# Patient Record
Sex: Female | Born: 1935 | Race: White | Hispanic: No | Smoking: Never smoker
Health system: Southern US, Community
[De-identification: ages and names within clinical notes are randomized; demographics above are authoritative.]

## PROBLEM LIST (undated history)

## (undated) DIAGNOSIS — I10 Essential (primary) hypertension: Secondary | ICD-10-CM

## (undated) DIAGNOSIS — M545 Low back pain, unspecified: Secondary | ICD-10-CM

## (undated) DIAGNOSIS — E559 Vitamin D deficiency, unspecified: Secondary | ICD-10-CM

## (undated) DIAGNOSIS — C801 Malignant (primary) neoplasm, unspecified: Secondary | ICD-10-CM

## (undated) DIAGNOSIS — K5731 Diverticulosis of large intestine without perforation or abscess with bleeding: Secondary | ICD-10-CM

## (undated) DIAGNOSIS — F411 Generalized anxiety disorder: Secondary | ICD-10-CM

## (undated) DIAGNOSIS — F3289 Other specified depressive episodes: Secondary | ICD-10-CM

## (undated) DIAGNOSIS — E079 Disorder of thyroid, unspecified: Secondary | ICD-10-CM

## (undated) DIAGNOSIS — Z9289 Personal history of other medical treatment: Secondary | ICD-10-CM

## (undated) DIAGNOSIS — K5730 Diverticulosis of large intestine without perforation or abscess without bleeding: Secondary | ICD-10-CM

## (undated) DIAGNOSIS — C499 Malignant neoplasm of connective and soft tissue, unspecified: Secondary | ICD-10-CM

## (undated) DIAGNOSIS — N259 Disorder resulting from impaired renal tubular function, unspecified: Secondary | ICD-10-CM

## (undated) DIAGNOSIS — D649 Anemia, unspecified: Secondary | ICD-10-CM

## (undated) DIAGNOSIS — E785 Hyperlipidemia, unspecified: Secondary | ICD-10-CM

## (undated) DIAGNOSIS — I219 Acute myocardial infarction, unspecified: Secondary | ICD-10-CM

## (undated) DIAGNOSIS — F329 Major depressive disorder, single episode, unspecified: Secondary | ICD-10-CM

## (undated) DIAGNOSIS — J45909 Unspecified asthma, uncomplicated: Secondary | ICD-10-CM

## (undated) DIAGNOSIS — M869 Osteomyelitis, unspecified: Secondary | ICD-10-CM

## (undated) DIAGNOSIS — G4700 Insomnia, unspecified: Secondary | ICD-10-CM

## (undated) DIAGNOSIS — K219 Gastro-esophageal reflux disease without esophagitis: Secondary | ICD-10-CM

## (undated) HISTORY — DX: Osteomyelitis, unspecified: M86.9

## (undated) HISTORY — DX: Other specified depressive episodes: F32.89

## (undated) HISTORY — DX: Acute myocardial infarction, unspecified: I21.9

## (undated) HISTORY — DX: Personal history of other medical treatment: Z92.89

## (undated) HISTORY — DX: Vitamin D deficiency, unspecified: E55.9

## (undated) HISTORY — DX: Unspecified asthma, uncomplicated: J45.909

## (undated) HISTORY — DX: Disorder of thyroid, unspecified: E07.9

## (undated) HISTORY — DX: Hyperlipidemia, unspecified: E78.5

## (undated) HISTORY — DX: Gastro-esophageal reflux disease without esophagitis: K21.9

## (undated) HISTORY — PX: OTHER SURGICAL HISTORY: SHX169

## (undated) HISTORY — DX: Generalized anxiety disorder: F41.1

## (undated) HISTORY — DX: Low back pain, unspecified: M54.50

## (undated) HISTORY — DX: Low back pain: M54.5

## (undated) HISTORY — DX: Malignant (primary) neoplasm, unspecified: C80.1

## (undated) HISTORY — DX: Disorder resulting from impaired renal tubular function, unspecified: N25.9

## (undated) HISTORY — DX: Diverticulosis of large intestine without perforation or abscess with bleeding: K57.31

## (undated) HISTORY — DX: Insomnia, unspecified: G47.00

## (undated) HISTORY — DX: Anemia, unspecified: D64.9

## (undated) HISTORY — DX: Diverticulosis of large intestine without perforation or abscess without bleeding: K57.30

## (undated) HISTORY — DX: Essential (primary) hypertension: I10

## (undated) HISTORY — PX: BONE MARROW BIOPSY: SHX199

## (undated) HISTORY — DX: Major depressive disorder, single episode, unspecified: F32.9

## (undated) HISTORY — DX: Malignant neoplasm of connective and soft tissue, unspecified: C49.9

---

## 1993-01-21 HISTORY — PX: PARTIAL COLECTOMY: SHX5273

## 1998-07-20 ENCOUNTER — Other Ambulatory Visit: Admission: RE | Admit: 1998-07-20 | Discharge: 1998-07-20 | Payer: Self-pay | Admitting: Obstetrics and Gynecology

## 1998-07-20 ENCOUNTER — Encounter (INDEPENDENT_AMBULATORY_CARE_PROVIDER_SITE_OTHER): Payer: Self-pay | Admitting: Specialist

## 1999-08-24 ENCOUNTER — Encounter: Admission: RE | Admit: 1999-08-24 | Discharge: 1999-08-24 | Payer: Self-pay | Admitting: Obstetrics and Gynecology

## 1999-08-24 ENCOUNTER — Encounter: Payer: Self-pay | Admitting: Obstetrics and Gynecology

## 2000-09-04 ENCOUNTER — Encounter: Admission: RE | Admit: 2000-09-04 | Discharge: 2000-09-04 | Payer: Self-pay | Admitting: Obstetrics and Gynecology

## 2000-09-04 ENCOUNTER — Encounter: Payer: Self-pay | Admitting: Obstetrics and Gynecology

## 2001-08-28 ENCOUNTER — Encounter: Payer: Self-pay | Admitting: Obstetrics and Gynecology

## 2001-08-28 ENCOUNTER — Encounter: Admission: RE | Admit: 2001-08-28 | Discharge: 2001-08-28 | Payer: Self-pay | Admitting: Obstetrics and Gynecology

## 2001-10-13 ENCOUNTER — Ambulatory Visit (HOSPITAL_COMMUNITY): Admission: RE | Admit: 2001-10-13 | Discharge: 2001-10-13 | Payer: Self-pay | Admitting: Gastroenterology

## 2002-09-02 ENCOUNTER — Encounter: Payer: Self-pay | Admitting: Obstetrics and Gynecology

## 2002-09-02 ENCOUNTER — Encounter: Admission: RE | Admit: 2002-09-02 | Discharge: 2002-09-02 | Payer: Self-pay | Admitting: Obstetrics and Gynecology

## 2003-09-20 ENCOUNTER — Encounter: Admission: RE | Admit: 2003-09-20 | Discharge: 2003-09-20 | Payer: Self-pay | Admitting: Obstetrics and Gynecology

## 2003-11-23 ENCOUNTER — Ambulatory Visit: Payer: Self-pay | Admitting: Internal Medicine

## 2003-11-30 ENCOUNTER — Ambulatory Visit: Payer: Self-pay | Admitting: Internal Medicine

## 2003-12-12 ENCOUNTER — Ambulatory Visit: Payer: Self-pay

## 2004-01-02 ENCOUNTER — Ambulatory Visit: Payer: Self-pay | Admitting: Cardiovascular Disease

## 2004-05-17 ENCOUNTER — Ambulatory Visit: Payer: Self-pay | Admitting: Cardiovascular Disease

## 2004-05-22 ENCOUNTER — Ambulatory Visit: Payer: Self-pay | Admitting: Cardiovascular Disease

## 2004-05-22 ENCOUNTER — Ambulatory Visit: Payer: Self-pay | Admitting: Internal Medicine

## 2004-11-01 ENCOUNTER — Encounter: Admission: RE | Admit: 2004-11-01 | Discharge: 2004-11-01 | Payer: Self-pay | Admitting: Obstetrics and Gynecology

## 2004-12-06 ENCOUNTER — Ambulatory Visit: Payer: Self-pay | Admitting: Cardiovascular Disease

## 2005-02-12 ENCOUNTER — Ambulatory Visit: Payer: Self-pay | Admitting: Internal Medicine

## 2005-02-14 ENCOUNTER — Ambulatory Visit: Payer: Self-pay

## 2005-02-14 ENCOUNTER — Ambulatory Visit: Payer: Self-pay | Admitting: Cardiovascular Disease

## 2005-03-01 ENCOUNTER — Ambulatory Visit (HOSPITAL_COMMUNITY): Admission: RE | Admit: 2005-03-01 | Discharge: 2005-03-01 | Payer: Self-pay | Admitting: *Deleted

## 2005-03-27 ENCOUNTER — Ambulatory Visit: Payer: Self-pay | Admitting: Internal Medicine

## 2005-04-01 ENCOUNTER — Ambulatory Visit: Payer: Self-pay | Admitting: Internal Medicine

## 2005-08-12 ENCOUNTER — Ambulatory Visit: Payer: Self-pay | Admitting: Internal Medicine

## 2005-08-23 ENCOUNTER — Ambulatory Visit: Payer: Self-pay | Admitting: Cardiovascular Disease

## 2005-09-16 ENCOUNTER — Ambulatory Visit: Payer: Self-pay | Admitting: Cardiovascular Disease

## 2005-11-04 ENCOUNTER — Encounter: Admission: RE | Admit: 2005-11-04 | Discharge: 2005-11-04 | Payer: Self-pay | Admitting: Obstetrics and Gynecology

## 2006-03-13 ENCOUNTER — Ambulatory Visit: Payer: Self-pay | Admitting: *Deleted

## 2006-10-16 ENCOUNTER — Ambulatory Visit: Payer: Self-pay | Admitting: *Deleted

## 2006-11-11 ENCOUNTER — Encounter: Admission: RE | Admit: 2006-11-11 | Discharge: 2006-11-11 | Payer: Self-pay | Admitting: Obstetrics and Gynecology

## 2006-11-14 ENCOUNTER — Ambulatory Visit: Payer: Self-pay | Admitting: Cardiovascular Disease

## 2006-11-18 ENCOUNTER — Ambulatory Visit: Payer: Self-pay

## 2006-12-01 ENCOUNTER — Inpatient Hospital Stay (HOSPITAL_COMMUNITY): Admission: RE | Admit: 2006-12-01 | Discharge: 2006-12-03 | Payer: Self-pay | Admitting: *Deleted

## 2006-12-01 ENCOUNTER — Ambulatory Visit: Payer: Self-pay | Admitting: *Deleted

## 2006-12-01 ENCOUNTER — Encounter (INDEPENDENT_AMBULATORY_CARE_PROVIDER_SITE_OTHER): Payer: Self-pay | Admitting: *Deleted

## 2006-12-11 ENCOUNTER — Ambulatory Visit: Payer: Self-pay | Admitting: *Deleted

## 2007-03-20 ENCOUNTER — Ambulatory Visit: Payer: Self-pay | Admitting: Cardiovascular Disease

## 2007-03-20 LAB — CONVERTED CEMR LAB
BUN: 35 mg/dL — ABNORMAL HIGH (ref 6–23)
Basophils Relative: 1 % (ref 0.0–1.0)
CO2: 28 meq/L (ref 19–32)
Calcium: 9.7 mg/dL (ref 8.4–10.5)
Chloride: 100 meq/L (ref 96–112)
GFR calc Af Amer: 47 mL/min
GFR calc non Af Amer: 39 mL/min
Lymphocytes Relative: 18.2 % (ref 12.0–46.0)
Monocytes Relative: 6 % (ref 3.0–11.0)
Neutro Abs: 8.5 10*3/uL — ABNORMAL HIGH (ref 1.4–7.7)
Platelets: 544 10*3/uL — ABNORMAL HIGH (ref 150–400)
RBC: 3.65 M/uL — ABNORMAL LOW (ref 3.87–5.11)

## 2007-03-25 ENCOUNTER — Ambulatory Visit: Payer: Self-pay | Admitting: Cardiology

## 2007-04-07 ENCOUNTER — Encounter: Payer: Self-pay | Admitting: Internal Medicine

## 2007-04-15 ENCOUNTER — Ambulatory Visit: Payer: Self-pay | Admitting: Oncology

## 2007-04-28 ENCOUNTER — Encounter: Payer: Self-pay | Admitting: Internal Medicine

## 2007-04-28 LAB — CBC & DIFF AND RETIC
BASO%: 0.5 % (ref 0.0–2.0)
LYMPH%: 25.2 % (ref 14.0–48.0)
MCH: 31.9 pg (ref 26.0–34.0)
MCHC: 35.7 g/dL (ref 32.0–36.0)
MCV: 89.3 fL (ref 81.0–101.0)
MONO%: 6.7 % (ref 0.0–13.0)
Platelets: 489 10*3/uL — ABNORMAL HIGH (ref 145–400)
RBC: 3.35 10*6/uL — ABNORMAL LOW (ref 3.70–5.32)
Retic %: 1.5 % (ref 0.4–2.3)

## 2007-04-28 LAB — IRON AND TIBC
%SAT: 30 % (ref 20–55)
TIBC: 345 ug/dL (ref 250–470)

## 2007-04-29 LAB — COMPREHENSIVE METABOLIC PANEL
Albumin: 4.1 g/dL (ref 3.5–5.2)
Alkaline Phosphatase: 100 U/L (ref 39–117)
BUN: 32 mg/dL — ABNORMAL HIGH (ref 6–23)
Glucose, Bld: 116 mg/dL — ABNORMAL HIGH (ref 70–99)
Total Bilirubin: 0.5 mg/dL (ref 0.3–1.2)

## 2007-04-29 LAB — LACTATE DEHYDROGENASE: LDH: 231 U/L (ref 94–250)

## 2007-04-30 LAB — TRANSFERRIN RECEPTOR, SOLUABLE: Transferrin Receptor, Soluble: 15.2 nmol/L

## 2007-05-01 LAB — IMMUNOFIXATION ELECTROPHORESIS
IgM, Serum: 75 mg/dL (ref 60–263)
Total Protein, Serum Electrophoresis: 7 g/dL (ref 6.0–8.3)

## 2007-05-12 ENCOUNTER — Emergency Department (HOSPITAL_COMMUNITY): Admission: EM | Admit: 2007-05-12 | Discharge: 2007-05-12 | Payer: Self-pay | Admitting: Emergency Medicine

## 2007-05-14 ENCOUNTER — Ambulatory Visit: Payer: Self-pay | Admitting: Oncology

## 2007-05-14 ENCOUNTER — Encounter (HOSPITAL_COMMUNITY): Payer: Self-pay | Admitting: Oncology

## 2007-05-14 ENCOUNTER — Ambulatory Visit (HOSPITAL_COMMUNITY): Admission: RE | Admit: 2007-05-14 | Discharge: 2007-05-14 | Payer: Self-pay | Admitting: Oncology

## 2007-05-18 ENCOUNTER — Ambulatory Visit: Payer: Self-pay | Admitting: Cardiovascular Disease

## 2007-05-28 ENCOUNTER — Encounter: Payer: Self-pay | Admitting: Internal Medicine

## 2007-05-28 LAB — COMPREHENSIVE METABOLIC PANEL
ALT: 18 U/L (ref 0–35)
AST: 23 U/L (ref 0–37)
Calcium: 9.2 mg/dL (ref 8.4–10.5)
Chloride: 101 mEq/L (ref 96–112)
Creatinine, Ser: 1.3 mg/dL — ABNORMAL HIGH (ref 0.40–1.20)
Potassium: 3.8 mEq/L (ref 3.5–5.3)

## 2007-05-28 LAB — CBC WITH DIFFERENTIAL/PLATELET
BASO%: 0.3 % (ref 0.0–2.0)
Basophils Absolute: 0 10*3/uL (ref 0.0–0.1)
HCT: 29.2 % — ABNORMAL LOW (ref 34.8–46.6)
HGB: 10.1 g/dL — ABNORMAL LOW (ref 11.6–15.9)
LYMPH%: 24.1 % (ref 14.0–48.0)
MCH: 31.2 pg (ref 26.0–34.0)
MCHC: 34.7 g/dL (ref 32.0–36.0)
MONO#: 0.7 10*3/uL (ref 0.1–0.9)
NEUT%: 65.2 % (ref 39.6–76.8)
Platelets: 528 10*3/uL — ABNORMAL HIGH (ref 145–400)
WBC: 9 10*3/uL (ref 3.9–10.0)
lymph#: 2.2 10*3/uL (ref 0.9–3.3)

## 2007-06-09 ENCOUNTER — Ambulatory Visit: Payer: Self-pay | Admitting: Oncology

## 2007-06-11 LAB — CBC WITH DIFFERENTIAL/PLATELET
Basophils Absolute: 0 10*3/uL (ref 0.0–0.1)
EOS%: 4.9 % (ref 0.0–7.0)
HCT: 33.7 % — ABNORMAL LOW (ref 34.8–46.6)
HGB: 11.5 g/dL — ABNORMAL LOW (ref 11.6–15.9)
MCH: 31 pg (ref 26.0–34.0)
MCV: 90.7 fL (ref 81.0–101.0)
MONO%: 8.4 % (ref 0.0–13.0)
NEUT%: 57 % (ref 39.6–76.8)
RDW: 13.9 % (ref 11.3–14.5)

## 2007-06-18 ENCOUNTER — Ambulatory Visit: Payer: Self-pay | Admitting: *Deleted

## 2007-06-23 ENCOUNTER — Ambulatory Visit: Payer: Self-pay | Admitting: Internal Medicine

## 2007-06-23 DIAGNOSIS — M545 Low back pain: Secondary | ICD-10-CM

## 2007-06-23 DIAGNOSIS — E785 Hyperlipidemia, unspecified: Secondary | ICD-10-CM | POA: Insufficient documentation

## 2007-06-23 DIAGNOSIS — K5730 Diverticulosis of large intestine without perforation or abscess without bleeding: Secondary | ICD-10-CM | POA: Insufficient documentation

## 2007-06-23 DIAGNOSIS — G4700 Insomnia, unspecified: Secondary | ICD-10-CM

## 2007-06-23 DIAGNOSIS — F411 Generalized anxiety disorder: Secondary | ICD-10-CM

## 2007-06-23 DIAGNOSIS — I739 Peripheral vascular disease, unspecified: Secondary | ICD-10-CM | POA: Insufficient documentation

## 2007-06-23 DIAGNOSIS — K219 Gastro-esophageal reflux disease without esophagitis: Secondary | ICD-10-CM

## 2007-06-23 DIAGNOSIS — I10 Essential (primary) hypertension: Secondary | ICD-10-CM | POA: Insufficient documentation

## 2007-06-23 DIAGNOSIS — D649 Anemia, unspecified: Secondary | ICD-10-CM | POA: Insufficient documentation

## 2007-06-25 ENCOUNTER — Ambulatory Visit: Payer: Self-pay | Admitting: *Deleted

## 2007-06-25 LAB — CBC WITH DIFFERENTIAL/PLATELET
BASO%: 0.6 % (ref 0.0–2.0)
Basophils Absolute: 0 10*3/uL (ref 0.0–0.1)
EOS%: 5.2 % (ref 0.0–7.0)
HCT: 37.5 % (ref 34.8–46.6)
HGB: 12.6 g/dL (ref 11.6–15.9)
LYMPH%: 34.7 % (ref 14.0–48.0)
MCH: 30.2 pg (ref 26.0–34.0)
MCHC: 33.6 g/dL (ref 32.0–36.0)
MCV: 90 fL (ref 81.0–101.0)
NEUT%: 48.5 % (ref 39.6–76.8)
Platelets: 516 10*3/uL — ABNORMAL HIGH (ref 145–400)

## 2007-07-09 LAB — CBC WITH DIFFERENTIAL/PLATELET
BASO%: 0.2 % (ref 0.0–2.0)
Basophils Absolute: 0 10*3/uL (ref 0.0–0.1)
EOS%: 2.6 % (ref 0.0–7.0)
HCT: 35 % (ref 34.8–46.6)
MCH: 30.4 pg (ref 26.0–34.0)
MCHC: 33.7 g/dL (ref 32.0–36.0)
MCV: 90.3 fL (ref 81.0–101.0)
MONO%: 6.6 % (ref 0.0–13.0)
NEUT%: 63.5 % (ref 39.6–76.8)
RDW: 13.6 % (ref 11.3–14.5)
lymph#: 1.8 10*3/uL (ref 0.9–3.3)

## 2007-07-09 LAB — COMPREHENSIVE METABOLIC PANEL
ALT: 16 U/L (ref 0–35)
Alkaline Phosphatase: 85 U/L (ref 39–117)
Creatinine, Ser: 1.42 mg/dL — ABNORMAL HIGH (ref 0.40–1.20)
Sodium: 139 mEq/L (ref 135–145)
Total Bilirubin: 0.5 mg/dL (ref 0.3–1.2)
Total Protein: 6.9 g/dL (ref 6.0–8.3)

## 2007-07-27 ENCOUNTER — Ambulatory Visit: Payer: Self-pay | Admitting: Oncology

## 2007-07-30 LAB — CBC WITH DIFFERENTIAL/PLATELET
Eosinophils Absolute: 0.2 10*3/uL (ref 0.0–0.5)
LYMPH%: 26.1 % (ref 14.0–48.0)
MONO#: 0.6 10*3/uL (ref 0.1–0.9)
NEUT#: 4.6 10*3/uL (ref 1.5–6.5)
Platelets: 407 10*3/uL — ABNORMAL HIGH (ref 145–400)
RBC: 4.37 10*6/uL (ref 3.70–5.32)
WBC: 7.3 10*3/uL (ref 3.9–10.0)
lymph#: 1.9 10*3/uL (ref 0.9–3.3)

## 2007-08-20 LAB — CBC WITH DIFFERENTIAL/PLATELET
Basophils Absolute: 0 10*3/uL (ref 0.0–0.1)
Eosinophils Absolute: 0.2 10*3/uL (ref 0.0–0.5)
HCT: 34 % — ABNORMAL LOW (ref 34.8–46.6)
HGB: 11.7 g/dL (ref 11.6–15.9)
LYMPH%: 28.9 % (ref 14.0–48.0)
MCHC: 34.5 g/dL (ref 32.0–36.0)
MONO#: 0.6 10*3/uL (ref 0.1–0.9)
NEUT#: 3.7 10*3/uL (ref 1.5–6.5)
NEUT%: 58.3 % (ref 39.6–76.8)
Platelets: 406 10*3/uL — ABNORMAL HIGH (ref 145–400)
WBC: 6.4 10*3/uL (ref 3.9–10.0)

## 2007-09-10 ENCOUNTER — Encounter: Payer: Self-pay | Admitting: Internal Medicine

## 2007-09-10 LAB — CBC WITH DIFFERENTIAL/PLATELET
Basophils Absolute: 0.1 10*3/uL (ref 0.0–0.1)
EOS%: 3 % (ref 0.0–7.0)
Eosinophils Absolute: 0.2 10*3/uL (ref 0.0–0.5)
LYMPH%: 29.5 % (ref 14.0–48.0)
MCH: 29.4 pg (ref 26.0–34.0)
MCV: 87.2 fL (ref 81.0–101.0)
MONO%: 10.2 % (ref 0.0–13.0)
NEUT#: 2.9 10*3/uL (ref 1.5–6.5)
Platelets: 443 10*3/uL — ABNORMAL HIGH (ref 145–400)
RBC: 4.47 10*6/uL (ref 3.70–5.32)
RDW: 15.5 % — ABNORMAL HIGH (ref 11.3–14.5)

## 2007-09-10 LAB — COMPREHENSIVE METABOLIC PANEL
Albumin: 4 g/dL (ref 3.5–5.2)
Alkaline Phosphatase: 94 U/L (ref 39–117)
BUN: 31 mg/dL — ABNORMAL HIGH (ref 6–23)
Glucose, Bld: 103 mg/dL — ABNORMAL HIGH (ref 70–99)
Potassium: 4.1 mEq/L (ref 3.5–5.3)
Total Bilirubin: 0.5 mg/dL (ref 0.3–1.2)

## 2007-09-29 ENCOUNTER — Ambulatory Visit: Payer: Self-pay | Admitting: Oncology

## 2007-10-01 LAB — CBC WITH DIFFERENTIAL/PLATELET
Basophils Absolute: 0 10*3/uL (ref 0.0–0.1)
EOS%: 4.5 % (ref 0.0–7.0)
Eosinophils Absolute: 0.3 10*3/uL (ref 0.0–0.5)
HCT: 35.7 % (ref 34.8–46.6)
HGB: 12.3 g/dL (ref 11.6–15.9)
LYMPH%: 27.6 % (ref 14.0–48.0)
MCH: 29.8 pg (ref 26.0–34.0)
MCV: 86.4 fL (ref 81.0–101.0)
MONO%: 6.5 % (ref 0.0–13.0)
NEUT#: 3.7 10*3/uL (ref 1.5–6.5)
NEUT%: 60.8 % (ref 39.6–76.8)
Platelets: 464 10*3/uL — ABNORMAL HIGH (ref 145–400)
RDW: 15.4 % — ABNORMAL HIGH (ref 11.3–14.5)

## 2007-10-22 LAB — CBC WITH DIFFERENTIAL/PLATELET
Eosinophils Absolute: 0.2 10*3/uL (ref 0.0–0.5)
HGB: 11.6 g/dL (ref 11.6–15.9)
MONO#: 0.4 10*3/uL (ref 0.1–0.9)
NEUT#: 3 10*3/uL (ref 1.5–6.5)
RBC: 3.89 10*6/uL (ref 3.70–5.32)
RDW: 15.3 % — ABNORMAL HIGH (ref 11.3–14.5)
WBC: 5.3 10*3/uL (ref 3.9–10.0)
lymph#: 1.7 10*3/uL (ref 0.9–3.3)

## 2007-10-28 ENCOUNTER — Ambulatory Visit: Payer: Self-pay | Admitting: Internal Medicine

## 2007-10-28 DIAGNOSIS — R5383 Other fatigue: Secondary | ICD-10-CM

## 2007-10-28 DIAGNOSIS — J45909 Unspecified asthma, uncomplicated: Secondary | ICD-10-CM

## 2007-10-28 DIAGNOSIS — R05 Cough: Secondary | ICD-10-CM

## 2007-10-28 DIAGNOSIS — N12 Tubulo-interstitial nephritis, not specified as acute or chronic: Secondary | ICD-10-CM | POA: Insufficient documentation

## 2007-10-29 LAB — CONVERTED CEMR LAB
Crystals: NEGATIVE
Hemoglobin, Urine: NEGATIVE
Nitrite: NEGATIVE
Total Protein, Urine: NEGATIVE mg/dL
Urobilinogen, UA: 0.2 (ref 0.0–1.0)

## 2007-11-02 ENCOUNTER — Ambulatory Visit: Payer: Self-pay | Admitting: Cardiovascular Disease

## 2007-11-12 ENCOUNTER — Encounter: Payer: Self-pay | Admitting: Internal Medicine

## 2007-11-12 LAB — COMPREHENSIVE METABOLIC PANEL
ALT: 15 U/L (ref 0–35)
AST: 20 U/L (ref 0–37)
Albumin: 3.8 g/dL (ref 3.5–5.2)
CO2: 23 mEq/L (ref 19–32)
Calcium: 9.5 mg/dL (ref 8.4–10.5)
Chloride: 101 mEq/L (ref 96–112)
Creatinine, Ser: 1.47 mg/dL — ABNORMAL HIGH (ref 0.40–1.20)
Potassium: 4.5 mEq/L (ref 3.5–5.3)
Total Protein: 6.8 g/dL (ref 6.0–8.3)

## 2007-11-12 LAB — CBC WITH DIFFERENTIAL/PLATELET
BASO%: 0.7 % (ref 0.0–2.0)
EOS%: 3.1 % (ref 0.0–7.0)
MCH: 30.4 pg (ref 26.0–34.0)
MCHC: 33.8 g/dL (ref 32.0–36.0)
MCV: 89.8 fL (ref 81.0–101.0)
MONO%: 8.6 % (ref 0.0–13.0)
RDW: 15.4 % — ABNORMAL HIGH (ref 11.3–14.5)
lymph#: 1.3 10*3/uL (ref 0.9–3.3)

## 2007-11-12 LAB — FERRITIN: Ferritin: 56 ng/mL (ref 10–291)

## 2007-11-12 LAB — IRON AND TIBC: TIBC: 318 ug/dL (ref 250–470)

## 2007-11-13 ENCOUNTER — Encounter: Admission: RE | Admit: 2007-11-13 | Discharge: 2007-11-13 | Payer: Self-pay | Admitting: Internal Medicine

## 2007-12-01 ENCOUNTER — Ambulatory Visit: Payer: Self-pay | Admitting: Oncology

## 2007-12-03 LAB — CBC WITH DIFFERENTIAL/PLATELET
EOS%: 4.2 % (ref 0.0–7.0)
Eosinophils Absolute: 0.2 10*3/uL (ref 0.0–0.5)
MCH: 30 pg (ref 26.0–34.0)
MCV: 91 fL (ref 81.0–101.0)
MONO%: 8 % (ref 0.0–13.0)
NEUT#: 3.3 10*3/uL (ref 1.5–6.5)
RBC: 4.37 10*6/uL (ref 3.70–5.32)
RDW: 15.3 % — ABNORMAL HIGH (ref 11.3–14.5)
lymph#: 1.6 10*3/uL (ref 0.9–3.3)

## 2007-12-21 ENCOUNTER — Ambulatory Visit: Payer: Self-pay | Admitting: Internal Medicine

## 2007-12-23 LAB — CONVERTED CEMR LAB
Bilirubin, Direct: 0.1 mg/dL (ref 0.0–0.3)
Calcium: 9.2 mg/dL (ref 8.4–10.5)
GFR calc Af Amer: 47 mL/min
Sodium: 139 meq/L (ref 135–145)
TSH: 3.3 microintl units/mL (ref 0.35–5.50)
Total Bilirubin: 0.8 mg/dL (ref 0.3–1.2)
Total Protein: 6.7 g/dL (ref 6.0–8.3)
Vit D, 1,25-Dihydroxy: 23 — ABNORMAL LOW (ref 30–89)

## 2007-12-24 LAB — CBC WITH DIFFERENTIAL/PLATELET
BASO%: 0.9 % (ref 0.0–2.0)
Basophils Absolute: 0 10*3/uL (ref 0.0–0.1)
EOS%: 5.1 % (ref 0.0–7.0)
HCT: 36.4 % (ref 34.8–46.6)
HGB: 12.4 g/dL (ref 11.6–15.9)
LYMPH%: 31.9 % (ref 14.0–48.0)
MCH: 30.3 pg (ref 26.0–34.0)
MCHC: 34 g/dL (ref 32.0–36.0)
MCV: 89 fL (ref 81.0–101.0)
MONO%: 8 % (ref 0.0–13.0)
NEUT%: 54.1 % (ref 39.6–76.8)
Platelets: 435 10*3/uL — ABNORMAL HIGH (ref 145–400)
lymph#: 1.6 10*3/uL (ref 0.9–3.3)

## 2007-12-28 ENCOUNTER — Ambulatory Visit: Payer: Self-pay | Admitting: Internal Medicine

## 2007-12-28 DIAGNOSIS — E559 Vitamin D deficiency, unspecified: Secondary | ICD-10-CM

## 2007-12-31 ENCOUNTER — Ambulatory Visit: Payer: Self-pay | Admitting: *Deleted

## 2008-01-12 ENCOUNTER — Ambulatory Visit: Payer: Self-pay | Admitting: Oncology

## 2008-01-14 LAB — CBC WITH DIFFERENTIAL/PLATELET
BASO%: 0.6 % (ref 0.0–2.0)
Basophils Absolute: 0 10*3/uL (ref 0.0–0.1)
EOS%: 5.9 % (ref 0.0–7.0)
HGB: 11.6 g/dL (ref 11.6–15.9)
MCH: 30.1 pg (ref 26.0–34.0)
MCHC: 34 g/dL (ref 32.0–36.0)
MCV: 88.7 fL (ref 81.0–101.0)
MONO%: 8.1 % (ref 0.0–13.0)
RDW: 13.8 % (ref 11.3–14.5)
lymph#: 1.5 10*3/uL (ref 0.9–3.3)

## 2008-02-03 ENCOUNTER — Encounter: Payer: Self-pay | Admitting: Internal Medicine

## 2008-02-04 ENCOUNTER — Ambulatory Visit: Payer: Self-pay | Admitting: Endocrinology

## 2008-02-04 DIAGNOSIS — IMO0002 Reserved for concepts with insufficient information to code with codable children: Secondary | ICD-10-CM | POA: Insufficient documentation

## 2008-02-04 LAB — CBC WITH DIFFERENTIAL/PLATELET
BASO%: 0.8 % (ref 0.0–2.0)
EOS%: 5 % (ref 0.0–7.0)
HCT: 40.9 % (ref 34.8–46.6)
LYMPH%: 31.9 % (ref 14.0–48.0)
MCH: 30.6 pg (ref 26.0–34.0)
MCHC: 33.6 g/dL (ref 32.0–36.0)
MCV: 91 fL (ref 81.0–101.0)
MONO%: 10.9 % (ref 0.0–13.0)
NEUT%: 51.4 % (ref 39.6–76.8)
Platelets: 415 10*3/uL — ABNORMAL HIGH (ref 145–400)
RBC: 4.49 10*6/uL (ref 3.70–5.32)

## 2008-02-04 LAB — COMPREHENSIVE METABOLIC PANEL
ALT: 15 U/L (ref 0–35)
AST: 20 U/L (ref 0–37)
Alkaline Phosphatase: 104 U/L (ref 39–117)
BUN: 25 mg/dL — ABNORMAL HIGH (ref 6–23)
Calcium: 9.7 mg/dL (ref 8.4–10.5)
Chloride: 102 mEq/L (ref 96–112)
Creatinine, Ser: 1.27 mg/dL — ABNORMAL HIGH (ref 0.40–1.20)
Total Bilirubin: 0.6 mg/dL (ref 0.3–1.2)

## 2008-02-04 LAB — LACTATE DEHYDROGENASE: LDH: 185 U/L (ref 94–250)

## 2008-02-23 ENCOUNTER — Ambulatory Visit: Payer: Self-pay | Admitting: Oncology

## 2008-02-24 ENCOUNTER — Encounter: Payer: Self-pay | Admitting: Internal Medicine

## 2008-02-25 LAB — CBC WITH DIFFERENTIAL/PLATELET
Basophils Absolute: 0 10*3/uL (ref 0.0–0.1)
Eosinophils Absolute: 0.4 10*3/uL (ref 0.0–0.5)
HGB: 12.8 g/dL (ref 11.6–15.9)
LYMPH%: 23.1 % (ref 14.0–48.0)
MCV: 89.5 fL (ref 81.0–101.0)
MONO#: 0.7 10*3/uL (ref 0.1–0.9)
MONO%: 9.3 % (ref 0.0–13.0)
NEUT#: 4.7 10*3/uL (ref 1.5–6.5)
Platelets: 405 10*3/uL — ABNORMAL HIGH (ref 145–400)
RBC: 4.22 10*6/uL (ref 3.70–5.32)
RDW: 14.1 % (ref 11.3–14.5)
WBC: 7.5 10*3/uL (ref 3.9–10.0)

## 2008-03-17 LAB — CBC WITH DIFFERENTIAL/PLATELET
Eosinophils Absolute: 0.2 10*3/uL (ref 0.0–0.5)
HCT: 34.8 % (ref 34.8–46.6)
LYMPH%: 33.7 % (ref 14.0–49.7)
MCV: 89.2 fL (ref 79.5–101.0)
MONO#: 0.5 10*3/uL (ref 0.1–0.9)
MONO%: 8 % (ref 0.0–14.0)
NEUT#: 3.1 10*3/uL (ref 1.5–6.5)
NEUT%: 53.5 % (ref 38.4–76.8)
Platelets: 429 10*3/uL — ABNORMAL HIGH (ref 145–400)
RBC: 3.91 10*6/uL (ref 3.70–5.45)
WBC: 5.9 10*3/uL (ref 3.9–10.3)

## 2008-04-07 LAB — CBC WITH DIFFERENTIAL/PLATELET
BASO%: 0.7 % (ref 0.0–2.0)
Eosinophils Absolute: 0.2 10*3/uL (ref 0.0–0.5)
HCT: 38.2 % (ref 34.8–46.6)
HGB: 12.8 g/dL (ref 11.6–15.9)
MCHC: 33.6 g/dL (ref 31.5–36.0)
MONO#: 0.4 10*3/uL (ref 0.1–0.9)
NEUT#: 3 10*3/uL (ref 1.5–6.5)
NEUT%: 60.9 % (ref 38.4–76.8)
WBC: 5 10*3/uL (ref 3.9–10.3)
lymph#: 1.4 10*3/uL (ref 0.9–3.3)

## 2008-04-11 ENCOUNTER — Ambulatory Visit: Payer: Self-pay | Admitting: Internal Medicine

## 2008-04-11 ENCOUNTER — Telehealth: Payer: Self-pay | Admitting: Internal Medicine

## 2008-04-11 ENCOUNTER — Inpatient Hospital Stay (HOSPITAL_COMMUNITY): Admission: EM | Admit: 2008-04-11 | Discharge: 2008-04-12 | Payer: Self-pay | Admitting: Emergency Medicine

## 2008-04-11 DIAGNOSIS — R55 Syncope and collapse: Secondary | ICD-10-CM

## 2008-04-11 DIAGNOSIS — E860 Dehydration: Secondary | ICD-10-CM

## 2008-04-11 DIAGNOSIS — R197 Diarrhea, unspecified: Secondary | ICD-10-CM

## 2008-04-11 DIAGNOSIS — S0510XA Contusion of eyeball and orbital tissues, unspecified eye, initial encounter: Secondary | ICD-10-CM | POA: Insufficient documentation

## 2008-04-20 ENCOUNTER — Ambulatory Visit: Payer: Self-pay

## 2008-04-26 ENCOUNTER — Ambulatory Visit: Payer: Self-pay | Admitting: Oncology

## 2008-04-26 ENCOUNTER — Ambulatory Visit: Payer: Self-pay | Admitting: Internal Medicine

## 2008-04-27 ENCOUNTER — Encounter: Payer: Self-pay | Admitting: Cardiovascular Disease

## 2008-04-27 ENCOUNTER — Ambulatory Visit: Payer: Self-pay | Admitting: Cardiovascular Disease

## 2008-04-28 LAB — CBC WITH DIFFERENTIAL/PLATELET
BASO%: 0.6 % (ref 0.0–2.0)
Basophils Absolute: 0 10e3/uL (ref 0.0–0.1)
EOS%: 5.2 % (ref 0.0–7.0)
Eosinophils Absolute: 0.3 10e3/uL (ref 0.0–0.5)
HCT: 37.6 % (ref 34.8–46.6)
HGB: 12.8 g/dL (ref 11.6–15.9)
LYMPH%: 29.7 % (ref 14.0–49.7)
MCH: 31.3 pg (ref 25.1–34.0)
MCHC: 34 g/dL (ref 31.5–36.0)
MCV: 91.9 fL (ref 79.5–101.0)
MONO#: 0.5 10e3/uL (ref 0.1–0.9)
MONO%: 8.5 % (ref 0.0–14.0)
NEUT#: 3.2 10e3/uL (ref 1.5–6.5)
NEUT%: 56 % (ref 38.4–76.8)
Platelets: 494 10e3/uL — ABNORMAL HIGH (ref 145–400)
RBC: 4.09 10e6/uL (ref 3.70–5.45)
RDW: 14.1 % (ref 11.2–14.5)
WBC: 5.8 10e3/uL (ref 3.9–10.3)
lymph#: 1.7 10e3/uL (ref 0.9–3.3)

## 2008-05-19 LAB — CBC WITH DIFFERENTIAL/PLATELET
BASO%: 0.7 % (ref 0.0–2.0)
LYMPH%: 35.5 % (ref 14.0–49.7)
MCHC: 34 g/dL (ref 31.5–36.0)
MONO#: 0.4 10*3/uL (ref 0.1–0.9)
NEUT#: 2.7 10*3/uL (ref 1.5–6.5)
RBC: 3.95 10*6/uL (ref 3.70–5.45)
RDW: 14.1 % (ref 11.2–14.5)
WBC: 5.2 10*3/uL (ref 3.9–10.3)
lymph#: 1.8 10*3/uL (ref 0.9–3.3)

## 2008-05-30 ENCOUNTER — Encounter: Payer: Self-pay | Admitting: Internal Medicine

## 2008-06-02 ENCOUNTER — Encounter: Payer: Self-pay | Admitting: Internal Medicine

## 2008-06-02 LAB — CBC WITH DIFFERENTIAL/PLATELET
Basophils Absolute: 0 10*3/uL (ref 0.0–0.1)
Eosinophils Absolute: 0.2 10*3/uL (ref 0.0–0.5)
HCT: 33.4 % — ABNORMAL LOW (ref 34.8–46.6)
HGB: 11.4 g/dL — ABNORMAL LOW (ref 11.6–15.9)
LYMPH%: 28.5 % (ref 14.0–49.7)
MCV: 91.6 fL (ref 79.5–101.0)
MONO#: 0.4 10*3/uL (ref 0.1–0.9)
MONO%: 8.9 % (ref 0.0–14.0)
NEUT#: 2.9 10*3/uL (ref 1.5–6.5)
NEUT%: 58.5 % (ref 38.4–76.8)
Platelets: 411 10*3/uL — ABNORMAL HIGH (ref 145–400)
RBC: 3.65 10*6/uL — ABNORMAL LOW (ref 3.70–5.45)
WBC: 5 10*3/uL (ref 3.9–10.3)

## 2008-06-06 LAB — COMPREHENSIVE METABOLIC PANEL
Alkaline Phosphatase: 92 U/L (ref 39–117)
BUN: 22 mg/dL (ref 6–23)
CO2: 24 mEq/L (ref 19–32)
Glucose, Bld: 89 mg/dL (ref 70–99)
Sodium: 138 mEq/L (ref 135–145)
Total Bilirubin: 0.6 mg/dL (ref 0.3–1.2)
Total Protein: 6.7 g/dL (ref 6.0–8.3)

## 2008-06-06 LAB — IRON AND TIBC
%SAT: 38 % (ref 20–55)
TIBC: 306 ug/dL (ref 250–470)
UIBC: 191 ug/dL

## 2008-06-06 LAB — LACTATE DEHYDROGENASE: LDH: 195 U/L (ref 94–250)

## 2008-06-06 LAB — FERRITIN: Ferritin: 216 ng/mL (ref 10–291)

## 2008-06-28 ENCOUNTER — Ambulatory Visit: Payer: Self-pay | Admitting: Oncology

## 2008-06-30 LAB — CBC WITH DIFFERENTIAL/PLATELET
Basophils Absolute: 0 10*3/uL (ref 0.0–0.1)
Eosinophils Absolute: 0.1 10*3/uL (ref 0.0–0.5)
HGB: 13.2 g/dL (ref 11.6–15.9)
MCV: 93.8 fL (ref 79.5–101.0)
MONO#: 0.2 10*3/uL (ref 0.1–0.9)
MONO%: 3 % (ref 0.0–14.0)
NEUT#: 4.4 10*3/uL (ref 1.5–6.5)
RBC: 4.05 10*6/uL (ref 3.70–5.45)
RDW: 14.4 % (ref 11.2–14.5)
WBC: 6.6 10*3/uL (ref 3.9–10.3)

## 2008-07-14 ENCOUNTER — Telehealth: Payer: Self-pay | Admitting: Internal Medicine

## 2008-07-19 ENCOUNTER — Ambulatory Visit: Payer: Self-pay | Admitting: Internal Medicine

## 2008-07-20 LAB — CONVERTED CEMR LAB
BUN: 22 mg/dL (ref 6–23)
CO2: 29 meq/L (ref 19–32)
Chloride: 101 meq/L (ref 96–112)
Creatinine, Ser: 1.6 mg/dL — ABNORMAL HIGH (ref 0.4–1.2)
Glucose, Bld: 100 mg/dL — ABNORMAL HIGH (ref 70–99)
TSH: 3.96 microintl units/mL (ref 0.35–5.50)

## 2008-07-21 LAB — CBC WITH DIFFERENTIAL/PLATELET
Basophils Absolute: 0 10*3/uL (ref 0.0–0.1)
Eosinophils Absolute: 0.3 10*3/uL (ref 0.0–0.5)
HGB: 12.4 g/dL (ref 11.6–15.9)
MCV: 92.7 fL (ref 79.5–101.0)
MONO#: 0.5 10*3/uL (ref 0.1–0.9)
MONO%: 6.5 % (ref 0.0–14.0)
NEUT#: 5.3 10*3/uL (ref 1.5–6.5)
RBC: 3.88 10*6/uL (ref 3.70–5.45)
RDW: 13.7 % (ref 11.2–14.5)
WBC: 7.9 10*3/uL (ref 3.9–10.3)
lymph#: 1.7 10*3/uL (ref 0.9–3.3)

## 2008-07-22 LAB — CONVERTED CEMR LAB
Bilirubin Urine: NEGATIVE
Urine Glucose: NEGATIVE mg/dL
Urobilinogen, UA: 0.2 (ref 0.0–1.0)

## 2008-07-26 ENCOUNTER — Ambulatory Visit: Payer: Self-pay | Admitting: Internal Medicine

## 2008-08-09 ENCOUNTER — Ambulatory Visit: Payer: Self-pay | Admitting: Oncology

## 2008-08-11 LAB — CBC WITH DIFFERENTIAL/PLATELET
Basophils Absolute: 0.1 10*3/uL (ref 0.0–0.1)
EOS%: 3.4 % (ref 0.0–7.0)
Eosinophils Absolute: 0.2 10*3/uL (ref 0.0–0.5)
HGB: 13.7 g/dL (ref 11.6–15.9)
MCH: 32.3 pg (ref 25.1–34.0)
NEUT#: 4 10*3/uL (ref 1.5–6.5)
RBC: 4.24 10*6/uL (ref 3.70–5.45)
RDW: 15.3 % — ABNORMAL HIGH (ref 11.2–14.5)
lymph#: 1.7 10*3/uL (ref 0.9–3.3)

## 2008-09-01 LAB — CBC WITH DIFFERENTIAL/PLATELET
Basophils Absolute: 0 10*3/uL (ref 0.0–0.1)
EOS%: 2.2 % (ref 0.0–7.0)
HGB: 12.9 g/dL (ref 11.6–15.9)
MCH: 32.1 pg (ref 25.1–34.0)
NEUT#: 7.9 10*3/uL — ABNORMAL HIGH (ref 1.5–6.5)
RBC: 4.02 10*6/uL (ref 3.70–5.45)
RDW: 13.3 % (ref 11.2–14.5)
lymph#: 1.5 10*3/uL (ref 0.9–3.3)

## 2008-09-08 ENCOUNTER — Inpatient Hospital Stay (HOSPITAL_COMMUNITY): Admission: EM | Admit: 2008-09-08 | Discharge: 2008-09-11 | Payer: Self-pay | Admitting: Emergency Medicine

## 2008-09-20 ENCOUNTER — Ambulatory Visit: Payer: Self-pay | Admitting: Internal Medicine

## 2008-09-20 ENCOUNTER — Ambulatory Visit: Payer: Self-pay | Admitting: Oncology

## 2008-09-20 DIAGNOSIS — K5731 Diverticulosis of large intestine without perforation or abscess with bleeding: Secondary | ICD-10-CM

## 2008-09-22 LAB — CBC WITH DIFFERENTIAL/PLATELET
Basophils Absolute: 0 10*3/uL (ref 0.0–0.1)
EOS%: 4.7 % (ref 0.0–7.0)
Eosinophils Absolute: 0.3 10*3/uL (ref 0.0–0.5)
LYMPH%: 27.5 % (ref 14.0–49.7)
MCH: 31.1 pg (ref 25.1–34.0)
MCV: 91.1 fL (ref 79.5–101.0)
MONO%: 9.3 % (ref 0.0–14.0)
NEUT#: 3.3 10*3/uL (ref 1.5–6.5)
Platelets: 450 10*3/uL — ABNORMAL HIGH (ref 145–400)
RBC: 3.58 10*6/uL — ABNORMAL LOW (ref 3.70–5.45)
RDW: 13.9 % (ref 11.2–14.5)

## 2008-09-27 ENCOUNTER — Encounter: Payer: Self-pay | Admitting: Internal Medicine

## 2008-09-29 ENCOUNTER — Encounter (INDEPENDENT_AMBULATORY_CARE_PROVIDER_SITE_OTHER): Payer: Self-pay | Admitting: *Deleted

## 2008-10-13 ENCOUNTER — Encounter: Payer: Self-pay | Admitting: Internal Medicine

## 2008-10-13 LAB — CBC WITH DIFFERENTIAL/PLATELET
BASO%: 0.5 % (ref 0.0–2.0)
EOS%: 3 % (ref 0.0–7.0)
LYMPH%: 26.9 % (ref 14.0–49.7)
MCHC: 33.8 g/dL (ref 31.5–36.0)
MCV: 94.9 fL (ref 79.5–101.0)
MONO%: 9.6 % (ref 0.0–14.0)
Platelets: 406 10*3/uL — ABNORMAL HIGH (ref 145–400)
RBC: 3.98 10*6/uL (ref 3.70–5.45)
RDW: 16.4 % — ABNORMAL HIGH (ref 11.2–14.5)

## 2008-10-17 LAB — COMPREHENSIVE METABOLIC PANEL
ALT: 16 U/L (ref 0–35)
AST: 22 U/L (ref 0–37)
Albumin: 3.9 g/dL (ref 3.5–5.2)
Alkaline Phosphatase: 81 U/L (ref 39–117)
Potassium: 4.2 mEq/L (ref 3.5–5.3)
Sodium: 137 mEq/L (ref 135–145)
Total Bilirubin: 0.7 mg/dL (ref 0.3–1.2)
Total Protein: 6.8 g/dL (ref 6.0–8.3)

## 2008-10-17 LAB — IRON AND TIBC
%SAT: 52 % (ref 20–55)
TIBC: 307 ug/dL (ref 250–470)

## 2008-10-17 LAB — TRANSFERRIN RECEPTOR, SOLUABLE: Transferrin Receptor, Soluble: 25.9 nmol/L

## 2008-10-18 ENCOUNTER — Telehealth: Payer: Self-pay | Admitting: Internal Medicine

## 2008-10-20 ENCOUNTER — Encounter: Payer: Self-pay | Admitting: Internal Medicine

## 2008-10-21 ENCOUNTER — Encounter: Admission: RE | Admit: 2008-10-21 | Discharge: 2008-10-21 | Payer: Self-pay | Admitting: Gastroenterology

## 2008-10-21 DIAGNOSIS — N259 Disorder resulting from impaired renal tubular function, unspecified: Secondary | ICD-10-CM

## 2008-10-21 DIAGNOSIS — K859 Acute pancreatitis without necrosis or infection, unspecified: Secondary | ICD-10-CM | POA: Insufficient documentation

## 2008-10-21 DIAGNOSIS — I2510 Atherosclerotic heart disease of native coronary artery without angina pectoris: Secondary | ICD-10-CM

## 2008-10-25 ENCOUNTER — Ambulatory Visit: Payer: Self-pay | Admitting: Cardiovascular Disease

## 2008-11-01 ENCOUNTER — Ambulatory Visit: Payer: Self-pay | Admitting: Oncology

## 2008-11-01 ENCOUNTER — Encounter: Payer: Self-pay | Admitting: Cardiovascular Disease

## 2008-11-03 LAB — CBC WITH DIFFERENTIAL/PLATELET
Basophils Absolute: 0 10*3/uL (ref 0.0–0.1)
EOS%: 3.5 % (ref 0.0–7.0)
Eosinophils Absolute: 0.2 10*3/uL (ref 0.0–0.5)
HCT: 37.8 % (ref 34.8–46.6)
HGB: 13 g/dL (ref 11.6–15.9)
MCH: 32 pg (ref 25.1–34.0)
MCV: 93.1 fL (ref 79.5–101.0)
MONO%: 9.7 % (ref 0.0–14.0)
NEUT#: 3 10*3/uL (ref 1.5–6.5)
NEUT%: 54.6 % (ref 38.4–76.8)
Platelets: 413 10*3/uL — ABNORMAL HIGH (ref 145–400)
RDW: 14.4 % (ref 11.2–14.5)

## 2008-11-07 ENCOUNTER — Encounter: Payer: Self-pay | Admitting: Internal Medicine

## 2008-11-14 ENCOUNTER — Encounter: Admission: RE | Admit: 2008-11-14 | Discharge: 2008-11-14 | Payer: Self-pay | Admitting: Internal Medicine

## 2008-11-24 LAB — CBC WITH DIFFERENTIAL/PLATELET
Basophils Absolute: 0 10*3/uL (ref 0.0–0.1)
Eosinophils Absolute: 0.2 10*3/uL (ref 0.0–0.5)
HCT: 34.5 % — ABNORMAL LOW (ref 34.8–46.6)
HGB: 11.8 g/dL (ref 11.6–15.9)
MCV: 94.9 fL (ref 79.5–101.0)
MONO%: 10.9 % (ref 0.0–14.0)
NEUT#: 2.9 10*3/uL (ref 1.5–6.5)
NEUT%: 53.7 % (ref 38.4–76.8)
Platelets: 395 10*3/uL (ref 145–400)
RDW: 14 % (ref 11.2–14.5)

## 2008-12-05 ENCOUNTER — Telehealth: Payer: Self-pay | Admitting: Internal Medicine

## 2008-12-08 ENCOUNTER — Ambulatory Visit (HOSPITAL_COMMUNITY): Admission: RE | Admit: 2008-12-08 | Discharge: 2008-12-08 | Payer: Self-pay | Admitting: General Surgery

## 2008-12-16 ENCOUNTER — Ambulatory Visit: Payer: Self-pay | Admitting: Oncology

## 2008-12-20 LAB — CBC WITH DIFFERENTIAL/PLATELET
Basophils Absolute: 0 10*3/uL (ref 0.0–0.1)
Eosinophils Absolute: 0.2 10*3/uL (ref 0.0–0.5)
HCT: 41.6 % (ref 34.8–46.6)
HGB: 14.1 g/dL (ref 11.6–15.9)
MCH: 32 pg (ref 25.1–34.0)
MONO#: 0.5 10*3/uL (ref 0.1–0.9)
NEUT#: 3.5 10*3/uL (ref 1.5–6.5)
NEUT%: 58.2 % (ref 38.4–76.8)
RDW: 14.2 % (ref 11.2–14.5)
WBC: 6.1 10*3/uL (ref 3.9–10.3)
lymph#: 1.8 10*3/uL (ref 0.9–3.3)

## 2008-12-27 ENCOUNTER — Ambulatory Visit: Payer: Self-pay | Admitting: Internal Medicine

## 2009-01-02 ENCOUNTER — Encounter: Payer: Self-pay | Admitting: Internal Medicine

## 2009-01-10 LAB — CBC WITH DIFFERENTIAL/PLATELET
Basophils Absolute: 0 10*3/uL (ref 0.0–0.1)
EOS%: 2.8 % (ref 0.0–7.0)
Eosinophils Absolute: 0.2 10*3/uL (ref 0.0–0.5)
HCT: 40.2 % (ref 34.8–46.6)
HGB: 13.7 g/dL (ref 11.6–15.9)
MONO#: 0.5 10*3/uL (ref 0.1–0.9)
NEUT#: 3.3 10*3/uL (ref 1.5–6.5)
RDW: 13.5 % (ref 11.2–14.5)
WBC: 6.1 10*3/uL (ref 3.9–10.3)
lymph#: 2.1 10*3/uL (ref 0.9–3.3)

## 2009-01-12 ENCOUNTER — Encounter: Payer: Self-pay | Admitting: Cardiovascular Disease

## 2009-01-12 ENCOUNTER — Ambulatory Visit: Payer: Self-pay

## 2009-01-12 DIAGNOSIS — R0989 Other specified symptoms and signs involving the circulatory and respiratory systems: Secondary | ICD-10-CM

## 2009-01-27 ENCOUNTER — Ambulatory Visit: Payer: Self-pay | Admitting: Oncology

## 2009-01-30 LAB — CBC WITH DIFFERENTIAL/PLATELET
Basophils Absolute: 0.1 10*3/uL (ref 0.0–0.1)
Eosinophils Absolute: 0.2 10*3/uL (ref 0.0–0.5)
HGB: 13.1 g/dL (ref 11.6–15.9)
LYMPH%: 30.8 % (ref 14.0–49.7)
MCV: 91.2 fL (ref 79.5–101.0)
MONO%: 8.3 % (ref 0.0–14.0)
NEUT#: 4.2 10*3/uL (ref 1.5–6.5)
Platelets: 385 10*3/uL (ref 145–400)

## 2009-02-13 ENCOUNTER — Encounter: Payer: Self-pay | Admitting: Cardiovascular Disease

## 2009-02-14 ENCOUNTER — Ambulatory Visit: Payer: Self-pay | Admitting: Internal Medicine

## 2009-02-14 DIAGNOSIS — J040 Acute laryngitis: Secondary | ICD-10-CM | POA: Insufficient documentation

## 2009-02-21 LAB — CBC WITH DIFFERENTIAL/PLATELET
BASO%: 0.5 % (ref 0.0–2.0)
EOS%: 2.9 % (ref 0.0–7.0)
MCH: 31.8 pg (ref 25.1–34.0)
MCHC: 34.6 g/dL (ref 31.5–36.0)
MONO#: 0.6 10*3/uL (ref 0.1–0.9)
NEUT%: 54.6 % (ref 38.4–76.8)
RBC: 3.58 10*6/uL — ABNORMAL LOW (ref 3.70–5.45)
WBC: 6.2 10*3/uL (ref 3.9–10.3)
lymph#: 2 10*3/uL (ref 0.9–3.3)

## 2009-02-22 ENCOUNTER — Telehealth: Payer: Self-pay | Admitting: Cardiovascular Disease

## 2009-03-06 ENCOUNTER — Encounter: Payer: Self-pay | Admitting: Internal Medicine

## 2009-03-10 ENCOUNTER — Ambulatory Visit: Payer: Self-pay | Admitting: Oncology

## 2009-03-14 LAB — CBC WITH DIFFERENTIAL/PLATELET
BASO%: 0.9 % (ref 0.0–2.0)
EOS%: 2.8 % (ref 0.0–7.0)
HCT: 38.2 % (ref 34.8–46.6)
HGB: 13.1 g/dL (ref 11.6–15.9)
MCH: 32.6 pg (ref 25.1–34.0)
MCHC: 34.3 g/dL (ref 31.5–36.0)
MONO#: 0.6 10*3/uL (ref 0.1–0.9)
NEUT%: 54.8 % (ref 38.4–76.8)
RDW: 15.2 % — ABNORMAL HIGH (ref 11.2–14.5)
WBC: 5.2 10*3/uL (ref 3.9–10.3)
lymph#: 1.6 10*3/uL (ref 0.9–3.3)

## 2009-03-20 ENCOUNTER — Ambulatory Visit: Payer: Self-pay | Admitting: Internal Medicine

## 2009-03-20 DIAGNOSIS — J40 Bronchitis, not specified as acute or chronic: Secondary | ICD-10-CM

## 2009-03-28 ENCOUNTER — Encounter: Payer: Self-pay | Admitting: Internal Medicine

## 2009-03-28 LAB — CBC WITH DIFFERENTIAL/PLATELET
BASO%: 0.5 % (ref 0.0–2.0)
EOS%: 3.3 % (ref 0.0–7.0)
HCT: 37.6 % (ref 34.8–46.6)
LYMPH%: 29.1 % (ref 14.0–49.7)
MCH: 31.8 pg (ref 25.1–34.0)
MCHC: 34.2 g/dL (ref 31.5–36.0)
MCV: 93 fL (ref 79.5–101.0)
MONO%: 7.3 % (ref 0.0–14.0)
NEUT%: 59.8 % (ref 38.4–76.8)
lymph#: 2 10*3/uL (ref 0.9–3.3)

## 2009-03-29 LAB — COMPREHENSIVE METABOLIC PANEL
ALT: 13 U/L (ref 0–35)
AST: 22 U/L (ref 0–37)
Alkaline Phosphatase: 89 U/L (ref 39–117)
BUN: 30 mg/dL — ABNORMAL HIGH (ref 6–23)
Chloride: 97 mEq/L (ref 96–112)
Creatinine, Ser: 1.48 mg/dL — ABNORMAL HIGH (ref 0.40–1.20)
Total Bilirubin: 0.7 mg/dL (ref 0.3–1.2)

## 2009-03-29 LAB — IRON AND TIBC
%SAT: 45 % (ref 20–55)
Iron: 132 ug/dL (ref 42–145)
TIBC: 296 ug/dL (ref 250–470)
UIBC: 164 ug/dL

## 2009-03-29 LAB — TRANSFERRIN RECEPTOR, SOLUABLE: Transferrin Receptor, Soluble: 18.7 nmol/L

## 2009-04-04 ENCOUNTER — Ambulatory Visit: Payer: Self-pay | Admitting: Internal Medicine

## 2009-04-04 LAB — CONVERTED CEMR LAB
BUN: 26 mg/dL — ABNORMAL HIGH (ref 6–23)
CO2: 30 meq/L (ref 19–32)
Calcium: 9.2 mg/dL (ref 8.4–10.5)
Creatinine, Ser: 1.4 mg/dL — ABNORMAL HIGH (ref 0.4–1.2)
Eosinophils Absolute: 0.2 10*3/uL (ref 0.0–0.7)
Glucose, Bld: 96 mg/dL (ref 70–99)
HCT: 37.8 % (ref 36.0–46.0)
Lymphs Abs: 1.8 10*3/uL (ref 0.7–4.0)
MCHC: 33.2 g/dL (ref 30.0–36.0)
MCV: 95.8 fL (ref 78.0–100.0)
Monocytes Absolute: 0.5 10*3/uL (ref 0.1–1.0)
Neutrophils Relative %: 55.9 % (ref 43.0–77.0)
Platelets: 486 10*3/uL — ABNORMAL HIGH (ref 150.0–400.0)

## 2009-04-10 ENCOUNTER — Ambulatory Visit: Payer: Self-pay | Admitting: Internal Medicine

## 2009-04-11 ENCOUNTER — Telehealth: Payer: Self-pay | Admitting: Cardiovascular Disease

## 2009-04-21 ENCOUNTER — Ambulatory Visit: Payer: Self-pay | Admitting: Oncology

## 2009-04-24 ENCOUNTER — Ambulatory Visit: Payer: Self-pay | Admitting: Cardiovascular Disease

## 2009-04-25 LAB — CBC WITH DIFFERENTIAL/PLATELET
Eosinophils Absolute: 0.2 10*3/uL (ref 0.0–0.5)
HCT: 32.8 % — ABNORMAL LOW (ref 34.8–46.6)
HGB: 11.1 g/dL — ABNORMAL LOW (ref 11.6–15.9)
LYMPH%: 29.5 % (ref 14.0–49.7)
MONO#: 0.5 10*3/uL (ref 0.1–0.9)
NEUT#: 3.6 10*3/uL (ref 1.5–6.5)
NEUT%: 58 % (ref 38.4–76.8)
Platelets: 397 10*3/uL (ref 145–400)
WBC: 6.2 10*3/uL (ref 3.9–10.3)

## 2009-05-08 ENCOUNTER — Encounter: Payer: Self-pay | Admitting: Internal Medicine

## 2009-05-16 LAB — CBC WITH DIFFERENTIAL/PLATELET
BASO%: 0.6 % (ref 0.0–2.0)
Basophils Absolute: 0 10*3/uL (ref 0.0–0.1)
HCT: 38.9 % (ref 34.8–46.6)
LYMPH%: 32.6 % (ref 14.0–49.7)
MCH: 32.7 pg (ref 25.1–34.0)
MCHC: 34 g/dL (ref 31.5–36.0)
MONO#: 0.5 10*3/uL (ref 0.1–0.9)
NEUT%: 54.9 % (ref 38.4–76.8)
Platelets: 436 10*3/uL — ABNORMAL HIGH (ref 145–400)
WBC: 5.9 10*3/uL (ref 3.9–10.3)

## 2009-06-05 ENCOUNTER — Ambulatory Visit: Payer: Self-pay | Admitting: Oncology

## 2009-06-20 LAB — CBC WITH DIFFERENTIAL/PLATELET
Eosinophils Absolute: 0.2 10*3/uL (ref 0.0–0.5)
MONO#: 0.6 10*3/uL (ref 0.1–0.9)
NEUT#: 3.3 10*3/uL (ref 1.5–6.5)
Platelets: 377 10*3/uL (ref 145–400)
RBC: 3.73 10*6/uL (ref 3.70–5.45)
RDW: 13.4 % (ref 11.2–14.5)
WBC: 6 10*3/uL (ref 3.9–10.3)

## 2009-07-07 ENCOUNTER — Ambulatory Visit: Payer: Self-pay | Admitting: Oncology

## 2009-07-11 LAB — CBC WITH DIFFERENTIAL/PLATELET
Basophils Absolute: 0 10*3/uL (ref 0.0–0.1)
Eosinophils Absolute: 0.2 10*3/uL (ref 0.0–0.5)
HCT: 38 % (ref 34.8–46.6)
HGB: 13.1 g/dL (ref 11.6–15.9)
MCH: 32.2 pg (ref 25.1–34.0)
MONO#: 0.4 10*3/uL (ref 0.1–0.9)
NEUT#: 2.6 10*3/uL (ref 1.5–6.5)
NEUT%: 53.9 % (ref 38.4–76.8)
WBC: 4.9 10*3/uL (ref 3.9–10.3)
lymph#: 1.6 10*3/uL (ref 0.9–3.3)

## 2009-08-01 LAB — CBC WITH DIFFERENTIAL/PLATELET
BASO%: 0.4 % (ref 0.0–2.0)
HCT: 35.1 % (ref 34.8–46.6)
MCHC: 34.7 g/dL (ref 31.5–36.0)
MONO#: 0.5 10*3/uL (ref 0.1–0.9)
NEUT%: 54.4 % (ref 38.4–76.8)
RDW: 13.6 % (ref 11.2–14.5)
WBC: 5.7 10*3/uL (ref 3.9–10.3)
lymph#: 1.8 10*3/uL (ref 0.9–3.3)

## 2009-08-03 ENCOUNTER — Ambulatory Visit: Payer: Self-pay | Admitting: Internal Medicine

## 2009-08-03 LAB — CONVERTED CEMR LAB
BUN: 24 mg/dL — ABNORMAL HIGH (ref 6–23)
Calcium: 9.6 mg/dL (ref 8.4–10.5)
GFR calc non Af Amer: 43.05 mL/min (ref >60)
Potassium: 4.4 meq/L (ref 3.5–5.1)
Sodium: 137 meq/L (ref 135–145)

## 2009-08-07 ENCOUNTER — Ambulatory Visit: Payer: Self-pay | Admitting: Internal Medicine

## 2009-08-17 ENCOUNTER — Telehealth: Payer: Self-pay | Admitting: Internal Medicine

## 2009-08-18 ENCOUNTER — Ambulatory Visit: Payer: Self-pay | Admitting: Oncology

## 2009-08-22 ENCOUNTER — Encounter: Payer: Self-pay | Admitting: Internal Medicine

## 2009-08-22 LAB — CBC WITH DIFFERENTIAL/PLATELET
Basophils Absolute: 0 10*3/uL (ref 0.0–0.1)
Eosinophils Absolute: 0.1 10*3/uL (ref 0.0–0.5)
HGB: 13.6 g/dL (ref 11.6–15.9)
MCV: 94.3 fL (ref 79.5–101.0)
MONO#: 0.5 10*3/uL (ref 0.1–0.9)
MONO%: 6.5 % (ref 0.0–14.0)
NEUT#: 5.6 10*3/uL (ref 1.5–6.5)
RBC: 4.31 10*6/uL (ref 3.70–5.45)
RDW: 14.9 % — ABNORMAL HIGH (ref 11.2–14.5)
WBC: 7.8 10*3/uL (ref 3.9–10.3)

## 2009-08-23 LAB — TRANSFERRIN RECEPTOR, SOLUABLE: Transferrin Receptor, Soluble: 31.7 nmol/L

## 2009-08-23 LAB — COMPREHENSIVE METABOLIC PANEL
ALT: 15 U/L (ref 0–35)
AST: 23 U/L (ref 0–37)
Alkaline Phosphatase: 96 U/L (ref 39–117)
Creatinine, Ser: 1.46 mg/dL — ABNORMAL HIGH (ref 0.40–1.20)
Total Bilirubin: 0.8 mg/dL (ref 0.3–1.2)

## 2009-08-23 LAB — IRON AND TIBC
TIBC: 341 ug/dL (ref 250–470)
UIBC: 214 ug/dL

## 2009-08-23 LAB — FERRITIN: Ferritin: 110 ng/mL (ref 10–291)

## 2009-08-23 LAB — LACTATE DEHYDROGENASE: LDH: 194 U/L (ref 94–250)

## 2009-09-12 LAB — CBC WITH DIFFERENTIAL/PLATELET
BASO%: 0.5 % (ref 0.0–2.0)
LYMPH%: 29.5 % (ref 14.0–49.7)
MCHC: 34.6 g/dL (ref 31.5–36.0)
MONO#: 0.6 10*3/uL (ref 0.1–0.9)
Platelets: 420 10*3/uL — ABNORMAL HIGH (ref 145–400)
RBC: 3.93 10*6/uL (ref 3.70–5.45)
WBC: 7.4 10*3/uL (ref 3.9–10.3)
lymph#: 2.2 10*3/uL (ref 0.9–3.3)

## 2009-09-28 ENCOUNTER — Telehealth: Payer: Self-pay | Admitting: Cardiovascular Disease

## 2009-09-29 ENCOUNTER — Ambulatory Visit: Payer: Self-pay | Admitting: Oncology

## 2009-10-03 LAB — CBC WITH DIFFERENTIAL/PLATELET
Basophils Absolute: 0 10*3/uL (ref 0.0–0.1)
EOS%: 2.6 % (ref 0.0–7.0)
Eosinophils Absolute: 0.2 10*3/uL (ref 0.0–0.5)
HGB: 11.8 g/dL (ref 11.6–15.9)
MCH: 30.5 pg (ref 25.1–34.0)
NEUT#: 3.2 10*3/uL (ref 1.5–6.5)
RBC: 3.86 10*6/uL (ref 3.70–5.45)
RDW: 13.8 % (ref 11.2–14.5)
lymph#: 1.8 10*3/uL (ref 0.9–3.3)

## 2009-10-30 ENCOUNTER — Encounter: Payer: Self-pay | Admitting: Cardiovascular Disease

## 2009-10-31 ENCOUNTER — Ambulatory Visit: Payer: Self-pay

## 2009-10-31 ENCOUNTER — Ambulatory Visit: Payer: Self-pay | Admitting: Cardiovascular Disease

## 2009-11-02 ENCOUNTER — Ambulatory Visit: Payer: Self-pay | Admitting: Internal Medicine

## 2009-11-10 ENCOUNTER — Ambulatory Visit: Payer: Self-pay | Admitting: Oncology

## 2009-11-14 LAB — CBC WITH DIFFERENTIAL/PLATELET
BASO%: 0.6 % (ref 0.0–2.0)
Basophils Absolute: 0 10*3/uL (ref 0.0–0.1)
HCT: 34.9 % (ref 34.8–46.6)
LYMPH%: 32.3 % (ref 14.0–49.7)
MCH: 32.4 pg (ref 25.1–34.0)
MCHC: 34.5 g/dL (ref 31.5–36.0)
MONO#: 0.6 10*3/uL (ref 0.1–0.9)
NEUT%: 53.8 % (ref 38.4–76.8)
Platelets: 482 10*3/uL — ABNORMAL HIGH (ref 145–400)
WBC: 6.1 10*3/uL (ref 3.9–10.3)

## 2009-11-15 ENCOUNTER — Encounter: Admission: RE | Admit: 2009-11-15 | Discharge: 2009-11-15 | Payer: Self-pay | Admitting: Internal Medicine

## 2009-11-15 LAB — HM MAMMOGRAPHY: HM Mammogram: NEGATIVE

## 2009-11-25 IMAGING — CR DG ABDOMEN 1V
2 series · 2 of 2 positions shown · non-contrast
Comparison: None

CLINICAL DATA: Query foreign body right colon.

ABDOMEN - 1 VIEW

[t abdomen supine (1 of 2)]
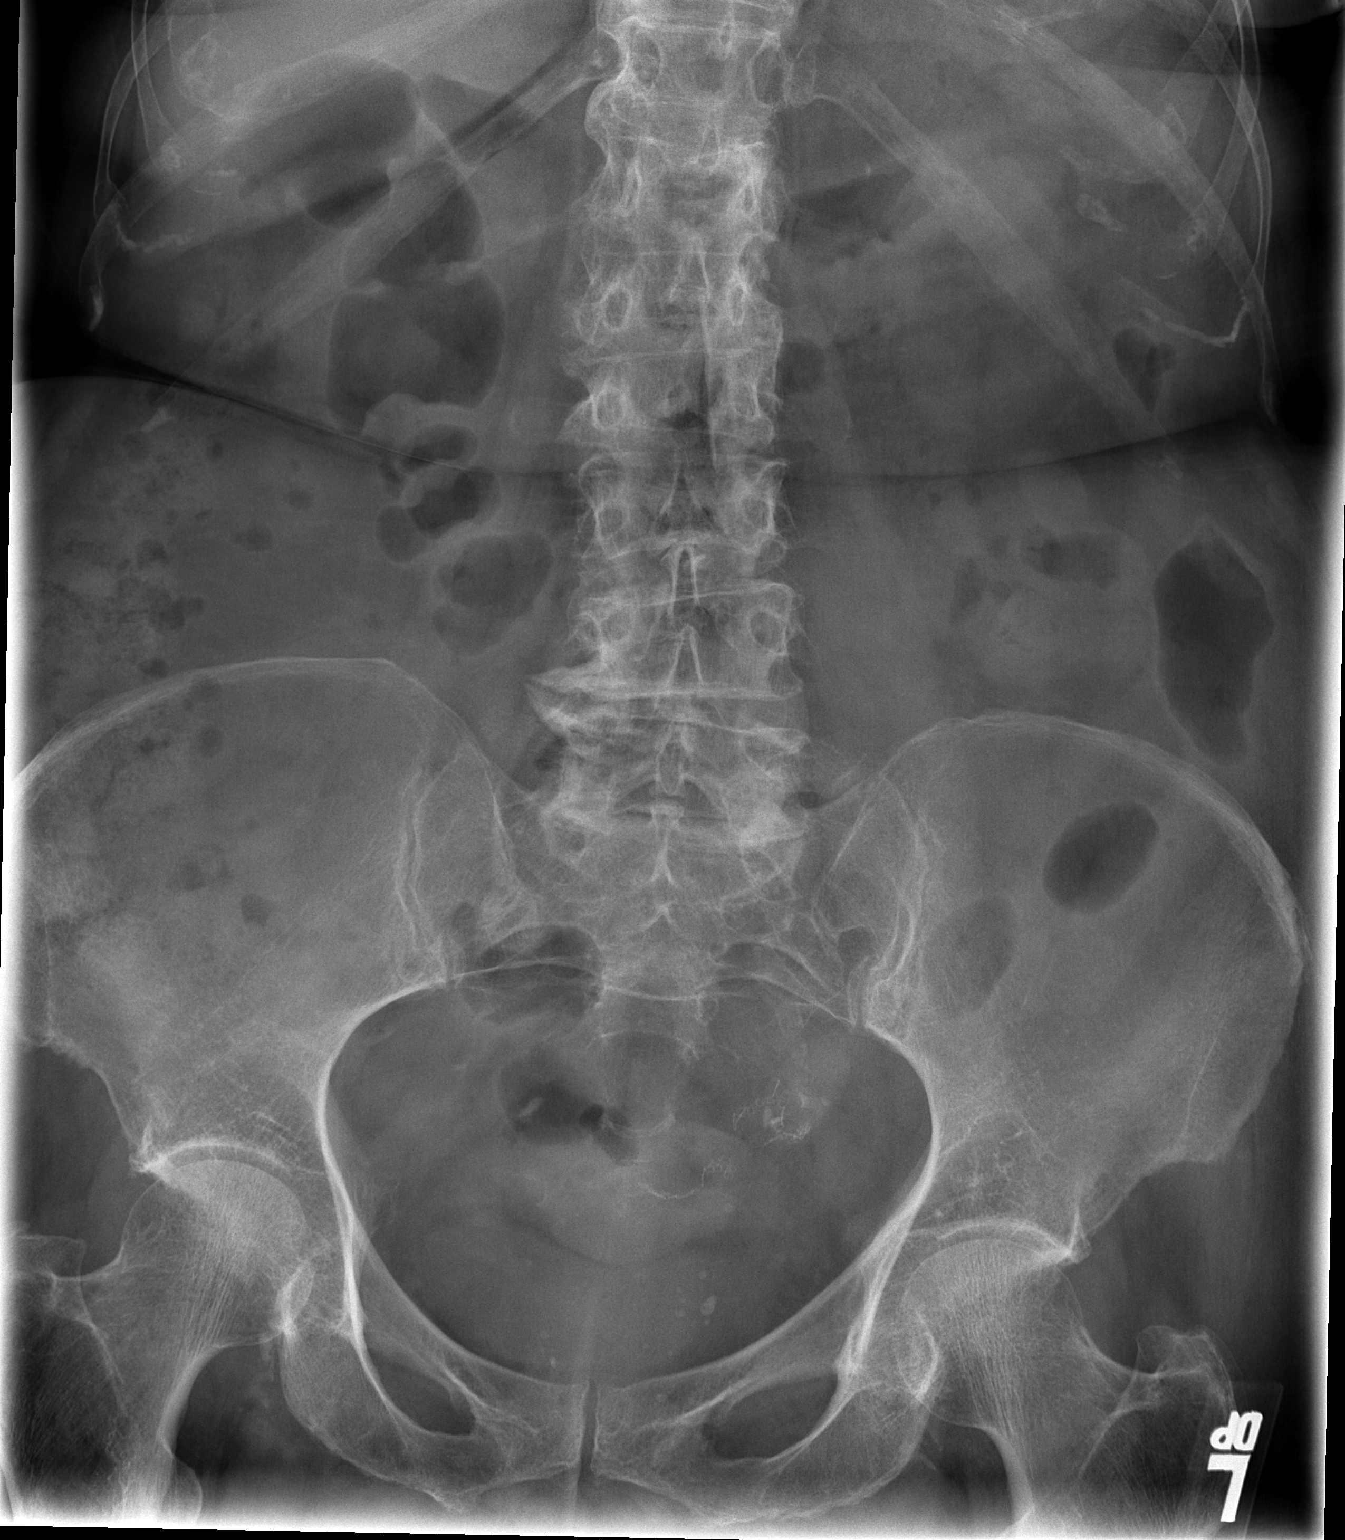

[t abdomen supine (2 of 2)]
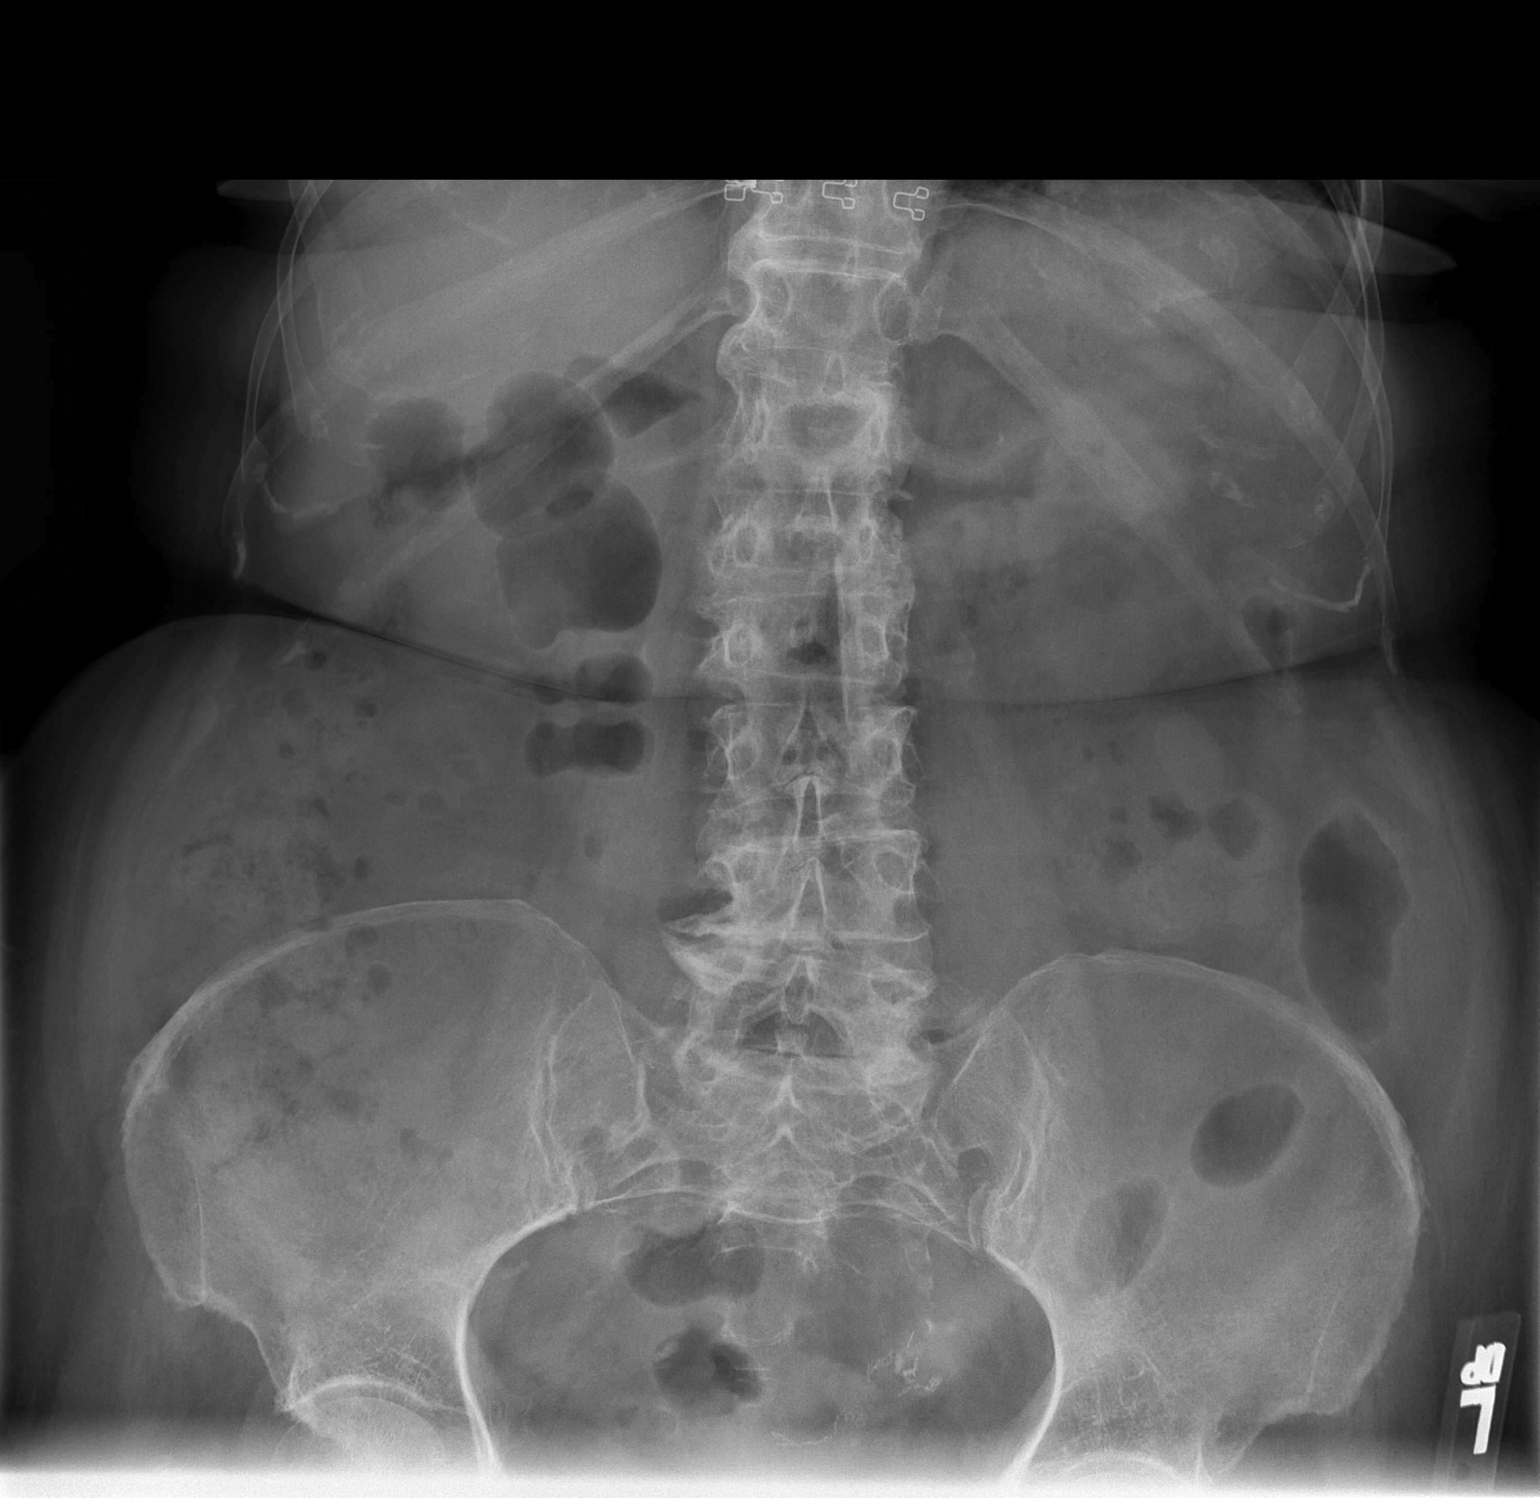

[2 of 2 positions shown; findings below may reference images not displayed]

FINDINGS: No radiopaque foreign body is appreciated.  Bowel gas
pattern is unremarkable.
IMPRESSION: No evidence of a radiopaque foreign body.

## 2009-12-08 ENCOUNTER — Ambulatory Visit: Payer: Self-pay | Admitting: Oncology

## 2009-12-12 LAB — CBC WITH DIFFERENTIAL/PLATELET
Basophils Absolute: 0 10*3/uL (ref 0.0–0.1)
Eosinophils Absolute: 0.2 10*3/uL (ref 0.0–0.5)
HCT: 38.2 % (ref 34.8–46.6)
HGB: 12.8 g/dL (ref 11.6–15.9)
MONO#: 0.5 10*3/uL (ref 0.1–0.9)
NEUT#: 3.4 10*3/uL (ref 1.5–6.5)
NEUT%: 57.8 % (ref 38.4–76.8)
RDW: 15.1 % — ABNORMAL HIGH (ref 11.2–14.5)
lymph#: 1.7 10*3/uL (ref 0.9–3.3)

## 2010-01-08 ENCOUNTER — Ambulatory Visit: Payer: Self-pay | Admitting: Oncology

## 2010-01-09 ENCOUNTER — Encounter: Payer: Self-pay | Admitting: Internal Medicine

## 2010-01-09 LAB — CBC WITH DIFFERENTIAL/PLATELET
BASO%: 0.8 % (ref 0.0–2.0)
EOS%: 3.8 % (ref 0.0–7.0)
HCT: 32.9 % — ABNORMAL LOW (ref 34.8–46.6)
HGB: 11.3 g/dL — ABNORMAL LOW (ref 11.6–15.9)
MCH: 32 pg (ref 25.1–34.0)
MCHC: 34.4 g/dL (ref 31.5–36.0)
MONO#: 0.6 10*3/uL (ref 0.1–0.9)
RDW: 13.5 % (ref 11.2–14.5)
WBC: 5.4 10*3/uL (ref 3.9–10.3)
lymph#: 1.5 10*3/uL (ref 0.9–3.3)

## 2010-02-01 ENCOUNTER — Ambulatory Visit
Admission: RE | Admit: 2010-02-01 | Discharge: 2010-02-01 | Payer: Self-pay | Source: Home / Self Care | Attending: Internal Medicine | Admitting: Internal Medicine

## 2010-02-01 DIAGNOSIS — R21 Rash and other nonspecific skin eruption: Secondary | ICD-10-CM | POA: Insufficient documentation

## 2010-02-06 ENCOUNTER — Encounter: Payer: Self-pay | Admitting: Internal Medicine

## 2010-02-06 LAB — CBC WITH DIFFERENTIAL/PLATELET
BASO%: 0.7 % (ref 0.0–2.0)
Basophils Absolute: 0 10*3/uL (ref 0.0–0.1)
EOS%: 3.5 % (ref 0.0–7.0)
Eosinophils Absolute: 0.2 10*3/uL (ref 0.0–0.5)
HCT: 39.6 % (ref 34.8–46.6)
HGB: 13.2 g/dL (ref 11.6–15.9)
LYMPH%: 27.6 % (ref 14.0–49.7)
MCH: 31.3 pg (ref 25.1–34.0)
MCHC: 33.3 g/dL (ref 31.5–36.0)
MCV: 94.1 fL (ref 79.5–101.0)
MONO#: 0.6 10*3/uL (ref 0.1–0.9)
MONO%: 9 % (ref 0.0–14.0)
NEUT#: 3.6 10*3/uL (ref 1.5–6.5)
NEUT%: 59.2 % (ref 38.4–76.8)
Platelets: 407 10*3/uL — ABNORMAL HIGH (ref 145–400)
RBC: 4.21 10*6/uL (ref 3.70–5.45)
RDW: 14.8 % — ABNORMAL HIGH (ref 11.2–14.5)
WBC: 6.1 10*3/uL (ref 3.9–10.3)
lymph#: 1.7 10*3/uL (ref 0.9–3.3)

## 2010-02-07 LAB — COMPREHENSIVE METABOLIC PANEL
ALT: 17 U/L (ref 0–35)
AST: 25 U/L (ref 0–37)
Albumin: 4 g/dL (ref 3.5–5.2)
Alkaline Phosphatase: 92 U/L (ref 39–117)
BUN: 35 mg/dL — ABNORMAL HIGH (ref 6–23)
CO2: 26 mEq/L (ref 19–32)
Calcium: 9.9 mg/dL (ref 8.4–10.5)
Chloride: 99 mEq/L (ref 96–112)
Creatinine, Ser: 1.5 mg/dL — ABNORMAL HIGH (ref 0.40–1.20)
Glucose, Bld: 74 mg/dL (ref 70–99)
Potassium: 4.2 mEq/L (ref 3.5–5.3)
Sodium: 135 mEq/L (ref 135–145)
Total Bilirubin: 0.6 mg/dL (ref 0.3–1.2)
Total Protein: 7 g/dL (ref 6.0–8.3)

## 2010-02-07 LAB — IRON AND TIBC
%SAT: 47 % (ref 20–55)
Iron: 158 ug/dL — ABNORMAL HIGH (ref 42–145)
TIBC: 337 ug/dL (ref 250–470)
UIBC: 179 ug/dL

## 2010-02-07 LAB — FERRITIN: Ferritin: 165 ng/mL (ref 10–291)

## 2010-02-07 LAB — LACTATE DEHYDROGENASE: LDH: 183 U/L (ref 94–250)

## 2010-02-07 LAB — TRANSFERRIN RECEPTOR, SOLUABLE: Transferrin Receptor, Soluble: 20.1 nmol/L

## 2010-02-18 LAB — CONVERTED CEMR LAB
Glucose, Urine, Semiquant: NEGATIVE
Ketones, urine, test strip: NEGATIVE
Urobilinogen, UA: 0.2
pH: 5

## 2010-02-20 NOTE — Progress Notes (Signed)
Summary: Calling regarding Lipitor discount card   Phone Note Call from Patient Call back at Home Phone [phone]   Caller: Patient Summary of Call: Pt calling regarding her  medication  Lipitor need a refill want to know if office has anymore discount card for htis medication Initial call taken by: Delsa Sale,  September 28, 2009 3:20 PM  Follow-up for Phone Call        spoke with pt, samples of lipitor given as well as discount card placed at the front desk for pick up [name] Beets, RN  September 28, 2009 3:54 PM

## 2010-02-20 NOTE — Letter (Signed)
Summary: [place]   Imported By: Rise Patience 04/20/2009 15:11:53  _____________________________________________________________________  External Attachment:    Type:   Image     Comment:   External Document

## 2010-02-20 NOTE — Assessment & Plan Note (Signed)
Summary: 6 MO F/U /CY  Medications Added FLUOXETINE HCL 10 MG CAPS (FLUOXETINE HCL) 1 tab every other day when pt remebers      Allergies Added:   Primary Provider:  Cassandria Anger MD  CC:  check up.  History of Present Illness: [name] [name] returns today for F/U of HTN, palpitations and fatigue.  She was hospitalized recently for a diverticular bleed.  This seems stable and she has F/U with Dr. Earlean Shawl.  She has had palpitations.  I reviewed her recenet event monitor and it was benighn with no significant arrythmias.  She has been compliant with her BP meds.  She has had a previous RCEA by Dr. Amedeo Plenty. She has residual [id number] LICA stenosis by duplex 12/2008  She will have a F/U duplex in 06/2009   She has had no TIA like symptoms and is off asa now due to her diverticular bleed.  She denies SSCP, palpitations, dyspnea, PND orthopnea and edema   Current Problems (verified): 1)  Bronchitis Not Specified As Acute or Chronic  (ICD-490) 2)  Laryngitis, Acute  (ICD-464.00) 3)  Carotid Artery Disease  (ICD-433.10) 4)  Peripheral Vascular Disease  (ICD-443.9) 5)  Hypertension  (ICD-401.9) 6)  Hyperlipidemia  (ICD-272.4) 7)  Cad  (ICD-414.00) 8)  Renal Insufficiency  (ICD-588.9) 9)  Diverticulosis of Colon With Hemorrhage  (ICD-562.12) 10)  Dehydration (VOLUME DEPLETION)  (ICD-276.51) 11)  Diarrhea  (ICD-787.91) 12)  Unspecified Contusion of Eye  (ICD-921.9) 13)  Syncope  (ICD-780.2) 14)  Unspecified Disorder of Urethra&urinary Tract  (ICD-599.9) 15)  Vitamin D Deficiency  (ICD-268.9) 16)  Pyelonephritis  (ICD-590.80) 17)  Cough  (ICD-786.2) 18)  Asthma  (ICD-493.90) 19)  Fatigue  (ICD-780.79) 20)  Insomnia, Persistent  (ICD-307.42) 21)  Low Back Pain  (ICD-724.2) 22)  Gerd  (ICD-530.81) 23)  Diverticulosis, Colon  (ICD-562.10) 24)  Depression  (ICD-311) 25)  Anxiety  (ICD-300.00) 26)  Anemia-nos  (ICD-285.9) 27)  Pancreatitis  (ICD-577.0) 28)  Osteomyelitis  (ICD-730.20)  Current  Medications (verified): 1)  Aciphex 20 Mg  Tbec (Rabeprazole Sodium) .... Once Daily 2)  Lorazepam 1 Mg  Tabs (Lorazepam) .[name].. 1 Tab Qhs  As Needed Anxiety, Insomnia 3)  Aranesp (Albumin Free) 100 Mcg/0.57ml  Soln (Darbepoetin Alfa-Polysorbate) .... Q 3 Wks 4)  Vitamin D3 1000 Unit  Tabs (Cholecalciferol) .... 2 By Mouth Daily 5)  Vitamin B-12 1000 Mcg Tabs (Cyanocobalamin) .... Once Daily 6)  Lipitor 20 Mg Tabs (Atorvastatin Calcium) .... Take One Tablet By Mouth Daily. 7)  Proair Hfa 108 (90 Base) Mcg/act Aers (Albuterol Sulfate) .[name].. 1-2 Puffs Every 4 Hours As Needed For Breathing 8)  Amlodipine Besylate 2.5 Mg Tabs (Amlodipine Besylate) .... Take 1 Q Am 9)  Diovan 80 Mg Tabs (Valsartan) .... Take 1 Q Am 10)  Chlorthalidone 25 Mg Tabs (Chlorthalidone) .... Take 1 Q Am 11)  Fluoxetine Hcl 10 Mg Caps (Fluoxetine Hcl) .[name].. 1 Tab Every Other Day When Pt Remebers  Allergies (verified): 1)  ! Codeine Sulfate (Codeine Sulfate) 2)  ! Darvon-N 3)  ! Sulfacetamide Sodium-Sulfur (Sulfacetamide Sodium-Sulfur)  Past History:  Past Medical History: Last updated: 03/20/2009 CEA:  right Dr. Amedeo Plenty. IMPRESSION: duplex 12/2007 1. Patent right ICA with no restenosis, status post CAD. 2. A 40-59% in-stent stenosis noted on the left ICA. 3. Left ECA stenosis.   HYPERTENSION (ICD-401.9)Dr [name] HYPERLIPIDEMIA (B2193296.4) RENAL INSUFFICIENCY (ICD-588.9) DIVERTICULOSIS OF COLON WITH HEMORRHAGE (ICD-562.12) DEHYDRATION (VOLUME DEPLETION) (ICD-276.51) DIARRHEA (ICD-787.91) UNSPECIFIED CONTUSION OF EYE (ICD-921.9) SYNCOPE (ICD-780.2) 03/2008  vasavagal.   UNSPECIFIED DISORDER OF URETHRA&URINARY TRACT (ICD-599.9) VITAMIN D DEFICIENCY (ICD-268.9) PYELONEPHRITIS (ICD-590.80) COUGH (ICD-786.2) ASTHMA (ICD-493.90) FATIGUE (ICD-780.79) INSOMNIA, PERSISTENT (ICD-307.42) LOW BACK PAIN (ICD-724.2) Dr Alvan Dame GERD (ICD-530.81)  Dr Earlean Shawl DIVERTICULOSIS, COLON (ICD-562.10) - bleed 2010 DEPRESSION  (ICD-311) ANXIETY (ICD-300.00) ANEMIA-NOS (ICD-285.9)-NOS  on shots now- 2009, bone marrow bx 2009  Dr Ralene Ok on Aranesp PANCREATITIS (ICD-577.0) OSTEOMYELITIS (ICD-730.20)  Past Surgical History: Last updated: 10/21/2008 Bone marrow bx status post right carotid enterectomy.   Family History: Last updated: 06/23/2007 Family History Hypertension  Social History: Last updated: 06/23/2007 Retired Single Never Smoked Occupation: part time Alcohol use-yes  Review of Systems       Denies fever, malais, weight loss, blurry vision, decreased visual acuity, cough, sputum, SOB, hemoptysis, pleuritic pain, palpitaitons, heartburn, abdominal pain, melena, lower extremity edema, claudication, or rash.   Vital Signs:  Patient profile:   77 year old female Height:      60 inches Weight:      178 pounds BMI:     34.89 Pulse rate:   80 / minute Resp:     14 per minute BP sitting:   130 / 70  (left arm)  Vitals Entered By: Burnett Kanaris (April 24, 2009 10:36 AM)  Physical Exam  General:  Affect appropriate Healthy:  appears stated age 26: normal Neck supple with no adenopathy JVP normal left  bruits no thyromegaly Lungs clear with no wheezing and good diaphragmatic motion Heart:  S1/S2 no murmur,rub, gallop or click PMI normal Abdomen: benighn, BS positve, no tenderness, no AAA no bruit.  No HSM or HJR Distal pulses intact with no bruits No edema Neuro non-focal Skin warm and dry    Impression & Recommendations:  Problem # 1:  CAROTID ARTERY DISEASE (ICD-433.10) F/U duplex in 6 months.  Continue ASA  Problem # 2:  HYPERTENSION (ICD-401.9) Well controlled Her updated medication list for this problem includes:    Amlodipine Besylate 2.5 Mg Tabs (Amlodipine besylate) .[name]... Take 1 q am    Diovan 80 Mg Tabs (Valsartan) .[name]... Take 1 q am    Chlorthalidone 25 Mg Tabs (Chlorthalidone) .[name]... Take 1 q am  Problem # 3:  HYPERLIPIDEMIA (ICD-272.4) Labs per primary  continue statin Her updated medication list for this problem includes:    Lipitor 20 Mg Tabs (Atorvastatin calcium) .[name]... Take one tablet by mouth daily.  Patient Instructions: 1)  Your physician recommends that you schedule a follow-up appointment in: 6 MONTHS 2)  Your physician has requested that you have a carotid duplex. This test is an ultrasound of the carotid arteries in your neck. It looks at blood flow through these arteries that supply the brain with blood. Allow one hour for this exam. There are no restrictions or special instructions.IN 6 MONTHS

## 2010-02-20 NOTE — Assessment & Plan Note (Signed)
Summary: 4 mos f/u / # cd   Vital Signs:  Patient profile:   77 year old female Height:      60 inches (152.40 cm) Weight:      176 pounds (80 kg) BMI:     34.50 O2 Sat:      95 % on Room air Temp:     97.3 degrees F (36.28 degrees C) oral Pulse rate:   82 / minute Pulse rhythm:   regular Resp:     16 per minute BP sitting:   130 / 70  (left arm) Cuff size:   regular  Vitals Entered By: Jonathon Resides, CMA(AAMA) (August 07, 2009 11:18 AM)  O2 Flow:  Room air CC: 4 mo f/u. C/O bilateral hip/leg pain X 2 mo Is Patient Diabetic? No Comments pt is not taking Fluoxetine 10mg .Please remove from list.   Primary Care Provider:  Cassandria Anger MD  CC:  4 mo f/u. C/O bilateral hip/leg pain X 2 mo.  History of Present Illness: The patient presents for a follow up of hypertension, anemia, hyperlipidemia C/o fatigue  Current Medications (verified): 1)  Aciphex 20 Mg  Tbec (Rabeprazole Sodium) .... Once Daily 2)  Lorazepam 1 Mg  Tabs (Lorazepam) .... 1/2  Tab Qhs  As Needed Anxiety, Insomnia 3)  Aranesp (Albumin Free) 100 Mcg/0.78ml  Soln (Darbepoetin Alfa-Polysorbate) .... Q 3 Wks 4)  Vitamin D3 1000 Unit  Tabs (Cholecalciferol) .... 2 By Mouth Daily 5)  Vitamin B-12 1000 Mcg Tabs (Cyanocobalamin) .... Once Daily 6)  Lipitor 20 Mg Tabs (Atorvastatin Calcium) .... Take One Tablet By Mouth Daily. 7)  Proair Hfa 108 (90 Base) Mcg/act Aers (Albuterol Sulfate) .[name].. 1-2 Puffs Every 4 Hours As Needed For Breathing 8)  Amlodipine Besylate 2.5 Mg Tabs (Amlodipine Besylate) .... Take 1 Q Am 9)  Diovan 80 Mg Tabs (Valsartan) .... Take 1 Q Am 10)  Chlorthalidone 25 Mg Tabs (Chlorthalidone) .... Take 1 Q Am 11)  Fluoxetine Hcl 10 Mg Caps (Fluoxetine Hcl) .[name].. 1 Tab Every Other Day When Pt Remebers  Allergies (verified): 1)  ! Codeine Sulfate (Codeine Sulfate) 2)  ! Darvon-N 3)  ! Sulfacetamide Sodium-Sulfur (Sulfacetamide Sodium-Sulfur)  Past History:  Past Surgical History: Last  updated: 10/21/2008 Bone marrow bx status post right carotid enterectomy.   Family History: Last updated: 06/23/2007 Family History Hypertension  Social History: Last updated: 06/23/2007 Retired Single Never Smoked Occupation: part time Alcohol use-yes  Past Medical History: CEA:  right Dr. Amedeo Plenty. IMPRESSION: duplex 12/2007 1. Patent right ICA with no restenosis, status post CAD. 2. A 40-59% in-stent stenosis noted on the left ICA. 3. Left ECA stenosis.   HYPERTENSION (ICD-401.9)Dr [name] HYPERLIPIDEMIA (B2193296.4) RENAL INSUFFICIENCY (ICD-588.9) DIVERTICULOSIS OF COLON WITH HEMORRHAGE (ICD-562.12) DEHYDRATION (VOLUME DEPLETION) (ICD-276.51) DIARRHEA (ICD-787.91) UNSPECIFIED CONTUSION OF EYE (ICD-921.9) SYNCOPE (ICD-780.2) 03/2008 vasavagal.   UNSPECIFIED DISORDER OF URETHRA&URINARY TRACT (ICD-599.9) VITAMIN D DEFICIENCY (ICD-268.9) PYELONEPHRITIS (ICD-590.80) COUGH (ICD-786.2) ASTHMA (ICD-493.90) FATIGUE (ICD-780.79) INSOMNIA, PERSISTENT (ICD-307.42) LOW BACK PAIN (ICD-724.2) Dr Alvan Dame GERD (ICD-530.81)  Dr Earlean Shawl DIVERTICULOSIS, COLON (ICD-562.10) - bleed 2010 DEPRESSION (ICD-311) ANXIETY (ICD-300.00) ANEMIA-NOS (ICD-285.9)-NOS poss related to CRI, on shots now- 2009, bone marrow bx 2009  Dr Ralene Ok on Aranesp PANCREATITIS (ICD-577.0) OSTEOMYELITIS (ICD-730.20)  Review of Systems       The patient complains of depression.  The patient denies fever, abdominal pain, and melena.    Physical Exam  General:  Well-developed,well-nourished,in no acute distress; alert,appropriate and cooperative throughout examination Ears:  normal pinnae bilaterally,  without erythema, swelling, or tenderness to palpation. TMs clear, without effusion, or cerumen impaction. Hearing grossly normal bilaterally  Nose:  External nasal examination shows no deformity or inflammation. Nasal mucosa are pink and moist without lesions or exudates. Mouth:  teeth and gums in good repair; mucous  membranes moist, without lesions or ulcers. oropharynx clear without exudate, mod erythema.  Lungs:  normal respiratory effort, no intercostal retractions or use of accessory muscles; normal breath sounds bilaterally - no crackles and no wheezes.    Heart:  normal rate, regular rhythm, no murmur, and no rub. BLE without edema.  Abdomen:  Bowel sounds positive,abdomen soft and non-tender without masses, organomegaly or hernias noted. Msk:   scalp is not  tender Neurologic:  alert & oriented X3, gait normal, and DTRs symmetrical and normal.   Skin:  WNL Psych:  Oriented X3.  depressed affect and tearful.     Impression & Recommendations:  Problem # 1:  HYPERTENSION (ICD-401.9) Assessment Unchanged  Her updated medication list for this problem includes:    Amlodipine Besylate 2.5 Mg Tabs (Amlodipine besylate) .[name]... Take 1 q am    Diovan 80 Mg Tabs (Valsartan) .[name]... Take 1 q am    Chlorthalidone 25 Mg Tabs (Chlorthalidone) .[name]... Take 1 q am  BP today: 130/70 Prior BP: 130/70 (04/24/2009)  Labs Reviewed: K+: 4.4 (08/03/2009) Creat: : 1.3 (08/03/2009)     Problem # 2:  HYPERLIPIDEMIA (ICD-272.4) Assessment: Unchanged  Her updated medication list for this problem includes:    Lipitor 20 Mg Tabs (Atorvastatin calcium) .[name]... Take one tablet by mouth daily.  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 Q 3 wks    Vitamin B-12 1000 Mcg Tabs (Cyanocobalamin) ..... Once daily  Problem # 6:  RENAL INSUFFICIENCY (ICD-588.9) Assessment: Unchanged The labs were reviewed with the patient.   Problem # 7:  DEPRESSION (ICD-311) Assessment: Unchanged  The following medications were removed  from the medication list:    Fluoxetine Hcl 10 Mg Caps (Fluoxetine hcl) .[name]... 1 tab every other day when pt remebers Her updated medication list for this problem includes:    Lorazepam 1 Mg Tabs (Lorazepam) .[name]... 1/2  tab qhs  as needed anxiety, insomnia    Citalopram Hydrobromide 10 Mg Tabs (Citalopram hydrobromide) .[name]... 1 by mouth once daily for depression - restart  Complete Medication List: 1)  Aciphex 20 Mg Tbec (Rabeprazole sodium) .... Once daily 2)  Lorazepam 1 Mg Tabs (Lorazepam) .... 1/2  tab qhs  as needed anxiety, insomnia 3)  Aranesp (albumin Free) 100 Mcg/0.18ml Soln (Darbepoetin alfa-polysorbate) .... Q 3 wks 4)  Vitamin D3 1000 Unit Tabs (Cholecalciferol) .... 2 by mouth daily 5)  Vitamin B-12 1000 Mcg Tabs (Cyanocobalamin) .... Once daily 6)  Lipitor 20 Mg Tabs (Atorvastatin calcium) .... Take one tablet by mouth daily. 7)  Proair Hfa 108 (90 Base) Mcg/act Aers (Albuterol sulfate) .[name].. 1-2 puffs every 4 hours as needed for breathing 8)  Amlodipine Besylate 2.5 Mg Tabs (Amlodipine besylate) .... Take 1 q am 9)  Diovan 80 Mg Tabs (Valsartan) .... Take 1 q am 10)  Chlorthalidone 25 Mg Tabs (Chlorthalidone) .... Take 1 q am 11)  Citalopram Hydrobromide 10 Mg Tabs (Citalopram hydrobromide) .[name].. 1 by  mouth once daily for depression  Patient Instructions: 1)  Please schedule a follow-up appointment in 3 months. 2)  Use stretching and balance exercises that I have provided (15 min. or longer every day)  Prescriptions: CITALOPRAM HYDROBROMIDE 10 MG TABS (CITALOPRAM HYDROBROMIDE) 1 by mouth once daily for depression  #30 x 6   Entered and Authorized by:   Cassandria Anger MD   Signed by:   Cassandria Anger MD on 08/07/2009   Method used:   Print then Give to Patient   RxID:   [id number]

## 2010-02-20 NOTE — Letter (Signed)
Summary: Medication Log/[facility]   Imported By: Phillis Knack 06/09/2009 08:38:41  _____________________________________________________________________  External Attachment:    Type:   Image     Comment:   External Document

## 2010-02-20 NOTE — Assessment & Plan Note (Signed)
Summary: 3 MTH FU STC  RS'D PER PT/NWS   Vital Signs:  Patient profile:   77 year old female Weight:      174 pounds Temp:     97.3 degrees F oral Pulse rate:   75 / minute BP sitting:   126 / 64  (left arm)  Vitals Entered By: Doralee Albino (April 10, 2009 11:40 AM) CC: f/u Is Patient Diabetic? No   Primary Care Provider:  Cassandria Anger MD  CC:  f/u.  History of Present Illness: The patient presents for a follow up of hypertension,  cough, hyperlipidemia. Cough is better.   Preventive Screening-Counseling & Management  Alcohol-Tobacco     Smoking Status: never  Current Medications (verified): 1)  Aciphex 20 Mg  Tbec (Rabeprazole Sodium) .... Once Daily 2)  Fluoxetine Hcl 10 Mg  Caps (Fluoxetine Hcl) .... Take 1 Capsule By Mouth Every Other Day 3)  Lorazepam 1 Mg  Tabs (Lorazepam) .[name].. 1 Tab Qhs  As Needed Anxiety, Insomnia 4)  Aranesp (Albumin Free) 100 Mcg/0.41ml  Soln (Darbepoetin Alfa-Polysorbate) .... Q 3 Wks 5)  Vitamin D3 1000 Unit  Tabs (Cholecalciferol) .... 2 By Mouth Daily 6)  Vitamin B-12 1000 Mcg Tabs (Cyanocobalamin) .... Once Daily 7)  Lipitor 20 Mg Tabs (Atorvastatin Calcium) .... Take One Tablet By Mouth Daily. 8)  Proair Hfa 108 (90 Base) Mcg/act Aers (Albuterol Sulfate) .[name].. 1-2 Puffs Every 4 Hours As Needed For Breathing 9)  Amlodipine Besylate 2.5 Mg Tabs (Amlodipine Besylate) .... Take 1 Q Am 10)  Diovan 80 Mg Tabs (Valsartan) .... Take 1 Q Am 11)  Chlorthalidone 25 Mg Tabs (Chlorthalidone) .... Take 1 Q Am  Allergies: 1)  ! Codeine Sulfate (Codeine Sulfate) 2)  ! Darvon-N 3)  ! Sulfacetamide Sodium-Sulfur (Sulfacetamide Sodium-Sulfur)  Past History:  Past Medical History: Last updated: 03/20/2009 CEA:  right Dr. Amedeo Plenty. IMPRESSION: duplex 12/2007 1. Patent right ICA with no restenosis, status post CAD. 2. A 40-59% in-stent stenosis noted on the left ICA. 3. Left ECA stenosis.   HYPERTENSION (ICD-401.9)Dr [name] HYPERLIPIDEMIA  (B2193296.4) RENAL INSUFFICIENCY (ICD-588.9) DIVERTICULOSIS OF COLON WITH HEMORRHAGE (ICD-562.12) DEHYDRATION (VOLUME DEPLETION) (ICD-276.51) DIARRHEA (ICD-787.91) UNSPECIFIED CONTUSION OF EYE (ICD-921.9) SYNCOPE (ICD-780.2) 03/2008 vasavagal.   UNSPECIFIED DISORDER OF URETHRA&URINARY TRACT (ICD-599.9) VITAMIN D DEFICIENCY (ICD-268.9) PYELONEPHRITIS (ICD-590.80) COUGH (ICD-786.2) ASTHMA (ICD-493.90) FATIGUE (ICD-780.79) INSOMNIA, PERSISTENT (ICD-307.42) LOW BACK PAIN (ICD-724.2) Dr Alvan Dame GERD (ICD-530.81)  Dr Earlean Shawl DIVERTICULOSIS, COLON (ICD-562.10) - bleed 2010 DEPRESSION (ICD-311) ANXIETY (ICD-300.00) ANEMIA-NOS (ICD-285.9)-NOS  on shots now- 2009, bone marrow bx 2009  Dr Ralene Ok on Aranesp PANCREATITIS (ICD-577.0) OSTEOMYELITIS (ICD-730.20)  Social History: Last updated: 06/23/2007 Retired Single Never Smoked Occupation: part time Alcohol use-yes  Review of Systems  The patient denies fever, weight loss, weight gain, chest pain, abdominal pain, and melena.    Physical Exam  General:  Well-developed,well-nourished,in no acute distress; alert,appropriate and cooperative throughout examination Nose:  External nasal examination shows no deformity or inflammation. Nasal mucosa are pink and moist without lesions or exudates. Mouth:  teeth and gums in good repair; mucous membranes moist, without lesions or ulcers. oropharynx clear without exudate, mod erythema.  Lungs:  normal respiratory effort, no intercostal retractions or use of accessory muscles; normal breath sounds bilaterally - no crackles and no wheezes.    Heart:  normal rate, regular rhythm, no murmur, and no rub. BLE without edema.  Abdomen:  Bowel sounds positive,abdomen soft and non-tender without masses, organomegaly or hernias noted. Msk:  L scalp tender Neurologic:  alert & oriented X3, gait normal, and DTRs symmetrical and normal.   Skin:  WNL Psych:  Oriented X3.     Impression &  Recommendations:  Problem # 1:  HYPERTENSION (ICD-401.9) Assessment Improved  Her updated medication list for this problem includes:    Amlodipine Besylate 2.5 Mg Tabs (Amlodipine besylate) .[name]... Take 1 q am    Diovan 80 Mg Tabs (Valsartan) .[name]... Take 1 q am    Chlorthalidone 25 Mg Tabs (Chlorthalidone) .[name]... Take 1 q am  BP today: 126/64 Prior BP: 144/70 (03/20/2009)  Labs Reviewed: K+: 3.8 (04/04/2009) Creat: : 1.4 (04/04/2009)     Problem # 2:  HYPERLIPIDEMIA (ICD-272.4) Assessment: Improved  Her updated medication list for this problem includes:    Lipitor 20 Mg Tabs (Atorvastatin calcium) .[name]... Take one tablet by mouth daily.  Problem # 3:  ASTHMA (ICD-493.90) Assessment: Improved  Her updated medication list for this problem includes:    Proair Hfa 108 (90 Base) Mcg/act Aers (Albuterol sulfate) .[name]... 1-2 puffs every 4 hours as needed for breathing  Problem # 4:  LARYNGITIS, ACUTE (ICD-464.00) Assessment: Improved  Complete Medication List: 1)  Aciphex 20 Mg Tbec (Rabeprazole sodium) .... Once daily 2)  Lorazepam 1 Mg Tabs (Lorazepam) .[name].. 1 tab qhs  as needed anxiety, insomnia 3)  Aranesp (albumin Free) 100 Mcg/0.74ml Soln (Darbepoetin alfa-polysorbate) .... Q 3 wks 4)  Vitamin D3 1000 Unit Tabs (Cholecalciferol) .... 2 by mouth daily 5)  Vitamin B-12 1000 Mcg Tabs (Cyanocobalamin) .... Once daily 6)  Lipitor 20 Mg Tabs (Atorvastatin calcium) .... Take one tablet by mouth daily. 7)  Proair Hfa 108 (90 Base) Mcg/act Aers (Albuterol sulfate) .[name].. 1-2 puffs every 4 hours as needed for breathing 8)  Amlodipine Besylate 2.5 Mg Tabs (Amlodipine besylate) .... Take 1 q am 9)  Diovan 80 Mg Tabs (Valsartan) .... Take 1 q am 10)  Chlorthalidone 25 Mg Tabs (Chlorthalidone) .... Take 1 q am 11)  Zoloft 25 Mg Tabs (Sertraline hcl) .[name].. 1 by mouth qd  Patient Instructions: 1)  Please schedule a follow-up appointment in 4 months. 2)  BMP prior to visit, ICD-9:  401.1 Prescriptions: ZOLOFT 25 MG TABS (SERTRALINE HCL) 1 by mouth qd  #30 x 6   Entered and Authorized by:   Cassandria Anger MD   Signed by:   Cassandria Anger MD on 04/10/2009   Method used:   Print then Give to Patient   RxID:   [id number]

## 2010-02-20 NOTE — Progress Notes (Signed)
Summary: Lorazepam Rf/ ?  Phone Note Refill Request Message from:  Fax from Pharmacy  Refills Requested: Medication #1:  LORAZEPAM 1 MG  TABS 1/2  tab qhs  as needed anxiety   Supply Requested: 30   Last Refilled: 06/27/2009 request is for 1 at bedtime as needed for anxiety.  Our chart says 1/2 at bedtime.  Please advise   Method Requested: Telephone to Pharmacy Initial call taken by: Jonathon Resides, [facility]),  August 17, 2009 11:52 AM  Follow-up for Phone Call        ok 3 ref Follow-up by: Cassandria Anger MD,  August 17, 2009 12:16 PM  Additional Follow-up for Phone Call Additional follow up Details #1::        Rx called to pharmacy Additional Follow-up by: Jonathon Resides, [facility]),  August 17, 2009 1:48 PM    Prescriptions: LORAZEPAM 1 MG  TABS (LORAZEPAM) 1/2  tab qhs  as needed anxiety, insomnia  #30 x 3   Entered by:   Jonathon Resides, [facility])   Authorized by:   Cassandria Anger MD   Signed by:   Jonathon Resides, CMA(AAMA) on 08/17/2009   Method used:   Telephoned to ...       [facility] [address] (retail)       [address]       [place], [place]  [address]       Ph: [phone] or [phone]       Fax: [phone]   RxID:   [id number]

## 2010-02-20 NOTE — Assessment & Plan Note (Signed)
Summary: 3 MTH FU  STC   Vital Signs:  Patient profile:   77 year old female Height:      60 inches Weight:      179 pounds BMI:     35.08 Temp:     97.3 degrees F oral Pulse rate:   84 / minute Pulse rhythm:   regular Resp:     16 per minute BP sitting:   130 / 60  (left arm) Cuff size:   regular  Vitals Entered By: Jonathon Resides, Gabrielle Dare) (November 02, 2009 10:38 AM) CC: 3 mo f/u Is Patient Diabetic? No Comments pt is not taking Citalopram   Primary Care Provider:  [name] Plotnikov MD  CC:  3 mo f/u.  History of Present Illness: The patient presents for a follow up of hypertension, PVD, hyperlipidemia C/o L foot cramps at times   Preventive Screening-Counseling & Management  Caffeine-Diet-Exercise     Does Patient Exercise: yes  Current Medications (verified): 1)  Aciphex 20 Mg  Tbec (Rabeprazole Sodium) .... As Needed 2)  Lorazepam 1 Mg  Tabs (Lorazepam) .... 1/2  Tab Qhs  As Needed Anxiety, Insomnia 3)  Aranesp (Albumin Free) 100 Mcg/0.22ml  Soln (Darbepoetin Alfa-Polysorbate) .... Q 3 Wks 4)  Vitamin D3 1000 Unit  Tabs (Cholecalciferol) .[name].. 1 By Mouth Daily 5)  Vitamin B-12 1000 Mcg Tabs (Cyanocobalamin) .... Once Daily 6)  Lipitor 20 Mg Tabs (Atorvastatin Calcium) .... Take One Tablet By Mouth Daily. 7)  Proair Hfa 108 (90 Base) Mcg/act Aers (Albuterol Sulfate) .[name].. 1-2 Puffs Every 4 Hours As Needed For Breathing 8)  Amlodipine Besylate 2.5 Mg Tabs (Amlodipine Besylate) .... Take 1/2 Q Am 9)  Diovan 80 Mg Tabs (Valsartan) .... Take 1 Q Am 10)  Chlorthalidone 25 Mg Tabs (Chlorthalidone) .... Take 1/2 Q Am 11)  Citalopram Hydrobromide 10 Mg Tabs (Citalopram Hydrobromide) .[name].. 1 By Mouth Once Daily For Depression  Allergies (verified): 1)  ! Codeine Sulfate (Codeine Sulfate) 2)  ! Darvon-N 3)  ! Sulfacetamide Sodium-Sulfur (Sulfacetamide Sodium-Sulfur)  Past History:  Past Medical History: Last updated: 08/07/2009 CEA:  right Dr. Amedeo Plenty. IMPRESSION:  duplex 12/2007 1. Patent right ICA with no restenosis, status post CAD. 2. A 40-59% in-stent stenosis noted on the left ICA. 3. Left ECA stenosis.   HYPERTENSION (ICD-401.9)Dr [name] HYPERLIPIDEMIA ([id number]) RENAL INSUFFICIENCY (ICD-588.9) DIVERTICULOSIS OF COLON WITH HEMORRHAGE (ICD-562.12) DEHYDRATION (VOLUME DEPLETION) (ICD-276.51) DIARRHEA (ICD-787.91) UNSPECIFIED CONTUSION OF EYE (ICD-921.9) SYNCOPE (ICD-780.2) 03/2008 vasavagal.   UNSPECIFIED DISORDER OF URETHRA&URINARY TRACT (ICD-599.9) VITAMIN D DEFICIENCY (ICD-268.9) PYELONEPHRITIS (ICD-590.80) COUGH (ICD-786.2) ASTHMA (ICD-493.90) FATIGUE (ICD-780.79) INSOMNIA, PERSISTENT (ICD-307.42) LOW BACK PAIN (ICD-724.2) Dr Alvan Dame GERD (ICD-530.81)  Dr Earlean Shawl DIVERTICULOSIS, COLON (ICD-562.10) - bleed 2010 DEPRESSION (ICD-311) ANXIETY (ICD-300.00) ANEMIA-NOS (ICD-285.9)-NOS poss related to CRI, on shots now- 2009, bone marrow bx 2009  Dr Ralene Ok on Aranesp PANCREATITIS (ICD-577.0) OSTEOMYELITIS (ICD-730.20)  Social History: Retired Single Never Smoked Occupation: part time Alcohol use-yes Regular exercise-yes for seniors Does Patient Exercise:  yes  Review of Systems  The patient denies fever, dyspnea on exertion, abdominal pain, and melena.    Physical Exam  General:  Well-developed,well-nourished,in no acute distress; alert,appropriate and cooperative throughout examination Nose:  External nasal examination shows no deformity or inflammation. Nasal mucosa are pink and moist without lesions or exudates. Mouth:  teeth and gums in good repair; mucous membranes moist, without lesions or ulcers. oropharynx clear without exudate, mod erythema.  Lungs:  normal respiratory effort, no intercostal retractions or use of accessory  muscles; normal breath sounds bilaterally - no crackles and no wheezes.    Heart:  normal rate, regular rhythm, no murmur, and no rub. BLE without edema.  Abdomen:  Bowel sounds positive,abdomen soft  and non-tender without masses, organomegaly or hernias noted. Msk:   scalp is not  tender Extremities:  No clubbing, cyanosis, edema, or deformity noted with normal full range of motion of all joints.   Neurologic:  alert & oriented X3, gait normal, and DTRs symmetrical and normal.   Skin:  WNL Psych:  Oriented X3.  depressed affect and tearful.     Impression & Recommendations:  Problem # 1:  HYPERTENSION (ICD-401.9) Assessment Improved BP nl at home Her updated medication list for this problem includes:    Amlodipine Besylate 2.5 Mg Tabs (Amlodipine besylate) .[name]... Take 1/2 q am    Diovan 80 Mg Tabs (Valsartan) .[name]... Take 1 q am    Chlorthalidone 25 Mg Tabs (Chlorthalidone) .[name]... Take 1/2 q am  BP today: 130/60 Prior BP: 135/74 (10/31/2009)  Labs Reviewed: K+: 4.4 (08/03/2009) Creat: : 1.3 (08/03/2009)     Problem # 2:  HYPERLIPIDEMIA (ICD-272.4) Assessment: Improved  Her updated medication list for this problem includes:    Lipitor 20 Mg Tabs (Atorvastatin calcium) .[name]... Take one tablet by mouth daily.  Problem # 3:  PERIPHERAL VASCULAR DISEASE (ICD-443.9) Assessment: Improved On the regimen of medicine(s) reflected in the chart    Problem # 4:  DIVERTICULOSIS OF COLON WITH HEMORRHAGE (ICD-562.12) resolved > 12 mo ago Assessment: Comment Only OK to restart ASA  Problem # 5:  DEPRESSION (ICD-311) Assessment: Improved  The following medications were removed from the medication list:    Citalopram Hydrobromide 10 Mg Tabs (Citalopram hydrobromide) .[name]... 1 by mouth once daily for depression Her updated medication list for this problem includes:    Lorazepam 1 Mg Tabs (Lorazepam) .[name]... 1/2  tab qhs  as needed anxiety, insomnia  Complete Medication List: 1)  Aciphex 20 Mg Tbec (Rabeprazole sodium) .... As needed 2)  Lorazepam 1 Mg Tabs (Lorazepam) .... 1/2  tab qhs  as needed anxiety, insomnia 3)  Aranesp (albumin Free) 100 Mcg/0.33ml Soln (Darbepoetin alfa-polysorbate)  .... Q 3 wks 4)  Vitamin D3 1000 Unit Tabs (Cholecalciferol) .[name].. 1 by mouth daily 5)  Vitamin B-12 1000 Mcg Tabs (Cyanocobalamin) .... Once daily 6)  Lipitor 20 Mg Tabs (Atorvastatin calcium) .... Take one tablet by mouth daily. 7)  Proair Hfa 108 (90 Base) Mcg/act Aers (Albuterol sulfate) .[name].. 1-2 puffs every 4 hours as needed for breathing 8)  Amlodipine Besylate 2.5 Mg Tabs (Amlodipine besylate) .... Take 1/2 q am 9)  Diovan 80 Mg Tabs (Valsartan) .... Take 1 q am 10)  Chlorthalidone 25 Mg Tabs (Chlorthalidone) .... Take 1/2 q am 11)  Aspirin 81 Mg Tbec (Aspirin) .[name].. 1 by mouth qd  Patient Instructions: 1)  Please schedule a follow-up appointment in 3 months well w/labs.

## 2010-02-20 NOTE — Letter (Signed)
Summary: [facility]   Imported By: Rise Patience 09/21/2009 17:28:18  _____________________________________________________________________  External Attachment:    Type:   Image     Comment:   External Document

## 2010-02-20 NOTE — Miscellaneous (Signed)
Summary: Orders Update  Clinical Lists Changes  Orders: Added new Test order of Carotid Duplex (Carotid Duplex) - Signed 

## 2010-02-20 NOTE — Assessment & Plan Note (Signed)
Summary: F6M/DM  Medications Added ACIPHEX 20 MG  TBEC (RABEPRAZOLE SODIUM) as needed      Allergies Added:   Primary Provider:  Cassandria Anger MD  CC:  sob...pt did not bring med list.  History of Present Illness: [name] [name] returns today for F/U of HTN, palpitations and fatigue.  She was hospitalized recently for a diverticular bleed.  This seems stable and she has F/U with Dr. Earlean Shawl.  She has had palpitations.  I reviewed her recenet event monitor and it was benighn with no significant arrythmias.  She has been compliant with her BP meds.  She has had a previous RCEA by Dr. Amedeo Plenty. She has residua40-59% bilateral  stenosis by duplex 10/11   She will have a F/U duplex in10/12    She has had no TIA like symptoms and is off asa now due to her diverticular bleed.  She denies SSCP, palpitations, dyspnea, PND orthopnea and edema    She is on a study at [facility] for BP and we need to know what she takes  Current Problems (verified): 1)  Bronchitis Not Specified As Acute or Chronic  (ICD-490) 2)  Laryngitis, Acute  (ICD-464.00) 3)  Carotid Artery Disease  (ICD-433.10) 4)  Peripheral Vascular Disease  (ICD-443.9) 5)  Hypertension  (ICD-401.9) 6)  Hyperlipidemia  (ICD-272.4) 7)  Cad  (ICD-414.00) 8)  Renal Insufficiency  (ICD-588.9) 9)  Diverticulosis of Colon With Hemorrhage  (ICD-562.12) 10)  Dehydration (VOLUME DEPLETION)  (ICD-276.51) 11)  Diarrhea  (ICD-787.91) 12)  Unspecified Contusion of Eye  (ICD-921.9) 13)  Syncope  (ICD-780.2) 14)  Unspecified Disorder of Urethra&urinary Tract  (ICD-599.9) 15)  Vitamin D Deficiency  (ICD-268.9) 16)  Pyelonephritis  (ICD-590.80) 17)  Cough  (ICD-786.2) 18)  Asthma  (ICD-493.90) 19)  Fatigue  (ICD-780.79) 20)  Insomnia, Persistent  (ICD-307.42) 21)  Low Back Pain  (ICD-724.2) 22)  Gerd  (ICD-530.81) 23)  Diverticulosis, Colon  (ICD-562.10) 24)  Depression  (ICD-311) 25)  Anxiety  (ICD-300.00) 26)  Anemia-nos  (ICD-285.9) 27)  Pancreatitis   (ICD-577.0) 28)  Osteomyelitis  (ICD-730.20)  Current Medications (verified): 1)  Aciphex 20 Mg  Tbec (Rabeprazole Sodium) .... As Needed 2)  Lorazepam 1 Mg  Tabs (Lorazepam) .... 1/2  Tab Qhs  As Needed Anxiety, Insomnia 3)  Aranesp (Albumin Free) 100 Mcg/0.3ml  Soln (Darbepoetin Alfa-Polysorbate) .... Q 3 Wks 4)  Vitamin D3 1000 Unit  Tabs (Cholecalciferol) .... 2 By Mouth Daily 5)  Vitamin B-12 1000 Mcg Tabs (Cyanocobalamin) .... Once Daily 6)  Lipitor 20 Mg Tabs (Atorvastatin Calcium) .... Take One Tablet By Mouth Daily. 7)  Proair Hfa 108 (90 Base) Mcg/act Aers (Albuterol Sulfate) .[name].. 1-2 Puffs Every 4 Hours As Needed For Breathing 8)  Amlodipine Besylate 2.5 Mg Tabs (Amlodipine Besylate) .... Take 1 Q Am 9)  Diovan 80 Mg Tabs (Valsartan) .... Take 1 Q Am 10)  Chlorthalidone 25 Mg Tabs (Chlorthalidone) .... Take 1 Q Am 11)  Citalopram Hydrobromide 10 Mg Tabs (Citalopram Hydrobromide) .[name].. 1 By Mouth Once Daily For Depression  Allergies (verified): 1)  ! Codeine Sulfate (Codeine Sulfate) 2)  ! Darvon-N 3)  ! Sulfacetamide Sodium-Sulfur (Sulfacetamide Sodium-Sulfur)  Past History:  Past Medical History: Last updated: 08/07/2009 CEA:  right Dr. Amedeo Plenty. IMPRESSION: duplex 12/2007 1. Patent right ICA with no restenosis, status post CAD. 2. A 40-59% in-stent stenosis noted on the left ICA. 3. Left ECA stenosis.   HYPERTENSION (ICD-401.9)Dr [name] HYPERLIPIDEMIA (B2193296.4) RENAL INSUFFICIENCY (ICD-588.9) DIVERTICULOSIS OF COLON WITH HEMORRHAGE (  ICD-562.12) DEHYDRATION (VOLUME DEPLETION) (ICD-276.51) DIARRHEA (ICD-787.91) UNSPECIFIED CONTUSION OF EYE (ICD-921.9) SYNCOPE (ICD-780.2) 03/2008 vasavagal.   UNSPECIFIED DISORDER OF URETHRA&URINARY TRACT (ICD-599.9) VITAMIN D DEFICIENCY (ICD-268.9) PYELONEPHRITIS (ICD-590.80) COUGH (ICD-786.2) ASTHMA (ICD-493.90) FATIGUE (ICD-780.79) INSOMNIA, PERSISTENT (ICD-307.42) LOW BACK PAIN (ICD-724.2) Dr Alvan Dame GERD (ICD-530.81)  Dr  Earlean Shawl DIVERTICULOSIS, COLON (ICD-562.10) - bleed 2010 DEPRESSION (ICD-311) ANXIETY (ICD-300.00) ANEMIA-NOS (ICD-285.9)-NOS poss related to CRI, on shots now- 2009, bone marrow bx 2009  Dr Ralene Ok on Aranesp PANCREATITIS (ICD-577.0) OSTEOMYELITIS (ICD-730.20)  Past Surgical History: Last updated: 10/21/2008 Bone marrow bx status post right carotid enterectomy.   Family History: Last updated: 06/23/2007 Family History Hypertension  Social History: Last updated: 06/23/2007 Retired Single Never Smoked Occupation: part time Alcohol use-yes  Review of Systems       Denies fever, malais, weight loss, blurry vision, decreased visual acuity, cough, sputum, SOB, hemoptysis, pleuritic pain, palpitaitons, heartburn, abdominal pain, melena, lower extremity edema, claudication, or rash.   Vital Signs:  Patient profile:   77 year old female Height:      60 inches Weight:      176 pounds BMI:     34.50 Pulse rate:   71 / minute Resp:     16 per minute BP sitting:   135 / 74  (left arm)  Vitals Entered By: Burnett Kanaris (October 31, 2009 10:47 AM)  Physical Exam  General:  Affect appropriate Healthy:  appears stated age 77: normal Neck supple with no adenopathy JVP normal no bruits no thyromegaly Lungs clear with no wheezing and good diaphragmatic motion Heart:  S1/S2 no murmur,rub, gallop or click PMI normal Abdomen: benighn, BS positve, no tenderness, no AAA no bruit.  No HSM or HJR Distal pulses intact with no bruits No edema Neuro non-focal Skin warm and dry S/P left CEA   Impression & Recommendations:  Problem # 1:  CAROTID ARTERY DISEASE (ICD-433.10) F/U duplex 10/12  Try to get on baby ASA now that diverticular bleed is old and stable.  Problem # 2:  HYPERTENSION (ICD-401.9) Well controlled  Get info on [facility] Study Her updated medication list for this problem includes:    Amlodipine Besylate 2.5 Mg Tabs (Amlodipine besylate) .[name]... Take 1 q am     Diovan 80 Mg Tabs (Valsartan) .[name]... Take 1 q am    Chlorthalidone 25 Mg Tabs (Chlorthalidone) .[name]... Take 1 q am  Her updated medication list for this problem includes:    Amlodipine Besylate 2.5 Mg Tabs (Amlodipine besylate) .[name]... Take 1 q am    Diovan 80 Mg Tabs (Valsartan) .[name]... Take 1 q am    Chlorthalidone 25 Mg Tabs (Chlorthalidone) .[name]... Take 1 q am  Problem # 3:  HYPERLIPIDEMIA (ICD-272.4) Continue statin  no compliations Her updated medication list for this problem includes:    Lipitor 20 Mg Tabs (Atorvastatin calcium) .[name]... Take one tablet by mouth daily.  Her updated medication list for this problem includes:    Lipitor 20 Mg Tabs (Atorvastatin calcium) .[name]... Take one tablet by mouth daily.  Patient Instructions: 1)  Your physician recommends that you schedule a follow-up appointment in: [place] Immunizations   Influenza vaccine: Not documented    Tetanus booster: Not documented    Pneumococcal vaccine: Not documented    H. zoster vaccine: Not documented  Colorectal Screening   Hemoccult: Not documented    Colonoscopy: Not documented  Other Screening   Pap smear: Not documented  Mammogram: ASSESSMENT: Negative - BI-RADS 1^MM DIGITAL SCREENING  (11/14/2008)    DXA bone density scan: Not documented   Smoking status: never  (04/10/2009)  Lipids   Total Cholesterol: Not documented   LDL: Not documented   LDL Direct: Not documented   HDL: Not documented   Triglycerides: Not documented    SGOT (AST): 28  (12/21/2007)   SGPT (ALT): 17  (12/21/2007)   Alkaline phosphatase: 95  (12/21/2007)   Total bilirubin: 0.8  (12/21/2007)  Hypertension   Last Blood Pressure: 135 / 74  (10/31/2009)   Serum creatinine: 1.3  (08/03/2009)   Serum potassium 4.4  (08/03/2009)  Self-Management Support :    Hypertension self-management support: Not documented    Lipid self-management support: Not documented

## 2010-02-20 NOTE — Assessment & Plan Note (Signed)
Summary: severe cough,insomnia,runny nose congestion-lb   Vital Signs:  Patient profile:   77 year old female Height:      60 inches (152.40 cm) Weight:      176.0 pounds (80 kg) O2 Sat:      95 % on Room air Temp:     98.8 degrees F (37.11 degrees C) oral Pulse rate:   92 / minute BP sitting:   144 / 70  (left arm) Cuff size:   regular  Vitals Entered By: Tomma Lightning (March 20, 2009 10:26 AM)  O2 Flow:  Room air CC: Cough/ Nasal congestion x's 4 days, also pt states she is not sleeping have'nt slept since Thurs, URI symptoms Is Patient Diabetic? No Pain Assessment Patient in pain? no        Primary Care Provider:  [name] Plotnikov MD  CC:  Cough/ Nasal congestion x's 4 days, also pt states she is not sleeping have'nt slept since Thurs, and URI symptoms.  History of Present Illness:  URI Symptoms      This is a 77 year old woman who presents with URI symptoms.  The symptoms began 5 days ago.  The severity is described as moderate.  The patient reports nasal congestion, dry cough, productive cough, and sick contacts, but denies sore throat and earache.  Associated symptoms include dyspnea.  The patient denies fever, wheezing, and vomiting.  The patient also reports severe fatigue.  The patient denies itchy watery eyes, sneezing, seasonal symptoms, and headache.    Current Medications (verified): 1)  Aciphex 20 Mg  Tbec (Rabeprazole Sodium) .... Once Daily 2)  Fluoxetine Hcl 10 Mg  Caps (Fluoxetine Hcl) .... Take 1 Capsule By Mouth Every Other Day 3)  Lorazepam 1 Mg  Tabs (Lorazepam) .[name].. 1 Tab Qhs  As Needed Anxiety, Insomnia 4)  Aranesp (Albumin Free) 100 Mcg/0.91ml  Soln (Darbepoetin Alfa-Polysorbate) .... Q 3 Wks 5)  Vitamin D3 1000 Unit  Tabs (Cholecalciferol) .... 2 By Mouth Daily 6)  Vitamin B-12 1000 Mcg Tabs (Cyanocobalamin) .... Once Daily 7)  Tessalon Perles 100 Mg Caps (Benzonatate) .[name].. 1-2 By Mouth Two Times A Day As Needed Cogh 8)  Lipitor 20 Mg Tabs  (Atorvastatin Calcium) .... Take One Tablet By Mouth Daily. 9)  Promethazine Vc 6.25-5 Mg/48ml Syrp (Promethazine-Phenylephrine) .... 5 Cc By Mouth Every 4-6hours As Needed For Cough Symptoms - Will Cause Sedation 10)  Proair Hfa 108 (90 Base) Mcg/act Aers (Albuterol Sulfate) .[name].. 1-2 Puffs Every 4 Hours As Needed For Breathing 11)  Amlodipine Besylate 2.5 Mg Tabs (Amlodipine Besylate) .... Take 1 Q Am 12)  Diovan 80 Mg Tabs (Valsartan) .... Take 1 Q Am 13)  Chlorthalidone 25 Mg Tabs (Chlorthalidone) .... Take 1 Q Am  Allergies (verified): 1)  ! Codeine Sulfate (Codeine Sulfate) 2)  ! Darvon-N 3)  ! Sulfacetamide Sodium-Sulfur (Sulfacetamide Sodium-Sulfur)  Past History:  Past Medical History: CEA:  right Dr. Amedeo Plenty. IMPRESSION: duplex 12/2007 1. Patent right ICA with no restenosis, status post CAD. 2. A 40-59% in-stent stenosis noted on the left ICA. 3. Left ECA stenosis.   HYPERTENSION (ICD-401.9)Dr [name] HYPERLIPIDEMIA (B2193296.4) RENAL INSUFFICIENCY (ICD-588.9) DIVERTICULOSIS OF COLON WITH HEMORRHAGE (ICD-562.12) DEHYDRATION (VOLUME DEPLETION) (ICD-276.51) DIARRHEA (ICD-787.91) UNSPECIFIED CONTUSION OF EYE (ICD-921.9) SYNCOPE (ICD-780.2) 03/2008 vasavagal.   UNSPECIFIED DISORDER OF URETHRA&URINARY TRACT (ICD-599.9) VITAMIN D DEFICIENCY (ICD-268.9) PYELONEPHRITIS (ICD-590.80) COUGH (ICD-786.2) ASTHMA (ICD-493.90) FATIGUE (ICD-780.79) INSOMNIA, PERSISTENT (ICD-307.42) LOW BACK PAIN (ICD-724.2) Dr Alvan Dame GERD (ICD-530.81)  Dr Earlean Shawl DIVERTICULOSIS, COLON (ICD-562.10) -  bleed 2010 DEPRESSION (ICD-311) ANXIETY (ICD-300.00) ANEMIA-NOS (ICD-285.9)-NOS  on shots now- 2009, bone marrow bx 2009  Dr Ralene Ok on Aranesp PANCREATITIS (ICD-577.0) OSTEOMYELITIS (ICD-730.20)  Review of Systems  The patient denies weight loss, decreased hearing, and abdominal pain.    Physical Exam  General:  Well-developed,well-nourished,in no acute distress; alert,appropriate and cooperative  throughout examination Eyes:  vision grossly intact; pupils equal, round and reactive to light.  conjunctiva and lids normal.    Ears:  normal pinnae bilaterally, without erythema, swelling, or tenderness to palpation. TMs clear, without effusion, or cerumen impaction. Hearing grossly normal bilaterally  Mouth:  teeth and gums in good repair; mucous membranes moist, without lesions or ulcers. oropharynx clear without exudate, mod erythema.  Lungs:  normal respiratory effort, no intercostal retractions or use of accessory muscles; normal breath sounds bilaterally - no crackles and no wheezes.    Heart:  normal rate, regular rhythm, no murmur, and no rub. BLE without edema.    Impression & Recommendations:  Problem # 1:  BRONCHITIS NOT SPECIFIED AS ACUTE OR CHRONIC (ICD-490)  completing Zpack now (given to pt by her optho doc s/p cataract surg 4 days ago) add coug syrup to help with night symptoms and insomnia Her updated medication list for this problem includes:    Tessalon Perles 100 Mg Caps (Benzonatate) .[name]... 1-2 by mouth two times a day as needed cogh    Promethazine Vc 6.25-5 Mg/22ml Syrp (Promethazine-phenylephrine) .Marland KitchenMarland KitchenMarland KitchenMarland Kitchen 5 cc by mouth every 4-6hours as needed for cough symptoms - will cause sedation    Proair Hfa 108 (90 Base) Mcg/act Aers (Albuterol sulfate) .[name]... 1-2 puffs every 4 hours as needed for breathing  Take antibiotics and other medications as directed. Encouraged to push clear liquids, get enough rest, and take acetaminophen as needed. To be seen in 5-7 days if no improvement, sooner if worse.  Orders: Prescription Created Electronically [phone])  Complete Medication List: 1)  Aciphex 20 Mg Tbec (Rabeprazole sodium) .... Once daily 2)  Fluoxetine Hcl 10 Mg Caps (Fluoxetine hcl) .... Take 1 capsule by mouth every other day 3)  Lorazepam 1 Mg Tabs (Lorazepam) .[name].. 1 tab qhs  as needed anxiety, insomnia 4)  Aranesp (albumin Free) 100 Mcg/0.83ml Soln (Darbepoetin  alfa-polysorbate) .... Q 3 wks 5)  Vitamin D3 1000 Unit Tabs (Cholecalciferol) .... 2 by mouth daily 6)  Vitamin B-12 1000 Mcg Tabs (Cyanocobalamin) .... Once daily 7)  Tessalon Perles 100 Mg Caps (Benzonatate) .[name].. 1-2 by mouth two times a day as needed cogh 8)  Lipitor 20 Mg Tabs (Atorvastatin calcium) .... Take one tablet by mouth daily. 9)  Promethazine Vc 6.25-5 Mg/14ml Syrp (Promethazine-phenylephrine) .... 5 cc by mouth every 4-6hours as needed for cough symptoms - will cause sedation 10)  Proair Hfa 108 (90 Base) Mcg/act Aers (Albuterol sulfate) .[name].. 1-2 puffs every 4 hours as needed for breathing 11)  Amlodipine Besylate 2.5 Mg Tabs (Amlodipine besylate) .... Take 1 q am 12)  Diovan 80 Mg Tabs (Valsartan) .... Take 1 q am 13)  Chlorthalidone 25 Mg Tabs (Chlorthalidone) .... Take 1 q am  Patient Instructions: 1)  complete the Zpack you are taking 2)  new cough syrup and inhaler as discussed - your prescriptions have been electronically submitted to your pharmacy. Please take as directed. Contact our office if you believe you're having problems with the medication(s).  3)  will update your medication list with medications provided for blood pressure study 4)  Get plenty of rest, drink lots of  clear liquids, and use Tylenol or Ibuprofen for fever and comfort. Return in 7-10 days if you're not better:sooner if you're feeling worse. Prescriptions: PROAIR HFA 108 (90 BASE) MCG/ACT AERS (ALBUTEROL SULFATE) 1-2 puffs every 4 hours as needed for breathing  #1 x 0   Entered and Authorized by:   Rowe Clack MD   Signed by:   Rowe Clack MD on 03/20/2009   Method used:   Electronically to        [facility] (retail)       [address]       [place], [place]  [address]       Ph: [phone] or [phone]       Fax: [phone]   RxID:   [id number] PROMETHAZINE VC 6.25-5 MG/5ML SYRP (PROMETHAZINE-PHENYLEPHRINE) 5 cc by mouth every 4-6hours  as needed for cough symptoms - will cause sedation  #120cc x 0   Entered and Authorized by:   Rowe Clack MD   Signed by:   Rowe Clack MD on 03/20/2009   Method used:   Electronically to        [facility] (retail)       [address]       [place],   [address]       Ph: [phone] or [phone]       Fax: [phone]   RxID:   [id number]

## 2010-02-20 NOTE — Letter (Signed)
Summary: [facility]  [facility]   Imported By: Phillis Knack 06/09/2009 08:45:36  _____________________________________________________________________  External Attachment:    Type:   Image     Comment:   External Document

## 2010-02-20 NOTE — Progress Notes (Signed)
Summary: pt needs lipitor   Phone Note Call from Patient Call back at Home Phone [phone]   Caller: Patient Reason for Call: Talk to Nurse, Talk to Doctor Summary of Call: per pt call was wondering did we have samples og lipitor if not can she get another voucher Initial call taken by: Shelda Pal,  April 11, 2009 10:43 AM  Follow-up for Phone Call        gave pt samples also called pt left message saying she can come get samples Follow-up by: Burnett Kanaris,  April 11, 2009 11:35 AM

## 2010-02-20 NOTE — Progress Notes (Signed)
Summary: returning call  Medications Added LIPITOR 20 MG TABS (ATORVASTATIN CALCIUM) Take one tablet by mouth daily.       Phone Note Call from Patient Call back at Home Phone [phone]   Caller: Patient Reason for Call: Talk to Nurse Summary of Call: returning call, spoke with Minerva Fester, Dr Nemiah Commander, Dr [name] will treat for cholesterol Initial call taken by: Darnell Level,  February 22, 2009 9:56 AM  Follow-up for Phone Call        pt returning call, Darnell Level  February 22, 2009 3:30 PM  spoke with pt, she will restart lipitor 20mg  daily. Fredia Beets, RN  February 23, 2009 6:16 PM        New/Updated Medications: LIPITOR 20 MG TABS (ATORVASTATIN CALCIUM) Take one tablet by mouth daily. Prescriptions: LIPITOR 20 MG TABS (ATORVASTATIN CALCIUM) Take one tablet by mouth daily.  #30 x 0   Entered by:   Fredia Beets, RN   Authorized by:   Bosie Clos, MD, [facility]   Signed by:   Fredia Beets, RN on 02/23/2009   Method used:   Print then Give to Patient   RxID:   [id number]

## 2010-02-20 NOTE — Assessment & Plan Note (Signed)
Summary: COUGH/NWS   Vital Signs:  Patient profile:   77 year old female Weight:      178 pounds Temp:     97.4 degrees F oral Pulse rate:   89 / minute BP sitting:   136 / 64  (left arm)  Vitals Entered By: Doralee Albino (February 14, 2009 4:40 PM) CC: cough  Is Patient Diabetic? No   Primary Care Provider:  Cassandria Anger MD  CC:  cough .  History of Present Illness: C/o ST and hoarsness w/cough x 2 wks F/u HTN, asthma  Preventive Screening-Counseling & Management  Alcohol-Tobacco     Smoking Status: never  Current Medications (verified): 1)  Aciphex 20 Mg  Tbec (Rabeprazole Sodium) .... Once Daily 2)  Xopenex Hfa 45 Mcg/act  Aero (Levalbuterol Tartrate) .... 2 Puffs Q 4 Hr As Needed 3)  Fluoxetine Hcl 10 Mg  Caps (Fluoxetine Hcl) .... Take 1 Capsule By Mouth Every Other Day 4)  Lorazepam 1 Mg  Tabs (Lorazepam) .[name].. 1 Tab Qhs  As Needed Anxiety, Insomnia 5)  Aranesp (Albumin Free) 100 Mcg/0.37ml  Soln (Darbepoetin Alfa-Polysorbate) .... Q 3 Wks 6)  Vitamin D3 1000 Unit  Tabs (Cholecalciferol) .... 2 By Mouth Daily 7)  Benicar Hct 20-12.5 Mg Tabs (Olmesartan Medoxomil-Hctz) .[name].. 1 Tab By Mouth Once Daily 8)  Vitamin B-12 1000 Mcg Tabs (Cyanocobalamin) .... Once Daily  Allergies: 1)  ! Codeine Sulfate (Codeine Sulfate) 2)  ! Darvon-N (Propoxyphene Napsylate) 3)  ! Sulfacetamide Sodium-Sulfur (Sulfacetamide Sodium-Sulfur)  Past History:  Past Medical History: Last updated: 10/21/2008 Current Problems:  CEA:  right Dr. Amedeo Plenty. IMPRESSION: duplex 12/2007 1. Patent right ICA with no restenosis, status post CAD. 2. A 40-59% in-stent stenosis noted on the left ICA. 3. Left ECA stenosis.   HYPERTENSION (ICD-401.9)Dr [name] HYPERLIPIDEMIA (B2193296.4) RENAL INSUFFICIENCY (ICD-588.9) DIVERTICULOSIS OF COLON WITH HEMORRHAGE (ICD-562.12) DEHYDRATION (VOLUME DEPLETION) (ICD-276.51) DIARRHEA (ICD-787.91) UNSPECIFIED CONTUSION OF EYE (ICD-921.9) SYNCOPE (ICD-780.2)  03/2008 vasavagal.   UNSPECIFIED DISORDER OF URETHRA&URINARY TRACT (ICD-599.9) VITAMIN D DEFICIENCY (ICD-268.9) PYELONEPHRITIS (ICD-590.80) COUGH (ICD-786.2) ASTHMA (ICD-493.90) FATIGUE (ICD-780.79) INSOMNIA, PERSISTENT (ICD-307.42) LOW BACK PAIN (ICD-724.2) Dr Alvan Dame GERD (ICD-530.81)  Dr Earlean Shawl DIVERTICULOSIS, COLON (ICD-562.10) - bleed 2010 DEPRESSION (ICD-311) ANXIETY (ICD-300.00) ANEMIA-NOS (ICD-285.9)-NOS  on shots now- 2009, bone marrow bx 2009  Dr Ralene Ok on Aranesp PANCREATITIS (ICD-577.0) OSTEOMYELITIS (ICD-730.20)  Social History: Last updated: 06/23/2007 Retired Single Never Smoked Occupation: part time Alcohol use-yes  Review of Systems  The patient denies fever and weight loss.    Physical Exam  General:  Well-developed,well-nourished,in no acute distress; alert,appropriate and cooperative throughout examination Mouth:  Erythematous throat mucosa and intranasal erythema.  Lungs:  Normal respiratory effort, chest expands symmetrically. Lungs are clear to auscultation, no crackles or wheezes. Heart:  Normal rate and regular rhythm. S1 and S2 normal without gallop, murmur, click, rub or other extra sounds. Abdomen:  Bowel sounds positive,abdomen soft and non-tender without masses, organomegaly or hernias noted. Neurologic:  alert & oriented X3, gait normal, and DTRs symmetrical and normal.   Skin:  WNL   Impression & Recommendations:  Problem # 1:  LARYNGITIS, ACUTE (ICD-464.00) Assessment New Zpac Tessalon  Problem # 2:  HYPERTENSION (ICD-401.9) Assessment: Unchanged She will join a study at [facility] Her updated medication list for this problem includes:    Benicar Hct 20-12.5 Mg Tabs (Olmesartan medoxomil-hctz) .[name]... 1 tab by mouth once daily The office visit took longer than 20 min with patient councelling for more than 50% of the 20 min  BP today: 136/64 Prior BP: 130/72 (12/27/2008)  Labs Reviewed: K+: 4.3 (07/19/2008) Creat: : 1.6 (07/19/2008)      Problem # 3:  ASTHMA (ICD-493.90) Assessment: Deteriorated  Her updated medication list for this problem includes:    Xopenex Hfa 45 Mcg/act Aero (Levalbuterol tartrate) .[name]... 2 puffs q 4 hr as needed  Complete Medication List: 1)  Aciphex 20 Mg Tbec (Rabeprazole sodium) .... Once daily 2)  Xopenex Hfa 45 Mcg/act Aero (Levalbuterol tartrate) .... 2 puffs q 4 hr as needed 3)  Fluoxetine Hcl 10 Mg Caps (Fluoxetine hcl) .... Take 1 capsule by mouth every other day 4)  Lorazepam 1 Mg Tabs (Lorazepam) .[name].. 1 tab qhs  as needed anxiety, insomnia 5)  Aranesp (albumin Free) 100 Mcg/0.51ml Soln (Darbepoetin alfa-polysorbate) .... Q 3 wks 6)  Vitamin D3 1000 Unit Tabs (Cholecalciferol) .... 2 by mouth daily 7)  Benicar Hct 20-12.5 Mg Tabs (Olmesartan medoxomil-hctz) .[name].. 1 tab by mouth once daily 8)  Vitamin B-12 1000 Mcg Tabs (Cyanocobalamin) .... Once daily 9)  Tessalon Perles 100 Mg Caps (Benzonatate) .[name].. 1-2 by mouth two times a day as needed cogh 10)  Zithromax Z-pak 250 Mg Tabs (Azithromycin) .... As dirrected  Patient Instructions: 1)  Voice rest 2)  Call if you are not better in a reasonable amount of time or if worse.  Prescriptions: XOPENEX HFA 45 MCG/ACT  AERO (LEVALBUTEROL TARTRATE) 2 puffs q 4 hr as needed  #1 x 3   Entered and Authorized by:   Cassandria Anger MD   Signed by:   Cassandria Anger MD on 02/14/2009   Method used:   Electronically to        [facility] (retail)       [address]       [place], [place]  [address]       Ph: [phone] or [phone]       Fax: [phone]   RxID:   [id number] ZITHROMAX Z-PAK 250 MG TABS (AZITHROMYCIN) as dirrected  #1 x 0   Entered and Authorized by:   Cassandria Anger MD   Signed by:   Cassandria Anger MD on 02/14/2009   Method used:   Electronically to        [facility] (retail)       [address]       [place], [place]   [address]       Ph: [phone] or [phone]       Fax: [phone]   RxID:   [id number] TESSALON PERLES 100 MG CAPS (Gay) 1-2 by mouth two times a day as needed cogh  #60 x 1   Entered and Authorized by:   Cassandria Anger MD   Signed by:   Cassandria Anger MD on 02/14/2009   Method used:   Electronically to        [facility] (retail)       [address]       [place],   [address]       Ph: [phone] or [phone]       Fax: [phone]   RxID:   [id number]

## 2010-02-22 NOTE — Assessment & Plan Note (Signed)
Summary: 3 MTH FU---STC   Vital Signs:  Patient profile:   77 year old female Height:      60 inches Weight:      176 pounds BMI:     34.50 Temp:     97.3 degrees F oral Pulse rate:   88 / minute Pulse rhythm:   regular Resp:     16 per minute BP sitting:   132 / 70  (left arm) Cuff size:   regular  Vitals Entered By: Jonathon Resides, CMA(AAMA) (February 01, 2010 10:24 AM) CC: 3 mo f/u  Is Patient Diabetic? No   Primary Care Provider:  Cassandria Anger MD  CC:  3 mo f/u .  History of Present Illness: The patient presents for a follow up of hypertension, Carot art disease, hyperlipidemia, depression. She stopped antidepr and lorazepam. C/o cracked skin on fingers  Current Medications (verified): 1)  Aciphex 20 Mg  Tbec (Rabeprazole Sodium) .... As Needed 2)  Lorazepam 1 Mg  Tabs (Lorazepam) .... 1/2  Tab Qhs  As Needed Anxiety, Insomnia 3)  Aranesp (Albumin Free) 100 Mcg/0.28ml  Soln (Darbepoetin Alfa-Polysorbate) .... Q 3 Wks 4)  Vitamin D3 1000 Unit  Tabs (Cholecalciferol) .[name].. 1 By Mouth Daily 5)  Vitamin B-12 1000 Mcg Tabs (Cyanocobalamin) .... Once Daily 6)  Lipitor 20 Mg Tabs (Atorvastatin Calcium) .... Take One Tablet By Mouth Daily. 7)  Proair Hfa 108 (90 Base) Mcg/act Aers (Albuterol Sulfate) .[name].. 1-2 Puffs Every 4 Hours As Needed For Breathing 8)  Amlodipine Besylate 2.5 Mg Tabs (Amlodipine Besylate) .... 2 By Mouth Once Daily 9)  Diovan 80 Mg Tabs (Valsartan) .... Take 1 By Mouth Once Daily in The Morning 10)  Chlorthalidone 25 Mg Tabs (Chlorthalidone) .... Take 1 By Mouth Once Daily 11)  Aspirin 81 Mg Tbec (Aspirin) .[name].. 1 By Mouth Qd 12)  Fish Oil Burp-Less 1200 Mg Caps (Omega-3 Fatty Acids) .... Take 2 By Mouth Once Daily  Allergies (verified): 1)  ! Codeine Sulfate (Codeine Sulfate) 2)  ! Darvon-N 3)  ! Sulfacetamide Sodium-Sulfur (Sulfacetamide Sodium-Sulfur)  Past History:  Past Medical History: Last updated: 08/07/2009 CEA:  right Dr. Amedeo Plenty.  IMPRESSION: duplex 12/2007 1. Patent right ICA with no restenosis, status post CAD. 2. A 40-59% in-stent stenosis noted on the left ICA. 3. Left ECA stenosis.   HYPERTENSION (ICD-401.9)Dr [name] HYPERLIPIDEMIA (B2193296.4) RENAL INSUFFICIENCY (ICD-588.9) DIVERTICULOSIS OF COLON WITH HEMORRHAGE (ICD-562.12) DEHYDRATION (VOLUME DEPLETION) (ICD-276.51) DIARRHEA (ICD-787.91) UNSPECIFIED CONTUSION OF EYE (ICD-921.9) SYNCOPE (ICD-780.2) 03/2008 vasavagal.   UNSPECIFIED DISORDER OF URETHRA&URINARY TRACT (ICD-599.9) VITAMIN D DEFICIENCY (ICD-268.9) PYELONEPHRITIS (ICD-590.80) COUGH (ICD-786.2) ASTHMA (ICD-493.90) FATIGUE (ICD-780.79) INSOMNIA, PERSISTENT (ICD-307.42) LOW BACK PAIN (ICD-724.2) Dr Alvan Dame GERD (ICD-530.81)  Dr Earlean Shawl DIVERTICULOSIS, COLON (ICD-562.10) - bleed 2010 DEPRESSION (ICD-311) ANXIETY (ICD-300.00) ANEMIA-NOS (ICD-285.9)-NOS poss related to CRI, on shots now- 2009, bone marrow bx 2009  Dr Ralene Ok on Aranesp PANCREATITIS (ICD-577.0) OSTEOMYELITIS (ICD-730.20)  Social History: Last updated: 11/02/2009 Retired Single Never Smoked Occupation: part time Alcohol use-yes Regular exercise-yes for seniors  Review of Systems  The patient denies chest pain, peripheral edema, transient blindness, difficulty walking, and depression.    Physical Exam  General:  Well-developed,well-nourished,in no acute distress; alert,appropriate and cooperative throughout examination Nose:  External nasal examination shows no deformity or inflammation. Nasal mucosa are pink and moist without lesions or exudates. Mouth:  teeth and gums in good repair; mucous membranes moist, without lesions or ulcers. oropharynx clear without exudate, mod erythema.  Neck:  No deformities, masses, or  tenderness noted. Lungs:  normal respiratory effort, no intercostal retractions or use of accessory muscles; normal breath sounds bilaterally - no crackles and no wheezes.    Heart:  normal rate, regular  rhythm, no murmur, and no rub. BLE without edema.  Abdomen:  Bowel sounds positive,abdomen soft and non-tender without masses, organomegaly or hernias noted. Msk:   scalp is not  tender Neurologic:  alert & oriented X3, gait normal, and DTRs symmetrical and normal.   Skin:  cracked skin on fingers Psych:  Oriented X3.  depressed affect and tearful.     Impression & Recommendations:  Problem # 1:  CAROTID ARTERY DISEASE (ICD-433.10) Assessment Unchanged  Her updated medication list for this problem includes:    Aspirin 81 Mg Tbec (Aspirin) .[name]... 1 by mouth qd  Problem # 2:  HYPERTENSION (ICD-401.9) Assessment: Unchanged  Her updated medication list for this problem includes:    Amlodipine Besylate 2.5 Mg Tabs (Amlodipine besylate) .[name]... 2 by mouth once daily    Diovan 80 Mg Tabs (Valsartan) .[name]... Take 1 by mouth once daily in the morning    Chlorthalidone 25 Mg Tabs (Chlorthalidone) .[name]... Take 1 by mouth once daily  BP today: 132/70 Prior BP: 130/60 (11/02/2009)  Labs Reviewed: K+: 4.4 (08/03/2009) Creat: : 1.3 (08/03/2009)     Problem # 3:  HYPERLIPIDEMIA (ICD-272.4) Assessment: Unchanged  Her updated medication list for this problem includes:    Lipitor 20 Mg Tabs (Atorvastatin calcium) .[name]... Take one tablet by mouth daily.  Problem # 4:  DEPRESSION (ICD-311) Assessment: Improved  The following medications were removed from the medication list:    Lorazepam 1 Mg Tabs (Lorazepam) .[name]... 1/2  tab qhs  as needed anxiety, insomnia  Problem # 5:  ANEMIA-NOS (ICD-285.9) Assessment: Improved  Her updated medication list for this problem includes:    Aranesp (albumin Free) 100 Mcg/0.46ml Soln (Darbepoetin alfa-polysorbate) ..... Q 4 wks    Vitamin B-12 1000 Mcg Tabs (Cyanocobalamin) ..... Once daily  Problem # 6:  SKIN RASH (ICD-782.1) dry fingers Assessment: New  Her updated medication list for this problem includes:    Triamcinolone Acetonide 0.5 % Oint (Triamcinolone  acetonide) ..... Use two times a day as needed on rash  Complete Medication List: 1)  Aciphex 20 Mg Tbec (Rabeprazole sodium) .... As needed 2)  Aranesp (albumin Free) 100 Mcg/0.45ml Soln (Darbepoetin alfa-polysorbate) .... Q 3 wks 3)  Vitamin D3 1000 Unit Tabs (Cholecalciferol) .[name].. 1 by mouth daily 4)  Vitamin B-12 1000 Mcg Tabs (Cyanocobalamin) .... Once daily 5)  Lipitor 20 Mg Tabs (Atorvastatin calcium) .... Take one tablet by mouth daily. 6)  Proair Hfa 108 (90 Base) Mcg/act Aers (Albuterol sulfate) .[name].. 1-2 puffs every 4 hours as needed for breathing 7)  Amlodipine Besylate 2.5 Mg Tabs (Amlodipine besylate) .... 2 by mouth once daily 8)  Diovan 80 Mg Tabs (Valsartan) .... Take 1 by mouth once daily in the morning 9)  Chlorthalidone 25 Mg Tabs (Chlorthalidone) .... Take 1 by mouth once daily 10)  Aspirin 81 Mg Tbec (Aspirin) .[name].. 1 by mouth qd 11)  Fish Oil Burp-less 1200 Mg Caps (Omega-3 fatty acids) .... Take 2 by mouth once daily 12)  Triamcinolone Acetonide 0.5 % Oint (Triamcinolone acetonide) .... Use two times a day as needed on rash  Patient Instructions: 1)  Please schedule a follow-up appointment in 4 months well w/labs. Prescriptions: TRIAMCINOLONE ACETONIDE 0.5 % OINT (TRIAMCINOLONE ACETONIDE) use two times a day as needed on rash  #30g  x 3   Entered and Authorized by:   Cassandria Anger MD   Signed by:   Cassandria Anger MD on 02/01/2010   Method used:     RxID:   [id number]    Orders Added: 1)  Est. Patient Level IV [id number]

## 2010-03-07 ENCOUNTER — Encounter (HOSPITAL_BASED_OUTPATIENT_CLINIC_OR_DEPARTMENT_OTHER): Payer: Medicare Other | Admitting: Oncology

## 2010-03-07 ENCOUNTER — Other Ambulatory Visit (HOSPITAL_COMMUNITY): Payer: Self-pay | Admitting: Oncology

## 2010-03-07 DIAGNOSIS — D649 Anemia, unspecified: Secondary | ICD-10-CM

## 2010-03-07 DIAGNOSIS — N289 Disorder of kidney and ureter, unspecified: Secondary | ICD-10-CM

## 2010-03-07 LAB — CBC WITH DIFFERENTIAL/PLATELET
BASO%: 0.4 % (ref 0.0–2.0)
EOS%: 2.2 % (ref 0.0–7.0)
MCH: 31.8 pg (ref 25.1–34.0)
MCHC: 34.2 g/dL (ref 31.5–36.0)
MONO%: 8.4 % (ref 0.0–14.0)
RDW: 13.8 % (ref 11.2–14.5)
lymph#: 1.7 10*3/uL (ref 0.9–3.3)

## 2010-03-20 NOTE — Letter (Signed)
Summary: [employer]   Imported By: Rise Patience 02/27/2010 11:32:32  _____________________________________________________________________  External Attachment:    Type:   Image     Comment:   External Document

## 2010-04-04 ENCOUNTER — Other Ambulatory Visit (HOSPITAL_COMMUNITY): Payer: Self-pay | Admitting: Oncology

## 2010-04-04 ENCOUNTER — Encounter (HOSPITAL_BASED_OUTPATIENT_CLINIC_OR_DEPARTMENT_OTHER): Payer: Medicare Other | Admitting: Oncology

## 2010-04-04 DIAGNOSIS — D649 Anemia, unspecified: Secondary | ICD-10-CM

## 2010-04-04 LAB — CBC WITH DIFFERENTIAL/PLATELET
Basophils Absolute: 0.1 10*3/uL (ref 0.0–0.1)
Eosinophils Absolute: 0.2 10*3/uL (ref 0.0–0.5)
HCT: 36.4 % (ref 34.8–46.6)
HGB: 12.4 g/dL (ref 11.6–15.9)
LYMPH%: 31.7 % (ref 14.0–49.7)
MCV: 94.4 fL (ref 79.5–101.0)
MONO%: 8.8 % (ref 0.0–14.0)
NEUT#: 3.1 10*3/uL (ref 1.5–6.5)
NEUT%: 55.1 % (ref 38.4–76.8)
Platelets: 380 10*3/uL (ref 145–400)
RDW: 15 % — ABNORMAL HIGH (ref 11.2–14.5)

## 2010-04-19 ENCOUNTER — Telehealth: Payer: Self-pay | Admitting: *Deleted

## 2010-04-19 NOTE — Telephone Encounter (Signed)
Ok lorazepam

## 2010-04-19 NOTE — Telephone Encounter (Signed)
Error below: med being requested is Lorazepam 1 mg  # 30.   Not alprazolam.   Last filled 10/12/09.... Please advise ok to Rf

## 2010-04-19 NOTE — Telephone Encounter (Signed)
Pt's pharm called req to Rf alprazolam.  I advised them Dr. Alain Marion removed the medication because the pt stated she stopped taking at her last OV. I also advised them Dr. Alain Marion is out of the office the rest of today and he will return tom...  I will seek his advisement then.

## 2010-04-20 MED ORDER — LORAZEPAM 1 MG PO TABS: 1.0000 mg | ORAL_TABLET | Freq: Every day | ORAL | Status: DC

## 2010-04-20 NOTE — Telephone Encounter (Signed)
rf phoned in

## 2010-04-25 LAB — CBC
MCHC: 33.8 g/dL (ref 30.0–36.0)
MCV: 94.5 fL (ref 78.0–100.0)
RBC: 4.12 MIL/uL (ref 3.87–5.11)
RDW: 15 % (ref 11.5–15.5)

## 2010-04-25 LAB — TYPE AND SCREEN
ABO/RH(D): O POS
Antibody Screen: NEGATIVE

## 2010-04-25 LAB — COMPREHENSIVE METABOLIC PANEL
AST: 27 U/L (ref 0–37)
CO2: 27 mEq/L (ref 19–32)
Calcium: 9.7 mg/dL (ref 8.4–10.5)
Creatinine, Ser: 1.43 mg/dL — ABNORMAL HIGH (ref 0.4–1.2)
GFR calc Af Amer: 43 mL/min — ABNORMAL LOW (ref >60)
GFR calc non Af Amer: 36 mL/min — ABNORMAL LOW (ref >60)

## 2010-04-25 LAB — DIFFERENTIAL
Eosinophils Relative: 3 % (ref 0–5)
Lymphocytes Relative: 26 % (ref 12–46)
Lymphs Abs: 1.3 10*3/uL (ref 0.7–4.0)
Neutrophils Relative %: 59 % (ref 43–77)

## 2010-04-28 LAB — CBC
HCT: 29.1 % — ABNORMAL LOW (ref 36.0–46.0)
HCT: 29.2 % — ABNORMAL LOW (ref 36.0–46.0)
HCT: 32.3 % — ABNORMAL LOW (ref 36.0–46.0)
HCT: 32.5 % — ABNORMAL LOW (ref 36.0–46.0)
Hemoglobin: 10.4 g/dL — ABNORMAL LOW (ref 12.0–15.0)
Hemoglobin: 11.1 g/dL — ABNORMAL LOW (ref 12.0–15.0)
Hemoglobin: 11.1 g/dL — ABNORMAL LOW (ref 12.0–15.0)
Hemoglobin: 9.8 g/dL — ABNORMAL LOW (ref 12.0–15.0)
Hemoglobin: 9.9 g/dL — ABNORMAL LOW (ref 12.0–15.0)
MCHC: 34.1 g/dL (ref 30.0–36.0)
MCHC: 34.2 g/dL (ref 30.0–36.0)
MCHC: 34.2 g/dL (ref 30.0–36.0)
MCHC: 34.4 g/dL (ref 30.0–36.0)
MCV: 92.4 fL (ref 78.0–100.0)
MCV: 92.6 fL (ref 78.0–100.0)
MCV: 92.9 fL (ref 78.0–100.0)
MCV: 94.4 fL (ref 78.0–100.0)
Platelets: 258 10*3/uL (ref 150–400)
Platelets: 262 10*3/uL (ref 150–400)
Platelets: 280 10*3/uL (ref 150–400)
Platelets: 297 10*3/uL (ref 150–400)
Platelets: 311 10*3/uL (ref 150–400)
RBC: 2.85 MIL/uL — ABNORMAL LOW (ref 3.87–5.11)
RBC: 3.19 MIL/uL — ABNORMAL LOW (ref 3.87–5.11)
RBC: 3.22 MIL/uL — ABNORMAL LOW (ref 3.87–5.11)
RBC: 3.45 MIL/uL — ABNORMAL LOW (ref 3.87–5.11)
RDW: 13.6 % (ref 11.5–15.5)
RDW: 13.8 % (ref 11.5–15.5)
RDW: 13.8 % (ref 11.5–15.5)
RDW: 14.3 % (ref 11.5–15.5)
RDW: 14.3 % (ref 11.5–15.5)
WBC: 5.2 10*3/uL (ref 4.0–10.5)
WBC: 6.5 10*3/uL (ref 4.0–10.5)
WBC: 6.7 10*3/uL (ref 4.0–10.5)
WBC: 6.9 10*3/uL (ref 4.0–10.5)
WBC: 8.1 10*3/uL (ref 4.0–10.5)

## 2010-04-28 LAB — DIFFERENTIAL
Basophils Absolute: 0 10*3/uL (ref 0.0–0.1)
Basophils Absolute: 0 10*3/uL (ref 0.0–0.1)
Basophils Absolute: 0 10*3/uL (ref 0.0–0.1)
Basophils Absolute: 0.1 10*3/uL (ref 0.0–0.1)
Basophils Relative: 0 % (ref 0–1)
Basophils Relative: 1 % (ref 0–1)
Eosinophils Absolute: 0.2 10*3/uL (ref 0.0–0.7)
Eosinophils Absolute: 0.3 10*3/uL (ref 0.0–0.7)
Eosinophils Absolute: 0.3 10*3/uL (ref 0.0–0.7)
Eosinophils Relative: 1 % (ref 0–5)
Eosinophils Relative: 2 % (ref 0–5)
Eosinophils Relative: 4 % (ref 0–5)
Eosinophils Relative: 5 % (ref 0–5)
Eosinophils Relative: 5 % (ref 0–5)
Lymphocytes Relative: 14 % (ref 12–46)
Lymphocytes Relative: 22 % (ref 12–46)
Lymphocytes Relative: 26 % (ref 12–46)
Lymphocytes Relative: 26 % (ref 12–46)
Lymphocytes Relative: 34 % (ref 12–46)
Lymphs Abs: 1.1 10*3/uL (ref 0.7–4.0)
Lymphs Abs: 1.7 10*3/uL (ref 0.7–4.0)
Lymphs Abs: 1.8 10*3/uL (ref 0.7–4.0)
Lymphs Abs: 1.8 10*3/uL (ref 0.7–4.0)
Lymphs Abs: 1.8 10*3/uL (ref 0.7–4.0)
Lymphs Abs: 2 10*3/uL (ref 0.7–4.0)
Monocytes Absolute: 0.5 10*3/uL (ref 0.1–1.0)
Monocytes Absolute: 0.5 10*3/uL (ref 0.1–1.0)
Monocytes Absolute: 0.5 10*3/uL (ref 0.1–1.0)
Monocytes Absolute: 0.7 10*3/uL (ref 0.1–1.0)
Monocytes Relative: 5 % (ref 3–12)
Monocytes Relative: 5 % (ref 3–12)
Monocytes Relative: 6 % (ref 3–12)
Monocytes Relative: 7 % (ref 3–12)
Monocytes Relative: 8 % (ref 3–12)
Monocytes Relative: 8 % (ref 3–12)
Neutro Abs: 2.6 10*3/uL (ref 1.7–7.7)
Neutro Abs: 3.6 10*3/uL (ref 1.7–7.7)
Neutro Abs: 4.3 10*3/uL (ref 1.7–7.7)
Neutro Abs: 5 10*3/uL (ref 1.7–7.7)
Neutro Abs: 6.2 10*3/uL (ref 1.7–7.7)
Neutrophils Relative %: 56 % (ref 43–77)
Neutrophils Relative %: 65 % (ref 43–77)
Neutrophils Relative %: 69 % (ref 43–77)

## 2010-04-28 LAB — BASIC METABOLIC PANEL
CO2: 23 mEq/L (ref 19–32)
CO2: 24 mEq/L (ref 19–32)
Calcium: 8.6 mg/dL (ref 8.4–10.5)
Calcium: 8.8 mg/dL (ref 8.4–10.5)
Chloride: 112 mEq/L (ref 96–112)
GFR calc Af Amer: 37 mL/min — ABNORMAL LOW (ref >60)
GFR calc Af Amer: 54 mL/min — ABNORMAL LOW (ref >60)
GFR calc non Af Amer: 31 mL/min — ABNORMAL LOW (ref >60)
GFR calc non Af Amer: 42 mL/min — ABNORMAL LOW (ref >60)
GFR calc non Af Amer: 44 mL/min — ABNORMAL LOW (ref >60)
Glucose, Bld: 132 mg/dL — ABNORMAL HIGH (ref 70–99)
Glucose, Bld: 87 mg/dL (ref 70–99)
Glucose, Bld: 95 mg/dL (ref 70–99)
Potassium: 3.5 mEq/L (ref 3.5–5.1)
Potassium: 3.9 mEq/L (ref 3.5–5.1)
Sodium: 133 mEq/L — ABNORMAL LOW (ref 135–145)
Sodium: 138 mEq/L (ref 135–145)
Sodium: 139 mEq/L (ref 135–145)

## 2010-04-28 LAB — COMPREHENSIVE METABOLIC PANEL
ALT: 16 U/L (ref 0–35)
AST: 24 U/L (ref 0–37)
CO2: 22 mEq/L (ref 19–32)
Calcium: 8 mg/dL — ABNORMAL LOW (ref 8.4–10.5)
Creatinine, Ser: 1.51 mg/dL — ABNORMAL HIGH (ref 0.4–1.2)
GFR calc Af Amer: 41 mL/min — ABNORMAL LOW (ref >60)
GFR calc non Af Amer: 34 mL/min — ABNORMAL LOW (ref >60)
Sodium: 138 mEq/L (ref 135–145)
Total Protein: 5.1 g/dL — ABNORMAL LOW (ref 6.0–8.3)

## 2010-04-28 LAB — TYPE AND SCREEN
ABO/RH(D): O POS
Antibody Screen: NEGATIVE

## 2010-04-28 LAB — VITAMIN B12: Vitamin B-12: 1130 pg/mL — ABNORMAL HIGH (ref 211–911)

## 2010-04-28 LAB — TROPONIN I: Troponin I: 0.02 ng/mL (ref 0.00–0.06)

## 2010-04-28 LAB — ABO/RH: ABO/RH(D): O POS

## 2010-04-28 LAB — IRON AND TIBC
Iron: 60 ug/dL (ref 42–135)
TIBC: 232 ug/dL — ABNORMAL LOW (ref 250–470)
UIBC: 172 ug/dL

## 2010-04-28 LAB — PROTIME-INR: INR: 1.1 (ref 0.00–1.49)

## 2010-04-28 LAB — CK TOTAL AND CKMB (NOT AT ARMC)
CK, MB: 1.8 ng/mL (ref 0.3–4.0)
Relative Index: INVALID (ref 0.0–2.5)
Total CK: 97 U/L (ref 7–177)

## 2010-04-28 LAB — FERRITIN: Ferritin: 170 ng/mL (ref 10–291)

## 2010-04-28 LAB — FOLATE: Folate: 15 ng/mL

## 2010-04-28 LAB — RETICULOCYTES: Retic Count, Absolute: 20.6 10*3/uL (ref 19.0–186.0)

## 2010-05-01 ENCOUNTER — Other Ambulatory Visit (HOSPITAL_COMMUNITY): Payer: Self-pay | Admitting: Oncology

## 2010-05-01 ENCOUNTER — Encounter (HOSPITAL_BASED_OUTPATIENT_CLINIC_OR_DEPARTMENT_OTHER): Payer: Medicare Other | Admitting: Oncology

## 2010-05-01 DIAGNOSIS — D649 Anemia, unspecified: Secondary | ICD-10-CM

## 2010-05-01 LAB — CBC WITH DIFFERENTIAL/PLATELET
BASO%: 0.4 % (ref 0.0–2.0)
Basophils Absolute: 0 10*3/uL (ref 0.0–0.1)
EOS%: 3 % (ref 0.0–7.0)
Eosinophils Absolute: 0.2 10*3/uL (ref 0.0–0.5)
HCT: 34.2 % — ABNORMAL LOW (ref 34.8–46.6)
HGB: 11.8 g/dL (ref 11.6–15.9)
LYMPH%: 25.6 % (ref 14.0–49.7)
MCH: 32.4 pg (ref 25.1–34.0)
MCHC: 34.6 g/dL (ref 31.5–36.0)
MCV: 93.8 fL (ref 79.5–101.0)
MONO#: 0.5 10*3/uL (ref 0.1–0.9)
MONO%: 8.2 % (ref 0.0–14.0)
NEUT#: 4.1 10*3/uL (ref 1.5–6.5)
NEUT%: 62.8 % (ref 38.4–76.8)
Platelets: 350 10*3/uL (ref 145–400)
RBC: 3.65 10*6/uL — ABNORMAL LOW (ref 3.70–5.45)
RDW: 13.9 % (ref 11.2–14.5)
WBC: 6.5 10*3/uL (ref 3.9–10.3)
lymph#: 1.7 10*3/uL (ref 0.9–3.3)

## 2010-05-03 LAB — URINE MICROSCOPIC-ADD ON

## 2010-05-03 LAB — CBC
HCT: 39.5 % (ref 36.0–46.0)
MCHC: 33.9 g/dL (ref 30.0–36.0)
MCV: 93.7 fL (ref 78.0–100.0)
Platelets: 396 10*3/uL (ref 150–400)
RDW: 15.4 % (ref 11.5–15.5)

## 2010-05-03 LAB — COMPREHENSIVE METABOLIC PANEL
Alkaline Phosphatase: 104 U/L (ref 39–117)
BUN: 22 mg/dL (ref 6–23)
CO2: 26 mEq/L (ref 19–32)
Chloride: 101 mEq/L (ref 96–112)
Creatinine, Ser: 1.2 mg/dL (ref 0.4–1.2)
GFR calc non Af Amer: 44 mL/min — ABNORMAL LOW (ref >60)
Glucose, Bld: 112 mg/dL — ABNORMAL HIGH (ref 70–99)
Total Bilirubin: 0.8 mg/dL (ref 0.3–1.2)

## 2010-05-03 LAB — CARDIAC PANEL(CRET KIN+CKTOT+MB+TROPI)
Relative Index: INVALID (ref 0.0–2.5)
Total CK: 99 U/L (ref 7–177)
Troponin I: <0.01 ng/mL (ref 0.00–0.06)

## 2010-05-03 LAB — URINALYSIS, ROUTINE W REFLEX MICROSCOPIC
Ketones, ur: NEGATIVE mg/dL
Nitrite: NEGATIVE
Urobilinogen, UA: 0.2 mg/dL (ref 0.0–1.0)
pH: 5.5 (ref 5.0–8.0)

## 2010-05-03 LAB — PROTIME-INR: INR: 0.9 (ref 0.00–1.49)

## 2010-05-03 LAB — TROPONIN I: Troponin I: 0.01 ng/mL (ref 0.00–0.06)

## 2010-05-29 ENCOUNTER — Encounter (HOSPITAL_BASED_OUTPATIENT_CLINIC_OR_DEPARTMENT_OTHER): Payer: Medicare Other | Admitting: Oncology

## 2010-05-29 ENCOUNTER — Other Ambulatory Visit (HOSPITAL_COMMUNITY): Payer: Self-pay | Admitting: Oncology

## 2010-05-29 DIAGNOSIS — N289 Disorder of kidney and ureter, unspecified: Secondary | ICD-10-CM

## 2010-05-29 DIAGNOSIS — D649 Anemia, unspecified: Secondary | ICD-10-CM

## 2010-05-29 LAB — CBC WITH DIFFERENTIAL/PLATELET
BASO%: 0.5 % (ref 0.0–2.0)
Eosinophils Absolute: 0.1 10*3/uL (ref 0.0–0.5)
LYMPH%: 23.9 % (ref 14.0–49.7)
MCHC: 34.7 g/dL (ref 31.5–36.0)
MCV: 93.1 fL (ref 79.5–101.0)
MONO#: 0.5 10*3/uL (ref 0.1–0.9)
MONO%: 8.5 % (ref 0.0–14.0)
NEUT#: 4 10*3/uL (ref 1.5–6.5)
RBC: 3.29 10*6/uL — ABNORMAL LOW (ref 3.70–5.45)
RDW: 12.9 % (ref 11.2–14.5)
WBC: 6.2 10*3/uL (ref 3.9–10.3)

## 2010-05-30 ENCOUNTER — Encounter: Payer: Self-pay | Admitting: Internal Medicine

## 2010-06-01 ENCOUNTER — Other Ambulatory Visit (INDEPENDENT_AMBULATORY_CARE_PROVIDER_SITE_OTHER): Payer: Medicare Other | Admitting: Internal Medicine

## 2010-06-01 ENCOUNTER — Ambulatory Visit (INDEPENDENT_AMBULATORY_CARE_PROVIDER_SITE_OTHER): Payer: Medicare Other | Admitting: Internal Medicine

## 2010-06-01 ENCOUNTER — Other Ambulatory Visit (INDEPENDENT_AMBULATORY_CARE_PROVIDER_SITE_OTHER): Payer: Medicare Other

## 2010-06-01 ENCOUNTER — Encounter: Payer: Self-pay | Admitting: Internal Medicine

## 2010-06-01 DIAGNOSIS — I10 Essential (primary) hypertension: Secondary | ICD-10-CM

## 2010-06-01 DIAGNOSIS — F411 Generalized anxiety disorder: Secondary | ICD-10-CM

## 2010-06-01 DIAGNOSIS — Z0000 Encounter for general adult medical examination without abnormal findings: Secondary | ICD-10-CM | POA: Insufficient documentation

## 2010-06-01 DIAGNOSIS — M791 Myalgia, unspecified site: Secondary | ICD-10-CM | POA: Insufficient documentation

## 2010-06-01 DIAGNOSIS — N390 Urinary tract infection, site not specified: Secondary | ICD-10-CM

## 2010-06-01 LAB — CBC WITH DIFFERENTIAL/PLATELET
Eosinophils Relative: 2.6 % (ref 0.0–5.0)
HCT: 33.7 % — ABNORMAL LOW (ref 36.0–46.0)
Hemoglobin: 11.6 g/dL — ABNORMAL LOW (ref 12.0–15.0)
Lymphs Abs: 1.6 10*3/uL (ref 0.7–4.0)
MCV: 94.9 fl (ref 78.0–100.0)
Monocytes Absolute: 0.5 10*3/uL (ref 0.1–1.0)
Monocytes Relative: 7.5 % (ref 3.0–12.0)
Neutro Abs: 3.9 10*3/uL (ref 1.4–7.7)
RDW: 13.4 % (ref 11.5–14.6)

## 2010-06-01 LAB — COMPREHENSIVE METABOLIC PANEL
ALT: 25 U/L (ref 0–35)
AST: 31 U/L (ref 0–37)
Albumin: 3.7 g/dL (ref 3.5–5.2)
Alkaline Phosphatase: 85 U/L (ref 39–117)
Calcium: 9.5 mg/dL (ref 8.4–10.5)
Chloride: 99 mEq/L (ref 96–112)
Potassium: 4.1 mEq/L (ref 3.5–5.1)
Sodium: 138 mEq/L (ref 135–145)
Total Protein: 6.8 g/dL (ref 6.0–8.3)

## 2010-06-01 LAB — URINALYSIS, ROUTINE W REFLEX MICROSCOPIC
Bilirubin Urine: NEGATIVE
Hgb urine dipstick: NEGATIVE
Ketones, ur: NEGATIVE
Total Protein, Urine: NEGATIVE
Urine Glucose: NEGATIVE

## 2010-06-01 LAB — LIPID PANEL
LDL Cholesterol: 72 mg/dL (ref 0–99)
VLDL: 10.6 mg/dL (ref 0.0–40.0)

## 2010-06-01 LAB — TSH: TSH: 2.86 u[IU]/mL (ref 0.35–5.50)

## 2010-06-01 MED ORDER — LORAZEPAM 1 MG PO TABS: 1.0000 mg | ORAL_TABLET | Freq: Every day | ORAL | Status: DC

## 2010-06-01 NOTE — Progress Notes (Signed)
Subjective:    Patient ID: [name] [name], female    DOB: 12/09/1931, 77 y.o.   MRN: [id number]  HPI  The patient is here for a wellness exam. The patient has been doing well overall without major physical or psychological issues going on lately. The patient needs to address  chronic hypertension that has been well controlled with medicines; to address chronic  hyperlipidemia controlled with medicines as well; and to address GERD, controlled with medical treatment and diet. C/o pains in B legs  Review of Systems  Constitutional: Positive for fatigue. Negative for chills, activity change and unexpected weight change.  HENT: Negative for nosebleeds, sneezing and neck pain.   Eyes: Negative for itching.  Respiratory: Negative for cough.   Cardiovascular: Negative for leg swelling.  Gastrointestinal: Negative for abdominal distention.  Genitourinary: Negative for enuresis and menstrual problem.  Musculoskeletal: Negative for back pain.  Psychiatric/Behavioral: Negative for suicidal ideas and confusion. The patient is not nervous/anxious.        Objective:   Physical Exam  Constitutional: She appears well-developed and well-nourished. No distress.  HENT:  Head: Normocephalic.  Right Ear: External ear normal.  Left Ear: External ear normal.  Nose: Nose normal.  Mouth/Throat: Oropharynx is clear and moist.  Eyes: Conjunctivae are normal. Pupils are equal, round, and reactive to light. Right eye exhibits no discharge. Left eye exhibits no discharge.  Neck: Normal range of motion. Neck supple. No JVD present. No tracheal deviation present. No thyromegaly present.  Cardiovascular: Normal rate, regular rhythm and normal heart sounds.   Pulmonary/Chest: No stridor. No respiratory distress. She has no wheezes.  Abdominal: Soft. Bowel sounds are normal. She exhibits no distension and no mass. There is no tenderness. There is no rebound and no guarding.  Musculoskeletal: She exhibits no edema  and no tenderness.  Lymphadenopathy:    She has no cervical adenopathy.  Neurological: She displays normal reflexes. No cranial nerve deficit. She exhibits normal muscle tone. Coordination normal.  Skin: No rash noted. No erythema.  Psychiatric: She has a normal mood and affect. Her behavior is normal. Judgment and thought content normal.       Not depressed - "sensitive"          Assessment & Plan:  Well adult exam The patient is here for annual Medicare wellness examination and management of other chronic and acute problems.   The risk factors are reflected in the social history.  The roster of all physicians providing medical care to patient - is listed in the Snapshot section of the chart.  Activities of daily living:  The patient is 100% inedpendent in all ADLs: dressing, toileting, feeding as well as independent mobility  Home safety : The patient has smoke detectors in the home. They wear seatbelts.No firearms at home ( firearms are present in the home, kept in a safe fashion). There is no violence in the home.   There is no risks for hepatitis, STDs or HIV. There is no   history of blood transfusion. They have no travel history to infectious disease endemic areas of the world.  The patient has (has not) seen their dentist in the last six month. They have (not) seen their eye doctor in the last year. They deny (admit to) any hearing difficulty and have not had audiologic testing in the last year.  They do not  have excessive sun exposure. Discussed the need for sun protection: hats, long sleeves and use of sunscreen if there is significant  sun exposure.   Diet: the importance of a healthy diet is discussed. They do have a healthy (unhealthy-high fat/fast food) diet.  The patient has a regular exercise program: ____walking___ , ___1h_duration, ____4_per week.  The benefits of regular aerobic exercise were discussed.  Depression screen: there are no signs or vegative symptoms of  depression- irritability, change in appetite, anhedonia, sadness/tearfullness.  Cognitive assessment: the patient manages all their financial and personal affairs and is actively engaged. They could relate day,date,year and events; recalled 3/3 objects at 3 minutes; performed clock-face test normally.  The following portions of the patient's history were reviewed and updated as appropriate: allergies, current medications, past family history, past medical history,  past surgical history, past social history  and problem list.  Vision, hearing, body mass index were assessed and reviewed.   During the course of the visit the patient was educated and counseled about appropriate screening and preventive services including : fall prevention , diabetes screening, nutrition counseling, colorectal cancer screening, and recommended immunizations.  DT ?2006   HYPERTENSION On Rx  ANXIETY On Rx  LE myalgias  Hold Lipitor x 1-2 wks

## 2010-06-01 NOTE — Assessment & Plan Note (Signed)
On Rx 

## 2010-06-01 NOTE — Assessment & Plan Note (Signed)
The patient is here for annual Medicare wellness examination and management of other chronic and acute problems.   The risk factors are reflected in the social history.  The roster of all physicians providing medical care to patient - is listed in the Snapshot section of the chart.  Activities of daily living:  The patient is 100% inedpendent in all ADLs: dressing, toileting, feeding as well as independent mobility  Home safety : The patient has smoke detectors in the home. They wear seatbelts.No firearms at home ( firearms are present in the home, kept in a safe fashion). There is no violence in the home.   There is no risks for hepatitis, STDs or HIV. There is no   history of blood transfusion. They have no travel history to infectious disease endemic areas of the world.  The patient has (has not) seen their dentist in the last six month. They have (not) seen their eye doctor in the last year. They deny (admit to) any hearing difficulty and have not had audiologic testing in the last year.  They do not  have excessive sun exposure. Discussed the need for sun protection: hats, long sleeves and use of sunscreen if there is significant sun exposure.   Diet: the importance of a healthy diet is discussed. They do have a healthy (unhealthy-high fat/fast food) diet.  The patient has a regular exercise program: ____walking___ , ___1h_duration, ____4_per week.  The benefits of regular aerobic exercise were discussed.  Depression screen: there are no signs or vegative symptoms of depression- irritability, change in appetite, anhedonia, sadness/tearfullness.  Cognitive assessment: the patient manages all their financial and personal affairs and is actively engaged. They could relate day,date,year and events; recalled 3/3 objects at 3 minutes; performed clock-face test normally.  The following portions of the patient's history were reviewed and updated as appropriate: allergies, current medications, past  family history, past medical history,  past surgical history, past social history  and problem list.  Vision, hearing, body mass index were assessed and reviewed.   During the course of the visit the patient was educated and counseled about appropriate screening and preventive services including : fall prevention , diabetes screening, nutrition counseling, colorectal cancer screening, and recommended immunizations.

## 2010-06-03 ENCOUNTER — Telehealth: Payer: Self-pay | Admitting: Internal Medicine

## 2010-06-03 NOTE — Telephone Encounter (Signed)
[name] [name] , please, inform the patient: labs are OK, - disregard Iron - Please, keep  next office visit appointment.   Thank you !

## 2010-06-03 NOTE — Telephone Encounter (Signed)
[name] [name], please, inform patient that all labs are normal except for a very mild anemia - take OTC Iron 325 mg a day x 3 mo Thx

## 2010-06-04 NOTE — Telephone Encounter (Signed)
Pt informed

## 2010-06-05 NOTE — Procedures (Signed)
CAROTID DUPLEX EXAM   INDICATION:  Follow up of carotid artery disease.   HISTORY:  Diabetes:  No.  Cardiac:  Arrhythmia.  Hypertension:  Yes.  Smoking:  No.  Previous Surgery:  Right CEA with DPA on 12/01/2006 by Dr. Amedeo Plenty.  CV History:  Amaurosis Fugax No, Paresthesias No, Hemiparesis No.   TECHNOLOGIST:  Elberta Fortis.   INTERPRETING PHYSICIAN:  Dr. Amedeo Plenty.                                       RIGHT             LEFT  Brachial systolic pressure:         162               160  Brachial Doppler waveforms:         WNL               WNL  Vertebral direction of flow:        Antegrade         Antegrade  DUPLEX VELOCITIES (cm/sec)  CCA peak systolic                   110               94  ECA peak systolic                   141               0000000  ICA peak systolic                   117               [id number]  ICA end diastolic                   39                29  PLAQUE MORPHOLOGY:                                    Calcified  PLAQUE AMOUNT:                                        Moderate to severe  PLAQUE LOCATION:                                      ICA and ECA   IMPRESSION:  1. Patent right ICA with no restenosis, status post CAD.  2. A 40-59% in-stent stenosis noted on the left ICA.  3. Left ECA stenosis.   ___________________________________________  P. Drucie Opitz, M.D.   AC/MEDQ  D:  12/31/2007  T:  12/31/2007  Job:  [id number]

## 2010-06-05 NOTE — Consult Note (Signed)
[name] [name], [name] [name] NO.:  [id number]   MEDICAL RECORD NO.:  [id number]          PATIENT TYPE:  INP   LOCATION:  1233                         FACILITY:  [facility]   PHYSICIAN:  [name], M.D.DATE OF BIRTH:  [birth year]   DATE OF CONSULTATION:  09/08/2008  DATE OF DISCHARGE:                                 CONSULTATION   REASON FOR CONSULTATION:  A 77 year old white female with acute lower GI  bleeding.   HISTORY OF PRESENT ILLNESS:  The patient has been followed for many  years for diverticular disease which resulted in a segmental colonic  resection 10 years ago.  She has not had recurrence.  I performed  colonoscopy September 2008 with findings only residual  diverticulosis.  No colorectal neoplasia.  Her more recent medical issues have revolved  around chronic anemia followed by  Dr Laurey Morale an depression.   Twenty-four hours ago the patient had the acute onset of large volume of  dark bloody stools.  After the third such bowel movement she became  presyncopal and called 9-1-1 who delivered her to the [facility]  emergency room.  She continued to pass large volume bloody stools until  4:00 a.m.  Vital signs remained stable throughout.  There has been no  associated abdominal pain, just  urgency and incontinence.  No upper GI  symptoms of odynophagia, dysphagia, nausea, vomiting or dyspepsia.   Since admission hemoglobin has fallen from 11 to 9.  She received 2  units PRBC and has remained clinically stable.  There has been no  further bleeding for the past 14 hours.  She is resting comfortably,  watching television.  Appetite is good, though she has only had sips of  clear liquid thus far.  No fever, rigor, chill or diaphoresis.  Risk  factors include aspirin 81 mg 4 tablets daily.  She does not use NSAIDS.  She is closely followed by Dr. Ralene Ok for Aranesp and iron  supplements.  Bowel habits usually are regular though iron does tend to  cause  some binding for which she uses stool softener with good success.   PAST MEDICAL HISTORY:  1. GERD.  2. Cerebrovascular disease.  3. Hyperlipidemia.  4. Hypertension.  5. Chronic anemia, asthma.   SOCIAL HISTORY:  The patient lives alone.  She has two daughters, one of  whom is an Therapist, sports and attends to the patient's medical needs.  They are  quite supportive.  Nonsmoker.  Occasional social drink.   FAMILY HISTORY:  Noncontributory.   DRUG ALLERGY:  CODEINE, DARVON, SULFUR.   CURRENT MEDICAL REGIMEN:  Listed in the chart.   PHYSICAL EXAMINATION:  Healthy, appears unchanged, appears stated age.  Alert, oriented, comfortable, conversational, ambulatory, resting in  bed.  VITAL SIGNS:  Stable.  She is afebrile.  Pulse is 78, regular.  HEENT:  Pale conjunctiva.  Anicteric sclerae.  No oral pharyngeal  lesion.  NECK:  Supple.  No bruit.  No adenopathy or thyromegaly.  CHEST:  Clear without adventitious sounds.  CARDIAC:  Regular rhythm.  No gallop, no murmur present.  ABDOMEN:  Obese, soft, nondistended, nontender.  Midline surgical  incision is well-healed.  Ventral hernia present.  Active bowel sounds.  No mass, no firmness.  The abdomen is nontender and nondistended.  RECTAL:  Not performed.  LOWER EXTREMITIES:  Without peripheral edema.  SKIN:  Intact.  NEUROLOGIC:  Without focal deficit.   LABORATORY:  Reviewed.   ASSESSMENT:  Lower GI hemorrhage:  Most consistent with diverticular  bleed.  Known history of diverticulosis, no prior history of colorectal  neoplasia.  No leukocytosis or abdominal pain to suggest ischemic bowel.  Doubt vascular lesion.  Appears to be stabilizing.  The biggest  challenge will be the anticipated wedding of the patient's first  granddaughter in 66 hours.  She would very much like to attend this and  the patient's daughter is concerned that her depression may escalate if  medical issues prevent her attendance.   RECOMMENDATIONS:  1. Continue  supportive measures:  Bed and bowel rest, IV fluids, close      monitoring.  2. Hold the aspirin for the next month.  3. Colonoscopy:  Advised in 1-2 months after the current acute episode      resolves.  4. Return office visit in 1-2 weeks.  5. Advance to full liquid diet tomorrow if there is no bleeding      overnight.  6. Transfuse aggressively to maintain hemoglobin close to 10 recalling      history of chronic anemia, cerebrovascular disease, and anticipated      festivities for which the patient would do well to have a as close      to normal hemogram as possible.  7.  Consider discharge in 24      hours.  7. Call if further assistance needed [phone].      [name], M.D.  Electronically Signed     JRM/MEDQ  D:  09/08/2008  T:  09/08/2008  Job:  [id number]

## 2010-06-05 NOTE — Assessment & Plan Note (Signed)
OFFICE VISIT   [name] [name], [name] [name]  DOB:  13-Oct-1931                                       06/25/2007  [id number]   The patient underwent a right carotid endarterectomy in November 2008.  She is following up here at this time for 70-month evaluation.  The  duplex scan reveals widely patent right carotid endarterectomy site.  Moderate 40-59% left ICA stenosis with some shadowing.  Antegrade  vertebral flow bilaterally.   The patient is free of any neurologic symptoms.  Denies sensory motor or  visual deficit.   She remains on aspirin 325 mg daily.   The patient appears generally well.  BP 138/76, pulse 90 per minute.  No  carotid bruits.  Cranial nerves intact.  Strength equal bilaterally.   Overall doing well following her carotid surgery.  We will plan followup  again in 6 months with a carotid Doppler evaluation.   Dorothea Glassman, M.D.  Electronically Signed   PGH/MEDQ  D:  06/25/2007  T:  06/26/2007  Job:  [id number]   cc:   [name]. Plotnikov, MD  [name]. [name], MD, [facility]

## 2010-06-05 NOTE — Assessment & Plan Note (Signed)
[facility]                            CARDIOLOGY OFFICE NOTE   NAME:[name] [name], [name] [name]                       MRN:          [id number]  DATE:11/14/2006                            DOB:          1931-07-17    [name] [name] returns today for followup.  I have seen her in the past for  palpitations, hypertension, hypercholesterolemia.  Dr. Amedeo Plenty and I have  been following her right carotid stenosis.  She had a diagnostic  angiogram last year and it was thought to be borderline.  However, now  her systolic velocities have regressed over 2.5 and her diastolics are  over [id number].  She is scheduled to have surgery on November 10 for right CEA  and I spent quite a bit of time with Andera explaining the procedure to  her.  She is on an aspirin a day.  Fortunately, she has not had a TIA or  CVA like symptoms.   The patient does not have documented coronary disease.  However, she now  clearly has vascular disease.  Her last Myoview was in 2005.  I think it  would be prudent to do a stress Myoview prior to her carotid surgery.   The patient's risk factors are otherwise well modified.  She has been  compliant with her cholesterol and blood pressure medicine.   REVIEW OF SYSTEMS:  Remarkable for some significant problems over the  last 6 months with left hip pain.  She has chronic back problems as  well.   Otherwise, her review of systems is negative.   CURRENT MEDICATIONS:  1. Include an aspirin a day.  2. Cardizem 240 a day.  3. Lipitor 80 a day.  4. Albuterol p.r.n.  5. Hydrochlorothiazide 12.5.  6. Hyzaar 100/12.5.  7. AcipHex.   Her asthma has been quiescent.   EXAM:  Remarkable for an elderly white female in no distress.  Blood pressure 120/70, pulse 70 and regular, respiratory rate is 14 and  afebrile.  HEENT:  Unremarkable.  Soft right carotid bruit.  NECK:  Supple.  No thyromegaly.  No lymphadenopathy.  No JVP elevation.  LUNGS:  Clear.  Good diaphragmatic  motion.  No wheezing.  S1, S2 with normal heart sounds.  ABDOMEN:  Benign.  Bowel sounds positive.  No tenderness.  No  hepatosplenomegaly.  No hepatojugular reflux.  Femorals are +3 bilaterally.  No femoral bruits.  PT +2.  NEURO:  Nonfocal.  SKIN:  Warm and dry.  No muscular weakness.   EKG shows sinus rhythm with nonspecific ST-T wave changes.   IMPRESSION:  1. Preoperative for right CEA Myoview, likely to be able to be cleared      for surgery.  Continue aspirin therapy.  Has not been on beta      blockers in the past due to asthma.  2. Hypertension, currently well controlled.  Continue Hyzaar and      Cardizem.  3. Right CEA per Dr. Amedeo Plenty.  Good risk factor modification.  Not a      particularly high risk.  4. Hyperlipidemia in the setting of vascular disease.  Continue      Lipitor 80 a day.  Lipid and liver profile in 6 months.  5. History of asthma, currently quiescent.  She has a rescue inhaler      albuterol in case she needs it.  6. History of reflux with peptic ulcer disease.  Continue AcipHex 20      mg a day.   So long as [name] [name] Myoview is normal, I will see her back in 6 months.     [name]. [name], MD, [facility]  Electronically Signed    PCN/MedQ  DD: 11/14/2006  DT: 11/15/2006  Job #: [id number]

## 2010-06-05 NOTE — Assessment & Plan Note (Signed)
OFFICE VISIT   [name] [name], [name] [name]  DOB:  Jul 15, 1931                                       10/16/2006  [id number]   The patient returns to the office today for continued surveillance of a  carotid disease.  She is on aspirin 81 mg daily.  Denies any neurologic  symptoms.  No sensory, motor, or visual deficit.   Surveillance carotid duplex exam reveals progression of a right internal  carotid artery stenosis.  Velocities there now 323/129 cm per second  with severe calcified plaque in the right carotid bulb.   Blood pressure is 186/82, pulse 77 per minute, respirations 18 per  minute.  Right carotid bruit audible.  Cranial nerves intact.  Strength  equal bilaterally.  Gait intact.   The patient will require right carotid endarterectomy for reduction of  stroke risk.  Surgery is scheduled for December 01, 2006 at [facility].  She will increase her aspirin to 325 mg daily.  Details of  the operative procedure have been reviewed with the major  morbidity/mortality 1 to 2% to include but not limited to MI, CVA,  cranial nerve injury, and death.   Dorothea Glassman, M.D.  Electronically Signed   PGH/MEDQ  D:  10/16/2006  T:  10/17/2006  Job:  [id number]   cc:   [name]. [name], MD, [place] Plotnikov, MD

## 2010-06-05 NOTE — Procedures (Signed)
CAROTID DUPLEX EXAM   INDICATION:  Followup evaluation of known carotid artery disease   HISTORY:  Diabetes:  No  Cardiac:  Arrhythmia  Hypertension:  Yes  Smoking:  No  Previous Surgery:  No  CV History:  Patient reports no cerebrovascular symptoms at this time  Amaurosis Fugax No, Paresthesias No, Hemiparesis No                                       RIGHT             LEFT  Brachial systolic pressure:         160               158  Brachial Doppler waveforms:         Triphasic         Triphasic  Vertebral direction of flow:        Antegrade         Antegrade  DUPLEX VELOCITIES (cm/sec)  CCA peak systolic                   88                96  ECA peak systolic                   80                [id number]  ICA peak systolic                   323               0000000  ICA end diastolic                   129               67  PLAQUE MORPHOLOGY:                  Calcified         Calcified  PLAQUE AMOUNT:                      Severe.           Moderate.  PLAQUE LOCATION:                    Distal CCA, proximal ICA            Proximal ICA, ECA.   IMPRESSION:  1. Left ECA stenosis  2. An 80-99% right ICA stenosis.  3. A 60-79% left ICA stenosis.   Velocities have increased since the previous study performed on March 13, 2006.   ___________________________________________  P. Drucie Opitz, M.D.   MC/MEDQ  D:  10/16/2006  T:  10/17/2006  Job:  [id number]

## 2010-06-05 NOTE — Procedures (Signed)
CAROTID DUPLEX EXAM   INDICATION:  Followup of known carotid artery disease.  Patient is  asymptomatic.   HISTORY:  Diabetes:  No.  Cardiac:  Arrhythmia.  Hypertension:  Yes.  Smoking:  No.  Previous Surgery:  Right CEA with DPA on 12/01/2006 by Dr. Amedeo Plenty.  CV History:  Amaurosis Fugax No, Paresthesias No, Hemiparesis No                                       RIGHT             LEFT  Brachial systolic pressure:         150               148  Brachial Doppler waveforms:         WNL               WNL  Vertebral direction of flow:        Antegrade         Antegrade  DUPLEX VELOCITIES (cm/sec)  CCA peak systolic                   89                80  ECA peak systolic                   103               [id number]  ICA peak systolic                   85                [id number]  ICA end diastolic                   29                42  PLAQUE MORPHOLOGY:                  N/A               Calcific with  shadowing  PLAQUE AMOUNT:                      N/A               Moderate  PLAQUE LOCATION:                    N/A               ICA, ECA   IMPRESSION:  1. Right ICA without recurrent stenosis status post CEA.  2. Left 40 to 59% ICA stenosis; however, acoustic shadowing may have      obscured higher velocities.  3. Left ECA stenosis.  4. Bilateral antegrade flow in vertebral arteries.   ___________________________________________  P. Drucie Opitz, M.D.   PB/MEDQ  D:  06/18/2007  T:  06/18/2007  Job:  [id number]

## 2010-06-05 NOTE — Assessment & Plan Note (Signed)
OFFICE VISIT   [name] [name], [name] [name]  DOB:  03-Jun-1931                                       12/31/2007  [id number]   The patient underwent right carotid endarterectomy in November of 2008.  She returns at this time for continued surveillance.  Her right carotid  endarterectomy site is widely patent.  She has a moderate 40-59% left  ICA stenosis, mild shadowing.  No progression of disease evident.  Antegrade vertebral flow bilaterally.   Denies neurologic symptoms.  No sensory, motor or visual deficit.   She is on aspirin 81 mg daily.   Examination reveals a generally well-appearing 77 year old female.  Alert and oriented.  BP is 169/76, pulse 73 per minute.  No carotid  bruits.  Cranial nerves intact.  Strength equal bilaterally.  Normal  gait.   The patient has a widely patent right carotid endarterectomy site and  stable moderate asymptomatic left internal carotid artery stenosis.  We  will plan followup again in 1 year with carotid Doppler evaluation.   Dorothea Glassman, M.D.  Electronically Signed   PGH/MEDQ  D:  12/31/2007  T:  01/01/2008  Job:  [id number]   cc:   [name]. Plotnikov, MD  [name]. [name], MD, [facility]

## 2010-06-05 NOTE — Assessment & Plan Note (Signed)
[facility]                            CARDIOLOGY OFFICE NOTE   NAME:[name] [name], [name] [name]                       MRN:          [id number]  DATE:05/18/2007                            DOB:          [birth year]    Kaavya returns today for follow-up.  I have seen her in the past for  palpitations, atypical chest pain, hypertension, hypercholesterolemia.   Lamariah wanted to talk today.  She had multiple noncardiac issues.  First  of all she fell recently and had to go to the ER.  It sounds like she  misplaced her foot while going down in the basement.  She had  significant bruises on this.   She has had occasional episodes of lightheadedness.  I looked through a  myriad of blood pressure records from her, and on a very rare occasion  her systolic pressures seem to fall below 100.  I do not think there is  dysautonomia or postural hypotension.  I am not sure if she is having  vagal reactions.  In general she needs to be on Hyzaar since her blood  pressure tends to be more on the elevated side.  I tried to reassure her  about this, and she does not appear to be having TIA's or CVA's.  She  had multiple complaints that were orthopedic in nature, and she has seen  Dr. Adriana Mccallum.  She has a cyst on her right knee and what sounds like  shin splints.   She has had one steroid injection.  Her stomach is sensitive, and she  tends to take Tylenol mostly for pain.   The patient also apparently has some sort of anemia.  She was seen by  Dr. Earlean Shawl who referred her to Dr. Ralene Ok.  She does have a history of  pancreatitis which is not active.  Sounds like she may have anemia of  chronic disease, but she is waiting on some bone marrow results.   Again it was difficult to direct Akili's conversation.  She wanted to  talk about all of her other medical problems which took some time.   REVIEW OF SYSTEMS:  Otherwise, negative.  In particular she has not had  dyspnea or  chest pain.   CURRENT MEDICATIONS:  Are listed in the chart, annotated, and reviewed.  In particular she is on Hyzaar 100/12.5, multivitamins, and a baby  aspirin.  She also takes Lipitor 80 a day.   PHYSICAL EXAMINATION:  VITAL SIGNS:  Remarkable for blood pressure  130/70, pulse 70 and regular, afebrile, respiratory rate 14.  HEENT:  Unremarkable.  Carotids normal without bruit, no  lymphadenopathy, thyromegaly, JVP elevation.  LUNGS:  Clear, good diaphragmatic motion.  No wheezing.  HEART:  S1, S2, normal heart sounds, PMI normal.  She does have a faint  systolic murmur.  ABDOMEN:  Benign.  Bowel sounds positive, no AAA, no tenderness, no  hepatosplenomegaly, no jugular reflux, no bruits.  Distal pulses are  intact with some varicosities, a small bursal cyst over the right knee.  NEUROLOGICAL:  Nonfocal.  SKIN:  Warm and dry.  No muscular weakness.   IMPRESSION:  1. Hypertension currently well-controlled.  Continue Hyzaar.  2. Very infrequent episodes of low blood pressure likely vagal      reaction.  Doubt transient ischemic attack.  Continue to follow      symptomatically.  3. Anemia.  Follow with Dr. Ralene Ok.  Question chronic disease.  Bone      marrow results pending.  4. Hyperlipidemia.  Continue Lipitor, lipid and liver profile in six      months.  5. Orthopedic problems with shin splints and right knee problems.      Follow up with Dr. Adriana Mccallum.  Continue Tylenol.  Can consider      Celebrex in the future since she does not have critical coronary      disease and had nonischemic Myoview October 2008.   I tried to reassure [place] about her heart and risk factors today.  I  will see her back in six months.     [name]. [name], MD, [facility]  Electronically Signed    PCN/MedQ  DD: 05/18/2007  DT: 05/18/2007  Job #: [id number]

## 2010-06-05 NOTE — H&P (Signed)
[name], [name] NO.:  [id number]   MEDICAL RECORD NO.:  [id number]          PATIENT TYPE:  INP   LOCATION:  2899                         FACILITY:  [facility]   PHYSICIAN:  [name] [name], M.D.    DATE OF BIRTH:  1931-01-31   DATE OF ADMISSION:  12/01/2006  DATE OF DISCHARGE:                              HISTORY & PHYSICAL   CARDIOLOGIST:  [name] [name], M.D.   PRIMARY CARE PHYSICIAN:  [name] [name], M.D.   ADMISSION DIAGNOSIS:  Severe progressive right internal carotid artery  stenosis.   HISTORY:  [name] [name] is a 77 year old female with known carotid artery  occlusive disease.  This has been followed regularly by myself and Dr.  Johnsie [name].  Most recent carotid Doppler revealed progression of disease.  Velocities now 323/129 cm/sec with severe calcified plaque in the right  internal carotid artery.   [name] [name] has been free of any symptoms.  Denies sensory, motor or visual  deficit.  No speech problems.  No gait abnormality.  No syncope or  presyncope.   PAST MEDICAL HISTORY:  1. Hypertension.  2. Hyperlipidemia.  3. Palpitations.   MEDICATIONS:  1. Multivitamin 1 tablet daily.  2. Aspirin 325 mg daily.  3. Hyzaar 100/12.5 daily.  4. Lipitor 80 mg daily.  5. Tylenol ES 500 mg p.r.n.  6. Aciphex 20 mg every night.  7. Xopenex HFA 45 mcg 2 puffs q.4h. p.r.n.  8. Stool softener 1 tablet 3 times weekly.   ALLERGIES:  CODEINE, DARVON AND SULFUR ALL CAUSE NAUSEA AND VOMITING.  MELONS CAUSE NAUSEA.   REVIEW OF SYSTEMS:  The patient notes left hip pain for approximately  six months.  No chest pain.  No shortness of breath.  No abdominal  discomfort.   PHYSICAL EXAMINATION:  Reveals an alert, oriented, 77 year old female.  She appears her stated age.  No distress.  VITAL SIGNS:  BP 120/70, pulse 74 per minute and irregular, respirations  are 18 per minute.  HEENT:  Mouth and throat are clear.  Normocephalic.  NECK:  Supple. No thyromegaly or  adenopathy.  CHEST:  Clear.  Air entry equal bilaterally.  No wheezing or crackles.  CARDIOVASCULAR:  Normal heart sounds without murmurs.  Regular rate and  rhythm.  Right carotid bruit.  ABDOMEN:  Soft and nontender.  Moderate obesity.  No organomegaly.  No  masses felt.  Normal bowel sounds without bruits.  LOWER EXTREMITIES:  Femoral pulses are 2+ bilaterally.  No femoral  bruits.  NEUROLOGIC:  Cranial nerves are intact.  Strength equal bilaterally.  Reflexes 1+.  Normal sensation.  SKIN:  Warm and dry and intact.  MUSCULOSKELETAL:  No joint deformity.   IMPRESSION:  1. Asymptomatic severe progressive right internal carotid artery      stenosis.  2. Hypertension.  3. Hyperlipidemia.  4. Palpitations.   ADMISSION PLAN:  The patient will be admitted electively to [facility] for right carotid endarterectomy.  Risks of the operative  procedure have been explained in detail with major morbidity of 1-2% to  include but not to limited to MI,  CVA, cranial nerve injury and death.      [name] [name], M.D.  Electronically Signed     PGH/MEDQ  D:  12/01/2006  T:  12/01/2006  Job:  [id number]   cc:   [name] [name]. [name] Cancel, MD, [name] Plotnikov, MD

## 2010-06-05 NOTE — Assessment & Plan Note (Signed)
[facility]                            CARDIOLOGY OFFICE NOTE   NAME:[name], [name] CARUTHERS                       MRN:          [id number]  DATE:11/02/2007                            DOB:          1931/04/13    [name] [name] returns today for followup.  She has had a history of distant  PAF, hypertension.  She has anxiety and depression.   She talked to me at length today about a myriad of medical problems.  She has had some fatigue.  I noticed that she was anemic.  I referred  her to Dr. Earlean Shawl.  She subsequently was referred to Dr. Mable Fill and she  appears to have anemia of chronic disease.  She describes having a bone  marrow and has been getting Aranesp shots.  I do not have any further  details about this.  She seems to feel less fatigued.  With this she has  not had any recurrent palpitations, chest pain.  She has mild exertional  dyspnea, which is chronic.   She was concerned about a switch in her blood pressure medicines.  I  reviewed the EMR records, she appears to have been switched from  enalapril and hydrochlorothiazide to Benicar.  I reassured her that this  change was fine and that the Benicar was good blood pressure medicine.   She has some chronic reflux, which is stable.   Again, [name] [name] wanted to talk at length regarding her heart issues.  In  regards to vascular disease, she has had a right carotid endarterectomy.  This was done by Dr. Amedeo Plenty.  She is due to have a repeat carotid duplex  in November.  Since they have all been done at Dr. Amedeo Plenty' office, I told  her she should follow up there.  She has not had any recurrent TIAs.   Review of systems is otherwise remarkable for chronic back problems.  She has had some injections, this is limited quite a bit particularly a  few months ago when she was in a chair for 10 days.   Her current medications include Aciphex 20 a day, Xopenex p.r.n.,  docusate, fluoxetine 10 a day, lorazepam 1 a day, an  aspirin a day,  Aranesp, and Benicar 20 a day.   PHYSICAL EXAMINATION:  GENERAL:  Remarkable for mostly somewhat labile  female.  VITAL SIGNS:  Her weight is 87, blood pressure 130/80, pulse 77 and  regular, respiratory rate 14, afebrile.  HEENT:  Unremarkable.  NECK:  Carotids normal without bruit status post right CEA.  No  lymphadenopathy, thyromegaly, or JVP elevation.  LUNGS:  Clear, good diaphragmatic motion.  No wheezing.  CARDIAC:  S1 and S2.  Normal heart sounds.  PMI normal.  ABDOMEN:  Benign.  Bowel sounds positive.  No AAA.  No bruit.  No  hepatosplenomegaly.  No hepatojugular reflux.  EXTREMITIES:  No tenderness.  Distal pulses are intact.  No edema.  NEURO:  Nonfocal.  SKIN:  Warm and dry.  MUSCULOSKELETAL:  No muscular weakness.   Her electrocardiogram shows sinus rhythm with PACs.   Lab work was reviewed.  Her most recent hematocrit is 33.7.   There is no pancytopenia.   IMPRESSION:  1. History of right carotid endarterectomy.  No recurrences.  Continue      baby aspirin.  Follow up with Dr. Amedeo Plenty for duplex.  2. History of paroxysmal atrial fibrillation, distant PAC on EKG with      no evidence of recurrence.  No need for Coumadin.  3. Hypertension, currently well controlled.  Continue Benicar, low-      sodium diet.  4. Anxiety and depression.  Continue current dose of fluoxetine.      Follow up with Dr. Alain Marion.  5. Anemia of chronic disease.  Continue Aranesp shots.  Try to      reassure the patient in regards to not having cancer.  6. History of reflux.  Continue Aciphex 20 a day.  7. Seasonal allergies.  Continue p.r.n. Xopenex.   Overall, I think [name] [name]'s heart is stable, and I will see her back in 6  months.     [name]. [name], MD, [facility]  Electronically Signed    PCN/MedQ  DD: 11/02/2007  DT: 11/03/2007  Job #: [id number]

## 2010-06-05 NOTE — Assessment & Plan Note (Signed)
[facility]                            CARDIOLOGY OFFICE NOTE   NAME:[name], [name] PORTEN                       MRN:          [id number]  DATE:04/27/2008                            DOB:          [birth year]    [name] [name] is seen today in followup.  Unfortunately she showed up with an  event monitor on and I did not have the records.  She was also  hospitalized from April 11, 2008, to April 12, 2008, which I did not  know about.  I reviewed the hospital discharge summary and records  provided through Dr. Gwendolyn Grant.   The patient had a recent birthday.  She had been up until 2 in the  morning.  She had been working at [employer].  She was tired.  She  did have some alcohol.  She describes what sounds like a vagal reaction  that occurred early in the morning.  She felt a flicker in her chest and  then got hot and warm.  She says she woke up and had blood on her  forehead and lips.  Reading the discharge summary, they suspected a  vagal reaction in the setting of severe nausea and exhaustion.  She does  not have known coronary artery disease.  She has had good LV function.  She does have peripheral vascular disease.  Her last carotid duplex  study done at Dr. Amedeo Plenty' office was also reviewed.  This was done on  December 31, 2007, and showed a patent right ICA site from previous  endarterectomy and a 40-59% left ICA stenosis.   The patient is felt well since discharge.  She has had an event monitor  in place and has sent in recordings, but I do not have these results.  There to be reviewed later.  She has not had any rapid palpitations,  chest pain, PND, orthopnea, and has felt in general okay since  discharge.  She does complain of chronic fatigue over the past year.  I  suspect part of this is depression.   She was not discharged on any different medications.   Further workup in the hospital was negative.   CURRENT MEDICATIONS:  1. Multivitamins.  2. Aspirin a day.  3. Hyzaar 100/25.  4. Lipitor 80 a day.  5. Aciphex 20 a day.  6. Tylox.  7. Xopenex.  8. Dulcolax.   PHYSICAL EXAMINATION:  GENERAL:  A talkative female in no distress.  VITAL SIGNS:  Her blood pressure 130/80, which is not postural, pulse 70  and regular, respiratory rate 14, afebrile.  HEENT:  Unremarkable.  NECK:  Bilateral carotid bruits with a previous right CEA scar.  No JVP  elevation, lymphadenopathy, or thyromegaly.  LUNGS:  Clear with good diaphragmatic motion.  No wheezing.  CARDIAC:  S1 and S2, normal heart sounds, PMI normal.  ABDOMEN:  Benign.  Bowel sounds positive.  No AAA, no tenderness, no  bruit.  No hepatosplenomegaly or hepatojugular reflux.  EXTREMITIES:  Distal pulses intact.  No edema.  NEUROLOGIC:  Affect somewhat depressed.  Nonfocal.  SKIN:  Warm and  dry.  MUSCULOSKELETAL:  No muscular weakness.   Carotid duplex reviewed.  Hospital discharge summary reviewed.   IMPRESSION:  1. Vasovagal syncope, nonrecurrent related to alcohol exhaustion and      nausea.  I do not think there is a particular structural heart      problem.  We will review event monitor strips when available.  2. Carotid disease.  Follow up with Dr. Amedeo Plenty.  No transient ischemic      attacks.  Continue aspirin therapy.  Follow up carotid duplex in      December 2010.  3. Hypertension, currently well controlled.  Continue current dose of      ARB with low-dose diuretic.  4. Hyperlipidemia.  Continue Lipitor.  Lipid and liver profile in 6      months.  LFTs were normal in the hospital.  5. History of reflux.  Continue Aciphex, currently under good control.   Considerable time was spent listening to [place] story today.  She is  quite talkative and also having to go back over her chart and review her  hospital records.     [name]. [name], MD, [facility]  Electronically Signed    PCN/MedQ  DD: 04/27/2008  DT: 04/28/2008  Job #: [id number]

## 2010-06-05 NOTE — Op Note (Signed)
NAMEKRISTALYNN, BITNER                ACCOUNT NO.:  [id number]   MEDICAL RECORD NO.:  [id number]          PATIENT TYPE:  INP   LOCATION:  2550                         FACILITY:  [facility]   PHYSICIAN:  Dorothea Glassman, M.D.    DATE OF BIRTH:  Jan 10, 1932   DATE OF PROCEDURE:  12/01/2006  DATE OF DISCHARGE:                               OPERATIVE REPORT   PREOPERATIVE DIAGNOSIS:  Severe progressive right internal carotid  artery stenosis.   POSTOPERATIVE DIAGNOSIS:  Severe progressive right internal carotid  artery stenosis.   PROCEDURE:  Right carotid endarterectomy, Dacron patch angioplasty.   SURGEON:  Dorothea Glassman, MD   ASSISTANT:  Luther Bradley, PA.   ANESTHETIC:  General endotracheal.   ANESTHESIOLOGIST:  Crissie Sickles. Conrad [place], MD   CLINICAL NOTE:  [name] [name] is a 77 year old female followed regularly  at [facility] and by Dr. [name] with known carotid artery occlusive disease.  Recent Doppler revealed this to have progressed significantly to a  severe stenosis.  She has previously undergone an arteriogram  delineating the anatomy.  Brought to the operating at this time for  right carotid endarterectomy for reduction of stroke risk.  Potential  operative risks explained to the patient in detail with a major  morbidity and mortality of 1-2% to include but not limited MI, CVA,  cranial nerve injury and death.   OPERATIVE PROCEDURE:  The patient brought to the operating room in  stable hemodynamic condition.  Foley catheter and arterial line in  place.  General anesthesia induced.  Right neck prepped and draped in a  sterile fashion.   Curvilinear skin incision made along the upper border of the right  sternomastoid muscle.  Dissection carried down through the subcutaneous  tissue with electrocautery.  Platysma divided.  Deep dissection carried  down to expose the carotid bifurcation.  The bridging veins were ligated  with 3-0 silk and divided.  The carotid bifurcation exposed.  There  was  external rotation of the bifurcation.  The superior thyroid artery was  mobilized and reflected medially.  This allowed further exposure of the  bifurcation.  The common carotid artery mobilized down to the omohyoid  muscle and encircled with a vessel loop.  The superior thyroid and  external carotid were freed and encircled with vessel loops.  The distal  internal carotid artery followed up to the posterior belly of the  digastric muscle.  The hypoglossal nerve reflected superiorly and  preserved.  The distal internal carotid artery encircled with a vessel  loop.  The vagus nerve reflected posteriorly and preserved.   The the patient administered 7000 units of heparin intravenously.  Adequate circulation time permitted.  The carotid vessels controlled  with clamps.  A longitudinal arteriotomy made in the distal common  carotid artery.  The arteriotomy extended across the carotid bulb and up  into the internal carotid artery.  There was a large plaque in the  carotid bulb extending into the origin of the right internal carotid  artery.  This was an exophytic plaque.  The internal carotid artery was  severely stenosed due  to plaque.  Distally the plaque feathered out well  and disappeared.  The distal internal carotid artery appeared normal in  caliber.  A shunt was inserted.   Using an endarterectomy elevator, the plaque was removed.  The  endarterectomy carried down into the common carotid artery, where the  plaque was divided transversely with Potts scissors.  The superior  thyroid and external carotid were endarterectomized using an eversion  technique.  The distal internal carotid artery plaque feathered out  well.  Fragments of plaque were removed with fine forceps.  The site  irrigated with heparin and saline solution.   A patch angioplasty of the endarterectomy site was then carried out with  a Finesse Dacron patch.  Running 6-0 Prolene suture used for the  anastomosis.   Once the patch angioplasty was completed, the shunt was  removed.  All vessels well-flushed.  Clamps were removed directing  initial antegrade flow up the external carotid artery.  Following this,  the internal carotid was released.   Adequate hemostasis obtained.  Sponge and instrument counts correct.  Excellent Doppler signal and pulse in the distal internal carotid artery  with return of flow.   The patient administered 50 mg protamine intravenously.   The sternomastoid fascia closed with a running 2-0 Vicryl suture.  Platysma closed with running 3-0 Vicryl suture.  Skin closed with 4-0  Monocryl.  Skin closed with Dermabond.   Anesthesia reversed in the operating room.  The patient awakened  readily.  Moved all extremities to command.  Transferred to the recovery  room in stable condition.      Dorothea Glassman, M.D.  Electronically Signed     PGH/MEDQ  D:  12/01/2006  T:  12/02/2006  Job:  [id number]   cc:   [name]. [name], MD, [place] Plotnikov, MD

## 2010-06-05 NOTE — Assessment & Plan Note (Signed)
OFFICE VISIT   [name] [name], [name] [name]  DOB:  1931-11-19                                       12/11/2006  [id number]   First postoperative visit following right carotid endarterectomy carried  out 12/01/2006 at [facility].  Uneventful operative procedure.  The patient did stay one extra day in the hospital due to some  unsteadiness of gait, nausea and lightheadedness.   Doing well at this time.  No major complaints.  Mild right neck pain.  Noted mild right neck swallowing.  Swallowing good.   PHYSICAL EXAMINATION:  BP is 154/80, pulse 90 per minute; regular,  respirations 18.  Right neck incision healing unremarkably.  Cranial  nerves are intact.   The patient doing well following surgery.   PLAN:  Follow up in six months with carotid Doppler.   Dorothea Glassman, M.D.  Electronically Signed   PGH/MEDQ  D:  12/11/2006  T:  12/11/2006  Job:  [id number]   cc:   [name]. [name], MD, [place] Plotnikov, MD

## 2010-06-05 NOTE — Discharge Summary (Signed)
[name] [name], [name] [name] NO.:  [id number]   MEDICAL RECORD NO.:  [id number]          PATIENT TYPE:  INP   LOCATION:  [id number]                         FACILITY:  [facility]   PHYSICIAN:  Ricard Dillon, MD    DATE OF BIRTH:  [birth year]   DATE OF ADMISSION:  09/08/2008  DATE OF DISCHARGE:  09/11/2008                               DISCHARGE SUMMARY   DISCHARGING PHYSICIAN:  Ricard Dillon, M.D.   PRIMARY CARE PHYSICIAN:  [name]. Plotnikov, M.D.   CARDIOLOGIST:  [name]. [name], M.D.   GASTROENTEROLOGIST:  [name], M.D.   ADMISSION DIAGNOSES:  1. Diverticular bleed.  2. Chronic renal insufficiency.  3. Chronic anemia.  4. Asthma.   HOSPITAL COURSE:  The patient is a 77 year old female with a past  medical history significant for diverticulitis, status post colon  resection for this with a history of hypertension, peripheral vascular  disease and asthma.  She had had a cardiac endarterectomy in the recent  past.  She presented with bright red blood per rectum and was admitted  with a lower GI bleed that was felt to be diverticular in nature.  She  was initially admitted to the step-down unit and monitored, transfused  two units of packed red blood cells, and a gastroenterology consult was  obtained on August 19 by Dr. Earlean Shawl.  He said that he would follow up in  2-3 weeks after discharge for colonoscopy and signed off at that time.  She continued to be followed for the next two days for stability.  Her  hemoglobin initially after transfusion was 9.9, fluctuated from 9.8-10  and was at the time of discharge 10.3.   She was considered stable after 48 hours of stable blood readings/  Her  blood pressure was stable.  Her vital signs were stable.  Her asthma was  stable.  She was not short of breath but she was profoundly weak from  her bleed.   She was given dietary instructions in the hospital on a low residue  diet.  She was given further instructions on  high-fiber once low residue  diet was completed.   DISCHARGE/PLAN:  Includes the following:  1. She will see Dr. Cristie Hem Plotnikov in one week for CBC monitoring.  2. She will continue a low residue diet for a minimum of two weeks.  3. She will meet with Dr. Earlie Raveling in 1-2 weeks at his office to      discuss colonoscopy.   She was considered stable at that time for discharge.   DISCHARGE DIAGNOSES:  1. Diverticulosis.  2. Diverticular bleed.  3. Anemia.  4. Asthma.      Ricard Dillon, MD  Electronically Signed     JEJ/MEDQ  D:  09/11/2008  T:  09/12/2008  Job:  [id number]

## 2010-06-05 NOTE — H&P (Signed)
[name] [name], [name] [name] NO.:  [id number]   MEDICAL RECORD NO.:  [id number]          PATIENT TYPE:  EMS   LOCATION:  ED                           FACILITY[facility]  [facility]   PHYSICIAN:  Farris Has, MDDATE OF BIRTH:  10/06/1931   DATE OF ADMISSION:  09/08/2008  DATE OF DISCHARGE:                              HISTORY & PHYSICAL   PRIMARY CARE Mehkai Gallo:  Dr. Alain Marion.   CARDIOLOGIST:  Dr. [name].   The patient is a 77 year old female with past medical history  significant for diverticulitis, status post bur resection for this, also  a history of hypertension and peripheral vascular disease as well as  asthma.  The patient was at her baseline of health up until this  afternoon when she ate dinner with her daughter.  Shortly thereafter,  she developed bright red blood per rectum.  She stated that there was a  fair amount of blood in the toilet bowl.  She has had frequent stools  since then with a fair amount of blood in the toilet bowl each time.  Here in ED, she was noted to have dark maroon blood in the rectal vault.  So far, her bowel movements have slowed down and she has not had one  since 4 a.m.  When she tried to sit up she was feeling very lightheaded.  The patient called EMS and was brought into the emergency department.  Hemoglobin was noted to be 11.1.  [facility] was called for  admission.  No chest pain, no shortness of breath, no chills, no fevers,  no abdominal pain, no nausea, no vomiting, no hematemesis, no melena  prior to this.  Otherwise, the patient was completely at her baseline  before this started.   REVIEW OF SYSTEMS:  Otherwise unremarkable.   PAST MEDICAL HISTORY:  1. Vasovagal syncope.  2. Hypertension.  3. Peripheral vascular disease, status post right carotid enterectomy.  4. Dyslipidemia.  5. GERD.  6. Status post partial colectomy for diverticulitis.  7. Hypertension.  8. History of pancreatitis.  9. Chronic anemia for  which she gets Aranesp shots and on iron.  10.Chronic low back pain.  11.Asthma.  12.Anemia, followed by Dr. Ralene Ok.   SOCIAL HISTORY:  The patient does not smoke or drink, does not abuse  drugs.  She lives alone at home.   FAMILY HISTORY:  Noncontributory.   ALLERGIES:  1. CODEINE.  2. DARVON.  3. SULFA.   MEDICATIONS:  1. Aciphex 20 mg daily.  2. Xopenex 2 puffs as needed.  3. Colace 100 mg every evening.  4. Ativan 1 mg twice a day.  5. Aspirin 81 mg four times a day.  6. Aranesp shots as needed for anemia.  7. Iron twice a day.  8. Benicar 20/12.5 daily.   PHYSICAL EXAMINATION:  VITAL SIGNS:  Temperature not obtained.  Blood  pressure 142/81 initially, now is down to 101/58 and up to 113 recently,  pulse 78, respirations 18.  Saturating 97% on room air.  GENERAL:  The patient appears to be in no acute distress.  HEENT:  Head  nontraumatic.  Pale mucosa and conjunctivae.  LUNGS:  Clear to auscultation bilaterally.  HEART:  Regular rate and rhythm.  No murmurs appreciated.  ABDOMEN:  Obese.  There is a ventral hernia noted.  No epigastric  tenderness.  No tenderness otherwise.  LOWER EXTREMITIES:  Without clubbing, cyanosis or edema.  NEUROLOGIC:  Grossly intact.  SKIN:  Clean, dry and intact.   LABORATORY DATA:  White blood cell count 9.1, hemoglobin 11.1.  Sodium  133, potassium 3.9, creatinine 1.62, INR is 1.0.   ASSESSMENT/PLAN:  This is a 77 year old female with past medical history  significant for diverticulitis in the past, status post partial  colectomy with history of chronic anemia here with bright red blood per  rectum.   1. Bright red blood per rectum, most likely diverticular bleed.  Upper      source less likely, but the patient has been taking aspirin.  We      will admit to step-down given orthostasis.  Give IV fluids, give      Protonix.  The patient will most likely benefit from colonoscopy in      a.m., call GI.  For orthostatics, follow  frequent CBCs.  We will      hold aspirin and hold blood pressure medications.  2. Elevated creatinine per review of records.  In June, she also had      an elevated creatinine.  We will follow and see how it changes with      IV fluids.  3. History of anemia.  Obtain anemia panel.  Transfuse for hemoglobin      below 8.  4. Asthma, currently stable.  Give Xopenex p.r.n.  5. History of hypertension.  Hold blood pressure medications.  Given      slight hyponatremia would in the future discontinue      hydrochlorothiazide if possible.  6. Prophylaxis.  Protonix plus sequential compression devices.      Farris Has, MD  Electronically Signed     AVD/MEDQ  D:  09/08/2008  T:  09/08/2008  Job:  [id number]   cc:   [name]. Plotnikov, MD  [address] [address]   Peter C. [name], [place], [address] [place]  [place] [address]

## 2010-06-05 NOTE — Discharge Summary (Signed)
[name] [name], [name] [name] NO.:  [id number]   MEDICAL RECORD NO.:  [id number]          PATIENT TYPE:  INP   LOCATION:  2022                         FACILITY:  [facility]   PHYSICIAN:  Dorothea Glassman, M.D.    DATE OF BIRTH:  10/08/1931   DATE OF ADMISSION:  12/01/2006  DATE OF DISCHARGE:                               DISCHARGE SUMMARY   ANTICIPATED DATE OF DISCHARGE:  December 03, 2006.   ADMISSION DIAGNOSIS:  Severe right internal carotid artery stenosis,  asymptomatic.   DISCHARGE/SECONDARY DIAGNOSES:  1. Severe right internal carotid artery stenosis, asymptomatic, status      post right carotid endarterectomy.  2. Moderate left internal carotid artery stenosis.  3. Hypertension.  4. Hyperlipidemia.  5. Asthma.  6. History of diverticular surgery.  7. History of palpitations.  8. History of chronic back pain.  9. Mild postoperative acute blood loss anemia.  10.Mild postoperative hyperglycemia with no history of diabetes      mellitus (in normal preoperative blood glucose).   ALLERGIES:  1. CODEINE.  2. DARVON.  3. SULFA.   PROCEDURE:  On November 01, 2006, right carotid endarterectomy with  Dacron patch angioplasty by Dr. Gordy Clement.   BRIEF HISTORY:  [name] [name] is a 77 year old Caucasian female followed  regularly at [facility] and by Dr. Jenkins Rouge for non-carotid artery  occlusive disease.  Recent carotid Dopplers revealed significant  progression in the right to severe stenosis.  Velocities were now  323/129 cm/sec with severe calcified plaque in the right internal  carotid artery.  She also has known moderate left carotid artery  stenosis.  She has been asymptomatic of her carotid artery disease.  Based on the progression of her right carotid artery stenosis, Dr. Amedeo Plenty  recommended that she undergo selective right carotid endarterectomy.  Of  note, she did have a arteriogram preoperatively and also underwent an  adenosine myocardial perfusion test showing  no sign of scar or ischemia.   HOSPITAL COURSE:  [name] [name] was electively admitted to [facility] on December 01, 2006.  She underwent a right carotid  endarterectomy and was extubated and neurologically intact.  After a  short stay in the recovery unit, she was transferred to the step-down  unit 3300 where she remained until the next morning.  On postoperative  day 1, she remained hemodynamically stable, but sluggish.  She  complained of a headache, as well as chronic back pain and, therefore,  was resistant to mobility.  Headache was treated with Tylenol with some  improvement.  She also reported some difficulty swallowing, but was  related more to throat and sinus congestion postoperatively.  She also  had some nausea and vomiting the night of surgery, but this had improved  by morning of postoperative day 1.  Neurologically, she remained intact.  Her tongue was midline.  She was moving all extremities.  Her incision  appeared to be healing well without signs of hematoma.  Lung sounds were  cleared, but diminished at bases and she was maintaining sinus rhythm.  Her abdominal exam was benign.  Because of her pain and congestion  issues, it was felt she should remain hospitalized for an additional  day.  She was transferred unit to 2000.  Orders were written for her to  be ambulating in the hallways with assist.  Mucinex was added as needed  for her congestion and she was encouraged to turn, cough and deep  breath.  Incentive spirometry was also added.  Of note, postoperative  labs did indicate some mild postoperative acute blood loss anemia with  hemoglobin and hematocrit 8.8 and 25.8 respectively.  Her white count  8.5, platelet count 347.  Sodium was 132, potassium 3.7, BUN 12,  creatinine 1.22 and blood glucose 125.  Of note, she also had 1  postoperative capillary blood glucose, nonfasting, of 161 with a  followup blood glucose of 125.  Of note, her preoperative glucose is  99  and there is no documented history of diabetes mellitus.  It was  anticipated [name] [name] makes good prognosis with her mobility and pain  management that she will be discharged home on postoperative day 2,  December 03, 2006.   DISCHARGE MEDICATIONS:  1. Ultram 50 mg 1 tablet p.o. q.6 hours p.r.n. for pain.  2. Aspirin 81 mg 2 tablets p.o. daily.  3. Hyzaar 100/12.5 mg p.o. daily.  4. Lipitor 80 mg p.o. q.h.s.  5. Tylenol EF 500 mg q.6 hours p.r.n..  6. Aciphex 20 mg p.o. q.h.s.  7. Xopenex HFA 45 mcg 2 puffs q.4 hours p.r.n. wheezing.  8. Stool softener 3 times weekly.  9. Multivitamin with Lutin and lycopene daily.   DISCHARGE INSTRUCTIONS:  She should continue a heart healthy diet.  Increase her activity slowly.  Avoid driving or heavy lifting for the  next 2 weeks.  She may shower and clean her incision gently with soap  and water.  She should call if she develops redness or drainage from her  incision site or fever greater than 101 or changes in neurological  status.  She will see Dr. Amedeo Plenty at the vascular vein specialist office  in approximately 2 weeks, our office will contact her regarding specific  appointment date and time.      Jacinta Shoe, P.A.      Dorothea Glassman, M.D.  Electronically Signed    AWZ/MEDQ  D:  12/02/2006  T:  12/02/2006  Job:  [id number]   cc:   [name]. [name], MD, [facility], MD

## 2010-06-05 NOTE — Assessment & Plan Note (Signed)
[facility]                            CARDIOLOGY OFFICE NOTE   NAME:Reierson, LETORIA TERRANOVA                       MRN:          [id number]  DATE:03/25/2007                            DOB:          1931-10-09    This is a patient of Dr. [name].  Ms. Vonbergen is a since pleasant white  female patient of Dr. Kyla Balzarine who has a history of hypertension,  palpitations and hypercholesterolemia.  She recently called in with some  dizziness and blood pressures that were running low, and Dr. Dorris Carnes  had ordered blood work and a followup with Dr. [name].  Our office was  closed on Monday because of the snow, and she missed her appointment  with Dr. [name], and is here today.  She has several recordings.  Most  of her blood pressures run in the 130 to 140/76 range.  There are a few  occasions were it dropped to 99 or 84 systolic over 60.  She has a brief  period where she becomes dizzy and she can sit down, and it goes away  very quickly.  She has not had any syncope with this.  It is not related  to getting up from a sitting or lying down position.  She is concerned  because she does live alone.  Her heart rates have been stable as well.  She had one where she her heart rate that up to 100 when she was  walking, but most heart rates were in the 80s.  She has been limited in  exercise because of chronic back problems and pain medications, and quit  exercising about 9 months ago.   CURRENT MEDICATIONS:  1. Multivitamin daily.  2. Aspirin 81 mg 2 b.i.d.  3. Lipitor 80 mg daily.  4. Albuterol p.r.n.  5. Hyzaar 100/12.5 mg daily.  6. AcipHex every other day.  7. Colace 1 every other day p.r.n.   PHYSICAL EXAM:  This is an anxious white female in no acute distress.  We did orthostatics in the office.  Please see these for details.  Was  not orthostatic.  Blood pressure 132/71, pulse 73.  Neck is without JVD, HJR bruit.  Thyroid enlargement.  LUNGS:  Clear anterior  posterior lateral.  HEART:  Regular rate and rhythm at 70 beats per minute.  Normal S1-S2 no  murmur, rub, bruit, thrill or heave noted.  ABDOMEN:  Soft without organomegaly, mass lesions or abnormal  tenderness.  EXTREMITIES:  Without cyanosis or edema.  She has good distal pulses.   RECENT LABS:  White count was elevated at 11.7.  Hemoglobin is low and  11.3, hematocrit 34, platelet 544,000, BUN 35, creatinine 1.4 and TSH  was normal and 2.48.   IMPRESSION:  1. Hypertension.  2. Dizziness with occasional episodes of hypotension, question      etiology.  3. Anemia, new for her.  4. History of palpitations.  5. Hypercholesterolemia.  6. Recent right carotid endarterectomy.  7. History of gastroenteritis reflux with peptic ulcer disease      followed by Dr. Earlean Shawl.   PLAN:  At this time, I have asked the patient to follow up with Dr.  Earlean Shawl concerning her anemia.  She thinks she had a colonoscopy within  the past year but cannot remember for sure.  At this point I am afraid  to lower her Hyzaar because most her blood pressures are in the [id number]  systolic range and she is not orthostatic.  We will have her follow up  with Dr. [name] within a month.      Ermalinda Barrios, PA-C  Electronically Signed      Loretha Brasil. Lia Foyer, MD, [facility]  Electronically Signed   ML/MedQ  DD: 03/25/2007  DT: 03/25/2007  Job #: [id number]

## 2010-06-05 NOTE — Discharge Summary (Signed)
[name] [name], [name] [name] NO.:  [id number]   MEDICAL RECORD NO.:  [id number]          PATIENT TYPE:  INP   LOCATION:  41                         FACILITY:  [facility]   PHYSICIAN:  Valerie A. Asa Lente, MDDATE OF BIRTH:  Jun 21, 1931   DATE OF ADMISSION:  04/11/2008  DATE OF DISCHARGE:  04/12/2008                               DISCHARGE SUMMARY   DISCHARGE DIAGNOSES:  1. Syncope, suspect vagal in the setting of severe nausea and      exhaustion, negative cardiac and neuro evaluation here, see details      below.  2. Hypertension.  3. Peripheral vascular disease status post right carotid      endarterectomy in November 2008, continue medical management.  4. Dyslipidemia.  5. History of gastroesophageal reflux disease.  6. Chronic low back pain.  7. History of asthma, no acute exacerbation.   DISCHARGE MEDICATIONS:  As prior to admission and include:  1. Multivitamin once daily.  2. Aspirin 81 mg tablets 2 in the a.m. plus 1 p.o. nightly.  3. Hyzaar 100/12.5 once daily.  4. Lipitor 80 mg daily.  5. Aciphex 20 mg nightly.  6. Tylenol 500 mg p.r.n. arthritis pain.  7. Xopenex 1 inhalation MDI q.4 h. p.r.n. shortness of breath.  8. Dulcolax 100 mg every other day to prevent constipation.   DISPOSITION:  The patient is discharged home in medically stable and  improved condition.  She is asymptomatic.  We will, however, arrange for  pick up at an event monitor through [facility], to be arranged  in the next 48 hours for further evaluation of the patient's syncopal  event with history of fluttering symptoms in the past week.  The patient  will be contacted by [facility] in the next 24 hours regarding  pick up of this event monitor for further evaluation and followup with  her cardiologist, Dr. [name], as needed following these results.  In the  meanwhile, followup with her primary care physician, Dr. Lew Dawes, has been arranged for 2 weeks,  Tuesday, April 26, 2008, at  9:30 a.m. for further evaluation of this and other chronic medical  issues.   HOSPITAL COURSE BY PROBLEM:  1. Syncope:  The patient is a pleasant 77 year old woman with multiple      chronic and stable medical issues as listed above who presented to      her primary care physician on day of admission following a fall the      day prior.  She reports that she had had a large celebration over      the weekend prior due to her 77th birthday on April 09, 2006.  She      had had little to eat and felt weak, and slightly dehydrated.  That      day on route to the sink, she was feeling nauseated and says she      does not remember anything else except waking up on the floor with      warm blood drizzling down from her nose.  She called her neighbor  but went to bed and did not notify her family or anyone of the fall      and trauma until the next day when she awoke and noticed how      bruised and swollen she was, including a bruise around her left      eye.  She subsequently was brought to her primary care physician      who admitted her for evaluation of syncope.  She was placed on      telemetry, and serial cardiac enzymes were cycled, each of these      tests were negative for arrhythmia or cardiac ischemia,      respectively.  Hemodynamics have been stable.  She has had no      hypoxia, and she had a normal CBC and CMET during this      hospitalization.  The patient has had no further syncopal or      presyncopal dizziness and fluttering symptoms but does report that      in the last several weeks though not associated with syncopal      events, she does occasionally feel fluttering in her chest that      concerns her, invariably associated with lightheadedness, though      she has been stable on tele at this time.  Because of the symptoms,      I have arranged for an outpatient event monitor to be through      [facility] for further evaluation of  these palpitations in      the setting of syncope.  However, at this time, having no other      inpatient acute issues for further evaluation, she is felt stable      for discharge home.  She will be watched by physical therapy and/or      the nursing staff prior to discharge and assuming she is      asymptomatic without shortness of breath, fluttering, dizziness,      etc, she will be discharged home as described.  Her other medical      issues are chronic and as listed.  No changes in her medical      regimen were made during this hospitalization.  Please see chart      for full details.   Greater than 30 spent on day of discharge for coordination with patient  evaluation, discussion with daughter, outpatient planning for followup.      Valerie A. Asa Lente, MD  Electronically Signed     VAL/MEDQ  D:  04/12/2008  T:  04/13/2008  Job:  [id number]

## 2010-06-07 ENCOUNTER — Telehealth: Payer: Self-pay | Admitting: Cardiovascular Disease

## 2010-06-07 NOTE — Telephone Encounter (Signed)
Pt calling re 20 mg lipitor samples

## 2010-06-07 NOTE — Telephone Encounter (Signed)
Left message for pt that lipitor is now generic and we do not have samples [name] [name]

## 2010-06-08 ENCOUNTER — Telehealth: Payer: Self-pay | Admitting: Cardiovascular Disease

## 2010-06-08 DIAGNOSIS — E785 Hyperlipidemia, unspecified: Secondary | ICD-10-CM

## 2010-06-08 MED ORDER — ATORVASTATIN CALCIUM 20 MG PO TABS: 20.0000 mg | ORAL_TABLET | Freq: Every day | ORAL | Status: DC

## 2010-06-08 NOTE — Telephone Encounter (Signed)
LEFT MESSAGE ON MACHINE  LIPITOR CALLED INTO [facility] .Adonis Housekeeper

## 2010-06-08 NOTE — Consult Note (Signed)
NAMELORELAI, MERTA NO.:  [id number]   MEDICAL RECORD NO.:  [id number]          PATIENT TYPE:  AMB   LOCATION:  SDS                          FACILITY:  [facility]   PHYSICIAN:  Genia Del, M.D.     DATE OF BIRTH:  July 18, 1931   DATE OF CONSULTATION:  DATE OF DISCHARGE:                                   CONSULTATION   DATE OF CONSULTATION:  March 01, 2005.   REASON FOR CONSULTATION:  Requested by Dr. Amedeo Plenty for interpretation of  intracranial views obtained during carotid arteriogram, March 01, 2005.   INTERPRETATION OF STUDIES:  Right common carotid arteriogram, intracranial  views AP and lateral only:  Mild intracranial atherosclerotic-type changes  without large vessel stenosis.  No cross filling.   Left common carotid arteriogram, intracranial views AP and lateral only:  Mild intracranial atherosclerotic-type changes without large vessel  occlusion.  Minimal flash filling, right anterior cerebral artery  circulation.   IMPRESSION:  Mild intracranial atherosclerotic-type changes as noted above.           ______________________________  Genia Del, M.D.     SO/MEDQ  D:  03/01/2005  T:  03/02/2005  Job:  [id number]   cc:   Standley Brooking  Radiology Department  (this needs to be checked off with her  prior to going to patient's chart)

## 2010-06-08 NOTE — Op Note (Signed)
[name], [name]                ACCOUNT NO.:  [id number]   MEDICAL RECORD NO.:  [id number]          PATIENT TYPE:  AMB   LOCATION:  SDS                          FACILITY:  [facility]   PHYSICIAN:  Dorothea Glassman, M.D.    DATE OF BIRTH:  Feb 20, 1931   DATE OF PROCEDURE:  03/01/2005  DATE OF DISCHARGE:                                 OPERATIVE REPORT   DIAGNOSIS:  Extracranial cerebrovascular occlusive disease.   PROCEDURES:  1.  Arch aortogram.  2.  Bilateral selective carotid arteriograms.   ACCESS:  Right common femoral artery, 5-French sheath.   CONTRAST:  125 mL Visipaque.   COMPLICATIONS:  None apparent.   CLINICAL NOTE:  [name] [name] is a 77 year old female followed in the office  with serial Doppler evaluation for carotid artery occlusive disease. Her  Doppler evaluation has revealed a stenosis in the left internal carotid  artery which is estimated to be moderate to severe. She has previously  undergone a CT angiogram which has estimated the left internal carotid  artery stenosis to be more severe in degree. Due to the discrepancy between  the two tests, she is brought to cath lab at this time for diagnostic  arteriography.   PROCEDURE NOTE:  The patient was brought to the catheterization lab in  stable condition. Placed in the supine position. Informed consent obtained.  Right groin prepped and draped in a sterile fashion.   Skin and subcutaneous tissues were instilled with 1% Xylocaine. A needle was  easily induced in right common femoral artery. A 0.035 Wholey guidewire was  advanced through the needle into the midabdominal aorta. The needle was  removed. A 5-French sheath was advanced over the guidewire, the dilator  removed, and the sheath flushed with heparin saline solution.   A standard pigtail catheter was then advanced over the guidewire to the  descending aorta. A 30-degree LAO projection arch aortogram was obtained.  The throat revealed a bovine arch. The  innominate artery origin was widely  patent. The left common carotid artery arose just beyond the origin of the  innominate artery. The right subclavian and the left subclavian vessels were  widely patent. Large vertebral arteries were present bilaterally, with  antegrade flow. The proximal common carotid arteries were widely patent  bilaterally.   The guidewire was then reinserted, and an H1 catheter advanced over the  guidewire. The H1 catheter engaged in the innominate artery, the guidewire  advanced up into the proximal right common carotid artery. The catheter  advanced into the right common carotid artery. Cervical right carotid  arteriography obtained. This revealed a patent right common carotid artery.  The right carotid bifurcation revealed calcified plaque. The right internal  carotid artery revealed approximately a 60% stenosis. Right external carotid  artery was widely patent.   The H1 catheter was then exchanged over guidewire for a Simmons 1 catheter.  Simmons 1 catheter was engaged into the left common carotid artery. Left  carotid arteriography obtained. This revealed tortuosity of the proximal  left internal carotid artery. There was calcified plaque present at the left  carotid bifurcation, with a posterior ulcer. The degree of stenosis of left  internal carotid artery was estimated to be 50%.   The Simmons 1 catheter then was withdrawn from the left common carotid  artery and removed over a guidewire. The right femoral sheath was removed.  There were no apparent complications.   Intracranial arteriography was obtained, and this will be dictated under a  separate heading by neuroradiology.   FINAL IMPRESSION:  1.  Moderate bilateral internal carotid artery stenosis. Right internal      carotid artery estimated to be 60-70% stenotic. Left internal carotid      artery estimated to be 50% stenotic.  2.  Bovine aortic arch.   PLANNED FOLLOWUP:  To return the office  in approximately 6 months. Continue  antiplatelet agents with aspirin. Follow up Doppler evaluation upon return  the office.      Dorothea Glassman, M.D.  Electronically Signed     PGH/MEDQ  D:  03/01/2005  T:  03/01/2005  Job:  [id number]   cc:   Jenkins Rouge, M.D.  [address] [place]  [place] [address]

## 2010-06-08 NOTE — Assessment & Plan Note (Signed)
[facility]                              CARDIOLOGY OFFICE NOTE   NAME:[name] [name]                       MRN:          [id number]  DATE:08/23/2005                            DOB:          01-Aug-1931   HISTORY:  [name] [name] returns today for followup.  She is age 77.  She is in good  health.   She has moderate to severe bilateral carotid disease.  She had a carotid  angiogram with circle of Willis runoff by Dr. Dorothea Glassman, I believe in  February.  He felt that she does not have surgical disease yet and she has  an appointment for followup with him.  I told her that since he is following  her closely and did her carotid angiogram that she should probably have her  Dopplers done at [facility] for maintenance of consistency in followup.   From a cardiac perspective she is stable.  She has no documented coronary  artery disease.  Her blood pressure is a bit high.  She has been on Cardizem  and hydrochlorothiazide.  I think we will switch her to Hyzaar 100/12.5 mg,  to see if we can get her blood pressure under better control.  Her  cholesterol was excellent.  She is on Lipitor.  Her liver function tests are  normal.  Her LDL was 66 and her HDL is over 50.   PHYSICAL EXAMINATION:  GENERAL:  She looks well.  VITAL SIGNS:  Her blood pressure is 160/80, pulse 70 and regular.  LUNGS:  Clear.  NECK:  I do not hear actual carotid bruits.  HEART:  There are no abnormal heart tones.  ABDOMEN:  Benign.  No renal bruits.  EXTREMITIES:  Distal pulses intact.  No edema.   Her electrocardiogram today is normal.   IMPRESSION:  1.  Stable hypertension.  2.  Stable hypercholesterolemia.   PLAN:  Note changes above for blood pressure.  Will follow up BMET in one  month.  I will see her back in three months.  She will see Dr. Amedeo Plenty  shortly.  He will follow her carotid disease with  duplex and be the final judge of timing for surgery.  She has not had any  significant  TIA's or CVA-like symptoms.  She will continue with one aspirin  a day.                               [name]. [name], MD, [facility]   PCN/MedQ  DD:  08/23/2005  DT:  08/23/2005  Job #:  [id number]   cc:   Dorothea Glassman, MD

## 2010-06-08 NOTE — Telephone Encounter (Signed)
Pt needs Rx called in for lipitor 20mg  qd at [facility] on [address]

## 2010-06-26 ENCOUNTER — Encounter (HOSPITAL_BASED_OUTPATIENT_CLINIC_OR_DEPARTMENT_OTHER): Payer: Medicare Other | Admitting: Oncology

## 2010-06-26 ENCOUNTER — Other Ambulatory Visit (HOSPITAL_COMMUNITY): Payer: Self-pay | Admitting: Oncology

## 2010-06-26 DIAGNOSIS — D649 Anemia, unspecified: Secondary | ICD-10-CM

## 2010-06-26 LAB — CBC WITH DIFFERENTIAL/PLATELET
Eosinophils Absolute: 0.2 10*3/uL (ref 0.0–0.5)
MONO#: 0.5 10*3/uL (ref 0.1–0.9)
NEUT#: 4.3 10*3/uL (ref 1.5–6.5)
Platelets: 394 10*3/uL (ref 145–400)
RBC: 3.72 10*6/uL (ref 3.70–5.45)
RDW: 13.9 % (ref 11.2–14.5)
WBC: 6.4 10*3/uL (ref 3.9–10.3)

## 2010-07-24 ENCOUNTER — Other Ambulatory Visit (HOSPITAL_COMMUNITY): Payer: Self-pay | Admitting: Oncology

## 2010-07-24 ENCOUNTER — Encounter (HOSPITAL_BASED_OUTPATIENT_CLINIC_OR_DEPARTMENT_OTHER): Payer: Medicare Other | Admitting: Oncology

## 2010-07-24 DIAGNOSIS — D638 Anemia in other chronic diseases classified elsewhere: Secondary | ICD-10-CM

## 2010-07-24 DIAGNOSIS — N289 Disorder of kidney and ureter, unspecified: Secondary | ICD-10-CM

## 2010-07-24 DIAGNOSIS — D649 Anemia, unspecified: Secondary | ICD-10-CM

## 2010-07-24 LAB — CBC WITH DIFFERENTIAL/PLATELET
Eosinophils Absolute: 0.2 10*3/uL (ref 0.0–0.5)
HCT: 30.9 % — ABNORMAL LOW (ref 34.8–46.6)
LYMPH%: 29.5 % (ref 14.0–49.7)
MONO#: 0.7 10*3/uL (ref 0.1–0.9)
NEUT#: 3.8 10*3/uL (ref 1.5–6.5)
NEUT%: 57 % (ref 38.4–76.8)
Platelets: 356 10*3/uL (ref 145–400)
WBC: 6.7 10*3/uL (ref 3.9–10.3)

## 2010-08-02 ENCOUNTER — Telehealth: Payer: Self-pay | Admitting: Cardiovascular Disease

## 2010-08-02 DIAGNOSIS — E785 Hyperlipidemia, unspecified: Secondary | ICD-10-CM

## 2010-08-02 MED ORDER — ATORVASTATIN CALCIUM 20 MG PO TABS: 20.0000 mg | ORAL_TABLET | Freq: Every day | ORAL | Status: DC

## 2010-08-02 NOTE — Telephone Encounter (Signed)
Spoke with pt, generic lipitor is costing her $170.00, she wants to try crestor so we can help her some with samples. Samples of crestor 5mg  placed at the front desk for pick up. She also requested a written script for lipitor so she can shop around [name]

## 2010-08-02 NOTE — Telephone Encounter (Signed)
Pt has questions regarding lipitor, she will be at home until 11:30 and then she will be back at 4pm today

## 2010-08-14 ENCOUNTER — Other Ambulatory Visit: Payer: Self-pay | Admitting: Internal Medicine

## 2010-08-21 ENCOUNTER — Other Ambulatory Visit (HOSPITAL_COMMUNITY): Payer: Self-pay | Admitting: Oncology

## 2010-08-21 ENCOUNTER — Encounter (HOSPITAL_BASED_OUTPATIENT_CLINIC_OR_DEPARTMENT_OTHER): Payer: Medicare Other | Admitting: Oncology

## 2010-08-21 DIAGNOSIS — D649 Anemia, unspecified: Secondary | ICD-10-CM

## 2010-08-21 DIAGNOSIS — N289 Disorder of kidney and ureter, unspecified: Secondary | ICD-10-CM

## 2010-08-21 LAB — CBC WITH DIFFERENTIAL/PLATELET
BASO%: 0.5 % (ref 0.0–2.0)
Basophils Absolute: 0 10*3/uL (ref 0.0–0.1)
HCT: 37 % (ref 34.8–46.6)
LYMPH%: 28.7 % (ref 14.0–49.7)
MCH: 33 pg (ref 25.1–34.0)
MCHC: 34.5 g/dL (ref 31.5–36.0)
MONO#: 0.4 10*3/uL (ref 0.1–0.9)
NEUT%: 59.7 % (ref 38.4–76.8)
Platelets: 385 10*3/uL (ref 145–400)
WBC: 5.4 10*3/uL (ref 3.9–10.3)

## 2010-08-24 LAB — FERRITIN: Ferritin: 137 ng/mL (ref 10–291)

## 2010-08-24 LAB — COMPREHENSIVE METABOLIC PANEL
ALT: 16 U/L (ref 0–35)
Albumin: 4 g/dL (ref 3.5–5.2)
Alkaline Phosphatase: 82 U/L (ref 39–117)
Potassium: 3.9 mEq/L (ref 3.5–5.3)
Sodium: 132 mEq/L — ABNORMAL LOW (ref 135–145)
Total Bilirubin: 0.6 mg/dL (ref 0.3–1.2)
Total Protein: 6.8 g/dL (ref 6.0–8.3)

## 2010-08-24 LAB — TRANSFERRIN RECEPTOR, SOLUABLE: Transferrin Receptor, Soluble: 1.53 mg/L (ref 0.76–1.76)

## 2010-09-18 ENCOUNTER — Other Ambulatory Visit (HOSPITAL_COMMUNITY): Payer: Self-pay | Admitting: Oncology

## 2010-09-18 ENCOUNTER — Encounter (HOSPITAL_BASED_OUTPATIENT_CLINIC_OR_DEPARTMENT_OTHER): Payer: Medicare Other | Admitting: Oncology

## 2010-09-18 DIAGNOSIS — D649 Anemia, unspecified: Secondary | ICD-10-CM

## 2010-09-18 LAB — CBC WITH DIFFERENTIAL/PLATELET
Basophils Absolute: 0 10*3/uL (ref 0.0–0.1)
EOS%: 3.8 % (ref 0.0–7.0)
HCT: 34.7 % — ABNORMAL LOW (ref 34.8–46.6)
HGB: 12 g/dL (ref 11.6–15.9)
LYMPH%: 27.6 % (ref 14.0–49.7)
MCH: 32.5 pg (ref 25.1–34.0)
MCV: 94 fL (ref 79.5–101.0)
MONO%: 9.4 % (ref 0.0–14.0)
NEUT%: 58.7 % (ref 38.4–76.8)
Platelets: 381 10*3/uL (ref 145–400)

## 2010-09-25 ENCOUNTER — Telehealth: Payer: Self-pay | Admitting: Cardiovascular Disease

## 2010-09-25 NOTE — Telephone Encounter (Signed)
Pt wants to discuss her medication per pt you would know what it was about

## 2010-09-25 NOTE — Telephone Encounter (Signed)
Spoke with pt, samples of crestor 5 mg placed at the front desk for pick up [name]

## 2010-10-16 ENCOUNTER — Other Ambulatory Visit (HOSPITAL_COMMUNITY): Payer: Self-pay | Admitting: Oncology

## 2010-10-16 ENCOUNTER — Encounter (HOSPITAL_BASED_OUTPATIENT_CLINIC_OR_DEPARTMENT_OTHER): Payer: Medicare Other | Admitting: Oncology

## 2010-10-16 DIAGNOSIS — D649 Anemia, unspecified: Secondary | ICD-10-CM

## 2010-10-16 DIAGNOSIS — N289 Disorder of kidney and ureter, unspecified: Secondary | ICD-10-CM

## 2010-10-16 LAB — DIFFERENTIAL
Basophils Relative: 1
Monocytes Relative: 9
Neutro Abs: 4.7
Neutrophils Relative %: 66

## 2010-10-16 LAB — CBC WITH DIFFERENTIAL/PLATELET
Basophils Absolute: 0 10*3/uL (ref 0.0–0.1)
EOS%: 3.2 % (ref 0.0–7.0)
LYMPH%: 31 % (ref 14.0–49.7)
MCH: 32.3 pg (ref 25.1–34.0)
MCV: 92.7 fL (ref 79.5–101.0)
MONO%: 10.6 % (ref 0.0–14.0)
RBC: 3.38 10*6/uL — ABNORMAL LOW (ref 3.70–5.45)
RDW: 13.1 % (ref 11.2–14.5)

## 2010-10-16 LAB — CHROMOSOME ANALYSIS, BONE MARROW

## 2010-10-16 LAB — CBC
MCHC: 34.4
Platelets: 473 — ABNORMAL HIGH
RBC: 3.3 — ABNORMAL LOW
WBC: 7.2

## 2010-10-16 LAB — BONE MARROW EXAM

## 2010-10-22 ENCOUNTER — Encounter (INDEPENDENT_AMBULATORY_CARE_PROVIDER_SITE_OTHER): Payer: Medicare Other | Admitting: Cardiology

## 2010-10-22 ENCOUNTER — Encounter: Payer: Self-pay | Admitting: Cardiovascular Disease

## 2010-10-22 ENCOUNTER — Ambulatory Visit (INDEPENDENT_AMBULATORY_CARE_PROVIDER_SITE_OTHER): Payer: Medicare Other | Admitting: Cardiovascular Disease

## 2010-10-22 VITALS — BP 145/75 | HR 74 | Resp 16 | Ht 63.0 in | Wt 184.8 lb

## 2010-10-22 DIAGNOSIS — G4700 Insomnia, unspecified: Secondary | ICD-10-CM

## 2010-10-22 DIAGNOSIS — R002 Palpitations: Secondary | ICD-10-CM

## 2010-10-22 DIAGNOSIS — I10 Essential (primary) hypertension: Secondary | ICD-10-CM

## 2010-10-22 DIAGNOSIS — E785 Hyperlipidemia, unspecified: Secondary | ICD-10-CM

## 2010-10-22 DIAGNOSIS — I6529 Occlusion and stenosis of unspecified carotid artery: Secondary | ICD-10-CM

## 2010-10-22 NOTE — Assessment & Plan Note (Signed)
Wants more Xanax.  Indicated it is a controlled substance and addictive.  F/U [facility].  Consider benedryl and melatonin

## 2010-10-22 NOTE — Progress Notes (Signed)
[name] [name] returns today for F/U of HTN, palpitations and fatigue. She was hospitalized recently for a diverticular bleed. This seems stable and she has F/U with Dr. Earlean Shawl. She has had palpitations. I reviewed her recenet event monitor and it was benighn with no significant arrythmias. She has been compliant with her BP meds. She has had a previous RCEA by Dr. Amedeo Plenty. She has residua40-59% bilateral stenosis by duplex 10/11 She will have a F/U duplex in10/12 She has had no TIA like symptoms and is off asa now due to her diverticular bleed. She denies SSCP, palpitations, dyspnea, PND orthopnea and edema  She is on a study at [facility] for BP  And gets it followed every 3 months   ROS: Denies fever, malais, weight loss, blurry vision, decreased visual acuity, cough, sputum, SOB, hemoptysis, pleuritic pain, palpitaitons, heartburn, abdominal pain, melena, lower extremity edema, claudication, or rash.  All other systems reviewed and negative  General: Affect appropriate Healthy:  appears stated age 77: normal Neck supple with no adenopathy JVP normal  Previous RCEA   No bruits no thyromegaly Lungs clear with no wheezing and good diaphragmatic motion Heart:  S1/S2 no murmur,rub, gallop or click PMI normal Abdomen: benighn, BS positve, no tenderness, no AAA no bruit.  No HSM or HJR Distal pulses intact with no bruits No edema Neuro non-focal Skin warm and dry No muscular weakness   Current Outpatient Prescriptions  Medication Sig Dispense Refill  . albuterol (PROAIR HFA) 108 (90 BASE) MCG/ACT inhaler Inhale 2 puffs into the lungs every 4 (four) hours as needed.        . calcium carbonate (TUMS EX) 750 MG chewable tablet Chew 2 tablets by mouth daily.        . chlorthalidone (HYGROTON) 25 MG tablet Take 25 mg by mouth daily.        . Cholecalciferol (EQL VITAMIN D3) 1000 UNITS tablet Take 1,000 Units by mouth daily.        . darbepoetin (ARANESP, ALB FREE, SURECLICK) 123XX123 [id number] SOLN Inject 100  mcg into the skin every 30 (thirty) days.       [name] LORazepam (ATIVAN) 1 MG tablet Take 1 tablet (1 mg total) by mouth at bedtime.  30 tablet  3  . Omega-3 Fatty Acids (FISH OIL) 1200 MG CAPS Take 2 capsules by mouth daily.        . RABEprazole (ACIPHEX) 20 MG tablet Take 20 mg by mouth as needed.        . valsartan (DIOVAN) 80 MG tablet Take 160 mg by mouth daily.       . vitamin B-12 (CYANOCOBALAMIN) 1000 MCG tablet Take 1,000 mcg by mouth daily.        [name] aspirin 81 MG tablet Take 81 mg by mouth daily.          Allergies  Codeine sulfate; Propoxyphene napsylate; and Sulfatol c  Electrocardiogram:  NSR 71 nonspecific flattening of ST in lateral leads.  Artifact  Assessment and Plan

## 2010-10-22 NOTE — Assessment & Plan Note (Signed)
Cholesterol is at goal.  Continue current dose of statin and diet Rx.  No myalgias or side effects.  F/U  LFT's in 6 months. Lab Results  Component Value Date   LDLCALC 72 06/01/2010

## 2010-10-22 NOTE — Assessment & Plan Note (Signed)
Well controlled.  Continue current medications and low sodium Dash type diet.    

## 2010-10-22 NOTE — Patient Instructions (Addendum)
  Your physician has requested that you have a carotid duplex. This test is an ultrasound of the carotid arteries in your neck. It looks at blood flow through these arteries that supply the brain with blood. Allow one hour for this exam. There are no restrictions or special instructions.  Your physician recommends that you schedule a follow-up appointment in: 1 year with Dr. [name]

## 2010-10-22 NOTE — Assessment & Plan Note (Signed)
F/U duplex today. No TIA;s Continue ASA

## 2010-10-23 ENCOUNTER — Telehealth: Payer: Self-pay | Admitting: *Deleted

## 2010-10-23 ENCOUNTER — Telehealth: Payer: Self-pay | Admitting: Cardiovascular Disease

## 2010-10-23 NOTE — Telephone Encounter (Signed)
Rf Req for Lorazepam 1 mg 1 po qhs. # 30. Last filled 8.30.12. Ok to Rf?

## 2010-10-23 NOTE — Telephone Encounter (Signed)
Pt called She saw Dr [name] on Monday She had questions about crestor. Should she stay on crestor?

## 2010-10-23 NOTE — Telephone Encounter (Signed)
Spoke with pt, samples of crestor 5 mg placed at the front desk for pick up [name]

## 2010-10-24 MED ORDER — LORAZEPAM 1 MG PO TABS: 1.0000 mg | ORAL_TABLET | Freq: Every day | ORAL | Status: DC

## 2010-10-24 NOTE — Telephone Encounter (Signed)
Called in.

## 2010-10-24 NOTE — Telephone Encounter (Signed)
OK to fill this prescription with additional refills x3 Thank you!  

## 2010-10-25 NOTE — Progress Notes (Signed)
Addended by: Levora Angel L on: 10/25/2010 03:30 PM   Modules accepted: Orders

## 2010-10-30 ENCOUNTER — Other Ambulatory Visit: Payer: Self-pay | Admitting: Internal Medicine

## 2010-10-30 DIAGNOSIS — Z1231 Encounter for screening mammogram for malignant neoplasm of breast: Secondary | ICD-10-CM

## 2010-10-30 LAB — COMPREHENSIVE METABOLIC PANEL
ALT: 32
BUN: 22
CO2: 30
Calcium: 9.6
Creatinine, Ser: 1.28 — ABNORMAL HIGH
GFR calc non Af Amer: 41 — ABNORMAL LOW
Glucose, Bld: 99

## 2010-10-30 LAB — PROTIME-INR
INR: 1
Prothrombin Time: 12.9

## 2010-10-30 LAB — URINALYSIS, ROUTINE W REFLEX MICROSCOPIC
Glucose, UA: NEGATIVE
Ketones, ur: NEGATIVE
Protein, ur: NEGATIVE
Urobilinogen, UA: 0.2

## 2010-10-30 LAB — CBC
Hemoglobin: 11.1 — ABNORMAL LOW
MCHC: 34
MCHC: 34.1
MCV: 90.6
MCV: 91.2
Platelets: 347
RBC: 3.59 — ABNORMAL LOW
WBC: 8.5

## 2010-10-30 LAB — APTT: aPTT: 24

## 2010-10-30 LAB — BASIC METABOLIC PANEL
BUN: 12
Chloride: 103
Creatinine, Ser: 1.22 — ABNORMAL HIGH

## 2010-11-13 ENCOUNTER — Encounter (HOSPITAL_BASED_OUTPATIENT_CLINIC_OR_DEPARTMENT_OTHER): Payer: Medicare Other | Admitting: Oncology

## 2010-11-13 ENCOUNTER — Other Ambulatory Visit (HOSPITAL_COMMUNITY): Payer: Self-pay | Admitting: Oncology

## 2010-11-13 DIAGNOSIS — D649 Anemia, unspecified: Secondary | ICD-10-CM

## 2010-11-13 LAB — CBC WITH DIFFERENTIAL/PLATELET
BASO%: 0.3 % (ref 0.0–2.0)
HCT: 35.2 % (ref 34.8–46.6)
MCHC: 34 g/dL (ref 31.5–36.0)
MONO#: 0.6 10*3/uL (ref 0.1–0.9)
RBC: 3.69 10*6/uL — ABNORMAL LOW (ref 3.70–5.45)
WBC: 6.4 10*3/uL (ref 3.9–10.3)
lymph#: 1.6 10*3/uL (ref 0.9–3.3)

## 2010-11-19 ENCOUNTER — Ambulatory Visit
Admission: RE | Admit: 2010-11-19 | Discharge: 2010-11-19 | Disposition: A | Discharge status: Home / Self Care | Payer: Medicare Other | Source: Ambulatory Visit | Attending: Internal Medicine | Admitting: Internal Medicine

## 2010-11-19 DIAGNOSIS — Z1231 Encounter for screening mammogram for malignant neoplasm of breast: Secondary | ICD-10-CM

## 2010-12-08 ENCOUNTER — Other Ambulatory Visit: Payer: Self-pay | Admitting: Oncology

## 2010-12-08 DIAGNOSIS — N189 Chronic kidney disease, unspecified: Secondary | ICD-10-CM

## 2010-12-08 DIAGNOSIS — D631 Anemia in chronic kidney disease: Secondary | ICD-10-CM | POA: Insufficient documentation

## 2010-12-11 ENCOUNTER — Other Ambulatory Visit (HOSPITAL_COMMUNITY): Payer: Self-pay | Admitting: Oncology

## 2010-12-11 ENCOUNTER — Ambulatory Visit: Payer: Medicare Other

## 2010-12-11 ENCOUNTER — Other Ambulatory Visit (HOSPITAL_BASED_OUTPATIENT_CLINIC_OR_DEPARTMENT_OTHER): Payer: Medicare Other

## 2010-12-11 DIAGNOSIS — D649 Anemia, unspecified: Secondary | ICD-10-CM

## 2010-12-11 DIAGNOSIS — D631 Anemia in chronic kidney disease: Secondary | ICD-10-CM

## 2010-12-11 DIAGNOSIS — D638 Anemia in other chronic diseases classified elsewhere: Secondary | ICD-10-CM

## 2010-12-11 DIAGNOSIS — N289 Disorder of kidney and ureter, unspecified: Secondary | ICD-10-CM

## 2010-12-11 LAB — CBC WITH DIFFERENTIAL/PLATELET
Basophils Absolute: 0 10*3/uL (ref 0.0–0.1)
HCT: 33.4 % — ABNORMAL LOW (ref 34.8–46.6)
HGB: 11.4 g/dL — ABNORMAL LOW (ref 11.6–15.9)
MONO#: 0.4 10*3/uL (ref 0.1–0.9)
NEUT#: 3.5 10*3/uL (ref 1.5–6.5)
NEUT%: 60.6 % (ref 38.4–76.8)
WBC: 5.8 10*3/uL (ref 3.9–10.3)
lymph#: 1.6 10*3/uL (ref 0.9–3.3)

## 2010-12-11 MED ORDER — DARBEPOETIN ALFA-POLYSORBATE 500 MCG/ML IJ SOLN: 200.0000 ug | Freq: Once | INTRAMUSCULAR | Status: DC

## 2011-01-08 ENCOUNTER — Other Ambulatory Visit (HOSPITAL_COMMUNITY): Payer: Self-pay | Admitting: Oncology

## 2011-01-08 ENCOUNTER — Other Ambulatory Visit (HOSPITAL_BASED_OUTPATIENT_CLINIC_OR_DEPARTMENT_OTHER): Payer: Medicare Other | Admitting: Lab

## 2011-01-08 ENCOUNTER — Other Ambulatory Visit: Payer: Self-pay | Admitting: *Deleted

## 2011-01-08 ENCOUNTER — Ambulatory Visit: Payer: Medicare Other

## 2011-01-08 DIAGNOSIS — D631 Anemia in chronic kidney disease: Secondary | ICD-10-CM

## 2011-01-08 DIAGNOSIS — N289 Disorder of kidney and ureter, unspecified: Secondary | ICD-10-CM

## 2011-01-08 DIAGNOSIS — D649 Anemia, unspecified: Secondary | ICD-10-CM

## 2011-01-08 DIAGNOSIS — N189 Chronic kidney disease, unspecified: Secondary | ICD-10-CM

## 2011-01-08 LAB — CBC WITH DIFFERENTIAL/PLATELET
Basophils Absolute: 0 10*3/uL (ref 0.0–0.1)
EOS%: 4 % (ref 0.0–7.0)
Eosinophils Absolute: 0.2 10*3/uL (ref 0.0–0.5)
HCT: 32.3 % — ABNORMAL LOW (ref 34.8–46.6)
HGB: 11.1 g/dL — ABNORMAL LOW (ref 11.6–15.9)
MCH: 32.3 pg (ref 25.1–34.0)
MCV: 94 fL (ref 79.5–101.0)
NEUT%: 57.4 % (ref 38.4–76.8)
lymph#: 1.5 10*3/uL (ref 0.9–3.3)

## 2011-01-08 MED ORDER — DARBEPOETIN ALFA-POLYSORBATE 500 MCG/ML IJ SOLN: 200.0000 ug | Freq: Once | INTRAMUSCULAR | Status: DC

## 2011-01-08 NOTE — Progress Notes (Signed)
Aranesp held per Dr. Ralene Ok.  Hgb 11.1.  Patient notified

## 2011-02-04 DIAGNOSIS — M722 Plantar fascial fibromatosis: Secondary | ICD-10-CM | POA: Diagnosis not present

## 2011-02-05 ENCOUNTER — Encounter: Payer: Self-pay | Admitting: Oncology

## 2011-02-05 ENCOUNTER — Other Ambulatory Visit (HOSPITAL_COMMUNITY): Payer: Self-pay | Admitting: Oncology

## 2011-02-05 ENCOUNTER — Ambulatory Visit (HOSPITAL_BASED_OUTPATIENT_CLINIC_OR_DEPARTMENT_OTHER): Payer: Medicare Other | Admitting: Oncology

## 2011-02-05 ENCOUNTER — Other Ambulatory Visit (HOSPITAL_BASED_OUTPATIENT_CLINIC_OR_DEPARTMENT_OTHER): Payer: Medicare Other | Admitting: Lab

## 2011-02-05 ENCOUNTER — Telehealth: Payer: Self-pay | Admitting: Oncology

## 2011-02-05 VITALS — BP 149/67 | HR 77 | Temp 98.0°F | Ht 63.0 in | Wt 183.0 lb

## 2011-02-05 DIAGNOSIS — N289 Disorder of kidney and ureter, unspecified: Secondary | ICD-10-CM | POA: Diagnosis not present

## 2011-02-05 DIAGNOSIS — D649 Anemia, unspecified: Secondary | ICD-10-CM

## 2011-02-05 DIAGNOSIS — N039 Chronic nephritic syndrome with unspecified morphologic changes: Secondary | ICD-10-CM | POA: Diagnosis not present

## 2011-02-05 DIAGNOSIS — D631 Anemia in chronic kidney disease: Secondary | ICD-10-CM

## 2011-02-05 DIAGNOSIS — N189 Chronic kidney disease, unspecified: Secondary | ICD-10-CM

## 2011-02-05 LAB — COMPREHENSIVE METABOLIC PANEL
CO2: 28 mEq/L (ref 19–32)
Calcium: 9.5 mg/dL (ref 8.4–10.5)
Chloride: 97 mEq/L (ref 96–112)
Creatinine, Ser: 1.6 mg/dL — ABNORMAL HIGH (ref 0.50–1.10)
Glucose, Bld: 82 mg/dL (ref 70–99)
Total Bilirubin: 0.5 mg/dL (ref 0.3–1.2)
Total Protein: 6.9 g/dL (ref 6.0–8.3)

## 2011-02-05 LAB — CBC WITH DIFFERENTIAL/PLATELET
Basophils Absolute: 0 10*3/uL (ref 0.0–0.1)
EOS%: 3.4 % (ref 0.0–7.0)
HGB: 11.4 g/dL — ABNORMAL LOW (ref 11.6–15.9)
LYMPH%: 30.3 % (ref 14.0–49.7)
MCH: 31.3 pg (ref 25.1–34.0)
MCV: 93.2 fL (ref 79.5–101.0)
MONO%: 11.2 % (ref 0.0–14.0)
NEUT%: 54.7 % (ref 38.4–76.8)
Platelets: 401 10*3/uL — ABNORMAL HIGH (ref 145–400)
RDW: 12.8 % (ref 11.2–14.5)

## 2011-02-05 LAB — IRON AND TIBC
%SAT: 37 % (ref 20–55)
Iron: 127 ug/dL (ref 42–145)
UIBC: 212 ug/dL (ref 125–400)

## 2011-02-05 NOTE — Progress Notes (Signed)
CC:   [name]. Plotnikov, MD [name], M.D. [name]. [name], MD, [facility]  HISTORY:  I saw [name] [name] today for followup of her anemia felt to be secondary to renal insufficiency.  A bone marrow aspirate and biopsy carried out on May 14, 2007 was basically negative.  Mrs. Zimmers has been coming here every 4 weeks for CBC and Aranesp 200 mcg subcu whenever the hemoglobin is less than or equal to 11.  Her last doses of Aranesp when her hemoglobin was less than 11 were on 10/16/2010, 07/24/2010 and 05/29/2010.  Her lowest hemoglobin during that period was 10.6.  In looking over the patient's record, I do not see where she has had a hemoglobin less than 10.  The patient does complain of feelings of fatigue.  However, I do not think there is good correlation between her hemoglobin and those feelings.  She does have a history of depression. 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  The patient is fairly independent.  The patient does state that she has some feelings of being cold.  A TSH on 06/01/2010 was 2.86.  Mrs. Sek was last seen by [name] on 08/21/2010.  There have been no major changes in her condition over the past 6 months.  PROBLEM LIST: 1. Anemia felt to be secondary to renal insufficiency.  We have been     following the patient since February 2009 for this problem.  She     had bone marrow aspirate and biopsy on 05/14/2007 with negative     findings and negative cytogenetics. 2. Mild renal insufficiency. 3. Hypertension. 4. Dyslipidemia. 5. Peripheral vascular disease status post carotid endarterectomy on     the right side in November 2008. 6. History of atrial fibrillation. 7. GERD. 8. Osteoporosis. 9. Depression. 10.History of pancreatitis in 1992. 11.Irritable bowel  syndrome. 12.Diverticulosis status post sigmoid resection in September 1995. 13.Status post GI bleeding, felt to be due to diverticulosis.  MEDICATIONS:  Reviewed and recorded.  The patient is intolerant of oral iron.  She is on vitamin B12 1000 mcg daily.  PHYSICAL EXAM:  Mrs. Degirolamo looks well.  She is 78 years old.  Weight is 183 pounds which is stable, height 5 feet 3 inches, body surface area 1.92 m squared.  Blood pressure 149/67.  Other vital signs are normal. There is no scleral icterus.  Mouth and pharynx are benign.  She has a scar from her right carotid endarterectomy carried out in November 2008. No peripheral adenopathy palpable.  Heart/lungs:  Normal.  Abdomen: Obese, nontender with no organomegaly or masses palpable with the patient sitting.  Extremities:  No peripheral edema or clubbing. Neurologic:  Grossly normal.  LABORATORY DATA:  Today, white count 5.6, ANC 3.1, hemoglobin 11.4, hematocrit 33.8, platelets 401,000.  On 12/18 hemoglobin was 11.1, hematocrit 32.3.  On 11/20 hemoglobin was 11.4, hematocrit 33.4. Chemistries and iron studies today are pending.  On 08/21/2010 BUN was 35, creatinine 1.57 generally stable.  Sodium 132, albumin 4.0.  Liver function tests were normal.  On 07/31 iron saturation was 36%, ferritin 137.  IMAGING STUDIES:  Bilateral screening mammograms were carried out on 11/19/2010 and were negative.  IMPRESSION/PLAN:  Mrs. Peper seems to be doing quite well.  She is not requiring very much Aranesp.  We had been checking CBCs  every 4 weeks, and giving Aranesp 200 mcg subcu whenever the hemoglobin was less than or equal to 11.  I think we can stretch things out a bit such that the patient will have a CBC now every 8 weeks and Aranesp 200 mcg subcu as needed.  We will plan to see Mrs. Cothern again in 6 months at which time we will check CBC, chemistries, and iron studies.    ______________________________ Jeanie Cooks, M.D. DSM/MEDQ   D:  02/05/2011  T:  02/05/2011  Job:  [id number]

## 2011-02-05 NOTE — Telephone Encounter (Signed)
appts made and r/s for 3/12 5/7 7/2 and md visit on 7/2,printed for pt   aom

## 2011-02-05 NOTE — Progress Notes (Signed)
This office note has been dictated.  [id number]

## 2011-02-15 DIAGNOSIS — M722 Plantar fascial fibromatosis: Secondary | ICD-10-CM | POA: Diagnosis not present

## 2011-02-18 DIAGNOSIS — M722 Plantar fascial fibromatosis: Secondary | ICD-10-CM | POA: Diagnosis not present

## 2011-02-22 DIAGNOSIS — M722 Plantar fascial fibromatosis: Secondary | ICD-10-CM | POA: Diagnosis not present

## 2011-02-25 DIAGNOSIS — M722 Plantar fascial fibromatosis: Secondary | ICD-10-CM | POA: Diagnosis not present

## 2011-03-01 DIAGNOSIS — M722 Plantar fascial fibromatosis: Secondary | ICD-10-CM | POA: Diagnosis not present

## 2011-03-05 ENCOUNTER — Other Ambulatory Visit: Payer: Medicare Other | Admitting: Lab

## 2011-03-05 ENCOUNTER — Ambulatory Visit: Payer: Medicare Other

## 2011-03-06 DIAGNOSIS — M722 Plantar fascial fibromatosis: Secondary | ICD-10-CM | POA: Diagnosis not present

## 2011-03-19 ENCOUNTER — Telehealth: Payer: Self-pay | Admitting: Cardiovascular Disease

## 2011-03-19 NOTE — Telephone Encounter (Signed)
Samples left at the front desk.  Pt was notified.

## 2011-03-19 NOTE — Telephone Encounter (Signed)
New msg Pt wants samples of crestor 5 mg Please call and let her know

## 2011-04-02 ENCOUNTER — Ambulatory Visit (HOSPITAL_BASED_OUTPATIENT_CLINIC_OR_DEPARTMENT_OTHER): Payer: Medicare Other

## 2011-04-02 ENCOUNTER — Other Ambulatory Visit (HOSPITAL_BASED_OUTPATIENT_CLINIC_OR_DEPARTMENT_OTHER): Payer: Medicare Other | Admitting: Lab

## 2011-04-02 VITALS — BP 134/64 | HR 78 | Temp 97.9°F

## 2011-04-02 DIAGNOSIS — N039 Chronic nephritic syndrome with unspecified morphologic changes: Secondary | ICD-10-CM | POA: Diagnosis not present

## 2011-04-02 DIAGNOSIS — N189 Chronic kidney disease, unspecified: Secondary | ICD-10-CM

## 2011-04-02 DIAGNOSIS — D631 Anemia in chronic kidney disease: Secondary | ICD-10-CM

## 2011-04-02 DIAGNOSIS — D649 Anemia, unspecified: Secondary | ICD-10-CM

## 2011-04-02 LAB — CBC WITH DIFFERENTIAL/PLATELET
BASO%: 0.4 % (ref 0.0–2.0)
MCHC: 34.2 g/dL (ref 31.5–36.0)
MONO#: 0.5 10*3/uL (ref 0.1–0.9)
RBC: 3.2 10*6/uL — ABNORMAL LOW (ref 3.70–5.45)
WBC: 5.8 10*3/uL (ref 3.9–10.3)
lymph#: 1.6 10*3/uL (ref 0.9–3.3)

## 2011-04-02 MED ORDER — DARBEPOETIN ALFA-POLYSORBATE 200 MCG/0.4ML IJ SOLN
200.0000 ug | Freq: Once | INTRAMUSCULAR | Status: AC
  Administered 2011-04-02: 200 ug via SUBCUTANEOUS
  Filled 2011-04-02: qty 1

## 2011-04-24 ENCOUNTER — Other Ambulatory Visit: Payer: Self-pay | Admitting: *Deleted

## 2011-04-26 ENCOUNTER — Telehealth: Payer: Self-pay | Admitting: Cardiovascular Disease

## 2011-04-26 NOTE — Telephone Encounter (Signed)
New Problem:     Patient was calling in to see if she would be able to receive some samples of Crestor 5mg . Please call back.

## 2011-04-26 NOTE — Telephone Encounter (Signed)
Samples out front pt aware

## 2011-04-30 ENCOUNTER — Ambulatory Visit: Payer: Medicare Other

## 2011-04-30 ENCOUNTER — Other Ambulatory Visit: Payer: Medicare Other | Admitting: Lab

## 2011-05-28 ENCOUNTER — Other Ambulatory Visit (HOSPITAL_BASED_OUTPATIENT_CLINIC_OR_DEPARTMENT_OTHER): Payer: Medicare Other | Admitting: Lab

## 2011-05-28 ENCOUNTER — Ambulatory Visit: Payer: Medicare Other

## 2011-05-28 DIAGNOSIS — D631 Anemia in chronic kidney disease: Secondary | ICD-10-CM

## 2011-05-28 DIAGNOSIS — D649 Anemia, unspecified: Secondary | ICD-10-CM | POA: Diagnosis not present

## 2011-05-28 DIAGNOSIS — N189 Chronic kidney disease, unspecified: Secondary | ICD-10-CM

## 2011-05-28 LAB — CBC WITH DIFFERENTIAL/PLATELET
BASO%: 1.4 % (ref 0.0–2.0)
Basophils Absolute: 0.1 10*3/uL (ref 0.0–0.1)
Eosinophils Absolute: 0.3 10*3/uL (ref 0.0–0.5)
HCT: 33.8 % — ABNORMAL LOW (ref 34.8–46.6)
HGB: 11.5 g/dL — ABNORMAL LOW (ref 11.6–15.9)
MONO#: 0.7 10*3/uL (ref 0.1–0.9)
NEUT#: 2.4 10*3/uL (ref 1.5–6.5)
NEUT%: 49.3 % (ref 38.4–76.8)
WBC: 5 10*3/uL (ref 3.9–10.3)
lymph#: 1.4 10*3/uL (ref 0.9–3.3)

## 2011-05-28 MED ORDER — DARBEPOETIN ALFA-POLYSORBATE 500 MCG/ML IJ SOLN: 200.0000 ug | Freq: Once | INTRAMUSCULAR | Status: DC

## 2011-05-30 ENCOUNTER — Encounter: Payer: Self-pay | Admitting: Internal Medicine

## 2011-05-30 ENCOUNTER — Ambulatory Visit (INDEPENDENT_AMBULATORY_CARE_PROVIDER_SITE_OTHER): Payer: Medicare Other | Admitting: Internal Medicine

## 2011-05-30 ENCOUNTER — Ambulatory Visit (INDEPENDENT_AMBULATORY_CARE_PROVIDER_SITE_OTHER)
Admission: RE | Admit: 2011-05-30 | Discharge: 2011-05-30 | Disposition: A | Discharge status: Home / Self Care | Payer: Medicare Other | Source: Ambulatory Visit | Attending: Internal Medicine | Admitting: Internal Medicine

## 2011-05-30 VITALS — BP 112/58 | HR 83 | Temp 97.3°F | Ht 63.0 in | Wt 181.4 lb

## 2011-05-30 DIAGNOSIS — J209 Acute bronchitis, unspecified: Secondary | ICD-10-CM | POA: Diagnosis not present

## 2011-05-30 DIAGNOSIS — R05 Cough: Secondary | ICD-10-CM | POA: Diagnosis not present

## 2011-05-30 DIAGNOSIS — I10 Essential (primary) hypertension: Secondary | ICD-10-CM

## 2011-05-30 DIAGNOSIS — R062 Wheezing: Secondary | ICD-10-CM | POA: Diagnosis not present

## 2011-05-30 MED ORDER — PREDNISONE 10 MG PO TABS: ORAL_TABLET | ORAL | Status: DC

## 2011-05-30 MED ORDER — AZITHROMYCIN 250 MG PO TABS: ORAL_TABLET | ORAL | Status: AC

## 2011-05-30 MED ORDER — METHYLPREDNISOLONE ACETATE 80 MG/ML IJ SUSP
120.0000 mg | Freq: Once | INTRAMUSCULAR | Status: AC
  Administered 2011-05-30: 120 mg via INTRAMUSCULAR

## 2011-05-30 MED ORDER — HYDROCODONE-HOMATROPINE 5-1.5 MG/5ML PO SYRP: 5.0000 mL | ORAL_SOLUTION | Freq: Four times a day (QID) | ORAL | Status: AC | PRN

## 2011-05-30 NOTE — Assessment & Plan Note (Addendum)
?   Acute brochitis related vs asthma flare - for predpack asd after depomedrol IM today,  to f/u any worsening symptoms or concerns, also gave advair 500/50 sample for bid use for 2 wks

## 2011-05-30 NOTE — Patient Instructions (Signed)
You had the steroid shot today Take all new medications as prescribed - the antibiotic, cough medicine, prednisone Please also use the Advair inhaler at 1 puff twice per day until gone (should last 2 weeks) Continue all other medications as before Please go to XRAY in the Basement for the x-ray test You will be contacted by phone if any changes need to be made immediately.  Otherwise, you will receive a letter about your results with an explanation.

## 2011-06-01 ENCOUNTER — Emergency Department (HOSPITAL_BASED_OUTPATIENT_CLINIC_OR_DEPARTMENT_OTHER)
Admission: EM | Admit: 2011-06-01 | Discharge: 2011-06-01 | Disposition: A | Discharge status: Home / Self Care | Payer: Worker's Compensation | Attending: Emergency Medicine | Admitting: Emergency Medicine

## 2011-06-01 ENCOUNTER — Encounter: Payer: Self-pay | Admitting: Internal Medicine

## 2011-06-01 ENCOUNTER — Emergency Department (INDEPENDENT_AMBULATORY_CARE_PROVIDER_SITE_OTHER): Payer: Worker's Compensation

## 2011-06-01 ENCOUNTER — Encounter (HOSPITAL_BASED_OUTPATIENT_CLINIC_OR_DEPARTMENT_OTHER): Payer: Self-pay | Admitting: *Deleted

## 2011-06-01 DIAGNOSIS — W1809XA Striking against other object with subsequent fall, initial encounter: Secondary | ICD-10-CM

## 2011-06-01 DIAGNOSIS — Z79899 Other long term (current) drug therapy: Secondary | ICD-10-CM | POA: Insufficient documentation

## 2011-06-01 DIAGNOSIS — R0989 Other specified symptoms and signs involving the circulatory and respiratory systems: Secondary | ICD-10-CM

## 2011-06-01 DIAGNOSIS — I10 Essential (primary) hypertension: Secondary | ICD-10-CM | POA: Insufficient documentation

## 2011-06-01 DIAGNOSIS — R0602 Shortness of breath: Secondary | ICD-10-CM

## 2011-06-01 DIAGNOSIS — Y990 Civilian activity done for income or pay: Secondary | ICD-10-CM | POA: Insufficient documentation

## 2011-06-01 DIAGNOSIS — S0083XA Contusion of other part of head, initial encounter: Secondary | ICD-10-CM | POA: Insufficient documentation

## 2011-06-01 DIAGNOSIS — S0003XA Contusion of scalp, initial encounter: Secondary | ICD-10-CM | POA: Insufficient documentation

## 2011-06-01 DIAGNOSIS — W07XXXA Fall from chair, initial encounter: Secondary | ICD-10-CM | POA: Insufficient documentation

## 2011-06-01 DIAGNOSIS — G319 Degenerative disease of nervous system, unspecified: Secondary | ICD-10-CM

## 2011-06-01 DIAGNOSIS — J320 Chronic maxillary sinusitis: Secondary | ICD-10-CM

## 2011-06-01 DIAGNOSIS — S0093XA Contusion of unspecified part of head, initial encounter: Secondary | ICD-10-CM

## 2011-06-01 DIAGNOSIS — J40 Bronchitis, not specified as acute or chronic: Secondary | ICD-10-CM | POA: Insufficient documentation

## 2011-06-01 DIAGNOSIS — R05 Cough: Secondary | ICD-10-CM

## 2011-06-01 DIAGNOSIS — J329 Chronic sinusitis, unspecified: Secondary | ICD-10-CM | POA: Insufficient documentation

## 2011-06-01 DIAGNOSIS — Y9269 Other specified industrial and construction area as the place of occurrence of the external cause: Secondary | ICD-10-CM | POA: Insufficient documentation

## 2011-06-01 MED ORDER — ALBUTEROL SULFATE HFA 108 (90 BASE) MCG/ACT IN AERS
INHALATION_SPRAY | RESPIRATORY_TRACT | Status: AC
  Filled 2011-06-01: qty 6.7

## 2011-06-01 MED ORDER — ALBUTEROL SULFATE HFA 108 (90 BASE) MCG/ACT IN AERS
2.0000 | INHALATION_SPRAY | RESPIRATORY_TRACT | Status: DC | PRN
  Administered 2011-06-01: 2 via RESPIRATORY_TRACT

## 2011-06-01 NOTE — Assessment & Plan Note (Signed)
stable overall by hx and exam, most recent data reviewed with pt, and pt to continue medical treatment as before BP Readings from Last 3 Encounters:  06/01/11 140/64  05/30/11 112/58  04/02/11 134/64

## 2011-06-01 NOTE — Progress Notes (Signed)
Subjective:    Patient ID: [name] [name], female    DOB: 07/30/1931, 78 y.o.   MRN: [id number]  HPI  Here with acute onset mild to mod 2-3 days ST, HA, general weakness and malaise, with prod cough greenish sputum, but Pt denies chest pain, increased sob or doe, wheezing, orthopnea, PND, increased LE swelling, palpitations, dizziness or syncope, except for onset wheezing yesterday with mild sob.  Pt denies new neurological symptoms such as new headache, or facial or extremity weakness or numbness   Pt denies polydipsia, polyuria.   Past Medical History  Diagnosis Date  . Unspecified essential hypertension     Dr [name]  . Other and unspecified hyperlipidemia   . Unspecified disorder resulting from impaired renal function   . Diverticulosis of colon with hemorrhage   . Dehydration   . Diarrhea   . Unspecified contusion of eye   . Syncope and collapse   . Unspecified disorder of urethra and urinary tract   . Unspecified vitamin D deficiency   . Pyelonephritis, unspecified   . Cough   . Unspecified asthma   . Other malaise and fatigue   . Persistent disorder of initiating or maintaining sleep   . Lumbago   . Esophageal reflux   . Diverticulosis of colon (without mention of hemorrhage)   . Depressive disorder, not elsewhere classified   . Anxiety state, unspecified   . Anemia, unspecified   . Acute pancreatitis   . Unspecified osteomyelitis, site unspecified    Past Surgical History  Procedure Date  . Bone marrow biopsy   . Status post right carotid enterectomy     reports that she has never smoked. She does not have any smokeless tobacco history on file. She reports that she drinks alcohol. She reports that she does not use illicit drugs. family history includes COPD in her mother; Cancer in her father; and Hypertension in her other. Allergies  Allergen Reactions  . Codeine Sulfate   . Propoxyphene Napsylate   . Sulfacetamide Sodium-Sulfur    No current  facility-administered medications on file prior to visit.   Current Outpatient Prescriptions on File Prior to Visit  Medication Sig Dispense Refill  . albuterol (PROAIR HFA) 108 (90 BASE) MCG/ACT inhaler Inhale 2 puffs into the lungs every 4 (four) hours as needed.        [name] aspirin 81 MG tablet Take 81 mg by mouth daily.        . calcium carbonate (TUMS EX) 750 MG chewable tablet Chew 2 tablets by mouth daily.        . chlorthalidone (HYGROTON) 25 MG tablet Take 25 mg by mouth daily.        . Cholecalciferol (EQL VITAMIN D3) 1000 UNITS tablet Take 1,000 Units by mouth daily.        . darbepoetin (ARANESP, ALB FREE, SURECLICK) 123XX123 [id number] SOLN Inject 100 mcg into the skin every 30 (thirty) days.       . RABEprazole (ACIPHEX) 20 MG tablet Take 20 mg by mouth as needed.        . rosuvastatin (CRESTOR) 5 MG tablet Take 5 mg by mouth daily.      . valsartan (DIOVAN) 80 MG tablet Take 160 mg by mouth daily.       . vitamin B-12 (CYANOCOBALAMIN) 1000 MCG tablet Take 1,000 mcg by mouth daily.        . Fluticasone-Salmeterol (ADVAIR) 500-50 MCG/DOSE AEPB Inhale 1 puff into the lungs every 12 (twelve) hours.  Review of Systems Review of Systems  Constitutional: Negative for diaphoresis and unexpected weight change.  Eyes: Negative for photophobia and visual disturbance.  Respiratory: Negative for choking and stridor.   Gastrointestinal: Negative for vomiting and blood in stool.  Genitourinary: Negative for hematuria and decreased urine volume.  Musculoskeletal: Negative for gait problem.  Neurological: Negative for tremors and numbness.     Objective:   Physical Exam BP 112/58  Pulse 83  Temp(Src) 97.3 F (36.3 C) (Oral)  Ht 5\' 3"  (1.6 m)  Wt 181 lb 6 oz (82.271 kg)  BMI 32.13 kg/m2  SpO2 97% Physical Exam  VS noted, mild ill Constitutional: Pt appears well-developed and well-nourished.  HENT: Head: Normocephalic.  Right Ear: External ear normal.  Left Ear: External ear normal.    Bilat tm's mild erythema.  Sinus nontender.  Pharynx mild erythema Eyes: Conjunctivae and EOM are normal. Pupils are equal, round, and reactive to light.  Neck: Normal range of motion. Neck supple.  Cardiovascular: Normal rate and regular rhythm.   Pulmonary/Chest: Effort normal and breath sounds mild decreased with bilat mild wheeze Neurological: Pt is alert. Not confused Skin: Skin is warm. No erythema.  Psychiatric: Pt behavior is normal. Thought content normal.     Assessment & Plan:

## 2011-06-01 NOTE — ED Notes (Signed)
D/c home with family

## 2011-06-01 NOTE — ED Notes (Addendum)
Pt states she was getting off work and fell back hitting her head on a chair. ?LOC < 1 min. PERL. Responds approp to questioning. Follows commands. Hematoma to head. Ice being applied.

## 2011-06-01 NOTE — Discharge Instructions (Signed)
Contusion A contusion is a deep bruise. Contusions are the result of an injury that caused bleeding under the skin. The contusion may turn blue, purple, or yellow. Minor injuries will give you a painless contusion, but more severe contusions may stay painful and swollen for a few weeks.  CAUSES  A contusion is usually caused by a blow, trauma, or direct force to an area of the body. SYMPTOMS   Swelling and redness of the injured area.   Bruising of the injured area.   Tenderness and soreness of the injured area.   Pain.  DIAGNOSIS  The diagnosis can be made by taking a history and physical exam. An X-Oddie Kuhlmann, CT scan, or MRI may be needed to determine if there were any associated injuries, such as fractures. TREATMENT  Specific treatment will depend on what area of the body was injured. In general, the best treatment for a contusion is resting, icing, elevating, and applying cold compresses to the injured area. Over-the-counter medicines may also be recommended for pain control. Ask your caregiver what the best treatment is for your contusion. HOME CARE INSTRUCTIONS   Put ice on the injured area.   Put ice in a plastic bag.   Place a towel between your skin and the bag.   Leave the ice on for 15 to 20 minutes, 3 to 4 times a day.   Only take over-the-counter or prescription medicines for pain, discomfort, or fever as directed by your caregiver. Your caregiver may recommend avoiding anti-inflammatory medicines (aspirin, ibuprofen, and naproxen) for 48 hours because these medicines may increase bruising.   Rest the injured area.   If possible, elevate the injured area to reduce swelling.  SEEK IMMEDIATE MEDICAL CARE IF:   You have increased bruising or swelling.   You have pain that is getting worse.   Your swelling or pain is not relieved with medicines.  MAKE SURE YOU:   Understand these instructions.   Will watch your condition.   Will get help right away if you are not  doing well or get worse.  Document Released: 10/17/2004 Document Revised: 12/27/2010 Document Reviewed: 11/12/2010 [facility] Patient Information 2012 [place], [place].Bronchitis Bronchitis is the body's way of reacting to injury and/or infection (inflammation) of the bronchi. Bronchi are the air tubes that extend from the windpipe into the lungs. If the inflammation becomes severe, it may cause shortness of breath. CAUSES  Inflammation may be caused by:  A virus.   Germs (bacteria).   Dust.   Allergens.   Pollutants and many other irritants.  The cells lining the bronchial tree are covered with tiny hairs (cilia). These constantly beat upward, away from the lungs, toward the mouth. This keeps the lungs free of pollutants. When these cells become too irritated and are unable to do their job, mucus begins to develop. This causes the characteristic cough of bronchitis. The cough clears the lungs when the cilia are unable to do their job. Without either of these protective mechanisms, the mucus would settle in the lungs. Then you would develop pneumonia. Smoking is a common cause of bronchitis and can contribute to pneumonia. Stopping this habit is the single most important thing you can do to help yourself. TREATMENT   Your caregiver may prescribe an antibiotic if the cough is caused by bacteria. Also, medicines that open up your airways make it easier to breathe. Your caregiver may also recommend or prescribe an expectorant. It will loosen the mucus to be coughed up. Only take  over-the-counter or prescription medicines for pain, discomfort, or fever as directed by your caregiver.   Removing whatever causes the problem (smoking, for example) is critical to preventing the problem from getting worse.   Cough suppressants may be prescribed for relief of cough symptoms.   Inhaled medicines may be prescribed to help with symptoms now and to help prevent problems from returning.   For those with  recurrent (chronic) bronchitis, there may be a need for steroid medicines.  SEEK IMMEDIATE MEDICAL CARE IF:   During treatment, you develop more pus-like mucus (purulent sputum).   You have a fever.   Your baby is older than 3 months with a rectal temperature of 102 F (38.9 C) or higher.   Your baby is 54 months old or younger with a rectal temperature of 100.4 F (38 C) or higher.   You become progressively more ill.   You have increased difficulty breathing, wheezing, or shortness of breath.  It is necessary to seek immediate medical care if you are elderly or sick from any other disease. MAKE SURE YOU:   Understand these instructions.   Will watch your condition.   Will get help right away if you are not doing well or get worse.  Document Released: 01/07/2005 Document Revised: 12/27/2010 Document Reviewed: 11/17/2007 [facility] Patient Information 2012 [place].Bronchitis Bronchitis is a problem of the air tubes leading to your lungs. This problem makes it hard for air to get in and out of the lungs. You may cough a lot because your air tubes are narrow. Going without care can cause lasting (chronic) bronchitis. HOME CARE   Drink enough fluids to keep your pee (urine) clear or pale yellow.   Use a cool mist humidifier.   Quit smoking if you smoke. If you keep smoking, the bronchitis might not get better.   Only take medicine as told by your doctor.  GET HELP RIGHT AWAY IF:   Coughing keeps you awake.   You start to wheeze.   You become more sick or weak.   You have a hard time breathing or get short of breath.   You cough up blood.   Coughing lasts more than 2 weeks.   You have a fever.   Your baby is older than 3 months with a rectal temperature of 102 F (38.9 C) or higher.   Your baby is 60 months old or younger with a rectal temperature of 100.4 F (38 C) or higher.  MAKE SURE YOU:  Understand these instructions.   Will watch your condition.    Will get help right away if you are not doing well or get worse.  Document Released: 06/26/2007 Document Revised: 12/27/2010 Document Reviewed: 12/09/2008 [facility] Patient Information 2012 [place].

## 2011-06-01 NOTE — ED Provider Notes (Addendum)
History   This chart was scribed for [name] Pollack, MD by Roe Coombs. The patient was seen in room MH11/MH11. Patient's care was started at [place].     CSN: [id number]  Arrival date & time 06/01/11  [id number]   First MD Initiated Contact with Patient 06/01/11 1849      Chief Complaint  Patient presents with  . Fall    (Consider location/radiation/quality/duration/timing/severity/associated sxs/prior treatment) The history is provided by the patient. No language interpreter was used.   [name] [name] is a 78 y.o. female who presents to the Emergency Department complaining of a fall onset several hours ago associated with a contusion on her temple and momentary loss of consciousness. Patient reports that she was standing up to leave work when she slipped and her chair fell down on top of her. Patient could not get up on her own after falling. Patient is not currently on blood thinners. Patient began experiencing symptoms of bronchitis 1 week ago and was diagnosed with bronchitis by Dr. Jenny Reichmann 3 days ago. Patient is currently taking Hycodan and was administered a steroid shot by Dr. Jenny Reichmann. Patient with h/o of asthma. Patient has a prescription for albuterol (inhaler). Patient denies pain, tingling or muscle weakness. Patient has never smoked.  X-Nayomi Tabron, head nec Past Medical History  Diagnosis Date  . Unspecified essential hypertension     Dr [name]  . Other and unspecified hyperlipidemia   . Unspecified disorder resulting from impaired renal function   . Diverticulosis of colon with hemorrhage   . Dehydration   . Diarrhea   . Unspecified contusion of eye   . Syncope and collapse   . Unspecified disorder of urethra and urinary tract   . Unspecified vitamin D deficiency   . Pyelonephritis, unspecified   . Cough   . Unspecified asthma   . Other malaise and fatigue   . Persistent disorder of initiating or maintaining sleep   . Lumbago   . Esophageal reflux   . Diverticulosis of colon  (without mention of hemorrhage)   . Depressive disorder, not elsewhere classified   . Anxiety state, unspecified   . Anemia, unspecified   . Acute pancreatitis   . Unspecified osteomyelitis, site unspecified     Past Surgical History  Procedure Date  . Bone marrow biopsy   . Status post right carotid enterectomy     Family History  Problem Relation Age of Onset  . Hypertension Other   . COPD Mother   . Cancer Father     lymphoma    History  Substance Use Topics  . Smoking status: Never Smoker   . Smokeless tobacco: Not on file  . Alcohol Use: 0.0 oz/week    1-2 Shots of liquor per week    OB History    Grav Para Term Preterm Abortions TAB SAB Ect Mult Living                  Review of Systems A complete 10 system review of systems was obtained and all systems are negative except as noted in the HPI and PMH.   Allergies  Codeine sulfate; Propoxyphene napsylate; and Sulfacetamide sodium-sulfur  Home Medications   Current Outpatient Rx  Name Route Sig Dispense Refill  . ALBUTEROL SULFATE HFA 108 (90 BASE) MCG/ACT IN AERS Inhalation Inhale 2 puffs into the lungs every 4 (four) hours as needed.      . ASPIRIN 81 MG PO TABS Oral Take 81 mg by mouth daily.      [name]  AZITHROMYCIN 250 MG PO TABS  Use as directed 6 each 1  . CALCIUM CARBONATE ANTACID 750 MG PO CHEW Oral Chew 2 tablets by mouth daily.      . CHLORTHALIDONE 25 MG PO TABS Oral Take 25 mg by mouth daily.      . CHOLECALCIFEROL 1000 UNITS PO TABS Oral Take 1,000 Units by mouth daily.      [name] DARBEPOETIN ALFA-POLYSORBATE 100 MCG/0.5ML IJ SOLN Subcutaneous Inject 100 mcg into the skin every 30 (thirty) days.     [name] FLUTICASONE-SALMETEROL 500-50 MCG/DOSE IN AEPB Inhalation Inhale 1 puff into the lungs every 12 (twelve) hours.    [name] HYDROCODONE-HOMATROPINE 5-1.5 MG/5ML PO SYRP Oral Take 5 mLs by mouth every 6 (six) hours as needed for cough. 120 mL 1  . PREDNISONE 10 MG PO TABS  3 tabs by mouth per day for 3 days,2tabs  per day for 3 days,1tab per day for 3 days 18 tablet 0  . RABEPRAZOLE SODIUM 20 MG PO TBEC Oral Take 20 mg by mouth as needed.      [name] ROSUVASTATIN CALCIUM 5 MG PO TABS Oral Take 5 mg by mouth daily.    [name] VALSARTAN 80 MG PO TABS Oral Take 160 mg by mouth daily.     [name] VITAMIN B-12 1000 MCG PO TABS Oral Take 1,000 mcg by mouth daily.        BP 176/63  Pulse 80  Temp(Src) 98.2 F (36.8 C) (Oral)  Resp 20  Ht 5\' 3"  (1.6 m)  Wt 180 lb (81.647 kg)  BMI 31.89 kg/m2  SpO2 97%  Physical Exam  Nursing note and vitals reviewed. Constitutional: She is oriented to person, place, and time. She appears well-developed and well-nourished.  HENT:  Head: Normocephalic and atraumatic.       Contusion on right temple  Eyes: Conjunctivae and EOM are normal. Pupils are equal, round, and reactive to light.  Neck: Normal range of motion. Neck supple.  Cardiovascular: Normal rate and regular rhythm.   Pulmonary/Chest: Effort normal and breath sounds normal.       Patient was coughing during exam  Abdominal: Soft. Bowel sounds are normal.  Musculoskeletal: Normal range of motion.  Neurological: She is alert and oriented to person, place, and time.  Skin: Skin is warm and dry.  Psychiatric: She has a normal mood and affect.   DIAGNOSTIC STUDIES: Oxygen Saturation is 100% on room air, normal by my interpretation.    COORDINATION OF CARE: 6:53PM- Patient informed of current plan for treatment and evaluation and agrees with plan at this time.     ED Course  Procedures (including critical care time)  Labs Reviewed - No data to display Dg Chest 2 View  06/01/2011  *RADIOLOGY REPORT*  Clinical Data: Cough, congestion and shortness of breath.  CHEST - 2 VIEW  Comparison: Plain film chest 05/30/2011 and 04/11/2008.  Findings: The lungs are clear.  Heart size is normal.  No pneumothorax or pleural fluid.  IMPRESSION: No acute disease.  Original Report Authenticated By: Arvid Right. Luther Parody, M.D.   Ct Head  Wo Contrast  06/01/2011  *RADIOLOGY REPORT*  Clinical Data: Hit back of head.  Hematoma.  CT HEAD WITHOUT CONTRAST  Technique:  Contiguous axial images were obtained from the base of the skull through the vertex without contrast.  Comparison: CT head without contrast 04/11/2008.  Findings: Mild generalized atrophy is present.  The periventricular and subcortical white matter hypoattenuation is stable.  No acute cortical infarct,  hemorrhage, or mass lesion is present.  The ventricles are within normal limits.  No significant extra-axial fluid collection is present.  Fluid levels are present in the maxillary sinuses bilaterally. Moderate mucosal thickening is noted as well.  There is scattered opacification of ethmoid air cells bilaterally.  The left frontal sinuses near totally opacified.  Minimal mucosal thickening is noted in the sphenoid sinuses.  The mastoid air cells are clear. The osseous skull is intact.  IMPRESSION:  1.  Mild generalized atrophy white matter disease.  This likely reflects sequelae of chronic microvascular ischemia without significant change. 2.  No acute intracranial abnormality. 3.  Bilateral maxillary sinusitis. 4.  Mucosal thickening within the other paranasal sinuses without fluid levels.  Original Report Authenticated By: Resa Miner. MATTERN, M.D.     No diagnosis found.    MDM          [name] Pollack, MD 06/01/11 2023  I personally performed the services described in this documentation, which was scribed in my presence. The recorded information has been reviewed and considered.   [name] Pollack, MD 06/01/11 [id number]  [name] Pollack, MD 06/01/11 2040

## 2011-06-01 NOTE — Assessment & Plan Note (Signed)
Mild to mod, for antibx course,  to f/u any worsening symptoms or concerns 

## 2011-06-04 ENCOUNTER — Ambulatory Visit: Payer: Medicare Other

## 2011-06-04 ENCOUNTER — Other Ambulatory Visit: Payer: Medicare Other | Admitting: Lab

## 2011-06-07 ENCOUNTER — Telehealth: Payer: Self-pay | Admitting: Cardiovascular Disease

## 2011-06-07 DIAGNOSIS — L570 Actinic keratosis: Secondary | ICD-10-CM | POA: Diagnosis not present

## 2011-06-07 DIAGNOSIS — L821 Other seborrheic keratosis: Secondary | ICD-10-CM | POA: Diagnosis not present

## 2011-06-07 DIAGNOSIS — D239 Other benign neoplasm of skin, unspecified: Secondary | ICD-10-CM | POA: Diagnosis not present

## 2011-06-07 NOTE — Telephone Encounter (Signed)
New Problem:    Patient called in wanting to know if there were any samples of rosuvastatin (CRESTOR) 5 MG tablet available for her to have.  Please call back.

## 2011-06-07 NOTE — Telephone Encounter (Signed)
Pt aware Crestor 5 mg samples will be ready for pick up today. Horton Chin RN

## 2011-07-02 ENCOUNTER — Ambulatory Visit: Payer: Medicare Other

## 2011-07-02 ENCOUNTER — Other Ambulatory Visit: Payer: Medicare Other | Admitting: Lab

## 2011-07-16 DIAGNOSIS — G589 Mononeuropathy, unspecified: Secondary | ICD-10-CM | POA: Diagnosis not present

## 2011-07-16 DIAGNOSIS — F09 Unspecified mental disorder due to known physiological condition: Secondary | ICD-10-CM | POA: Diagnosis not present

## 2011-07-23 ENCOUNTER — Encounter: Payer: Self-pay | Admitting: Oncology

## 2011-07-23 ENCOUNTER — Other Ambulatory Visit (HOSPITAL_BASED_OUTPATIENT_CLINIC_OR_DEPARTMENT_OTHER): Payer: Medicare Other | Admitting: Lab

## 2011-07-23 ENCOUNTER — Ambulatory Visit (HOSPITAL_BASED_OUTPATIENT_CLINIC_OR_DEPARTMENT_OTHER): Payer: Medicare Other | Admitting: Oncology

## 2011-07-23 VITALS — BP 123/71 | HR 78 | Temp 97.0°F | Ht 63.0 in | Wt 177.4 lb

## 2011-07-23 DIAGNOSIS — N189 Chronic kidney disease, unspecified: Secondary | ICD-10-CM | POA: Diagnosis not present

## 2011-07-23 DIAGNOSIS — M810 Age-related osteoporosis without current pathological fracture: Secondary | ICD-10-CM

## 2011-07-23 DIAGNOSIS — N039 Chronic nephritic syndrome with unspecified morphologic changes: Secondary | ICD-10-CM

## 2011-07-23 DIAGNOSIS — D631 Anemia in chronic kidney disease: Secondary | ICD-10-CM

## 2011-07-23 LAB — COMPREHENSIVE METABOLIC PANEL
ALT: 20 U/L (ref 0–35)
CO2: 26 mEq/L (ref 19–32)
Calcium: 9.3 mg/dL (ref 8.4–10.5)
Chloride: 101 mEq/L (ref 96–112)
Creatinine, Ser: 1.69 mg/dL — ABNORMAL HIGH (ref 0.50–1.10)
Sodium: 138 mEq/L (ref 135–145)
Total Protein: 6.8 g/dL (ref 6.0–8.3)

## 2011-07-23 LAB — CBC WITH DIFFERENTIAL/PLATELET
BASO%: 1.1 % (ref 0.0–2.0)
Eosinophils Absolute: 0.2 10*3/uL (ref 0.0–0.5)
HCT: 32.3 % — ABNORMAL LOW (ref 34.8–46.6)
MCHC: 33.8 g/dL (ref 31.5–36.0)
MONO#: 0.6 10*3/uL (ref 0.1–0.9)
NEUT#: 3.6 10*3/uL (ref 1.5–6.5)
NEUT%: 61 % (ref 38.4–76.8)
WBC: 5.9 10*3/uL (ref 3.9–10.3)
lymph#: 1.5 10*3/uL (ref 0.9–3.3)

## 2011-07-23 LAB — LACTATE DEHYDROGENASE: LDH: 200 U/L (ref 94–250)

## 2011-07-23 MED ORDER — DARBEPOETIN ALFA-POLYSORBATE 200 MCG/0.4ML IJ SOLN
200.0000 ug | Freq: Once | INTRAMUSCULAR | Status: AC
  Administered 2011-07-23: 200 ug via SUBCUTANEOUS
  Filled 2011-07-23: qty 0.4

## 2011-07-23 NOTE — Progress Notes (Signed)
This office note has been dictated.  [id number]

## 2011-07-23 NOTE — Progress Notes (Signed)
CC:   [name] [name]. Plotnikov, MD [name] [name], M.D. [name] [name]. [name] Cancel, MD, [facility]  PROBLEM LIST:  1. Anemia felt to be secondary to renal insufficiency. We have been  following the patient since February 2009 for this problem. She  had bone marrow aspirate and biopsy on 05/14/2007 with negative  findings and negative cytogenetics.  2. Mild renal insufficiency.  3. Hypertension.  4. Dyslipidemia.  5. Peripheral vascular disease status post carotid endarterectomy on  the right side in November 2008.  6. History of atrial fibrillation.  7. GERD.  8. Osteoporosis.  9. Depression.  10.History of pancreatitis in 1992.  11.Irritable bowel syndrome.  12.Diverticulosis status post sigmoid resection in September 1995.  13.Status post GI bleeding, felt to be due to diverticulosis.   MEDICATIONS: 1. Albuterol inhaler 2 puffs every 4 hours as needed. 2. Aspirin 81 mg daily. 3. Chlorthalidone 12.5 mg daily. 4. Cholecalciferol 1000 units daily. 5. AcipHex 20 mg daily as needed. 6. Crestor 5 mg daily. 7. Cyanocobalamin 1000 mcg daily. 8. Aranesp 200 mcg subcu every 8 weeks for hemoglobin less than or     equal to 11.   SMOKING HISTORY:  The patient has been a nonsmoker.  HISTORY:  [name] [name] was seen today for followup of her anemia felt to be secondary to renal insufficiency.  The patient was last seen by [name] on 02/05/2011 at which time we increased the interval between CBC and possible Aranesp from every 4 weeks to every 8 weeks.  It will be recalled the patient had a bone marrow aspirate and biopsy carried out on May 14, 2007.  This was basically negative.  The patient continues to do well in general.  She lives in her own home by herself.  She drives.  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  The patient tells me that she had a couple of falls in the last few months.  On 1 occasion, she fell and hit her head and had to go to the emergency room at [facility]  facility off of [address].  She has had some symptoms on standing; however, her pulse and blood pressure today did not show orthostatic hypotension.  The patient is without complaints today.  In looking over the 6 months she required Aranesp on 04/02/2011 when her hemoglobin was 10.1 and will be getting Aranesp today with her hemoglobin of 10.9.  PHYSICAL EXAMINATION:  The patient looks well.  She recently turned 64. Weight is 177.4 pounds, height 5 feet 3 inches, body surface area 1.89 m2.  Blood pressure 114/58.  Other vital signs are normal.  There is no scleral icterus.  Mouth and pharynx are benign.  There is no peripheral adenopathy palpable.  She has a scar from her right carotid endarterectomy carried out in November 2008.  Heart/Lungs:  Normal. Abdomen:  Obese, nontender with no organomegaly or masses palpable with the patient sitting.  Extremities:  No peripheral edema or clubbing. Neurologic:  Grossly normal.  We did pulse and blood pressures with the patient lying, sitting and standing, and there were no significant orthostatic changes.  LABORATORY DATA:  White count today 5.9, ANC 3.6, hemoglobin 10.9, hematocrit 32.3 platelets 380,000.  On 05/28/2011 hemoglobin was 11.5, and on 04/02/2011 hemoglobin was 10.1.  Chemistries and ferritin today are pending.  Chemistries from 02/05/2011 notable for a BUN of 37, creatinine 1.60.  Ferritin was 155 and iron saturation 37%.  IMAGING STUDIES: 1. Digital screening mammogram on 11/19/2010 was  negative. 2. Chest x-ray, 2 view, from 05/30/2011 was negative. 3. CT scan of the head without IV contrast on 06/01/2011 showed mild     generalized atrophy and white matter disease felt to reflect     chronic microvascular ischemia.  There were no significant changes     when compared with the CT scan carried out on 04/11/2008.  There     were no acute abnormalities.  There was bilateral maxillary     sinusitis and some mucosal thickening  within the paranasal sinuses     without fluid levels. 4. Chest x-ray, 2 view, from 06/01/2011 was negative.   IMPRESSION AND PLAN:  [name] continues to do quite well.  She is really not requiring very much Aranesp.  As noted above, she did receive Aranesp 200 mcg subcu on 04/02/2011 for a hemoglobin of 10.1.  She will receive Aranesp 200 mcg subcu today.  Will continue to check CBCs every 8 weeks and give Aranesp 200 mcg subcu whenever the hemoglobin is less than or equal to 11.  We will plan to see [name] [name] again in about 6 months, which will be early January 2014.  We will check CBC, chemistries and ferritin at that visit.    ______________________________ Jeanie Cooks, M.D. DSM/MEDQ  D:  07/23/2011  T:  07/23/2011  Job:  [id number]

## 2011-07-26 ENCOUNTER — Telehealth: Payer: Self-pay | Admitting: *Deleted

## 2011-07-26 ENCOUNTER — Telehealth: Payer: Self-pay | Admitting: Oncology

## 2011-07-26 NOTE — Telephone Encounter (Signed)
Patient has been scheduled for the labs and aransesp every eight weeks lab and md appointment in six months

## 2011-07-26 NOTE — Telephone Encounter (Signed)
lmonvm for pt re appts for 8/27, 10/22, 01/07/2012 and 01/28/2012 and 03/03/2012. New schedule mailed today.

## 2011-07-30 ENCOUNTER — Other Ambulatory Visit: Payer: Medicare Other | Admitting: Lab

## 2011-07-30 ENCOUNTER — Ambulatory Visit: Payer: Medicare Other

## 2011-07-31 ENCOUNTER — Telehealth: Payer: Self-pay | Admitting: Cardiovascular Disease

## 2011-07-31 NOTE — Telephone Encounter (Signed)
New msg Pt wants to get samples of crestor 5 mg. Please let her know

## 2011-07-31 NOTE — Telephone Encounter (Signed)
PT AWARE CRESTOR SAMPLES LEFT AT FRONT DESK  FOR PICK UP .Darreld Mclean

## 2011-08-05 ENCOUNTER — Ambulatory Visit (INDEPENDENT_AMBULATORY_CARE_PROVIDER_SITE_OTHER): Payer: Medicare Other | Admitting: Internal Medicine

## 2011-08-05 ENCOUNTER — Encounter: Payer: Self-pay | Admitting: Internal Medicine

## 2011-08-05 VITALS — BP 132/88 | HR 84 | Temp 98.1°F | Ht 63.0 in

## 2011-08-05 DIAGNOSIS — L255 Unspecified contact dermatitis due to plants, except food: Secondary | ICD-10-CM

## 2011-08-05 MED ORDER — METHYLPREDNISOLONE ACETATE 80 MG/ML IJ SUSP
120.0000 mg | Freq: Once | INTRAMUSCULAR | Status: AC
  Administered 2011-08-05: 120 mg via INTRAMUSCULAR

## 2011-08-05 MED ORDER — PREDNISONE (PAK) 10 MG PO TABS: 10.0000 mg | ORAL_TABLET | ORAL | Status: AC

## 2011-08-05 NOTE — Patient Instructions (Signed)
It was good to see you today. Medrol shot today  Also use prednisone taper x next 6 days - Your prescription(s) have been submitted to your pharmacy. Please take as directed and contact our office if you believe you are having problem(s) with the medication(s). Continue calamine lotion, bathe with cool water and keep blisters covered until "dry" Poison Ivy Poison ivy is a inflammation of the skin (contact dermatitis) caused by touching the allergens on the leaves of the ivy plant following previous exposure to the plant. The rash usually appears 48 hours after exposure. The rash is usually bumps (papules) or blisters (vesicles) in a linear pattern. Depending on your own sensitivity, the rash may simply cause redness and itching, or it may also progress to blisters which may break open. These must be well cared for to prevent secondary bacterial (germ) infection, followed by scarring. Keep any open areas dry, clean, dressed, and covered with an antibacterial ointment if needed. The eyes may also get puffy. The puffiness is worst in the morning and gets better as the day progresses. This dermatitis usually heals without scarring, within 2 to 3 weeks without treatment. HOME CARE INSTRUCTIONS   Thoroughly wash with soap and water as soon as you have been exposed to poison ivy. You have about one half hour to remove the plant resin before it will cause the rash. This washing will destroy the oil or antigen on the skin that is causing, or will cause, the rash. Be sure to wash under your fingernails as any plant resin there will continue to spread the rash. Do not rub skin vigorously when washing affected area. Poison ivy cannot spread if no oil from the plant remains on your body. A rash that has progressed to weeping sores will not spread the rash unless you have not washed thoroughly. It is also important to wash any clothes you have been wearing as these may carry active allergens. The rash will return if you  wear the unwashed clothing, even several days later. Avoidance of the plant in the future is the best measure. Poison ivy plant can be recognized by the number of leaves. Generally, poison ivy has three leaves with flowering branches on a single stem. Diphenhydramine may be purchased over the counter and used as needed for itching. Do not drive with this medication if it makes you drowsy.Ask your caregiver about medication for children. SEEK MEDICAL CARE IF:  Open sores develop.   Redness spreads beyond area of rash.   You notice purulent (pus-like) discharge.   You have increased pain.   Other signs of infection develop (such as fever).  Document Released: 01/05/2000 Document Revised: 12/27/2010 Document Reviewed: 11/23/2008 [facility] Patient Information 2012 [place].

## 2011-08-05 NOTE — Progress Notes (Signed)
  Subjective:    Patient ID: [name] [name], female    DOB: 01-Oct-1931, 78 y.o.   MRN: [id number]  Rash This is a new problem. The current episode started in the past 7 days. The problem has been gradually worsening since onset. The affected locations include the left arm, right arm, chest and neck. The rash is characterized by blistering, itchiness and redness. She was exposed to plant contact. Pertinent negatives include no cough, facial edema, fever or shortness of breath. Past treatments include anti-itch cream. The treatment provided no relief. Her past medical history is significant for allergies and eczema.   Past Medical History  Diagnosis Date  . Unspecified essential hypertension     Dr [name]  . Other and unspecified hyperlipidemia   . Unspecified disorder resulting from impaired renal function   . Diverticulosis of colon with hemorrhage   . Unspecified vitamin D deficiency   . Unspecified asthma   . Persistent disorder of initiating or maintaining sleep   . Lumbago   . Esophageal reflux   . Diverticulosis of colon (without mention of hemorrhage)   . Depressive disorder, not elsewhere classified   . Anxiety state, unspecified   . Anemia, unspecified   . Unspecified osteomyelitis, site unspecified     Review of Systems  Constitutional: Negative for fever and unexpected weight change.  Respiratory: Negative for cough and shortness of breath.   Skin: Positive for rash.       Objective:   Physical Exam BP 132/88  Pulse 84  Temp 98.1 F (36.7 C) (Oral)  Ht 5\' 3"  (1.6 m)  SpO2 96% Gen: NAD Skin: blistering contact dermatitis L>R forearm flexor surface - linear pattern evident - no cellulitis  Lab Results  Component Value Date   WBC 5.9 07/23/2011   HGB 10.9* 07/23/2011   HCT 32.3* 07/23/2011   PLT 380 07/23/2011   GLUCOSE 89 07/23/2011   CHOL 158 06/01/2010   TRIG 53.0 06/01/2010   HDL 75.20 06/01/2010   LDLCALC 72 06/01/2010   ALT 20 07/23/2011   AST 26 07/23/2011   NA 138  07/23/2011   K 3.8 07/23/2011   CL 101 07/23/2011   CREATININE 1.69* 07/23/2011   BUN 40* 07/23/2011   CO2 26 07/23/2011   TSH 2.86 06/01/2010   INR 1.1 09/08/2008       Assessment & Plan:  poison ivy dermatitis - hx same IM medrol and pred taper x 6 days-  continue topical calamine and OTC steroids, cover blistered area as needed

## 2011-08-16 DIAGNOSIS — G629 Polyneuropathy, unspecified: Secondary | ICD-10-CM | POA: Insufficient documentation

## 2011-09-03 ENCOUNTER — Other Ambulatory Visit: Payer: Medicare Other | Admitting: Lab

## 2011-09-03 ENCOUNTER — Ambulatory Visit: Payer: Medicare Other

## 2011-09-10 DIAGNOSIS — F09 Unspecified mental disorder due to known physiological condition: Secondary | ICD-10-CM | POA: Diagnosis not present

## 2011-09-10 DIAGNOSIS — F411 Generalized anxiety disorder: Secondary | ICD-10-CM | POA: Diagnosis not present

## 2011-09-10 DIAGNOSIS — G2581 Restless legs syndrome: Secondary | ICD-10-CM | POA: Diagnosis not present

## 2011-09-16 DIAGNOSIS — G2581 Restless legs syndrome: Secondary | ICD-10-CM | POA: Insufficient documentation

## 2011-09-17 ENCOUNTER — Other Ambulatory Visit (HOSPITAL_BASED_OUTPATIENT_CLINIC_OR_DEPARTMENT_OTHER): Payer: Medicare Other | Admitting: Lab

## 2011-09-17 ENCOUNTER — Ambulatory Visit (HOSPITAL_BASED_OUTPATIENT_CLINIC_OR_DEPARTMENT_OTHER): Payer: Medicare Other

## 2011-09-17 VITALS — BP 133/74 | HR 79 | Temp 98.7°F

## 2011-09-17 DIAGNOSIS — D649 Anemia, unspecified: Secondary | ICD-10-CM

## 2011-09-17 DIAGNOSIS — D631 Anemia in chronic kidney disease: Secondary | ICD-10-CM

## 2011-09-17 DIAGNOSIS — N189 Chronic kidney disease, unspecified: Secondary | ICD-10-CM

## 2011-09-17 DIAGNOSIS — N039 Chronic nephritic syndrome with unspecified morphologic changes: Secondary | ICD-10-CM

## 2011-09-17 LAB — CBC WITH DIFFERENTIAL/PLATELET
Basophils Absolute: 0.1 10*3/uL (ref 0.0–0.1)
Eosinophils Absolute: 0.3 10*3/uL (ref 0.0–0.5)
HGB: 10.8 g/dL — ABNORMAL LOW (ref 11.6–15.9)
LYMPH%: 17.1 % (ref 14.0–49.7)
MCV: 95.5 fL (ref 79.5–101.0)
MONO%: 9.4 % (ref 0.0–14.0)
NEUT#: 4.7 10*3/uL (ref 1.5–6.5)
NEUT%: 68.6 % (ref 38.4–76.8)
Platelets: 381 10*3/uL (ref 145–400)

## 2011-09-17 MED ORDER — DARBEPOETIN ALFA-POLYSORBATE 200 MCG/0.4ML IJ SOLN
200.0000 ug | Freq: Once | INTRAMUSCULAR | Status: AC
  Administered 2011-09-17: 200 ug via SUBCUTANEOUS
  Filled 2011-09-17: qty 0.4

## 2011-09-30 ENCOUNTER — Telehealth: Payer: Self-pay | Admitting: Cardiovascular Disease

## 2011-09-30 NOTE — Telephone Encounter (Signed)
Pt calling re samples of crestor 5mg 

## 2011-09-30 NOTE — Telephone Encounter (Signed)
CRESTOR 5 MG SAMPLES   LEFT AT FRONT DESK FOR PT TO PICK UP PT AWARE .Adonis Housekeeper

## 2011-10-01 ENCOUNTER — Ambulatory Visit: Payer: Medicare Other

## 2011-10-01 ENCOUNTER — Other Ambulatory Visit: Payer: Medicare Other | Admitting: Lab

## 2011-10-07 DIAGNOSIS — G3184 Mild cognitive impairment, so stated: Secondary | ICD-10-CM | POA: Insufficient documentation

## 2011-10-24 ENCOUNTER — Ambulatory Visit (INDEPENDENT_AMBULATORY_CARE_PROVIDER_SITE_OTHER): Payer: Medicare Other | Admitting: Cardiovascular Disease

## 2011-10-24 ENCOUNTER — Other Ambulatory Visit: Payer: Self-pay | Admitting: Internal Medicine

## 2011-10-24 ENCOUNTER — Encounter: Payer: Self-pay | Admitting: Cardiovascular Disease

## 2011-10-24 VITALS — BP 141/73 | HR 73 | Wt 178.0 lb

## 2011-10-24 DIAGNOSIS — E785 Hyperlipidemia, unspecified: Secondary | ICD-10-CM | POA: Diagnosis not present

## 2011-10-24 DIAGNOSIS — I2510 Atherosclerotic heart disease of native coronary artery without angina pectoris: Secondary | ICD-10-CM

## 2011-10-24 DIAGNOSIS — I6529 Occlusion and stenosis of unspecified carotid artery: Secondary | ICD-10-CM

## 2011-10-24 DIAGNOSIS — I10 Essential (primary) hypertension: Secondary | ICD-10-CM

## 2011-10-24 NOTE — Patient Instructions (Signed)
Your physician wants you to follow-up in: [place] will receive a reminder letter in the mail two months in advance. If you don't receive a letter, please call our office to schedule the follow-up appointment. Your physician recommends that you continue on your current medications as directed. Please refer to the Current Medication list given to you today.  Your physician has requested that you have a carotid duplex. This test is an ultrasound of the carotid arteries in your neck. It looks at blood flow through these arteries that supply the brain with blood. Allow one hour for this exam. There are no restrictions or special instructions. DX STEN[facility]IS

## 2011-10-24 NOTE — Assessment & Plan Note (Signed)
40-59% bilateral disease  F/U duplex this month  ASA

## 2011-10-24 NOTE — Assessment & Plan Note (Signed)
Cholesterol is at goal.  Continue current dose of statin and diet Rx.  No myalgias or side effects.  F/U  LFT's in 6 months. Lab Results  Component Value Date   LDLCALC 72 06/01/2010

## 2011-10-24 NOTE — Assessment & Plan Note (Signed)
Well controlled.  Continue current medications and low sodium Dash type diet.    

## 2011-10-24 NOTE — Progress Notes (Signed)
Patient ID: [name] [name], female   DOB: 1931-07-29, 78 y.o.   MRN: [id number] [name] [name] returns today for F/U of HTN, palpitations and fatigue. She was hospitalized recently for a diverticular bleed. This seems stable and she has F/U with Dr. Earlean [name]. She has had palpitations. I reviewed her recenet event monitor and it was benighn with no significant arrythmias. She has been compliant with her BP meds. She has had a previous RCEA by Dr. Amedeo [name]. She has residua40-59% bilateral stenosis by duplex 10/12 She will have a F/U duplex in10/13 She has had no TIA like symptoms and is off asa now due to her diverticular bleed. She denies SSCP, palpitations, dyspnea, PND orthopnea and edema   She is on a study at [facility] for BP And gets it followed every 3 months  Needs F/U mamogram and carotids  ROS: Denies fever, malais, weight loss, blurry vision, decreased visual acuity, cough, sputum, SOB, hemoptysis, pleuritic pain, palpitaitons, heartburn, abdominal pain, melena, lower extremity edema, claudication, or rash.  All other systems reviewed and negative  General: Affect appropriate Healthy:  appears stated age 61: normal Neck supple with no adenopathy JVP normal no bruits no thyromegaly Lungs clear with no wheezing and good diaphragmatic motion Heart:  S1/S2 no murmur, no rub, gallop or click PMI normal Abdomen: benighn, BS positve, no tenderness, no AAA no bruit.  No HSM or HJR Distal pulses intact with no bruits No edema Neuro non-focal Skin warm and dry No muscular weakness   Current Outpatient Prescriptions  Medication Sig Dispense Refill  . albuterol (PROAIR HFA) 108 (90 BASE) MCG/ACT inhaler Inhale 2 puffs into the lungs every 4 (four) hours as needed.        [name] amLODipine (NORVASC) 2.5 MG tablet Take 2.5 mg by mouth daily.      [name] aspirin 81 MG tablet Take 81 mg by mouth daily.        . chlorthalidone (HYGROTON) 25 MG tablet Take 12.5 mg by mouth daily.       . Cholecalciferol (EQL VITAMIN  D3) 1000 UNITS tablet Take 1,000 Units by mouth daily.        . darbepoetin (ARANESP, ALB FREE, SURECLICK) [id number] [id number] SOLN Inject 100 mcg into the skin every 8 (eight) weeks.       . RABEprazole (ACIPHEX) 20 MG tablet Take 20 mg by mouth as needed.        . rosuvastatin (CRESTOR) 5 MG tablet Take 5 mg by mouth daily.      . valsartan (DIOVAN) 160 MG tablet Take 160 mg by mouth daily.      . vitamin B-12 (CYANOCOBALAMIN) 1000 MCG tablet Take 1,000 mcg by mouth daily.          Allergies  Codeine sulfate; Propoxyphene napsylate; and Sulfacetamide sodium-sulfur  Electrocardiogram:  NSR rate 71  Normal ECG  Assessment and Plan

## 2011-10-24 NOTE — Assessment & Plan Note (Signed)
Stable with no angina and good activity level.  Continue medical Rx  

## 2011-10-29 ENCOUNTER — Other Ambulatory Visit: Payer: Medicare Other | Admitting: Lab

## 2011-10-29 ENCOUNTER — Ambulatory Visit: Payer: Medicare Other

## 2011-11-08 ENCOUNTER — Encounter (INDEPENDENT_AMBULATORY_CARE_PROVIDER_SITE_OTHER): Payer: Medicare Other

## 2011-11-08 DIAGNOSIS — I6529 Occlusion and stenosis of unspecified carotid artery: Secondary | ICD-10-CM | POA: Diagnosis not present

## 2011-11-12 ENCOUNTER — Ambulatory Visit: Payer: Medicare Other

## 2011-11-12 ENCOUNTER — Other Ambulatory Visit (HOSPITAL_BASED_OUTPATIENT_CLINIC_OR_DEPARTMENT_OTHER): Payer: Medicare Other | Admitting: Lab

## 2011-11-12 DIAGNOSIS — N189 Chronic kidney disease, unspecified: Secondary | ICD-10-CM | POA: Diagnosis not present

## 2011-11-12 DIAGNOSIS — D631 Anemia in chronic kidney disease: Secondary | ICD-10-CM

## 2011-11-12 LAB — CBC WITH DIFFERENTIAL/PLATELET
Eosinophils Absolute: 0.2 10*3/uL (ref 0.0–0.5)
LYMPH%: 29 % (ref 14.0–49.7)
MCHC: 34 g/dL (ref 31.5–36.0)
MCV: 94.5 fL (ref 79.5–101.0)
MONO%: 9.7 % (ref 0.0–14.0)
NEUT#: 3.4 10*3/uL (ref 1.5–6.5)
Platelets: 364 10*3/uL (ref 145–400)
RBC: 3.75 10*6/uL (ref 3.70–5.45)

## 2011-11-12 MED ORDER — DARBEPOETIN ALFA-POLYSORBATE 500 MCG/ML IJ SOLN: 200.0000 ug | Freq: Once | INTRAMUSCULAR | Status: DC

## 2011-11-14 NOTE — Addendum Note (Signed)
Addended by: Levora Angel L on: 11/14/2011 12:02 PM   Modules accepted: Orders

## 2011-11-29 ENCOUNTER — Other Ambulatory Visit: Payer: Self-pay | Admitting: Internal Medicine

## 2011-11-29 DIAGNOSIS — Z1231 Encounter for screening mammogram for malignant neoplasm of breast: Secondary | ICD-10-CM

## 2011-12-23 ENCOUNTER — Telehealth: Payer: Self-pay | Admitting: Cardiovascular Disease

## 2011-12-23 NOTE — Telephone Encounter (Signed)
CRESTOR 5 MG SAMPLES LEFT AT FRONT DESK FOR PT TO PICK UP .Adonis Housekeeper

## 2011-12-23 NOTE — Telephone Encounter (Signed)
plz return call to pt [phone] regarding Crestor medication samples.

## 2012-01-01 ENCOUNTER — Ambulatory Visit
Admission: RE | Admit: 2012-01-01 | Discharge: 2012-01-01 | Disposition: A | Discharge status: Home / Self Care | Payer: Medicare Other | Source: Ambulatory Visit | Attending: Internal Medicine | Admitting: Internal Medicine

## 2012-01-01 DIAGNOSIS — Z1231 Encounter for screening mammogram for malignant neoplasm of breast: Secondary | ICD-10-CM | POA: Diagnosis not present

## 2012-01-07 ENCOUNTER — Ambulatory Visit (HOSPITAL_BASED_OUTPATIENT_CLINIC_OR_DEPARTMENT_OTHER): Payer: Medicare Other

## 2012-01-07 ENCOUNTER — Other Ambulatory Visit (HOSPITAL_BASED_OUTPATIENT_CLINIC_OR_DEPARTMENT_OTHER): Payer: Medicare Other | Admitting: Lab

## 2012-01-07 VITALS — BP 148/69 | HR 81 | Temp 97.8°F

## 2012-01-07 DIAGNOSIS — N189 Chronic kidney disease, unspecified: Secondary | ICD-10-CM

## 2012-01-07 DIAGNOSIS — D631 Anemia in chronic kidney disease: Secondary | ICD-10-CM

## 2012-01-07 DIAGNOSIS — N039 Chronic nephritic syndrome with unspecified morphologic changes: Secondary | ICD-10-CM | POA: Diagnosis not present

## 2012-01-07 LAB — CBC WITH DIFFERENTIAL/PLATELET
BASO%: 0.9 % (ref 0.0–2.0)
EOS%: 3 % (ref 0.0–7.0)
LYMPH%: 26.3 % (ref 14.0–49.7)
MCHC: 33.4 g/dL (ref 31.5–36.0)
MONO#: 0.8 10*3/uL (ref 0.1–0.9)
MONO%: 10 % (ref 0.0–14.0)
Platelets: 408 10*3/uL — ABNORMAL HIGH (ref 145–400)
RBC: 3.23 10*6/uL — ABNORMAL LOW (ref 3.70–5.45)
WBC: 8 10*3/uL (ref 3.9–10.3)

## 2012-01-07 MED ORDER — DARBEPOETIN ALFA-POLYSORBATE 200 MCG/0.4ML IJ SOLN
200.0000 ug | Freq: Once | INTRAMUSCULAR | Status: AC
  Administered 2012-01-07: 200 ug via SUBCUTANEOUS
  Filled 2012-01-07: qty 0.4

## 2012-01-27 ENCOUNTER — Telehealth: Payer: Self-pay

## 2012-01-27 ENCOUNTER — Telehealth: Payer: Self-pay | Admitting: Cardiovascular Disease

## 2012-01-27 MED ORDER — ROSUVASTATIN CALCIUM 5 MG PO TABS: 5.0000 mg | ORAL_TABLET | Freq: Every day | ORAL | Status: DC

## 2012-01-27 NOTE — Telephone Encounter (Signed)
Call in generic lipitor 40mg  daily number 30 with 6 refills

## 2012-01-27 NOTE — Telephone Encounter (Signed)
New problem:    Samples of crestor 5 mg.

## 2012-01-27 NOTE — Telephone Encounter (Signed)
Patient called was told office out of crestor samples.Crestor 5 mg prescription sent to [facility] at [employer].

## 2012-01-27 NOTE — Telephone Encounter (Signed)
Received call from patient stating she cannot afford crestor.States went to pick up prescription and a 30 day supply was $164.00.Patient request a cheaper cholesterol medication.Message sent to [place].

## 2012-01-28 ENCOUNTER — Telehealth: Payer: Self-pay | Admitting: Oncology

## 2012-01-28 ENCOUNTER — Encounter: Payer: Self-pay | Admitting: Family

## 2012-01-28 ENCOUNTER — Other Ambulatory Visit (HOSPITAL_BASED_OUTPATIENT_CLINIC_OR_DEPARTMENT_OTHER): Payer: Medicare Other | Admitting: Lab

## 2012-01-28 ENCOUNTER — Ambulatory Visit (HOSPITAL_BASED_OUTPATIENT_CLINIC_OR_DEPARTMENT_OTHER): Payer: Medicare Other | Admitting: Family

## 2012-01-28 VITALS — BP 152/68 | HR 75 | Temp 97.4°F | Resp 18 | Ht 63.0 in | Wt 180.5 lb

## 2012-01-28 DIAGNOSIS — N289 Disorder of kidney and ureter, unspecified: Secondary | ICD-10-CM

## 2012-01-28 DIAGNOSIS — D631 Anemia in chronic kidney disease: Secondary | ICD-10-CM

## 2012-01-28 DIAGNOSIS — D649 Anemia, unspecified: Secondary | ICD-10-CM

## 2012-01-28 DIAGNOSIS — M810 Age-related osteoporosis without current pathological fracture: Secondary | ICD-10-CM

## 2012-01-28 DIAGNOSIS — F329 Major depressive disorder, single episode, unspecified: Secondary | ICD-10-CM

## 2012-01-28 DIAGNOSIS — N189 Chronic kidney disease, unspecified: Secondary | ICD-10-CM | POA: Diagnosis not present

## 2012-01-28 LAB — COMPREHENSIVE METABOLIC PANEL (CC13)
ALT: 14 U/L (ref 0–55)
AST: 24 U/L (ref 5–34)
Albumin: 3.8 g/dL (ref 3.5–5.0)
Alkaline Phosphatase: 93 U/L (ref 40–150)
Calcium: 9.5 mg/dL (ref 8.4–10.4)
Chloride: 104 mEq/L (ref 98–107)
Potassium: 4.1 mEq/L (ref 3.5–5.1)
Sodium: 137 mEq/L (ref 136–145)

## 2012-01-28 LAB — CBC WITH DIFFERENTIAL/PLATELET
BASO%: 1.3 % (ref 0.0–2.0)
HCT: 35.5 % (ref 34.8–46.6)
MCHC: 34.2 g/dL (ref 31.5–36.0)
MONO#: 0.7 10*3/uL (ref 0.1–0.9)
RBC: 3.75 10*6/uL (ref 3.70–5.45)
RDW: 14.7 % — ABNORMAL HIGH (ref 11.2–14.5)
WBC: 6 10*3/uL (ref 3.9–10.3)
lymph#: 2.1 10*3/uL (ref 0.9–3.3)

## 2012-01-28 LAB — FERRITIN: Ferritin: 156 ng/mL (ref 10–291)

## 2012-01-28 MED ORDER — ATORVASTATIN CALCIUM 40 MG PO TABS: 40.0000 mg | ORAL_TABLET | Freq: Every day | ORAL | Status: DC

## 2012-01-28 NOTE — Patient Instructions (Addendum)
Please contact us at [phone] if you have any questions or concerns.   Results for orders placed in visit on 01/28/12 (from the past 24 hour(s))  CBC WITH DIFFERENTIAL     Status: Abnormal   Collection Time   01/28/12  1:30 PM      Component Value Range   WBC 6.0  3.9 - 10.3 10e3/uL   NEUT# 3.0  1.5 - 6.5 10e3/uL   HGB 12.1  11.6 - 15.9 g/dL   HCT 35.5  34.8 - 46.6 %   Platelets 366  145 - 400 10e3/uL   MCV 94.6  79.5 - 101.0 fL   MCH 32.3  25.1 - 34.0 pg   MCHC 34.2  31.5 - 36.0 g/dL   RBC 3.75  3.70 - 5.45 10e6/uL   RDW 14.7 (*) 11.2 - 14.5 %   lymph# 2.1  0.9 - 3.3 10e3/uL   MONO# 0.7  0.1 - 0.9 10e3/uL   Eosinophils Absolute 0.2  0.0 - 0.5 10e3/uL   Basophils Absolute 0.1  0.0 - 0.1 10e3/uL   NEUT% 50.6  38.4 - 76.8 %   LYMPH% 34.4  14.0 - 49.7 %   MONO% 11.1  0.0 - 14.0 %   EOS% 2.6  0.0 - 7.0 %   BASO% 1.3  0.0 - 2.0 %   Narrative:    Items were attached to this order: [facility] ([facility])Performed At:  [facility]               [address]               [place], [place] [address]

## 2012-01-28 NOTE — Telephone Encounter (Signed)
appts made and printed for pt aom °

## 2012-01-28 NOTE — Progress Notes (Signed)
Patient ID: [name] [name], female   DOB: 08-Nov-1931, 79 y.o.   MRN: [id number] CSN: [id number]  CC: [name] [name]. Plotnikov, MD  [name] [name], M.D.  [name] [name]. [name] Cancel, MD, [facility]  Problem List: [name] is a 79 y.o. female with a problem list consisting of:  1. Anemia felt to be secondary to renal insufficiency. We have been following the patient since February 2009 for this problem. She  had bone marrow aspirate and biopsy on 05/14/2007 with negative findings and negative cytogenetics.  2. Mild renal insufficiency 3. Hypertension 4. Dyslipidemia 5. Peripheral vascular disease status post carotid endarterectomy on the right side in November 2008.  6. History of atrial fibrillation 7. GERD 8. Osteoporosis 9. Depression 10.History of pancreatitis in 1992.  11.Irritable bowel syndrome 12.Diverticulosis status post sigmoid resection in September 1995.  13.Status post GI bleeding, felt to be due to diverticulosis. 14. Bone heel spurs 15.Status post accidental fall on or about 06/01/2011 with persistent bilateral hip and back pain   Dr. Ralene Ok and I saw Ms. Ronicia [name] today for follow up of her anemia felt to be secondary to renal insufficiency. The patient was last seen by [name] on 07/23/2011.  It will be recalled the patient had a bone marrow aspirate and biopsy carried out on May 14, 2007. This was basically negative. The patient continues to do well in general. She lives in her own home by herself. She drives. She is a Research scientist (physical sciences) at [employer], every other weekend.   [name] denies any recent falls, but states that she still has bilateral hip and lower back pain since her fall on Mother's Day 2013.  It will be recalled that the patient fell and hit her head and had to go to the emergency room at [facility] facility off of [address].  She denies any other symptomatology including any unusual bleeding.   [name] required Aranesp on 07/23/2011 when her hemoglobin was 10.9, on  09/17/2011 when her hemoglobin was 10.8 and on 01/07/2012 when her hemoglobin was 10.0 over the past 6 months.   Past Medical History: Past Medical History  Diagnosis Date  . Unspecified essential hypertension     Dr [name] [name]  . Other and unspecified hyperlipidemia   . Unspecified disorder resulting from impaired renal function   . Diverticulosis of colon with hemorrhage   . Unspecified vitamin D deficiency   . Unspecified asthma   . Persistent disorder of initiating or maintaining sleep   . Lumbago   . Esophageal reflux   . Diverticulosis of colon (without mention of hemorrhage)   . Depressive disorder, not elsewhere classified   . Anxiety state, unspecified   . Anemia, unspecified   . Unspecified osteomyelitis, site unspecified     Surgical History: Past Surgical History  Procedure Date  . Bone marrow biopsy   . Status post right carotid enterectomy     Current Medications: Current Outpatient Prescriptions  Medication Sig Dispense Refill  . albuterol (PROAIR HFA) 108 (90 BASE) MCG/ACT inhaler Inhale 2 puffs into the lungs every 4 (four) hours as needed.        [name] amLODipine (NORVASC) 2.5 MG tablet Take 2.5 mg by mouth daily.      [name] aspirin 81 MG tablet Take 81 mg by mouth daily.        [name] atorvastatin (LIPITOR) 40 MG tablet Take 1 tablet (40 mg total) by mouth daily.  30 tablet  6  . chlorthalidone (HYGROTON) 25 MG  tablet Take 12.5 mg by mouth daily.       . Cholecalciferol (EQL VITAMIN D3) 1000 UNITS tablet Take 1,000 Units by mouth daily.        . darbepoetin (ARANESP, ALB FREE, SURECLICK) 123XX123 [id number] SOLN Inject 100 mcg into the skin every 8 (eight) weeks.       . RABEprazole (ACIPHEX) 20 MG tablet Take 20 mg by mouth as needed.        . valsartan (DIOVAN) 160 MG tablet Take 160 mg by mouth daily.      . vitamin B-12 (CYANOCOBALAMIN) 1000 MCG tablet Take 1,000 mcg by mouth daily.          Allergies: Allergies  Allergen Reactions  . Codeine Sulfate   . Propoxyphene  Napsylate   . Sulfacetamide Sodium-Sulfur     Family History: Family History  Problem Relation Age of Onset  . Hypertension Other   . COPD Mother   . Cancer Father     lymphoma    Social History: History  Substance Use Topics  . Smoking status: Never Smoker   . Smokeless tobacco: Not on file  . Alcohol Use: 0.0 oz/week    1-2 Shots of liquor per week    Review of Systems: 10 Point review of systems was completed and is negative except as noted above.   Physical Exam:   Blood pressure 152/68, pulse 75, temperature 97.4 F (36.3 C), temperature source Oral, resp. rate 18, height 5\' 3"  (1.6 m), weight 180 lb 8 oz (81.874 kg).  General appearance: Alert, cooperative, well nourished and no apparent distress Head: Normocephalic, without obvious abnormality, atraumatic Eyes: Arcus senilis, PERRLA, EOMI Nose: Nares, septum and mucosa are normal, no drainage or sinus tenderness Neck: No adenopathy, supple, symmetrical, trachea midline, thyroid not enlarged, no tenderness, well healed surgical scar Resp: Clear to auscultation bilaterally Cardio: Regular rate and rhythm, S1, S2 normal, no murmur, click, rub or gallop GI: Soft, distended, diffuse tenderness, normoactive bowel sounds, no organomegaly Extremities: Extremities normal, atraumatic, no cyanosis or edema Skin: Skin color, texture, turgor normal, no rashes or lesions, bilateral UE petechiae Lymph nodes: Cervical, supraclavicular, and axillary nodes normal Neurologic: Grossly normal   Laboratory Data: Results for orders placed in visit on 01/28/12 (from the past 48 hour(s))  CBC WITH DIFFERENTIAL     Status: Abnormal   Collection Time   01/28/12  1:30 PM      Component Value Range Comment   WBC 6.0  3.9 - 10.3 10e3/uL    NEUT# 3.0  1.5 - 6.5 10e3/uL    HGB 12.1  11.6 - 15.9 g/dL    HCT 35.5  34.8 - 46.6 %    Platelets 366  145 - 400 10e3/uL    MCV 94.6  79.5 - 101.0 fL    MCH 32.3  25.1 - 34.0 pg    MCHC 34.2  31.5  - 36.0 g/dL    RBC 3.75  3.70 - 5.45 10e6/uL    RDW 14.7 (*) 11.2 - 14.5 %    lymph# 2.1  0.9 - 3.3 10e3/uL    MONO# 0.7  0.1 - 0.9 10e3/uL    Eosinophils Absolute 0.2  0.0 - 0.5 10e3/uL    Basophils Absolute 0.1  0.0 - 0.1 10e3/uL    NEUT% 50.6  38.4 - 76.8 %    LYMPH% 34.4  14.0 - 49.7 %    MONO% 11.1  0.0 - 14.0 %    EOS% 2.6  0.0 -  7.0 %    BASO% 1.3  0.0 - 2.0 %   FERRITIN     Status: Normal   Collection Time   01/28/12  1:30 PM      Component Value Range Comment   Ferritin 156  10 - 291 ng/mL      Imaging Studies: 1. Digital screening mammogram on 11/19/2010 was negative.  2. Chest x-ray, 2 view, from 05/30/2011 was negative.  3. CT scan of the head without IV contrast on 06/01/2011 showed mild generalized atrophy and white matter disease felt to reflect chronic microvascular ischemia. There were no significant changes when compared with the CT scan carried out on 04/11/2008. There were no acute abnormalities. There was bilateral maxillary sinusitis and some mucosal thickening within the paranasal sinuses  without fluid levels.  4. Chest x-ray, 2 view, from 06/01/2011 was negative. 5.  Digital screening mammogram on 01/02/2012 showed no mammographic evidence of malignancy.    Impression/Plan: Ms. Bruhl continues to do quite well. She seems to require an Aranesp injection every other month.  With her hemoglobin at 12.1 today she did not require an Aranesp injection.  We will continue to check CBCs every 8 weeks and give Aranesp 200 mcg subcu whenever the hemoglobin is less than 11. We will plan to see Mrs. Bosso again in about 6 months (07/15/2011) at which time we will check CBC, chemistries and iron studies.  Ms. Tsuji stated that she does not have a Nephrologist and Dr. Alain Marion is following her renal functioning.  I encouraged her to drink more water.  Ms. Fiegel was also encouraged to contact us in the interim if she has any questions or concerns.    Ailene Ards,  NP-C 01/28/2012, 2:13 PM

## 2012-01-28 NOTE — Telephone Encounter (Signed)
PT AWARE  NEW  MED PHONED IN  TO [place] .Adonis Housekeeper

## 2012-02-07 ENCOUNTER — Telehealth: Payer: Self-pay | Admitting: Oncology

## 2012-02-07 NOTE — Telephone Encounter (Signed)
Returned pt's call and s/w pt confirming appts for 2/4 , 4/7 and 6/9. Pt understands she does not have an appt 2/11.

## 2012-02-10 ENCOUNTER — Telehealth: Payer: Self-pay | Admitting: Oncology

## 2012-02-10 NOTE — Telephone Encounter (Signed)
pt lvm concerned about lab and inj appt....pt thought they are supposed to be every 2 mths...gv info to desk nurse Juliann Pulse she stated that she will look into it and call the pt.

## 2012-02-25 ENCOUNTER — Other Ambulatory Visit (HOSPITAL_BASED_OUTPATIENT_CLINIC_OR_DEPARTMENT_OTHER): Payer: Medicare Other | Admitting: Lab

## 2012-02-25 ENCOUNTER — Ambulatory Visit (HOSPITAL_BASED_OUTPATIENT_CLINIC_OR_DEPARTMENT_OTHER): Payer: Medicare Other

## 2012-02-25 VITALS — BP 127/68 | HR 79 | Temp 97.4°F

## 2012-02-25 DIAGNOSIS — N289 Disorder of kidney and ureter, unspecified: Secondary | ICD-10-CM

## 2012-02-25 DIAGNOSIS — N189 Chronic kidney disease, unspecified: Secondary | ICD-10-CM

## 2012-02-25 DIAGNOSIS — D649 Anemia, unspecified: Secondary | ICD-10-CM | POA: Diagnosis not present

## 2012-02-25 LAB — CBC WITH DIFFERENTIAL/PLATELET
Eosinophils Absolute: 0.2 10*3/uL (ref 0.0–0.5)
LYMPH%: 29.3 % (ref 14.0–49.7)
MCHC: 33.2 g/dL (ref 31.5–36.0)
MCV: 93.5 fL (ref 79.5–101.0)
MONO%: 9.7 % (ref 0.0–14.0)
NEUT#: 3.1 10*3/uL (ref 1.5–6.5)
Platelets: 319 10*3/uL (ref 145–400)
RBC: 3.42 10*6/uL — ABNORMAL LOW (ref 3.70–5.45)

## 2012-02-25 MED ORDER — DARBEPOETIN ALFA-POLYSORBATE 200 MCG/0.4ML IJ SOLN
200.0000 ug | Freq: Once | INTRAMUSCULAR | Status: AC
  Administered 2012-02-25: 200 ug via SUBCUTANEOUS
  Filled 2012-02-25: qty 0.4

## 2012-02-28 DIAGNOSIS — L570 Actinic keratosis: Secondary | ICD-10-CM | POA: Diagnosis not present

## 2012-02-28 DIAGNOSIS — L819 Disorder of pigmentation, unspecified: Secondary | ICD-10-CM | POA: Diagnosis not present

## 2012-02-28 DIAGNOSIS — Z85828 Personal history of other malignant neoplasm of skin: Secondary | ICD-10-CM | POA: Diagnosis not present

## 2012-03-03 ENCOUNTER — Ambulatory Visit: Payer: Self-pay

## 2012-03-03 ENCOUNTER — Other Ambulatory Visit: Payer: Self-pay | Admitting: Lab

## 2012-03-16 DIAGNOSIS — L570 Actinic keratosis: Secondary | ICD-10-CM | POA: Diagnosis not present

## 2012-03-31 DIAGNOSIS — G2581 Restless legs syndrome: Secondary | ICD-10-CM | POA: Diagnosis not present

## 2012-03-31 DIAGNOSIS — G3184 Mild cognitive impairment, so stated: Secondary | ICD-10-CM | POA: Diagnosis not present

## 2012-03-31 DIAGNOSIS — F341 Dysthymic disorder: Secondary | ICD-10-CM | POA: Diagnosis not present

## 2012-04-27 ENCOUNTER — Other Ambulatory Visit (HOSPITAL_BASED_OUTPATIENT_CLINIC_OR_DEPARTMENT_OTHER): Payer: Medicare Other | Admitting: Lab

## 2012-04-27 ENCOUNTER — Ambulatory Visit: Payer: Medicare Other

## 2012-04-27 DIAGNOSIS — N189 Chronic kidney disease, unspecified: Secondary | ICD-10-CM

## 2012-04-27 DIAGNOSIS — D649 Anemia, unspecified: Secondary | ICD-10-CM

## 2012-04-27 DIAGNOSIS — D631 Anemia in chronic kidney disease: Secondary | ICD-10-CM

## 2012-04-27 DIAGNOSIS — L570 Actinic keratosis: Secondary | ICD-10-CM | POA: Diagnosis not present

## 2012-04-27 LAB — CBC WITH DIFFERENTIAL/PLATELET
BASO%: 1.2 % (ref 0.0–2.0)
EOS%: 3.9 % (ref 0.0–7.0)
LYMPH%: 32.4 % (ref 14.0–49.7)
MCHC: 33.4 g/dL (ref 31.5–36.0)
MONO#: 0.6 10*3/uL (ref 0.1–0.9)
MONO%: 10.8 % (ref 0.0–14.0)
Platelets: 339 10*3/uL (ref 145–400)
RBC: 3.71 10*6/uL (ref 3.70–5.45)
WBC: 5.5 10*3/uL (ref 3.9–10.3)

## 2012-04-27 NOTE — Progress Notes (Signed)
Patient here for lab and possible injection.   HGB >11.0    Aranesp held r/t parameters not meet.

## 2012-06-09 DIAGNOSIS — G3184 Mild cognitive impairment, so stated: Secondary | ICD-10-CM | POA: Diagnosis not present

## 2012-06-09 DIAGNOSIS — E538 Deficiency of other specified B group vitamins: Secondary | ICD-10-CM | POA: Diagnosis not present

## 2012-06-09 DIAGNOSIS — F411 Generalized anxiety disorder: Secondary | ICD-10-CM | POA: Diagnosis not present

## 2012-06-09 DIAGNOSIS — G589 Mononeuropathy, unspecified: Secondary | ICD-10-CM | POA: Diagnosis not present

## 2012-06-09 DIAGNOSIS — E559 Vitamin D deficiency, unspecified: Secondary | ICD-10-CM | POA: Diagnosis not present

## 2012-06-09 DIAGNOSIS — G2581 Restless legs syndrome: Secondary | ICD-10-CM | POA: Diagnosis not present

## 2012-06-24 DIAGNOSIS — D692 Other nonthrombocytopenic purpura: Secondary | ICD-10-CM | POA: Diagnosis not present

## 2012-06-24 DIAGNOSIS — L820 Inflamed seborrheic keratosis: Secondary | ICD-10-CM | POA: Diagnosis not present

## 2012-06-24 DIAGNOSIS — L570 Actinic keratosis: Secondary | ICD-10-CM | POA: Diagnosis not present

## 2012-06-24 DIAGNOSIS — L821 Other seborrheic keratosis: Secondary | ICD-10-CM | POA: Diagnosis not present

## 2012-06-24 DIAGNOSIS — Z85828 Personal history of other malignant neoplasm of skin: Secondary | ICD-10-CM | POA: Diagnosis not present

## 2012-06-29 ENCOUNTER — Ambulatory Visit (HOSPITAL_BASED_OUTPATIENT_CLINIC_OR_DEPARTMENT_OTHER): Payer: Medicare Other | Admitting: Oncology

## 2012-06-29 ENCOUNTER — Encounter: Payer: Self-pay | Admitting: Oncology

## 2012-06-29 ENCOUNTER — Other Ambulatory Visit (HOSPITAL_BASED_OUTPATIENT_CLINIC_OR_DEPARTMENT_OTHER): Payer: Medicare Other | Admitting: Lab

## 2012-06-29 ENCOUNTER — Ambulatory Visit (HOSPITAL_BASED_OUTPATIENT_CLINIC_OR_DEPARTMENT_OTHER): Payer: Medicare Other

## 2012-06-29 VITALS — BP 144/68 | HR 75 | Temp 96.9°F | Resp 18 | Ht 63.0 in | Wt 181.3 lb

## 2012-06-29 DIAGNOSIS — N189 Chronic kidney disease, unspecified: Secondary | ICD-10-CM | POA: Diagnosis not present

## 2012-06-29 DIAGNOSIS — D649 Anemia, unspecified: Secondary | ICD-10-CM

## 2012-06-29 DIAGNOSIS — N289 Disorder of kidney and ureter, unspecified: Secondary | ICD-10-CM

## 2012-06-29 DIAGNOSIS — D631 Anemia in chronic kidney disease: Secondary | ICD-10-CM

## 2012-06-29 LAB — COMPREHENSIVE METABOLIC PANEL (CC13)
Alkaline Phosphatase: 94 U/L (ref 40–150)
BUN: 41.4 mg/dL — ABNORMAL HIGH (ref 7.0–26.0)
Glucose: 97 mg/dl (ref 70–99)
Total Bilirubin: 0.6 mg/dL (ref 0.20–1.20)

## 2012-06-29 LAB — CBC WITH DIFFERENTIAL/PLATELET
BASO%: 1.1 % (ref 0.0–2.0)
MCHC: 35 g/dL (ref 31.5–36.0)
MONO#: 0.7 10*3/uL (ref 0.1–0.9)
RBC: 3.08 10*6/uL — ABNORMAL LOW (ref 3.70–5.45)
RDW: 14.1 % (ref 11.2–14.5)
WBC: 6.4 10*3/uL (ref 3.9–10.3)
lymph#: 1.6 10*3/uL (ref 0.9–3.3)

## 2012-06-29 MED ORDER — DARBEPOETIN ALFA-POLYSORBATE 500 MCG/ML IJ SOLN
200.0000 ug | Freq: Once | INTRAMUSCULAR | Status: AC
  Administered 2012-06-29: 200 ug via SUBCUTANEOUS
  Filled 2012-06-29: qty 1

## 2012-06-29 NOTE — Progress Notes (Signed)
This office note has been dictated.  [id number]

## 2012-06-29 NOTE — Progress Notes (Signed)
CC:   [name] [name]. Plotnikov, MD [name], M.D. [name]. [name], MD, [facility]  PROBLEM LIST:  1. Anemia felt to be secondary to renal insufficiency. We have been  following the patient since February 2009 for this problem. She  had bone marrow aspirate and biopsy on 05/14/2007 with negative  findings and negative cytogenetics.  2. Mild renal insufficiency.  3. Hypertension.  4. Dyslipidemia.  5. Peripheral vascular disease status post carotid endarterectomy on  the right side in November 2008.  6. History of atrial fibrillation.  7. GERD.  8. Osteoporosis.  9. Depression.  10. History of pancreatitis in 1992.  11. Irritable bowel syndrome.  12. Diverticulosis status post sigmoid resection in September 1995.  13. Status post GI bleeding, felt to be due to diverticulosis.   MEDICATIONS:  Reviewed and recorded. Current Outpatient Prescriptions  Medication Sig Dispense Refill  . albuterol (PROAIR HFA) 108 (90 BASE) MCG/ACT inhaler Inhale 2 puffs into the lungs every 4 (four) hours as needed.        [name] amLODipine (NORVASC) 2.5 MG tablet Take 2.5 mg by mouth daily.      [name] aspirin 81 MG tablet Take 81 mg by mouth daily.        [name] atorvastatin (LIPITOR) 40 MG tablet Take 1 tablet (40 mg total) by mouth daily.  30 tablet  6  . chlorthalidone (HYGROTON) 25 MG tablet Take 12.5 mg by mouth daily.       . Cholecalciferol (EQL VITAMIN D3) 1000 UNITS tablet Take 1,000 Units by mouth daily.        . darbepoetin (ARANESP, ALB FREE, SURECLICK) 123XX123 [id number] SOLN Inject 100 mcg into the skin every 8 (eight) weeks.       . RABEprazole (ACIPHEX) 20 MG tablet Take 20 mg by mouth as needed.        . valsartan (DIOVAN) 160 MG tablet Take 160 mg by mouth daily.      . vitamin B-12 (CYANOCOBALAMIN) 1000 MCG tablet Take 1,000 mcg by mouth daily.         No current facility-administered medications for this visit.    TREATMENT PROGRAM:  -Aranesp 200 mcg subcu every 2 months for a hemoglobin less than  11.  SMOKING HISTORY:  The patient has never smoked cigarettes.   HISTORY:  I am seeing [name] [name] today for followup of her anemia felt to be secondary to renal insufficiency.  The patient was last seen by [name] on 01/28/2012 and prior to that on 07/23/2011.  She has been coming in for CBC every 2 months and having Aranesp whenever the hemoglobin is less than 11.  She feels that the injections are helping her maintain her energy.  Every other weekend she works at [employer].  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 The patient is without complaints today.  She has done fairly well over the past 6 months.  She denies any recent falls.  There have been no recent medical issues.  PHYSICAL EXAMINATION:  There is little change.  Weight is 181 pounds 4.8 ounces.  Height 5 feet 3 inches.  Body surface area 1.91 sq m.  Blood pressure 144/68.  Other vital signs are normal.  There is no scleral icterus.  Mouth and pharynx are benign.  The patient has had a couple of skin cancers removed from the right face.  No peripheral adenopathy palpable.  Heart and lungs were normal.  At one point  I thought I could hear a systolic ejection murmur but this was not a constant finding. Abdomen is obese, nontender, with no organomegaly or masses palpable. Extremities:  No peripheral edema or clubbing.  Neurologic exam is grossly normal.  LABORATORY DATA:  Today, white count 6.4, ANC 3.8, hemoglobin 9.7, hematocrit 27.7, platelets 392,000.  Chemistries today notable for a BUN of 41, creatinine 2.0.  BUN and creatinine may be a little higher than they have been in the past.  On 02/05/2011 creatinine was 1.60.  On 02/06/2010 creatinine was 1.50.  On 01/28/2012 ferritin was 156.  IMAGING STUDIES:  1. Digital screening mammogram on 11/19/2010 was negative.  2. Chest x-ray, 2 view, from 05/30/2011 was negative.  3. CT scan of the head without IV contrast on 06/01/2011 showed  mild  generalized atrophy and white matter disease felt to reflect  chronic microvascular ischemia. There were no significant changes  when compared with the CT scan carried out on 04/11/2008. There  were no acute abnormalities. There was bilateral maxillary  sinusitis and some mucosal thickening within the paranasal sinuses  without fluid levels.  4. Chest x-ray, 2 view, from 06/01/2011 was negative. 5. Digital bilateral screening mammogram with digital breast tomosynthesis on 01/01/2012 was negative.   IMPRESSION AND PLAN:  [name] [name] continues to do well on the current treatment program.  We will give her Aranesp 200 mcg today for her hemoglobin of 9.7.  She did not require Aranesp on 04/27/2012 due to a hemoglobin of 11.3.  On 02/25/2012 Aranesp 200 mcg of was given for a hemoglobin of 10.6.  On 01/28/2012 hemoglobin was 12.1.  Of note is the gradual increase in the BUN and creatinine.  The patient was given a copy of her labs and I brought this to her attention.  I suggested she discuss this with Dr. Alain Marion.  She may need renal consultation as it does appear that her BUN and creatinine are drifting upwards.  We will continue to check CBCs every 2 months and give Aranesp whenever the hemoglobin is less than 11.  We will plan to see Ms. Binney again in 6 months, at which time we will check CBC, chemistries and a ferritin.    ______________________________ Jeanie Cooks, M.D. DSM/MEDQ  D:  06/29/2012  T:  06/29/2012  Job:  [id number]

## 2012-06-30 ENCOUNTER — Telehealth: Payer: Self-pay | Admitting: Oncology

## 2012-06-30 LAB — IRON AND TIBC
%SAT: 36 % (ref 20–55)
Iron: 109 ug/dL (ref 42–145)
TIBC: 301 ug/dL (ref 250–470)

## 2012-07-01 ENCOUNTER — Encounter: Payer: Self-pay | Admitting: Internal Medicine

## 2012-07-01 ENCOUNTER — Ambulatory Visit (INDEPENDENT_AMBULATORY_CARE_PROVIDER_SITE_OTHER)
Admission: RE | Admit: 2012-07-01 | Discharge: 2012-07-01 | Disposition: A | Discharge status: Home / Self Care | Payer: Medicare Other | Source: Ambulatory Visit | Attending: Internal Medicine | Admitting: Internal Medicine

## 2012-07-01 ENCOUNTER — Ambulatory Visit (INDEPENDENT_AMBULATORY_CARE_PROVIDER_SITE_OTHER): Payer: Medicare Other | Admitting: Internal Medicine

## 2012-07-01 VITALS — BP 140/70 | HR 76 | Temp 98.9°F | Resp 16 | Wt 181.0 lb

## 2012-07-01 DIAGNOSIS — M545 Low back pain, unspecified: Secondary | ICD-10-CM

## 2012-07-01 DIAGNOSIS — R252 Cramp and spasm: Secondary | ICD-10-CM | POA: Diagnosis not present

## 2012-07-01 DIAGNOSIS — I10 Essential (primary) hypertension: Secondary | ICD-10-CM | POA: Diagnosis not present

## 2012-07-01 DIAGNOSIS — Z23 Encounter for immunization: Secondary | ICD-10-CM

## 2012-07-01 DIAGNOSIS — M5137 Other intervertebral disc degeneration, lumbosacral region: Secondary | ICD-10-CM | POA: Diagnosis not present

## 2012-07-01 DIAGNOSIS — N259 Disorder resulting from impaired renal tubular function, unspecified: Secondary | ICD-10-CM

## 2012-07-01 DIAGNOSIS — I6529 Occlusion and stenosis of unspecified carotid artery: Secondary | ICD-10-CM

## 2012-07-01 DIAGNOSIS — M4310 Spondylolisthesis, site unspecified: Secondary | ICD-10-CM | POA: Diagnosis not present

## 2012-07-01 DIAGNOSIS — R109 Unspecified abdominal pain: Secondary | ICD-10-CM

## 2012-07-01 NOTE — Assessment & Plan Note (Signed)
Hold lipitor x 1 wk, then take 1 qod

## 2012-07-01 NOTE — Assessment & Plan Note (Signed)
Continue with current prescription therapy as reflected on the Med list.  

## 2012-07-01 NOTE — Assessment & Plan Note (Signed)
[name] Labs Hydration

## 2012-07-01 NOTE — Assessment & Plan Note (Signed)
UA LS xray

## 2012-07-01 NOTE — Patient Instructions (Signed)
Stop Lipitor for 1 week, then start to take Lipitor every other day to diminish CRAMPS Drink more water

## 2012-07-01 NOTE — Progress Notes (Signed)
  Subjective:     HPI  The patient presents for a follow-up of  chronic hypertension, chronic dyslipidemia, anemia, CRI, vit D def  Wt Readings from Last 3 Encounters:  07/01/12 181 lb (82.101 kg)  06/29/12 181 lb 4.8 oz (82.237 kg)  01/28/12 180 lb 8 oz (81.874 kg)   BP Readings from Last 3 Encounters:  07/01/12 140/70  06/29/12 144/68  02/25/12 127/68     Review of Systems  Constitutional: Negative for chills, activity change, appetite change, fatigue and unexpected weight change.  HENT: Negative for congestion, mouth sores and sinus pressure.   Eyes: Negative for visual disturbance.  Respiratory: Negative for cough and chest tightness.   Cardiovascular: Positive for leg swelling.  Gastrointestinal: Negative for nausea and abdominal pain.  Genitourinary: Negative for frequency, difficulty urinating and vaginal pain.  Musculoskeletal: Negative for back pain and gait problem.       Cramps  Skin: Negative for pallor and rash.  Neurological: Negative for dizziness, tremors, weakness, numbness and headaches.  Psychiatric/Behavioral: Positive for sleep disturbance. Negative for suicidal ideas and confusion. The patient is nervous/anxious.        Objective:   Physical Exam  Constitutional: She is oriented to person, place, and time. She appears well-developed. No distress.  Obese   HENT:  Head: Normocephalic.  Right Ear: External ear normal.  Left Ear: External ear normal.  Nose: Nose normal.  Mouth/Throat: Oropharynx is clear and moist.  Eyes: Conjunctivae are normal. Pupils are equal, round, and reactive to light. Right eye exhibits no discharge. Left eye exhibits no discharge.  Neck: Normal range of motion. Neck supple. No JVD present. No tracheal deviation present. No thyromegaly present.  Cardiovascular: Normal rate, regular rhythm and normal heart sounds.   Pulmonary/Chest: No stridor. No respiratory distress. She has no wheezes.  Abdominal: Soft. Bowel sounds are  normal. She exhibits no distension and no mass. There is no tenderness. There is no rebound and no guarding.  Musculoskeletal: She exhibits tenderness (LS spine, hips). She exhibits no edema.  Lymphadenopathy:    She has no cervical adenopathy.  Neurological: She is alert and oriented to person, place, and time. She displays normal reflexes. No cranial nerve deficit. She exhibits normal muscle tone. Coordination normal.  Skin: No rash noted. No erythema.  Psychiatric: She has a normal mood and affect. Her behavior is normal. Judgment and thought content normal.     Lab Results  Component Value Date   WBC 6.4 06/29/2012   HGB 9.7* 06/29/2012   HCT 27.7* 06/29/2012   PLT 392 06/29/2012   GLUCOSE 97 06/29/2012   CHOL 158 06/01/2010   TRIG 53.0 06/01/2010   HDL 75.20 06/01/2010   LDLCALC 72 06/01/2010   ALT 16 06/29/2012   AST 25 06/29/2012   NA 138 06/29/2012   K 4.4 06/29/2012   CL 105 06/29/2012   CREATININE 2.0* 06/29/2012   BUN 41.4* 06/29/2012   CO2 25 06/29/2012   TSH 2.86 06/01/2010   INR 1.1 09/08/2008        Assessment & Plan:

## 2012-07-07 ENCOUNTER — Ambulatory Visit
Admission: RE | Admit: 2012-07-07 | Discharge: 2012-07-07 | Disposition: A | Discharge status: Home / Self Care | Payer: Medicare Other | Source: Ambulatory Visit | Attending: Internal Medicine | Admitting: Internal Medicine

## 2012-07-07 DIAGNOSIS — R1900 Intra-abdominal and pelvic swelling, mass and lump, unspecified site: Secondary | ICD-10-CM | POA: Diagnosis not present

## 2012-07-08 ENCOUNTER — Telehealth: Payer: Self-pay | Admitting: *Deleted

## 2012-07-08 NOTE — Telephone Encounter (Signed)
Message copied by Julieta Bellini on Wed Jul 08, 2012 10:44 AM ------      Message from: Cassandria Anger      Created: Wed Jul 08, 2012  7:43 AM       Erline Levine, please, inform patient that her abd [name] was normal       Thx        ------

## 2012-07-08 NOTE — Telephone Encounter (Signed)
Patient requested copy of ultrasound results and also a copy her back x-ray. Patient said that she would come by the office and pick up when they are ready.

## 2012-07-08 NOTE — Telephone Encounter (Signed)
Copies upfront for p/u.

## 2012-08-24 ENCOUNTER — Other Ambulatory Visit (HOSPITAL_BASED_OUTPATIENT_CLINIC_OR_DEPARTMENT_OTHER): Payer: Medicare Other | Admitting: Lab

## 2012-08-24 ENCOUNTER — Ambulatory Visit: Payer: Medicare Other

## 2012-08-24 DIAGNOSIS — N189 Chronic kidney disease, unspecified: Secondary | ICD-10-CM | POA: Diagnosis not present

## 2012-08-24 DIAGNOSIS — D649 Anemia, unspecified: Secondary | ICD-10-CM | POA: Diagnosis not present

## 2012-08-24 DIAGNOSIS — D631 Anemia in chronic kidney disease: Secondary | ICD-10-CM

## 2012-08-24 LAB — CBC WITH DIFFERENTIAL/PLATELET
Basophils Absolute: 0.1 10*3/uL (ref 0.0–0.1)
EOS%: 3.6 % (ref 0.0–7.0)
Eosinophils Absolute: 0.2 10*3/uL (ref 0.0–0.5)
HGB: 11 g/dL — ABNORMAL LOW (ref 11.6–15.9)
MCH: 31.8 pg (ref 25.1–34.0)
NEUT#: 3.4 10*3/uL (ref 1.5–6.5)
RDW: 12.9 % (ref 11.2–14.5)
lymph#: 2 10*3/uL (ref 0.9–3.3)

## 2012-08-24 MED ORDER — DARBEPOETIN ALFA-POLYSORBATE 500 MCG/ML IJ SOLN: 200.0000 ug | Freq: Once | INTRAMUSCULAR | Status: DC

## 2012-08-25 ENCOUNTER — Encounter (HOSPITAL_COMMUNITY): Payer: Self-pay | Admitting: *Deleted

## 2012-08-25 ENCOUNTER — Emergency Department (HOSPITAL_COMMUNITY)
Admission: EM | Admit: 2012-08-25 | Discharge: 2012-08-25 | Disposition: A | Discharge status: Home / Self Care | Payer: Medicare Other | Attending: Emergency Medicine | Admitting: Emergency Medicine

## 2012-08-25 DIAGNOSIS — Z7982 Long term (current) use of aspirin: Secondary | ICD-10-CM | POA: Insufficient documentation

## 2012-08-25 DIAGNOSIS — Z8659 Personal history of other mental and behavioral disorders: Secondary | ICD-10-CM | POA: Insufficient documentation

## 2012-08-25 DIAGNOSIS — T63461A Toxic effect of venom of wasps, accidental (unintentional), initial encounter: Secondary | ICD-10-CM | POA: Insufficient documentation

## 2012-08-25 DIAGNOSIS — L538 Other specified erythematous conditions: Secondary | ICD-10-CM | POA: Diagnosis not present

## 2012-08-25 DIAGNOSIS — R1900 Intra-abdominal and pelvic swelling, mass and lump, unspecified site: Secondary | ICD-10-CM | POA: Diagnosis not present

## 2012-08-25 DIAGNOSIS — Z8719 Personal history of other diseases of the digestive system: Secondary | ICD-10-CM | POA: Insufficient documentation

## 2012-08-25 DIAGNOSIS — E559 Vitamin D deficiency, unspecified: Secondary | ICD-10-CM | POA: Diagnosis not present

## 2012-08-25 DIAGNOSIS — Y929 Unspecified place or not applicable: Secondary | ICD-10-CM | POA: Insufficient documentation

## 2012-08-25 DIAGNOSIS — M7989 Other specified soft tissue disorders: Secondary | ICD-10-CM | POA: Diagnosis not present

## 2012-08-25 DIAGNOSIS — T6391XA Toxic effect of contact with unspecified venomous animal, accidental (unintentional), initial encounter: Secondary | ICD-10-CM | POA: Diagnosis not present

## 2012-08-25 DIAGNOSIS — J45909 Unspecified asthma, uncomplicated: Secondary | ICD-10-CM | POA: Insufficient documentation

## 2012-08-25 DIAGNOSIS — D649 Anemia, unspecified: Secondary | ICD-10-CM | POA: Diagnosis not present

## 2012-08-25 DIAGNOSIS — Z79899 Other long term (current) drug therapy: Secondary | ICD-10-CM | POA: Diagnosis not present

## 2012-08-25 DIAGNOSIS — E785 Hyperlipidemia, unspecified: Secondary | ICD-10-CM | POA: Insufficient documentation

## 2012-08-25 DIAGNOSIS — I10 Essential (primary) hypertension: Secondary | ICD-10-CM | POA: Diagnosis not present

## 2012-08-25 DIAGNOSIS — Z8739 Personal history of other diseases of the musculoskeletal system and connective tissue: Secondary | ICD-10-CM | POA: Diagnosis not present

## 2012-08-25 DIAGNOSIS — Y939 Activity, unspecified: Secondary | ICD-10-CM | POA: Insufficient documentation

## 2012-08-25 DIAGNOSIS — Z87448 Personal history of other diseases of urinary system: Secondary | ICD-10-CM | POA: Diagnosis not present

## 2012-08-25 MED ORDER — RANITIDINE HCL 150 MG PO TABS: 150.0000 mg | ORAL_TABLET | Freq: Two times a day (BID) | ORAL | Status: DC

## 2012-08-25 MED ORDER — DIPHENHYDRAMINE HCL 50 MG/ML IJ SOLN
25.0000 mg | Freq: Once | INTRAMUSCULAR | Status: AC
  Administered 2012-08-25: 25 mg via INTRAVENOUS
  Filled 2012-08-25: qty 1

## 2012-08-25 MED ORDER — PREDNISONE 20 MG PO TABS
60.0000 mg | ORAL_TABLET | Freq: Every day | ORAL | Status: DC
  Filled 2012-08-25: qty 3

## 2012-08-25 MED ORDER — PREDNISONE 20 MG PO TABS
60.0000 mg | ORAL_TABLET | Freq: Once | ORAL | Status: AC
  Administered 2012-08-25: 60 mg via ORAL

## 2012-08-25 MED ORDER — DIPHENHYDRAMINE HCL 25 MG PO CAPS: 25.0000 mg | ORAL_CAPSULE | Freq: Four times a day (QID) | ORAL | Status: DC

## 2012-08-25 MED ORDER — FAMOTIDINE IN NACL 20-0.9 MG/50ML-% IV SOLN
20.0000 mg | Freq: Once | INTRAVENOUS | Status: AC
  Administered 2012-08-25: 20 mg via INTRAVENOUS
  Filled 2012-08-25: qty 50

## 2012-08-25 MED ORDER — PREDNISONE 10 MG PO TABS: 60.0000 mg | ORAL_TABLET | Freq: Every day | ORAL | Status: DC

## 2012-08-25 NOTE — ED Notes (Signed)
Pt states was stung atleast 5 times by wasps, pt states she is allergic, took 25mg  of benadryl, no epi pen, pt got stung on L arm, L groin area and L side of back, whelps and redness noted where pt was stung. Pt states she feels short of breath, does have a hx of asthma.

## 2012-08-25 NOTE — ED Provider Notes (Signed)
CSN: [id number]     Arrival date & time 08/25/12  1347 History     First MD Initiated Contact with Patient 08/25/12 1401     Chief Complaint  Patient presents with  . Insect Bite  . Allergic Reaction   (Consider location/radiation/quality/duration/timing/severity/associated sxs/prior Treatment) Patient is a 79 y.o. female presenting with animal bite.  Animal Bite Contact animal:  Insect (yellow jacket) Animal bite location: left hand, left thigh, left flank. Time since incident:  1 hour Pain details:    Quality:  Stinging   Severity:  Severe   Timing:  Constant   Progression:  Worsening Incident location: garden. Relieved by:  Nothing Ineffective treatments: benadryl. Associated symptoms: no fever   Associated symptoms comment:  No shortness of breath, no nausea or vomiting, no dizziness or lightheadedness, new angioedema or throat swelling.  Localized swelling sting sites, no rash.   Past Medical History  Diagnosis Date  . Unspecified essential hypertension     Dr [name]  . Other and unspecified hyperlipidemia   . Unspecified disorder resulting from impaired renal function   . Diverticulosis of colon with hemorrhage   . Unspecified vitamin D deficiency   . Unspecified asthma(493.90)   . Persistent disorder of initiating or maintaining sleep   . Lumbago   . Esophageal reflux   . Diverticulosis of colon (without mention of hemorrhage)   . Depressive disorder, not elsewhere classified   . Anxiety state, unspecified   . Anemia, unspecified   . Unspecified osteomyelitis, site unspecified    Past Surgical History  Procedure Laterality Date  . Bone marrow biopsy    . Status post right carotid enterectomy     Family History  Problem Relation Age of Onset  . Hypertension Other   . COPD Mother   . Cancer Father     lymphoma   History  Substance Use Topics  . Smoking status: Never Smoker   . Smokeless tobacco: Never Used  . Alcohol Use: 0.0 oz/week    1-2 Shots of  liquor per week   OB History   Grav Para Term Preterm Abortions TAB SAB Ect Mult Living                 Review of Systems  Constitutional: Negative for fever.  HENT: Negative for congestion.   Respiratory: Negative for cough and shortness of breath.   Cardiovascular: Negative for chest pain.  Gastrointestinal: Negative for nausea, vomiting, abdominal pain and diarrhea.  All other systems reviewed and are negative.    Allergies  Codeine sulfate; Propoxyphene napsylate; and Sulfacetamide sodium-sulfur  Home Medications   Current Outpatient Rx  Name  Route  Sig  Dispense  Refill  . albuterol (PROAIR HFA) 108 (90 BASE) MCG/ACT inhaler   Inhalation   Inhale 2 puffs into the lungs every 4 (four) hours as needed.           [name] amLODipine (NORVASC) 2.5 MG tablet   Oral   Take 2.5 mg by mouth daily.         [name] aspirin 81 MG tablet   Oral   Take 81 mg by mouth daily.           [name] atorvastatin (LIPITOR) 40 MG tablet   Oral   Take 1 tablet (40 mg total) by mouth daily.   30 tablet   6   . chlorthalidone (HYGROTON) 25 MG tablet   Oral   Take 12.5 mg by mouth daily.          [name]  Cholecalciferol (EQL VITAMIN D3) 1000 UNITS tablet   Oral   Take 1,000 Units by mouth daily.           . darbepoetin (ARANESP, ALB FREE, SURECLICK) 123XX123 [id number] SOLN   Subcutaneous   Inject 100 mcg into the skin every 8 (eight) weeks.          . RABEprazole (ACIPHEX) 20 MG tablet   Oral   Take 20 mg by mouth as needed.           . valsartan (DIOVAN) 160 MG tablet   Oral   Take 160 mg by mouth daily.         . vitamin B-12 (CYANOCOBALAMIN) 1000 MCG tablet   Oral   Take 1,000 mcg by mouth daily.            BP 168/83  Pulse 84  Temp(Src) 97.6 F (36.4 C) (Oral)  Resp 20  Ht 5\' 3"  (1.6 m)  Wt 178 lb (80.74 kg)  BMI 31.54 kg/m2  SpO2 100% Physical Exam  Nursing note and vitals reviewed. Constitutional: She is oriented to person, place, and time. She appears  well-developed and well-nourished. No distress.  HENT:  Head: Normocephalic and atraumatic.  Mouth/Throat: Oropharynx is clear and moist.  Eyes: Conjunctivae are normal. Pupils are equal, round, and reactive to light. No scleral icterus.  Neck: Neck supple.  Cardiovascular: Normal rate, regular rhythm, normal heart sounds and intact distal pulses.   No murmur heard. Pulmonary/Chest: Effort normal and breath sounds normal. No stridor. No respiratory distress. She has no wheezes. She has no rales.  Abdominal: Soft. Bowel sounds are normal. She exhibits no distension. There is no tenderness. There is no rebound and no guarding.  Musculoskeletal: Normal range of motion.  Neurological: She is alert and oriented to person, place, and time.  Skin: Skin is warm and dry. No rash noted. Rash is not urticarial.  Small area of erythema and swelling on her left hand, left inner thigh, and left flank.  Psychiatric: She has a normal mood and affect. Her behavior is normal.    ED Course   Procedures (including critical care time)  Labs Reviewed - No data to display No results found. 1. Stung by yellow jacket, initial encounter     MDM  Bee stings with localized allergic reaction. No signs or symptoms of anaphylaxis. Given multiple sites, will give IV Benadryl, famotidine and PO prednisone.  Will monitor.    Observed in ED and did not develop signs of systemic allergic reaction.  Localized symptoms improved somewhat with IV benadryl, famotidine, and PO prednisone.  [place] home with return precautions.    Houston Siren, MD 08/26/12 [phone]

## 2012-09-17 DIAGNOSIS — L570 Actinic keratosis: Secondary | ICD-10-CM | POA: Diagnosis not present

## 2012-09-17 DIAGNOSIS — Z85828 Personal history of other malignant neoplasm of skin: Secondary | ICD-10-CM | POA: Diagnosis not present

## 2012-09-23 ENCOUNTER — Encounter: Payer: Self-pay | Admitting: Cardiovascular Disease

## 2012-10-01 ENCOUNTER — Other Ambulatory Visit (INDEPENDENT_AMBULATORY_CARE_PROVIDER_SITE_OTHER): Payer: Medicare Other

## 2012-10-01 ENCOUNTER — Ambulatory Visit: Payer: Self-pay | Admitting: Internal Medicine

## 2012-10-01 DIAGNOSIS — M545 Low back pain: Secondary | ICD-10-CM

## 2012-10-01 DIAGNOSIS — I10 Essential (primary) hypertension: Secondary | ICD-10-CM | POA: Diagnosis not present

## 2012-10-01 DIAGNOSIS — R252 Cramp and spasm: Secondary | ICD-10-CM | POA: Diagnosis not present

## 2012-10-01 DIAGNOSIS — N259 Disorder resulting from impaired renal tubular function, unspecified: Secondary | ICD-10-CM | POA: Diagnosis not present

## 2012-10-01 DIAGNOSIS — I6529 Occlusion and stenosis of unspecified carotid artery: Secondary | ICD-10-CM

## 2012-10-01 LAB — URINALYSIS, ROUTINE W REFLEX MICROSCOPIC
Nitrite: NEGATIVE
Specific Gravity, Urine: 1.02 (ref 1.000–1.030)
Urine Glucose: NEGATIVE
Urobilinogen, UA: 0.2 (ref 0.0–1.0)

## 2012-10-01 LAB — HEPATIC FUNCTION PANEL
Bilirubin, Direct: 0.1 mg/dL (ref 0.0–0.3)
Total Bilirubin: 0.7 mg/dL (ref 0.3–1.2)
Total Protein: 6.8 g/dL (ref 6.0–8.3)

## 2012-10-01 LAB — LIPID PANEL
LDL Cholesterol: 80 mg/dL (ref 0–99)
VLDL: 18.4 mg/dL (ref 0.0–40.0)

## 2012-10-01 LAB — BASIC METABOLIC PANEL
BUN: 35 mg/dL — ABNORMAL HIGH (ref 6–23)
Chloride: 104 mEq/L (ref 96–112)
Potassium: 4.1 mEq/L (ref 3.5–5.1)
Sodium: 137 mEq/L (ref 135–145)

## 2012-10-01 LAB — CK: Total CK: 115 U/L (ref 7–177)

## 2012-10-02 ENCOUNTER — Other Ambulatory Visit: Payer: Self-pay | Admitting: Internal Medicine

## 2012-10-02 MED ORDER — CIPROFLOXACIN HCL 250 MG PO TABS: 250.0000 mg | ORAL_TABLET | Freq: Two times a day (BID) | ORAL | Status: DC

## 2012-10-19 ENCOUNTER — Other Ambulatory Visit (HOSPITAL_BASED_OUTPATIENT_CLINIC_OR_DEPARTMENT_OTHER): Payer: Medicare Other | Admitting: Lab

## 2012-10-19 ENCOUNTER — Ambulatory Visit (HOSPITAL_BASED_OUTPATIENT_CLINIC_OR_DEPARTMENT_OTHER): Payer: Medicare Other

## 2012-10-19 VITALS — BP 139/60 | HR 79 | Temp 98.6°F

## 2012-10-19 DIAGNOSIS — D631 Anemia in chronic kidney disease: Secondary | ICD-10-CM

## 2012-10-19 DIAGNOSIS — N289 Disorder of kidney and ureter, unspecified: Secondary | ICD-10-CM

## 2012-10-19 DIAGNOSIS — D649 Anemia, unspecified: Secondary | ICD-10-CM

## 2012-10-19 LAB — CBC WITH DIFFERENTIAL/PLATELET
BASO%: 1.7 % (ref 0.0–2.0)
EOS%: 2.9 % (ref 0.0–7.0)
HCT: 29.8 % — ABNORMAL LOW (ref 34.8–46.6)
LYMPH%: 30.8 % (ref 14.0–49.7)
MCH: 31.1 pg (ref 25.1–34.0)
MCHC: 34.7 g/dL (ref 31.5–36.0)
MCV: 89.6 fL (ref 79.5–101.0)
MONO%: 10.7 % (ref 0.0–14.0)
NEUT%: 53.9 % (ref 38.4–76.8)
Platelets: 378 10*3/uL (ref 145–400)
RBC: 3.33 10*6/uL — ABNORMAL LOW (ref 3.70–5.45)
WBC: 6.5 10*3/uL (ref 3.9–10.3)

## 2012-10-19 MED ORDER — DARBEPOETIN ALFA-POLYSORBATE 200 MCG/0.4ML IJ SOLN
200.0000 ug | Freq: Once | INTRAMUSCULAR | Status: AC
  Administered 2012-10-19: 200 ug via SUBCUTANEOUS
  Filled 2012-10-19: qty 0.4

## 2012-10-23 ENCOUNTER — Ambulatory Visit (INDEPENDENT_AMBULATORY_CARE_PROVIDER_SITE_OTHER): Payer: Medicare Other | Admitting: Cardiovascular Disease

## 2012-10-23 ENCOUNTER — Encounter: Payer: Self-pay | Admitting: Cardiovascular Disease

## 2012-10-23 VITALS — BP 124/68 | HR 81 | Ht 63.0 in | Wt 177.0 lb

## 2012-10-23 DIAGNOSIS — E785 Hyperlipidemia, unspecified: Secondary | ICD-10-CM | POA: Diagnosis not present

## 2012-10-23 DIAGNOSIS — N189 Chronic kidney disease, unspecified: Secondary | ICD-10-CM

## 2012-10-23 DIAGNOSIS — I2510 Atherosclerotic heart disease of native coronary artery without angina pectoris: Secondary | ICD-10-CM | POA: Diagnosis not present

## 2012-10-23 DIAGNOSIS — I6529 Occlusion and stenosis of unspecified carotid artery: Secondary | ICD-10-CM | POA: Diagnosis not present

## 2012-10-23 DIAGNOSIS — I10 Essential (primary) hypertension: Secondary | ICD-10-CM

## 2012-10-23 DIAGNOSIS — D631 Anemia in chronic kidney disease: Secondary | ICD-10-CM

## 2012-10-23 DIAGNOSIS — I6521 Occlusion and stenosis of right carotid artery: Secondary | ICD-10-CM

## 2012-10-23 NOTE — Progress Notes (Signed)
Patient ID: [name] [name], female   DOB: 01/02/1932, 79 y.o.   MRN: [id number]     [name] [name] returns today for F/U of HTN, palpitations and fatigue. She was hospitalized recently for a diverticular bleed. This seems stable and she has F/U with Dr. Earlean Shawl. She has had palpitations. I reviewed her recenet event monitor and it was benighn with no significant arrythmias. She has been compliant with her BP meds. She has had a previous RCEA by Dr. Amedeo Plenty. She has residua40-59% bilateral stenosis by duplex 10/13  She will have a F/U duplex in10/14  She has had no TIA like symptoms and is off asa now due to her diverticular bleed. She denies SSCP, palpitations, dyspnea, PND orthopnea and edema  She is on a study at [facility] for BP And gets it followed every 3 months   Needs F/U mamogram and carotids Getting Epo infusion for chronic anemia    ROS: Denies fever, malais, weight loss, blurry vision, decreased visual acuity, cough, sputum, SOB, hemoptysis, pleuritic pain, palpitaitons, heartburn, abdominal pain, melena, lower extremity edema, claudication, or rash.  All other systems reviewed and negative  General: Affect appropriate Healthy:  appears stated age 17: normal Neck supple with no adenopathy JVP normal right CEA and faint left  bruits no thyromegaly Lungs clear with no wheezing and good diaphragmatic motion Heart:  S1/S2 no murmur, no rub, gallop or click PMI normal Abdomen: benighn, BS positve, no tenderness, no AAA no bruit.  No HSM or HJR Distal pulses intact with no bruits No edema Neuro non-focal Skin warm and dry No muscular weakness   Current Outpatient Prescriptions  Medication Sig Dispense Refill  . albuterol (PROAIR HFA) 108 (90 BASE) MCG/ACT inhaler Inhale 2 puffs into the lungs every 4 (four) hours as needed.        [name] amLODipine (NORVASC) 2.5 MG tablet Take 2.5 mg by mouth daily.      [name] aspirin 81 MG tablet Take 81 mg by mouth daily.        [name] atorvastatin (LIPITOR) 40 MG  tablet Take 40 mg by mouth daily.      . chlorthalidone (HYGROTON) 25 MG tablet Take 12.5 mg by mouth daily.       . Cholecalciferol (EQL VITAMIN D3) 1000 UNITS tablet Take 1,000 Units by mouth daily.        . ciprofloxacin (CIPRO) 250 MG tablet Take 1 tablet (250 mg total) by mouth 2 (two) times daily.  10 tablet  0  . darbepoetin (ARANESP, ALB FREE, SURECLICK) 123XX123 [id number] SOLN Inject 100 mcg into the skin every 8 (eight) weeks.       . diphenhydrAMINE (BENADRYL) 25 mg capsule Take 1 capsule (25 mg total) by mouth every 6 (six) hours.  30 capsule  0  . diphenhydrAMINE (BENADRYL) 25 MG tablet Take 25 mg by mouth every 6 (six) hours as needed for itching or allergies.      . predniSONE (DELTASONE) 10 MG tablet Take 6 tablets (60 mg total) by mouth daily.  24 tablet  0  . ranitidine (ZANTAC) 150 MG tablet Take 1 tablet (150 mg total) by mouth 2 (two) times daily.  4 tablet  0  . valsartan (DIOVAN) 160 MG tablet Take 160 mg by mouth daily.      . vitamin B-12 (CYANOCOBALAMIN) 1000 MCG tablet Take 1,000 mcg by mouth daily.         No current facility-administered medications for this visit.    Allergies  Codeine  sulfate; Propoxyphene napsylate; and Sulfacetamide sodium-sulfur  Electrocardiogram:  10/3 /13 SR rate 71 normal  Today  SR nonspecific T wave changes incorrectly read as accelerated junctional  Assessment and Plan

## 2012-10-23 NOTE — Assessment & Plan Note (Signed)
F/U duplex known residual [id number] LICA

## 2012-10-23 NOTE — Patient Instructions (Addendum)
Your physician wants you to follow-up in:   [place] will receive a reminder letter in the mail two months in advance. If you don't receive a letter, please call our office to schedule the follow-up appointment. Your physician recommends that you continue on your current medications as directed. Please refer to the Current Medication list given to you today. Your physician has requested that you have a carotid duplex. This test is an ultrasound of the carotid arteries in your neck. It looks at blood flow through these arteries that supply the brain with blood. Allow one hour for this exam. There are no restrictions or special instructions. DUE  NOW

## 2012-10-23 NOTE — Assessment & Plan Note (Signed)
Cholesterol is at goal.  Continue current dose of statin and diet Rx.  No myalgias or side effects.  F/U  LFT's in 6 months. Lab Results  Component Value Date   LDLCALC 80 10/01/2012

## 2012-10-23 NOTE — Assessment & Plan Note (Signed)
Continue EPO  F/u [facility] for diverticular issues Stable

## 2012-10-23 NOTE — Assessment & Plan Note (Signed)
Well controlled.  Continue current medications and low sodium Dash type diet.    

## 2012-10-23 NOTE — Assessment & Plan Note (Signed)
Stable with no angina and good activity level.  Continue medical Rx  

## 2012-10-30 ENCOUNTER — Ambulatory Visit (HOSPITAL_COMMUNITY): Payer: Medicare Other | Attending: Cardiovascular Disease

## 2012-10-30 DIAGNOSIS — I658 Occlusion and stenosis of other precerebral arteries: Secondary | ICD-10-CM | POA: Diagnosis not present

## 2012-10-30 DIAGNOSIS — I6529 Occlusion and stenosis of unspecified carotid artery: Secondary | ICD-10-CM | POA: Diagnosis not present

## 2012-10-30 DIAGNOSIS — I6521 Occlusion and stenosis of right carotid artery: Secondary | ICD-10-CM

## 2012-12-04 ENCOUNTER — Other Ambulatory Visit: Payer: Self-pay

## 2012-12-04 DIAGNOSIS — Z1231 Encounter for screening mammogram for malignant neoplasm of breast: Secondary | ICD-10-CM

## 2012-12-08 DIAGNOSIS — G3184 Mild cognitive impairment, so stated: Secondary | ICD-10-CM | POA: Diagnosis not present

## 2012-12-08 DIAGNOSIS — F411 Generalized anxiety disorder: Secondary | ICD-10-CM | POA: Diagnosis not present

## 2012-12-13 ENCOUNTER — Other Ambulatory Visit: Payer: Self-pay | Admitting: Internal Medicine

## 2012-12-13 DIAGNOSIS — D631 Anemia in chronic kidney disease: Secondary | ICD-10-CM

## 2012-12-14 ENCOUNTER — Encounter: Payer: Self-pay | Admitting: Internal Medicine

## 2012-12-14 ENCOUNTER — Ambulatory Visit (HOSPITAL_BASED_OUTPATIENT_CLINIC_OR_DEPARTMENT_OTHER): Payer: Medicare Other | Admitting: Internal Medicine

## 2012-12-14 ENCOUNTER — Other Ambulatory Visit: Payer: Medicare Other | Admitting: Lab

## 2012-12-14 ENCOUNTER — Ambulatory Visit (HOSPITAL_BASED_OUTPATIENT_CLINIC_OR_DEPARTMENT_OTHER): Payer: Medicare Other

## 2012-12-14 VITALS — BP 121/64 | HR 80 | Temp 97.0°F | Resp 18 | Ht 63.0 in | Wt 178.8 lb

## 2012-12-14 DIAGNOSIS — N189 Chronic kidney disease, unspecified: Secondary | ICD-10-CM

## 2012-12-14 DIAGNOSIS — D631 Anemia in chronic kidney disease: Secondary | ICD-10-CM

## 2012-12-14 DIAGNOSIS — N259 Disorder resulting from impaired renal tubular function, unspecified: Secondary | ICD-10-CM

## 2012-12-14 LAB — COMPREHENSIVE METABOLIC PANEL (CC13)
ALT: 15 U/L (ref 0–55)
AST: 24 U/L (ref 5–34)
Anion Gap: 8 mEq/L (ref 3–11)
CO2: 27 mEq/L (ref 22–29)
Chloride: 102 mEq/L (ref 98–109)
Creatinine: 1.7 mg/dL — ABNORMAL HIGH (ref 0.6–1.1)
Sodium: 137 mEq/L (ref 136–145)
Total Bilirubin: 0.41 mg/dL (ref 0.20–1.20)
Total Protein: 6.7 g/dL (ref 6.4–8.3)

## 2012-12-14 LAB — CBC WITH DIFFERENTIAL/PLATELET
BASO%: 0.8 % (ref 0.0–2.0)
EOS%: 2.5 % (ref 0.0–7.0)
MCH: 31.5 pg (ref 25.1–34.0)
MCHC: 33.8 g/dL (ref 31.5–36.0)
MONO#: 0.5 10*3/uL (ref 0.1–0.9)
NEUT%: 63.1 % (ref 38.4–76.8)
RBC: 3.3 10*6/uL — ABNORMAL LOW (ref 3.70–5.45)
WBC: 6.8 10*3/uL (ref 3.9–10.3)
lymph#: 1.8 10*3/uL (ref 0.9–3.3)

## 2012-12-14 MED ORDER — DARBEPOETIN ALFA-POLYSORBATE 200 MCG/0.4ML IJ SOLN
200.0000 ug | Freq: Once | INTRAMUSCULAR | Status: AC
  Administered 2012-12-14: 200 ug via SUBCUTANEOUS
  Filled 2012-12-14: qty 0.4

## 2012-12-14 NOTE — Patient Instructions (Signed)
arDarbepoetin Alfa injection What is this medicine? DARBEPOETIN ALFA (dar be POE e tin AL fa) helps your body make more red blood cells. It is used to treat anemia caused by chronic kidney failure and chemotherapy. This medicine may be used for other purposes; ask your health care provider or pharmacist if you have questions. COMMON BRAND NAME(S): Aranesp What should I tell my health care provider before I take this medicine? They need to know if you have any of these conditions: -blood clotting disorders or history of blood clots -cancer patient not on chemotherapy -cystic fibrosis -heart disease, such as angina, heart failure, or a history of a heart attack -hemoglobin level of 12 g/dL or greater -high blood pressure -low levels of folate, iron, or vitamin B12 -seizures -an unusual or allergic reaction to darbepoetin, erythropoietin, albumin, hamster proteins, latex, other medicines, foods, dyes, or preservatives -pregnant or trying to get pregnant -breast-feeding How should I use this medicine? This medicine is for injection into a vein or under the skin. It is usually given by a health care professional in a hospital or clinic setting. If you get this medicine at home, you will be taught how to prepare and give this medicine. Do not shake the solution before you withdraw a dose. Use exactly as directed. Take your medicine at regular intervals. Do not take your medicine more often than directed. It is important that you put your used needles and syringes in a special sharps container. Do not put them in a trash can. If you do not have a sharps container, call your pharmacist or healthcare provider to get one. Talk to your pediatrician regarding the use of this medicine in children. While this medicine may be used in children as young as 1 year for selected conditions, precautions do apply. Overdosage: If you think you have taken too much of this medicine contact a poison control center or  emergency room at once. NOTE: This medicine is only for you. Do not share this medicine with others. What if I miss a dose? If you miss a dose, take it as soon as you can. If it is almost time for your next dose, take only that dose. Do not take double or extra doses. What may interact with this medicine? Do not take this medicine with any of the following medications: -epoetin alfa This list may not describe all possible interactions. Give your health care provider a list of all the medicines, herbs, non-prescription drugs, or dietary supplements you use. Also tell them if you smoke, drink alcohol, or use illegal drugs. Some items may interact with your medicine. What should I watch for while using this medicine? Visit your prescriber or health care professional for regular checks on your progress and for the needed blood tests and blood pressure measurements. It is especially important for the doctor to make sure your hemoglobin level is in the desired range, to limit the risk of potential side effects and to give you the best benefit. Keep all appointments for any recommended tests. Check your blood pressure as directed. Ask your doctor what your blood pressure should be and when you should contact him or her. As your body makes more red blood cells, you may need to take iron, folic acid, or vitamin B supplements. Ask your doctor or health care provider which products are right for you. If you have kidney disease continue dietary restrictions, even though this medication can make you feel better. Talk with your doctor or health  care professional about the foods you eat and the vitamins that you take. What side effects may I notice from receiving this medicine? Side effects that you should report to your doctor or health care professional as soon as possible: -allergic reactions like skin rash, itching or hives, swelling of the face, lips, or tongue -breathing problems -changes in vision -chest  pain -confusion, trouble speaking or understanding -feeling faint or lightheaded, falls -high blood pressure -muscle aches or pains -pain, swelling, warmth in the leg -rapid weight gain -severe headaches -sudden numbness or weakness of the face, arm or leg -trouble walking, dizziness, loss of balance or coordination -seizures (convulsions) -swelling of the ankles, feet, hands -unusually weak or tired Side effects that usually do not require medical attention (report to your doctor or health care professional if they continue or are bothersome): -diarrhea -fever, chills (flu-like symptoms) -headaches -nausea, vomiting -redness, stinging, or swelling at site where injected This list may not describe all possible side effects. Call your doctor for medical advice about side effects. You may report side effects to FDA at 1-800-FDA-1088. Where should I keep my medicine? Keep out of the reach of children. Store in a refrigerator between 2 and 8 degrees C (36 and 46 degrees F). Do not freeze. Do not shake. Throw away any unused portion if using a single-dose vial. Throw away any unused medicine after the expiration date. NOTE: This sheet is a summary. It may not cover all possible information. If you have questions about this medicine, talk to your doctor, pharmacist, or health care provider.  2014, [employer]. (2007-12-22 10:23:57)  

## 2012-12-15 ENCOUNTER — Telehealth: Payer: Self-pay | Admitting: Internal Medicine

## 2012-12-15 NOTE — Progress Notes (Signed)
[place] OFFICE PROGRESS NOTE  [name] Kehr, MD [facility] [address] [place]  [address]  DIAGNOSIS: Anemia in chronic renal disease - Plan: CBC with Differential in 2 months, CBC with Differential, Comprehensive metabolic panel, CBC with Differential  RENAL INSUFFICIENCY  Chief Complaint  Patient presents with  . Anemia in chronic renal disease    CURRENT THERAPY: Aranesp 200 mcg subcutaneous every 2 months for a hemoglobin less than 11.   INTERVAL HISTORY: [name] [name] 79 y.o. female with a history of anemia secondary to renal insufficiency is here for follow-up. She was last seen by Dr. Ralene Ok.  She had a bone marrow aspirate and biopsy on 05/14/2007 with negative findings and negative cytogenetics.  She reports doing well overall.  She states that she was stung by yellow jackets in garden in August of this year.  She received benadryl and prednisone.  She denies any additional hospitalizations.   She has been having CBC every 2 months and having aranesp whenever the hemoglobin is less than 11.  She continues to report that her energy is improved on aranesp.  She works at [employer] every other weekend.  She is without complaints today.  She denies any recent falls.    MEDICAL HISTORY: Past Medical History  Diagnosis Date  . Unspecified essential hypertension     Dr [name]  . Other and unspecified hyperlipidemia   . Unspecified disorder resulting from impaired renal function   . Diverticulosis of colon with hemorrhage   . Unspecified vitamin D deficiency   . Unspecified asthma(493.90)   . Persistent disorder of initiating or maintaining sleep   . Lumbago   . Esophageal reflux   . Diverticulosis of colon (without mention of hemorrhage)   . Depressive disorder, not elsewhere classified   . Anxiety state, unspecified   . Anemia, unspecified   . Unspecified osteomyelitis, site unspecified    INTERIM HISTORY: has VITAMIN D  DEFICIENCY; HYPERLIPIDEMIA; DEHYDRATION (VOLUME DEPLETION); ANXIETY; INSOMNIA, PERSISTENT; DEPRESSION; HYPERTENSION; CAD; CAROTID ARTERY DISEASE; PERIPHERAL VASCULAR DISEASE; BRONCHITIS NOT SPECIFIED AS ACUTE OR CHRONIC; ASTHMA; GERD; DIVERTICULOSIS, COLON; Diverticulosis of colon with hemorrhage; PANCREATITIS; RENAL INSUFFICIENCY; PYELONEPHRITIS; UNSPECIFIED DISORDER OF URETHRA&URINARY TRACT; LOW BACK PAIN; OSTEOMYELITIS; SYNCOPE; FATIGUE; SKIN RASH; Well adult exam; Anemia in chronic renal disease; and Cramps, extremity on her problem list.    ALLERGIES:  is allergic to codeine sulfate; propoxyphene napsylate; and sulfacetamide sodium-sulfur.  MEDICATIONS: has a current medication list which includes the following prescription(s): albuterol, amlodipine, aspirin, atorvastatin, chlorthalidone, cholecalciferol, darbepoetin, losartan, vitamin b-12, and ranitidine.  SURGICAL HISTORY:  Past Surgical History  Procedure Laterality Date  . Bone marrow biopsy    . Status post right carotid enterectomy      REVIEW OF SYSTEMS:   Constitutional: Denies fevers, chills or abnormal weight loss Eyes: Denies blurriness of vision Ears, nose, mouth, throat, and face: Denies mucositis or sore throat Respiratory: Denies cough, dyspnea or wheezes Cardiovascular: Denies palpitation, chest discomfort or lower extremity swelling Gastrointestinal:  Denies nausea, heartburn or change in bowel habits Skin: Denies abnormal skin rashes Lymphatics: Denies new lymphadenopathy or easy bruising Neurological:Denies numbness, tingling or new weaknesses Behavioral/Psych: Mood is stable, no new changes  All other systems were reviewed with the patient and are negative.  PHYSICAL EXAMINATION: ECOG PERFORMANCE STATUS: 0 - Asymptomatic  Blood pressure 121/64, pulse 80, temperature 97 F (36.1 C), temperature source Oral, resp. rate 18, height 5\' 3"  (1.6 m), weight 178 lb 12.8  oz (81.103 kg).  GENERAL:alert, no distress and  comfortable; elderly female who is well developed, well nourished.  SKIN: skin color, texture, turgor are normal, no rashes or significant lesions EYES: normal, Conjunctiva are pink and non-injected, sclera clear OROPHARYNX:no exudate, no erythema and lips, buccal mucosa, and tongue normal  NECK: supple, thyroid normal size, non-tender, without nodularity LYMPH:  no palpable lymphadenopathy in the cervical, axillary or supraclavicular LUNGS: clear to auscultation and percussion with normal breathing effort HEART: regular rate & rhythm and no murmurs and no lower extremity edema ABDOMEN:abdomen soft, non-tender and normal bowel sounds Musculoskeletal:no cyanosis of digits and no clubbing  NEURO: alert & oriented x 3 with fluent speech, no focal motor/sensory deficits  LABORATORY DATA: Results for orders placed in visit on 12/14/12 (from the past 48 hour(s))  CBC WITH DIFFERENTIAL     Status: Abnormal   Collection Time    12/14/12  1:29 PM      Result Value Range   WBC 6.8  3.9 - 10.3 10e3/uL   NEUT# 4.3  1.5 - 6.5 10e3/uL   HGB 10.4 (*) 11.6 - 15.9 g/dL   HCT 30.8 (*) 34.8 - 46.6 %   Platelets 370  145 - 400 10e3/uL   MCV 93.2  79.5 - 101.0 fL   MCH 31.5  25.1 - 34.0 pg   MCHC 33.8  31.5 - 36.0 g/dL   RBC 3.30 (*) 3.70 - 5.45 10e6/uL   RDW 14.1  11.2 - 14.5 %   lymph# 1.8  0.9 - 3.3 10e3/uL   MONO# 0.5  0.1 - 0.9 10e3/uL   Eosinophils Absolute 0.2  0.0 - 0.5 10e3/uL   Basophils Absolute 0.1  0.0 - 0.1 10e3/uL   NEUT% 63.1  38.4 - 76.8 %   LYMPH% 25.6  14.0 - 49.7 %   MONO% 8.0  0.0 - 14.0 %   EOS% 2.5  0.0 - 7.0 %   BASO% 0.8  0.0 - 2.0 %  FERRITIN [facility]     Status: None   Collection Time    12/14/12  1:29 PM      Result Value Range   Ferritin 161  9 - 269 ng/ml  LACTATE DEHYDROGENASE (CC13)     Status: None   Collection Time    12/14/12  1:29 PM      Result Value Range   LDH 209  125 - 245 U/L  COMPREHENSIVE METABOLIC PANEL (0000000)     Status: Abnormal   Collection Time     12/14/12  1:29 PM      Result Value Range   Sodium 137  136 - 145 mEq/L   Potassium 3.9  3.5 - 5.1 mEq/L   Chloride 102  98 - 109 mEq/L   CO2 27  22 - 29 mEq/L   Glucose 125  70 - 140 mg/dl   BUN 33.3 (*) 7.0 - 26.0 mg/dL   Creatinine 1.7 (*) 0.6 - 1.1 mg/dL   Total Bilirubin 0.41  0.20 - 1.20 mg/dL   Alkaline Phosphatase 90  40 - 150 U/L   AST 24  5 - 34 U/L   ALT 15  0 - 55 U/L   Total Protein 6.7  6.4 - 8.3 g/dL   Albumin 3.4 (*) 3.5 - 5.0 g/dL   Calcium 9.1  8.4 - 10.4 mg/dL   Anion Gap 8  3 - 11 mEq/L    Labs:  Lab Results  Component Value Date   WBC 6.8  12/14/2012   HGB 10.4* 12/14/2012   HCT 30.8* 12/14/2012   MCV 93.2 12/14/2012   PLT 370 12/14/2012   NEUTROABS 4.3 12/14/2012      Chemistry      Component Value Date/Time   NA 137 12/14/2012 1329   NA 137 10/01/2012 0926   K 3.9 12/14/2012 1329   K 4.1 10/01/2012 0926   CL 104 10/01/2012 0926   CL 105 06/29/2012 1143   CO2 27 12/14/2012 1329   CO2 25 10/01/2012 0926   BUN 33.3* 12/14/2012 1329   BUN 35* 10/01/2012 0926   CREATININE 1.7* 12/14/2012 1329   CREATININE 1.8* 10/01/2012 0926      Component Value Date/Time   CALCIUM 9.1 12/14/2012 1329   CALCIUM 9.2 10/01/2012 0926   ALKPHOS 90 12/14/2012 1329   ALKPHOS 81 10/01/2012 0926   AST 24 12/14/2012 1329   AST 26 10/01/2012 0926   ALT 15 12/14/2012 1329   ALT 19 10/01/2012 0926   BILITOT 0.41 12/14/2012 1329   BILITOT 0.7 10/01/2012 0926     Basic Metabolic Panel:  Recent Labs Lab 12/14/12 1329  NA 137  K 3.9  CO2 27  GLUCOSE 125  BUN 33.3*  CREATININE 1.7*  CALCIUM 9.1   GFR Estimated Creatinine Clearance: 27.1 ml/min (by C-G formula based on Cr of 1.7). Liver Function Tests:  Recent Labs Lab 12/14/12 1329  AST 24  ALT 15  ALKPHOS 90  BILITOT 0.41  PROT 6.7  ALBUMIN 3.4*   CBC:  Recent Labs Lab 12/14/12 1329  WBC 6.8  NEUTROABS 4.3  HGB 10.4*  HCT 30.8*  MCV 93.2  PLT 370   Anemia work up  Recent Labs  12/14/12 1329   FERRITIN 161   Microbiology No results found for this or any previous visit (from the past 240 hour(s)).  Studies:  No results found.   RADIOGRAPHIC STUDIES: No results found.  ASSESSMENT: SARAII GUTIERRES 80 y.o. female with a history of Anemia in chronic renal disease - Plan: CBC with Differential in 2 months, CBC with Differential, Comprehensive metabolic panel, CBC with Differential  RENAL INSUFFICIENCY   PLAN:  1. Anemia in chronic renal disease.   --We will continue CBC every 2 months and give aranesp for hemoglobin less than 11.   Patient is agreeable to this plan and reports improvement in her fatigue.  Her ferritin is adequate.    2. Chronic kidney disease.  --Avoid nephrotoxins and counseled on continued adequate hydration.  Her creatinine is 1.7 today.    3. Follow-up.   --RTC in 6 months with repeat labs including CBC, Chemistries and CBC every 2 months as outlined above.  Patient provided a handout on anemia.    All questions were answered. The patient knows to call the clinic with any problems, questions or concerns. We can certainly see the patient much sooner if necessary.  I spent 10 minutes counseling the patient face to face. The total time spent in the appointment was 15 minutes.    Young Brim, MD 12/15/2012 1:59 PM

## 2012-12-15 NOTE — Telephone Encounter (Signed)
, °

## 2013-01-07 ENCOUNTER — Ambulatory Visit
Admission: RE | Admit: 2013-01-07 | Discharge: 2013-01-07 | Disposition: A | Discharge status: Home / Self Care | Payer: Medicare Other | Source: Ambulatory Visit

## 2013-01-07 DIAGNOSIS — Z1231 Encounter for screening mammogram for malignant neoplasm of breast: Secondary | ICD-10-CM

## 2013-01-19 ENCOUNTER — Other Ambulatory Visit: Payer: Self-pay | Admitting: Cardiovascular Disease

## 2013-02-08 ENCOUNTER — Other Ambulatory Visit (HOSPITAL_BASED_OUTPATIENT_CLINIC_OR_DEPARTMENT_OTHER): Payer: Medicare Other

## 2013-02-08 ENCOUNTER — Ambulatory Visit: Payer: Medicare Other

## 2013-02-08 DIAGNOSIS — D631 Anemia in chronic kidney disease: Secondary | ICD-10-CM | POA: Diagnosis not present

## 2013-02-08 DIAGNOSIS — N189 Chronic kidney disease, unspecified: Principal | ICD-10-CM

## 2013-02-08 DIAGNOSIS — N039 Chronic nephritic syndrome with unspecified morphologic changes: Secondary | ICD-10-CM

## 2013-02-08 LAB — CBC WITH DIFFERENTIAL/PLATELET
BASO%: 1 % (ref 0.0–2.0)
Basophils Absolute: 0.1 10*3/uL (ref 0.0–0.1)
EOS%: 3.7 % (ref 0.0–7.0)
Eosinophils Absolute: 0.2 10*3/uL (ref 0.0–0.5)
HCT: 33.7 % — ABNORMAL LOW (ref 34.8–46.6)
HEMOGLOBIN: 11.3 g/dL — AB (ref 11.6–15.9)
LYMPH#: 1.7 10*3/uL (ref 0.9–3.3)
LYMPH%: 28.5 % (ref 14.0–49.7)
MCH: 31.3 pg (ref 25.1–34.0)
MCHC: 33.5 g/dL (ref 31.5–36.0)
MCV: 93.2 fL (ref 79.5–101.0)
MONO#: 0.6 10*3/uL (ref 0.1–0.9)
MONO%: 9.3 % (ref 0.0–14.0)
NEUT#: 3.5 10*3/uL (ref 1.5–6.5)
NEUT%: 57.5 % (ref 38.4–76.8)
Platelets: 350 10*3/uL (ref 145–400)
RBC: 3.61 10*6/uL — ABNORMAL LOW (ref 3.70–5.45)
RDW: 13.9 % (ref 11.2–14.5)
WBC: 6 10*3/uL (ref 3.9–10.3)

## 2013-02-08 MED ORDER — DARBEPOETIN ALFA-POLYSORBATE 500 MCG/ML IJ SOLN: 200.0000 ug | Freq: Once | INTRAMUSCULAR | Status: DC

## 2013-02-16 ENCOUNTER — Other Ambulatory Visit: Payer: Self-pay | Admitting: Cardiovascular Disease

## 2013-03-01 DIAGNOSIS — J90 Pleural effusion, not elsewhere classified: Secondary | ICD-10-CM | POA: Diagnosis not present

## 2013-03-01 DIAGNOSIS — I510 Cardiac septal defect, acquired: Secondary | ICD-10-CM | POA: Diagnosis not present

## 2013-03-01 DIAGNOSIS — E785 Hyperlipidemia, unspecified: Secondary | ICD-10-CM | POA: Diagnosis present

## 2013-03-01 DIAGNOSIS — I6509 Occlusion and stenosis of unspecified vertebral artery: Secondary | ICD-10-CM | POA: Diagnosis not present

## 2013-03-01 DIAGNOSIS — G4700 Insomnia, unspecified: Secondary | ICD-10-CM | POA: Diagnosis not present

## 2013-03-01 DIAGNOSIS — R609 Edema, unspecified: Secondary | ICD-10-CM | POA: Diagnosis not present

## 2013-03-01 DIAGNOSIS — H269 Unspecified cataract: Secondary | ICD-10-CM | POA: Diagnosis present

## 2013-03-01 DIAGNOSIS — R9431 Abnormal electrocardiogram [ECG] [EKG]: Secondary | ICD-10-CM | POA: Diagnosis not present

## 2013-03-01 DIAGNOSIS — I519 Heart disease, unspecified: Secondary | ICD-10-CM | POA: Diagnosis not present

## 2013-03-01 DIAGNOSIS — Z01818 Encounter for other preprocedural examination: Secondary | ICD-10-CM | POA: Diagnosis not present

## 2013-03-01 DIAGNOSIS — I2510 Atherosclerotic heart disease of native coronary artery without angina pectoris: Secondary | ICD-10-CM | POA: Diagnosis not present

## 2013-03-01 DIAGNOSIS — G589 Mononeuropathy, unspecified: Secondary | ICD-10-CM | POA: Diagnosis not present

## 2013-03-01 DIAGNOSIS — Z8679 Personal history of other diseases of the circulatory system: Secondary | ICD-10-CM | POA: Diagnosis not present

## 2013-03-01 DIAGNOSIS — R059 Cough, unspecified: Secondary | ICD-10-CM | POA: Diagnosis not present

## 2013-03-01 DIAGNOSIS — R5381 Other malaise: Secondary | ICD-10-CM | POA: Diagnosis not present

## 2013-03-01 DIAGNOSIS — M549 Dorsalgia, unspecified: Secondary | ICD-10-CM | POA: Diagnosis not present

## 2013-03-01 DIAGNOSIS — Z9049 Acquired absence of other specified parts of digestive tract: Secondary | ICD-10-CM | POA: Diagnosis not present

## 2013-03-01 DIAGNOSIS — D649 Anemia, unspecified: Secondary | ICD-10-CM | POA: Diagnosis not present

## 2013-03-01 DIAGNOSIS — N390 Urinary tract infection, site not specified: Secondary | ICD-10-CM | POA: Diagnosis not present

## 2013-03-01 DIAGNOSIS — N189 Chronic kidney disease, unspecified: Secondary | ICD-10-CM | POA: Diagnosis not present

## 2013-03-01 DIAGNOSIS — J111 Influenza due to unidentified influenza virus with other respiratory manifestations: Secondary | ICD-10-CM | POA: Diagnosis not present

## 2013-03-01 DIAGNOSIS — Z8719 Personal history of other diseases of the digestive system: Secondary | ICD-10-CM | POA: Diagnosis not present

## 2013-03-01 DIAGNOSIS — N179 Acute kidney failure, unspecified: Secondary | ICD-10-CM | POA: Diagnosis not present

## 2013-03-01 DIAGNOSIS — G3184 Mild cognitive impairment, so stated: Secondary | ICD-10-CM | POA: Diagnosis not present

## 2013-03-01 DIAGNOSIS — K219 Gastro-esophageal reflux disease without esophagitis: Secondary | ICD-10-CM | POA: Diagnosis present

## 2013-03-01 DIAGNOSIS — R55 Syncope and collapse: Secondary | ICD-10-CM | POA: Diagnosis not present

## 2013-03-01 DIAGNOSIS — Z9889 Other specified postprocedural states: Secondary | ICD-10-CM | POA: Diagnosis not present

## 2013-03-01 DIAGNOSIS — R339 Retention of urine, unspecified: Secondary | ICD-10-CM | POA: Diagnosis not present

## 2013-03-01 DIAGNOSIS — Z7982 Long term (current) use of aspirin: Secondary | ICD-10-CM | POA: Diagnosis not present

## 2013-03-01 DIAGNOSIS — J45909 Unspecified asthma, uncomplicated: Secondary | ICD-10-CM | POA: Diagnosis present

## 2013-03-01 DIAGNOSIS — R079 Chest pain, unspecified: Secondary | ICD-10-CM | POA: Diagnosis not present

## 2013-03-01 DIAGNOSIS — R05 Cough: Secondary | ICD-10-CM | POA: Diagnosis not present

## 2013-03-01 DIAGNOSIS — I6529 Occlusion and stenosis of unspecified carotid artery: Secondary | ICD-10-CM | POA: Diagnosis not present

## 2013-03-01 DIAGNOSIS — I214 Non-ST elevation (NSTEMI) myocardial infarction: Secondary | ICD-10-CM | POA: Diagnosis not present

## 2013-03-01 DIAGNOSIS — K5900 Constipation, unspecified: Secondary | ICD-10-CM | POA: Diagnosis not present

## 2013-03-01 DIAGNOSIS — I129 Hypertensive chronic kidney disease with stage 1 through stage 4 chronic kidney disease, or unspecified chronic kidney disease: Secondary | ICD-10-CM | POA: Diagnosis present

## 2013-03-01 DIAGNOSIS — R4182 Altered mental status, unspecified: Secondary | ICD-10-CM | POA: Diagnosis not present

## 2013-03-01 DIAGNOSIS — M159 Polyosteoarthritis, unspecified: Secondary | ICD-10-CM | POA: Diagnosis present

## 2013-03-01 DIAGNOSIS — R0789 Other chest pain: Secondary | ICD-10-CM | POA: Diagnosis not present

## 2013-03-01 DIAGNOSIS — R0602 Shortness of breath: Secondary | ICD-10-CM | POA: Diagnosis not present

## 2013-03-03 DIAGNOSIS — I214 Non-ST elevation (NSTEMI) myocardial infarction: Secondary | ICD-10-CM | POA: Diagnosis not present

## 2013-03-06 DIAGNOSIS — I214 Non-ST elevation (NSTEMI) myocardial infarction: Secondary | ICD-10-CM | POA: Insufficient documentation

## 2013-03-07 DIAGNOSIS — J101 Influenza due to other identified influenza virus with other respiratory manifestations: Secondary | ICD-10-CM | POA: Insufficient documentation

## 2013-03-15 DIAGNOSIS — R5381 Other malaise: Secondary | ICD-10-CM | POA: Insufficient documentation

## 2013-03-15 DIAGNOSIS — J111 Influenza due to unidentified influenza virus with other respiratory manifestations: Secondary | ICD-10-CM | POA: Diagnosis not present

## 2013-03-15 DIAGNOSIS — G3184 Mild cognitive impairment, so stated: Secondary | ICD-10-CM | POA: Diagnosis not present

## 2013-03-15 DIAGNOSIS — I214 Non-ST elevation (NSTEMI) myocardial infarction: Secondary | ICD-10-CM | POA: Diagnosis not present

## 2013-03-15 DIAGNOSIS — G2581 Restless legs syndrome: Secondary | ICD-10-CM | POA: Diagnosis not present

## 2013-03-15 DIAGNOSIS — N189 Chronic kidney disease, unspecified: Secondary | ICD-10-CM | POA: Diagnosis not present

## 2013-03-15 DIAGNOSIS — R6889 Other general symptoms and signs: Secondary | ICD-10-CM | POA: Diagnosis not present

## 2013-03-15 DIAGNOSIS — N179 Acute kidney failure, unspecified: Secondary | ICD-10-CM | POA: Diagnosis not present

## 2013-03-15 DIAGNOSIS — F329 Major depressive disorder, single episode, unspecified: Secondary | ICD-10-CM | POA: Diagnosis not present

## 2013-03-15 DIAGNOSIS — G589 Mononeuropathy, unspecified: Secondary | ICD-10-CM | POA: Diagnosis not present

## 2013-03-15 DIAGNOSIS — F3289 Other specified depressive episodes: Secondary | ICD-10-CM | POA: Diagnosis not present

## 2013-03-15 DIAGNOSIS — E785 Hyperlipidemia, unspecified: Secondary | ICD-10-CM | POA: Diagnosis not present

## 2013-03-15 DIAGNOSIS — D649 Anemia, unspecified: Secondary | ICD-10-CM | POA: Diagnosis not present

## 2013-03-15 DIAGNOSIS — L989 Disorder of the skin and subcutaneous tissue, unspecified: Secondary | ICD-10-CM | POA: Diagnosis not present

## 2013-03-15 DIAGNOSIS — G4700 Insomnia, unspecified: Secondary | ICD-10-CM | POA: Diagnosis not present

## 2013-03-15 DIAGNOSIS — I2510 Atherosclerotic heart disease of native coronary artery without angina pectoris: Secondary | ICD-10-CM | POA: Diagnosis not present

## 2013-03-15 DIAGNOSIS — R110 Nausea: Secondary | ICD-10-CM | POA: Diagnosis not present

## 2013-03-15 DIAGNOSIS — K5900 Constipation, unspecified: Secondary | ICD-10-CM | POA: Diagnosis not present

## 2013-03-15 DIAGNOSIS — R9431 Abnormal electrocardiogram [ECG] [EKG]: Secondary | ICD-10-CM | POA: Diagnosis not present

## 2013-03-15 DIAGNOSIS — Z5189 Encounter for other specified aftercare: Secondary | ICD-10-CM | POA: Diagnosis not present

## 2013-03-15 DIAGNOSIS — I129 Hypertensive chronic kidney disease with stage 1 through stage 4 chronic kidney disease, or unspecified chronic kidney disease: Secondary | ICD-10-CM | POA: Diagnosis not present

## 2013-03-15 DIAGNOSIS — F411 Generalized anxiety disorder: Secondary | ICD-10-CM | POA: Diagnosis not present

## 2013-03-15 DIAGNOSIS — R339 Retention of urine, unspecified: Secondary | ICD-10-CM | POA: Diagnosis not present

## 2013-03-19 ENCOUNTER — Telehealth: Payer: Self-pay | Admitting: Cardiovascular Disease

## 2013-03-19 NOTE — Telephone Encounter (Signed)
New Problem:  Pt's caregiver is calling to let us know the pt has had a heart attack. States she is scheduled for a triple by-pass on March 5th at [facility]... States she just wanted to let Dr. [name] know.Marland KitchenMarland Kitchen

## 2013-03-19 NOTE — Telephone Encounter (Signed)
Will forward message to Dr. [name] that pt had a heart attack and is having CABG x 3 on 3/5 at [facility].

## 2013-03-24 DIAGNOSIS — Z951 Presence of aortocoronary bypass graft: Secondary | ICD-10-CM | POA: Diagnosis not present

## 2013-03-24 DIAGNOSIS — I2510 Atherosclerotic heart disease of native coronary artery without angina pectoris: Secondary | ICD-10-CM | POA: Diagnosis not present

## 2013-03-24 DIAGNOSIS — R112 Nausea with vomiting, unspecified: Secondary | ICD-10-CM | POA: Diagnosis not present

## 2013-03-24 DIAGNOSIS — J90 Pleural effusion, not elsewhere classified: Secondary | ICD-10-CM | POA: Diagnosis not present

## 2013-03-24 DIAGNOSIS — N170 Acute kidney failure with tubular necrosis: Secondary | ICD-10-CM | POA: Diagnosis not present

## 2013-03-24 DIAGNOSIS — E785 Hyperlipidemia, unspecified: Secondary | ICD-10-CM | POA: Diagnosis present

## 2013-03-24 DIAGNOSIS — R9431 Abnormal electrocardiogram [ECG] [EKG]: Secondary | ICD-10-CM | POA: Diagnosis not present

## 2013-03-24 DIAGNOSIS — Z9889 Other specified postprocedural states: Secondary | ICD-10-CM | POA: Diagnosis not present

## 2013-03-24 DIAGNOSIS — D6959 Other secondary thrombocytopenia: Secondary | ICD-10-CM | POA: Diagnosis not present

## 2013-03-24 DIAGNOSIS — R1310 Dysphagia, unspecified: Secondary | ICD-10-CM | POA: Diagnosis not present

## 2013-03-24 DIAGNOSIS — I519 Heart disease, unspecified: Secondary | ICD-10-CM | POA: Diagnosis not present

## 2013-03-24 DIAGNOSIS — R339 Retention of urine, unspecified: Secondary | ICD-10-CM | POA: Diagnosis not present

## 2013-03-24 DIAGNOSIS — I25810 Atherosclerosis of coronary artery bypass graft(s) without angina pectoris: Secondary | ICD-10-CM | POA: Diagnosis not present

## 2013-03-24 DIAGNOSIS — D62 Acute posthemorrhagic anemia: Secondary | ICD-10-CM | POA: Diagnosis not present

## 2013-03-24 DIAGNOSIS — J45909 Unspecified asthma, uncomplicated: Secondary | ICD-10-CM | POA: Diagnosis present

## 2013-03-24 DIAGNOSIS — N183 Chronic kidney disease, stage 3 unspecified: Secondary | ICD-10-CM | POA: Diagnosis present

## 2013-03-24 DIAGNOSIS — R279 Unspecified lack of coordination: Secondary | ICD-10-CM | POA: Diagnosis not present

## 2013-03-24 DIAGNOSIS — I4892 Unspecified atrial flutter: Secondary | ICD-10-CM | POA: Diagnosis present

## 2013-03-24 DIAGNOSIS — R1319 Other dysphagia: Secondary | ICD-10-CM | POA: Diagnosis not present

## 2013-03-24 DIAGNOSIS — G3184 Mild cognitive impairment, so stated: Secondary | ICD-10-CM | POA: Diagnosis present

## 2013-03-24 DIAGNOSIS — Z8719 Personal history of other diseases of the digestive system: Secondary | ICD-10-CM | POA: Diagnosis not present

## 2013-03-24 DIAGNOSIS — D72829 Elevated white blood cell count, unspecified: Secondary | ICD-10-CM | POA: Diagnosis not present

## 2013-03-24 DIAGNOSIS — R1311 Dysphagia, oral phase: Secondary | ICD-10-CM | POA: Diagnosis not present

## 2013-03-24 DIAGNOSIS — M6281 Muscle weakness (generalized): Secondary | ICD-10-CM | POA: Diagnosis not present

## 2013-03-24 DIAGNOSIS — I9589 Other hypotension: Secondary | ICD-10-CM | POA: Diagnosis not present

## 2013-03-24 DIAGNOSIS — K219 Gastro-esophageal reflux disease without esophagitis: Secondary | ICD-10-CM | POA: Diagnosis not present

## 2013-03-24 DIAGNOSIS — F3289 Other specified depressive episodes: Secondary | ICD-10-CM | POA: Diagnosis present

## 2013-03-24 DIAGNOSIS — IMO0002 Reserved for concepts with insufficient information to code with codable children: Secondary | ICD-10-CM | POA: Diagnosis not present

## 2013-03-24 DIAGNOSIS — E871 Hypo-osmolality and hyponatremia: Secondary | ICD-10-CM | POA: Diagnosis not present

## 2013-03-24 DIAGNOSIS — N390 Urinary tract infection, site not specified: Secondary | ICD-10-CM | POA: Diagnosis not present

## 2013-03-24 DIAGNOSIS — F411 Generalized anxiety disorder: Secondary | ICD-10-CM | POA: Diagnosis present

## 2013-03-24 DIAGNOSIS — R293 Abnormal posture: Secondary | ICD-10-CM | POA: Diagnosis not present

## 2013-03-24 DIAGNOSIS — M159 Polyosteoarthritis, unspecified: Secondary | ICD-10-CM | POA: Diagnosis present

## 2013-03-24 DIAGNOSIS — F329 Major depressive disorder, single episode, unspecified: Secondary | ICD-10-CM | POA: Diagnosis present

## 2013-03-24 DIAGNOSIS — I129 Hypertensive chronic kidney disease with stage 1 through stage 4 chronic kidney disease, or unspecified chronic kidney disease: Secondary | ICD-10-CM | POA: Diagnosis present

## 2013-03-24 DIAGNOSIS — N189 Chronic kidney disease, unspecified: Secondary | ICD-10-CM | POA: Diagnosis not present

## 2013-03-24 DIAGNOSIS — J9819 Other pulmonary collapse: Secondary | ICD-10-CM | POA: Diagnosis not present

## 2013-03-24 DIAGNOSIS — N179 Acute kidney failure, unspecified: Secondary | ICD-10-CM | POA: Diagnosis not present

## 2013-03-25 HISTORY — PX: CORONARY ARTERY BYPASS GRAFT: SHX141

## 2013-04-05 ENCOUNTER — Telehealth: Payer: Self-pay

## 2013-04-05 ENCOUNTER — Other Ambulatory Visit: Payer: Self-pay

## 2013-04-05 ENCOUNTER — Ambulatory Visit: Payer: Self-pay

## 2013-04-05 NOTE — Telephone Encounter (Signed)
Daughter called stating pt is admitted at [facility] for an MI and CABG. Giving [name] a courtesy call.

## 2013-04-08 DIAGNOSIS — R059 Cough, unspecified: Secondary | ICD-10-CM | POA: Diagnosis not present

## 2013-04-08 DIAGNOSIS — I219 Acute myocardial infarction, unspecified: Secondary | ICD-10-CM | POA: Diagnosis not present

## 2013-04-08 DIAGNOSIS — IMO0002 Reserved for concepts with insufficient information to code with codable children: Secondary | ICD-10-CM | POA: Diagnosis not present

## 2013-04-08 DIAGNOSIS — N179 Acute kidney failure, unspecified: Secondary | ICD-10-CM | POA: Diagnosis not present

## 2013-04-08 DIAGNOSIS — E041 Nontoxic single thyroid nodule: Secondary | ICD-10-CM | POA: Diagnosis not present

## 2013-04-08 DIAGNOSIS — J9819 Other pulmonary collapse: Secondary | ICD-10-CM | POA: Diagnosis not present

## 2013-04-08 DIAGNOSIS — R293 Abnormal posture: Secondary | ICD-10-CM | POA: Diagnosis not present

## 2013-04-08 DIAGNOSIS — K219 Gastro-esophageal reflux disease without esophagitis: Secondary | ICD-10-CM | POA: Diagnosis not present

## 2013-04-08 DIAGNOSIS — F3289 Other specified depressive episodes: Secondary | ICD-10-CM | POA: Diagnosis not present

## 2013-04-08 DIAGNOSIS — N259 Disorder resulting from impaired renal tubular function, unspecified: Secondary | ICD-10-CM | POA: Diagnosis not present

## 2013-04-08 DIAGNOSIS — R279 Unspecified lack of coordination: Secondary | ICD-10-CM | POA: Diagnosis not present

## 2013-04-08 DIAGNOSIS — N189 Chronic kidney disease, unspecified: Secondary | ICD-10-CM | POA: Diagnosis not present

## 2013-04-08 DIAGNOSIS — E049 Nontoxic goiter, unspecified: Secondary | ICD-10-CM | POA: Diagnosis not present

## 2013-04-08 DIAGNOSIS — I2510 Atherosclerotic heart disease of native coronary artery without angina pectoris: Secondary | ICD-10-CM | POA: Diagnosis not present

## 2013-04-08 DIAGNOSIS — D631 Anemia in chronic kidney disease: Secondary | ICD-10-CM | POA: Diagnosis not present

## 2013-04-08 DIAGNOSIS — F329 Major depressive disorder, single episode, unspecified: Secondary | ICD-10-CM | POA: Diagnosis not present

## 2013-04-08 DIAGNOSIS — G609 Hereditary and idiopathic neuropathy, unspecified: Secondary | ICD-10-CM | POA: Diagnosis not present

## 2013-04-08 DIAGNOSIS — Z951 Presence of aortocoronary bypass graft: Secondary | ICD-10-CM | POA: Diagnosis not present

## 2013-04-08 DIAGNOSIS — J189 Pneumonia, unspecified organism: Secondary | ICD-10-CM | POA: Diagnosis not present

## 2013-04-08 DIAGNOSIS — I4891 Unspecified atrial fibrillation: Secondary | ICD-10-CM | POA: Diagnosis not present

## 2013-04-08 DIAGNOSIS — J111 Influenza due to unidentified influenza virus with other respiratory manifestations: Secondary | ICD-10-CM | POA: Diagnosis not present

## 2013-04-08 DIAGNOSIS — J90 Pleural effusion, not elsewhere classified: Secondary | ICD-10-CM | POA: Diagnosis not present

## 2013-04-08 DIAGNOSIS — I25810 Atherosclerosis of coronary artery bypass graft(s) without angina pectoris: Secondary | ICD-10-CM | POA: Diagnosis not present

## 2013-04-08 DIAGNOSIS — E039 Hypothyroidism, unspecified: Secondary | ICD-10-CM | POA: Diagnosis not present

## 2013-04-08 DIAGNOSIS — I129 Hypertensive chronic kidney disease with stage 1 through stage 4 chronic kidney disease, or unspecified chronic kidney disease: Secondary | ICD-10-CM | POA: Diagnosis not present

## 2013-04-08 DIAGNOSIS — R110 Nausea: Secondary | ICD-10-CM | POA: Diagnosis not present

## 2013-04-08 DIAGNOSIS — I10 Essential (primary) hypertension: Secondary | ICD-10-CM | POA: Diagnosis not present

## 2013-04-08 DIAGNOSIS — R269 Unspecified abnormalities of gait and mobility: Secondary | ICD-10-CM | POA: Diagnosis not present

## 2013-04-08 DIAGNOSIS — R1311 Dysphagia, oral phase: Secondary | ICD-10-CM | POA: Diagnosis not present

## 2013-04-08 DIAGNOSIS — R05 Cough: Secondary | ICD-10-CM | POA: Diagnosis not present

## 2013-04-08 DIAGNOSIS — F411 Generalized anxiety disorder: Secondary | ICD-10-CM | POA: Diagnosis not present

## 2013-04-08 DIAGNOSIS — M6281 Muscle weakness (generalized): Secondary | ICD-10-CM | POA: Diagnosis not present

## 2013-04-08 DIAGNOSIS — Z48812 Encounter for surgical aftercare following surgery on the circulatory system: Secondary | ICD-10-CM | POA: Diagnosis not present

## 2013-04-13 ENCOUNTER — Encounter: Payer: Self-pay | Admitting: Nurse Practitioner

## 2013-04-13 ENCOUNTER — Non-Acute Institutional Stay (SKILLED_NURSING_FACILITY): Payer: Medicare Other | Admitting: Nurse Practitioner

## 2013-04-13 DIAGNOSIS — N189 Chronic kidney disease, unspecified: Secondary | ICD-10-CM

## 2013-04-13 DIAGNOSIS — I4891 Unspecified atrial fibrillation: Secondary | ICD-10-CM

## 2013-04-13 DIAGNOSIS — R110 Nausea: Secondary | ICD-10-CM | POA: Diagnosis not present

## 2013-04-13 DIAGNOSIS — K5900 Constipation, unspecified: Secondary | ICD-10-CM

## 2013-04-13 DIAGNOSIS — I2510 Atherosclerotic heart disease of native coronary artery without angina pectoris: Secondary | ICD-10-CM | POA: Diagnosis not present

## 2013-04-13 DIAGNOSIS — K5731 Diverticulosis of large intestine without perforation or abscess with bleeding: Secondary | ICD-10-CM

## 2013-04-13 DIAGNOSIS — F411 Generalized anxiety disorder: Secondary | ICD-10-CM | POA: Diagnosis not present

## 2013-04-13 DIAGNOSIS — D631 Anemia in chronic kidney disease: Secondary | ICD-10-CM

## 2013-04-13 DIAGNOSIS — N039 Chronic nephritic syndrome with unspecified morphologic changes: Secondary | ICD-10-CM

## 2013-04-13 DIAGNOSIS — N259 Disorder resulting from impaired renal tubular function, unspecified: Secondary | ICD-10-CM

## 2013-04-13 DIAGNOSIS — I10 Essential (primary) hypertension: Secondary | ICD-10-CM

## 2013-04-13 DIAGNOSIS — E559 Vitamin D deficiency, unspecified: Secondary | ICD-10-CM

## 2013-04-13 DIAGNOSIS — G4700 Insomnia, unspecified: Secondary | ICD-10-CM

## 2013-04-13 DIAGNOSIS — E785 Hyperlipidemia, unspecified: Secondary | ICD-10-CM

## 2013-04-13 DIAGNOSIS — K859 Acute pancreatitis without necrosis or infection, unspecified: Secondary | ICD-10-CM

## 2013-04-13 DIAGNOSIS — M869 Osteomyelitis, unspecified: Secondary | ICD-10-CM

## 2013-04-13 DIAGNOSIS — K219 Gastro-esophageal reflux disease without esophagitis: Secondary | ICD-10-CM

## 2013-04-13 NOTE — Progress Notes (Signed)
Patient ID: [name] [name], female   DOB: 03-15-1931, 80 y.o.   MRN: [id number]   Code Status: DNR  Allergies  Allergen Reactions  . Codeine Sulfate Nausea And Vomiting  . Propoxyphene Napsylate Nausea And Vomiting  . Sulfacetamide Sodium-Sulfur     Chief Complaint  Patient presents with  . Medical Managment of Chronic Issues  . Hospitalization Follow-up    CABG x4 03/25/13, anxiety, nausea    HPI: Patient is a 80 y.o. female seen in the SNF at [facility] today for evaluation of nausea, anxiety, s/p CABGS x4 03/25/13,  and other chronic medical conditions.   Hospitalized at [facility] hospital 03/24/13-04/08/13 for CAD and CABG x4 03/25/13. She was admitted following a syncopal episode when she was over the hospital for HTN study at the [facility]. She had a racing heart and left chest pain radiated to her back when she arrived at ED. CTA ruled out dissection. EKG with ST depression in V3-V6. Troponin was elevated. Cardiac Cach showed significant stenosis in the LAD, circumflex, borderline disease in the RCA. Her CABGS was postponed x1 day due to her Flu symptoms.  Problem List Items Addressed This Visit   A-fib     Developed Afib RVR on POD#4(CABG 03/25/13) 03/28/13-Amiodarone started and she was converted to NSR.     Anemia in chronic renal disease     Update CBC. Continue B12 daily. Hx of transfusion.     ANXIETY - Primary     Anxious. Lorazepam 0.11m q6hr prn needed. Continue Trazodone 273mqhs. Check TSH    CAD     CABG x4 03/25/13. ASA, statin, BCB for risk reduction.     Diverticulosis of colon with hemorrhage     S/p partial colectomy 1995    GERD     Continue with PPI. C/o nausea-Zofran prn available to her. Amylase and Lipase to r/o pancreatitis.     HYPERLIPIDEMIA     Continue Lipitor, update lipid panel.     HYPERTENSION     Controlled. Takes Metoprolol.     INSOMNIA, PERSISTENT     Continue with Trazodone.     Nausea alone     Takes PPI daily. C/o nausea.  Denied vomiting, constipation, or diarrhea. Will have prn Zofran available to her. Observe GI consult if no better. ST evaluated the patient-ok with swallowing but signs of GERD. Lipase and Amylase to r/o pancreatitis. May consider USKoreabd to evaluate further if no better.     OSTEOMYELITIS     Of vertebra s/p surgical repair.     PANCREATITIS     C/o nausea, denied abd pain, will obtain Amylase, CMP,  and Lipase to evaluate further.    RENAL INSUFFICIENCY     The patient developed acute Kidney injury from contrast but is was resolved by the time she went to OR for CABG. F/u BMP    Unspecified constipation     Managed with daily MiraLax and Senna II qhs.     VITAMIN D DEFICIENCY     Takes Vit D daily.        Review of Systems:  Review of Systems  Constitutional: Positive for malaise/fatigue. Negative for fever, chills and weight loss.       Generalized weakness.   HENT: Positive for hearing loss. Negative for congestion, ear discharge, ear pain, nosebleeds, sore throat and tinnitus.   Eyes: Negative for blurred vision, double vision, photophobia, pain, discharge and redness.  Respiratory: Negative for cough, hemoptysis,  sputum production, shortness of breath, wheezing and stridor.   Cardiovascular: Positive for leg swelling. Negative for chest pain, palpitations, orthopnea and claudication.       Trace edema in BLE  Gastrointestinal: Positive for nausea. Negative for heartburn, vomiting, abdominal pain, diarrhea, constipation, blood in stool and melena.  Genitourinary: Negative for dysuria, urgency, frequency, hematuria and flank pain.  Musculoskeletal: Negative for back pain, falls, joint pain, myalgias and neck pain.  Skin: Negative for itching and rash.  Neurological: Positive for weakness. Negative for dizziness, tingling, tremors, sensory change, speech change, focal weakness, seizures, loss of consciousness and headaches.  Endo/Heme/Allergies: Negative for environmental  allergies and polydipsia. Does not bruise/bleed easily.  Psychiatric/Behavioral: Positive for memory loss. Negative for depression, suicidal ideas, hallucinations and substance abuse. The patient is nervous/anxious. The patient does not have insomnia.      Past Medical History  Diagnosis Date  . Unspecified essential hypertension     Dr [name]  . Other and unspecified hyperlipidemia   . Unspecified disorder resulting from impaired renal function   . Diverticulosis of colon with hemorrhage   . Unspecified vitamin D deficiency   . Unspecified asthma(493.90)   . Persistent disorder of initiating or maintaining sleep   . Lumbago   . Esophageal reflux   . Diverticulosis of colon (without mention of hemorrhage)   . Depressive disorder, not elsewhere classified   . Anxiety state, unspecified   . Anemia, unspecified   . Unspecified osteomyelitis, site unspecified   . Myocardial infarction   . Thyroid disease    Past Surgical History  Procedure Laterality Date  . Bone marrow biopsy    . Status post right carotid enterectomy    . Partial colectomy  1995    diverticulosis of colon iwth hemorrhage.   . Coronary artery bypass graft  03/25/13    x4. CABS ON PUMP-Surgeon Jomarie Longs.   Social History:   reports that she has never smoked. She has never used smokeless tobacco. She reports that she drinks alcohol. She reports that she does not use illicit drugs.  Family History  Problem Relation Age of Onset  . Hypertension Other   . COPD Mother   . Cancer Father     lymphoma    Medications: Patient's Medications  New Prescriptions   No medications on file  Previous Medications   AMIODARONE (PACERONE) 200 MG TABLET    Take 200 mg by mouth daily.   ASPIRIN 325 MG EC TABLET    Take 325 mg by mouth daily.   ATORVASTATIN (LIPITOR) 40 MG TABLET    TAKE 1 TABLET BY MOUTH EVERY DAY   CHOLECALCIFEROL (EQL VITAMIN D3) 1000 UNITS TABLET    Take 1,000 Units by mouth daily.     DARBEPOETIN  (ARANESP, ALB FREE, SURECLICK) 408 XKG/8.1EH SOLN    Inject 100 mcg into the skin every 8 (eight) weeks.    LEVOTHYROXINE (SYNTHROID, LEVOTHROID) 50 MCG TABLET    Take 50 mcg by mouth daily before breakfast.   METOPROLOL TARTRATE (LOPRESSOR) 25 MG TABLET    Take 12.5 mg by mouth 2 (two) times daily.   OMEPRAZOLE (PRILOSEC) 20 MG CAPSULE    Take 20 mg by mouth daily.   POLYETHYLENE GLYCOL (MIRALAX / GLYCOLAX) PACKET    Take 17 g by mouth daily.   SENNOSIDES-DOCUSATE SODIUM (SENOKOT-S) 8.6-50 MG TABLET    Take 2 tablets by mouth daily.   TRAZODONE (DESYREL) 50 MG TABLET    Take 25 mg by mouth  at bedtime.   VITAMIN B-12 (CYANOCOBALAMIN) 1000 MCG TABLET    Take 1,000 mcg by mouth daily.    Modified Medications   No medications on file  Discontinued Medications   ALBUTEROL (PROAIR HFA) 108 (90 BASE) MCG/ACT INHALER    Inhale 2 puffs into the lungs every 4 (four) hours as needed.     AMLODIPINE (NORVASC) 2.5 MG TABLET    Take 2.5 mg by mouth daily.   ASPIRIN 81 MG TABLET    Take 81 mg by mouth daily.     CHLORTHALIDONE (HYGROTON) 25 MG TABLET    Take 12.5 mg by mouth daily.    LOSARTAN (COZAAR) 50 MG TABLET    Take 50 mg by mouth daily.   RANITIDINE (ZANTAC) 150 MG TABLET    Take 1 tablet (150 mg total) by mouth 2 (two) times daily.     Physical Exam: Physical Exam  Constitutional: She is oriented to person, place, and time. She appears well-developed. No distress.  Overweight. Generalized weakness.   HENT:  Head: Normocephalic.  Right Ear: External ear normal.  Left Ear: External ear normal.  Nose: Nose normal.  Mouth/Throat: Oropharynx is clear and moist.  Eyes: Conjunctivae are normal. Pupils are equal, round, and reactive to light. Right eye exhibits no discharge. Left eye exhibits no discharge.  Neck: Normal range of motion. Neck supple. No JVD present. No tracheal deviation present. No thyromegaly present.  Cardiovascular: Normal rate, regular rhythm and normal heart sounds.     Pulmonary/Chest: No stridor. No respiratory distress. She has no wheezes.  Abdominal: Soft. Bowel sounds are normal. She exhibits no distension and no mass. There is no tenderness. There is no rebound and no guarding.  Musculoskeletal: She exhibits edema. She exhibits no tenderness (LS spine, hips).  Trace edema BLE  Lymphadenopathy:    She has no cervical adenopathy.  Neurological: She is alert and oriented to person, place, and time. She displays normal reflexes. No cranial nerve deficit. She exhibits normal muscle tone. Coordination normal.  Skin: No rash noted. No erythema.  Mid sternum surgical incision healed.   Psychiatric: Her behavior is normal. Judgment and thought content normal. Her mood appears anxious. Her affect is not angry, not blunt, not labile and not inappropriate. Her speech is not delayed, not tangential and not slurred. She is not agitated, not aggressive, not hyperactive, not slowed, not withdrawn, not actively hallucinating and not combative. Thought content is not paranoid and not delusional. Cognition and memory are impaired. She does not express impulsivity or inappropriate judgment. She exhibits a depressed mood. She expresses no homicidal and no suicidal ideation. She expresses no suicidal plans and no homicidal plans. She is communicative. She exhibits abnormal recent memory. She is attentive.    Filed Vitals:   04/13/13 1507  BP: 120/58  Pulse: 72  Temp: 97.5 F (36.4 C)  TempSrc: Tympanic  Resp: 18  SpO2: 91%   Labs reviewed: Basic Metabolic Panel:  Recent Labs  06/29/12 1143 10/01/12 0926 12/14/12 1329 04/15/13  NA 138 137 137 138  K 4.4 4.1 3.9 3.8  CL 105 104  --   --   CO2 _0 --   GLUCOSE 97 95 125  --   BUN 41.4* 35* 33.3* 16  CREATININE 2.0* 1.8* 1.7* 1.9*  CALCIUM 9.3 9.2 9.1  --   TSH  --  4.98  --  14.83*   Liver Function Tests:  Recent Labs  06/29/12 1143 10/01/12 0926 12/14/12  1329 04/15/13  AST _0 ALT _1 ALKPHOS 94 81 90 74  BILITOT 0.60 0.7 0.41  --   PROT 7.0 6.8 6.7  --   ALBUMIN 3.5 3.6 3.4*  --    CBC:  Recent Labs  10/19/12 1056 12/14/12 1329 02/08/13 1016 04/15/13  WBC 6.5 6.8 6.0 5.1  NEUTROABS 3.5 4.3 3.5  --   HGB 10.3* 10.4* 11.3* 8.2*  HCT 29.8* 30.8* 33.7* 24*  MCV 89.6 93.2 93.2  --   PLT 378 370 350 501*   Lipid Panel:  Recent Labs  10/01/12 0926 04/15/13  CHOL 155 92  HDL 56.60 40  LDLCALC 80 30  TRIG 92.0 62  CHOLHDL 3  --     Past Procedures:  03/01/13 chst CTA no evidence of acugte aortic pathology. Subendocardial low attenuation along the anteroapical left ventricle, age indeterminate, with associated myocardial thinning, osteopenia with numerous anterior compression fractures in teh visualized thoracic spine, these area age indeterminate without prior imagine for comparison, mild dependent interlobular septal thickening which could be related to early interstitial pulmonary edema.   03/02/13 Echocardiogram: EF 45-50%. There are regional wall motion abnormalities. Trace aortic regurgitation, trace mitral regurgitation, trace pulmonic valvular regurgitation, trace tricuspid regurgitation.   03/03/13 Pulmonary function test: FVC 1.65, FEV1 1.35, FEV1/FVC 81, TLC 3.66  03/25/13 ECG SR. T wave abnormality consider anteolateral ischemia    Assessment/Plan ANXIETY Anxious. Lorazepam 0.49m q6hr prn needed. Continue Trazodone 257mqhs. Check TSH  Nausea alone Takes PPI daily. C/o nausea. Denied vomiting, constipation, or diarrhea. Will have prn Zofran available to her. Observe GI consult if no better. ST evaluated the patient-ok with swallowing but signs of GERD. Lipase and Amylase to r/o pancreatitis. May consider USKoreabd to evaluate further if no better.   Anemia in chronic renal disease Update CBC. Continue B12 daily. Hx of transfusion.   CAD CABG x4 03/25/13. ASA, statin, BCB for risk reduction.   GERD Continue with PPI. C/o nausea-Zofran prn  available to her. Amylase and Lipase to r/o pancreatitis.   PANCREATITIS C/o nausea, denied abd pain, will obtain Amylase, CMP,  and Lipase to evaluate further.  INSOMNIA, PERSISTENT Continue with Trazodone.   HYPERTENSION Controlled. Takes Metoprolol.   HYPERLIPIDEMIA Continue Lipitor, update lipid panel.   Unspecified constipation Managed with daily MiraLax and Senna II qhs.   VITAMIN D DEFICIENCY Takes Vit D daily.   OSTEOMYELITIS Of vertebra s/p surgical repair.   Diverticulosis of colon with hemorrhage S/p partial colectomy 1995  A-fib Developed Afib RVR on POD#4(CABG 03/25/13) 03/28/13-Amiodarone started and she was converted to NSR.   RENAL INSUFFICIENCY The patient developed acute Kidney injury from contrast but is was resolved by the time she went to OR for CABG. F/u BMP    Family/ Staff Communication: observe the patient.   Goals of Care: [place]  Labs/tests ordered: CBC, CMP, lipid panel, TSH

## 2013-04-15 LAB — BASIC METABOLIC PANEL
BUN: 16 mg/dL (ref 4–21)
CREATININE: 1.9 mg/dL — AB (ref 0.5–1.1)
Glucose: 89 mg/dL
Potassium: 3.8 mmol/L (ref 3.4–5.3)
SODIUM: 138 mmol/L (ref 137–147)

## 2013-04-15 LAB — CBC AND DIFFERENTIAL
HCT: 24 % — AB (ref 36–46)
HEMOGLOBIN: 8.2 g/dL — AB (ref 12.0–16.0)
Platelets: 501 10*3/uL — AB (ref 150–399)
WBC: 5.1 10^3/mL

## 2013-04-15 LAB — LIPID PANEL
CHOLESTEROL: 92 mg/dL (ref 0–200)
HDL: 40 mg/dL (ref 35–70)
LDL Cholesterol: 30 mg/dL
Triglycerides: 62 mg/dL (ref 40–160)

## 2013-04-15 LAB — HEPATIC FUNCTION PANEL
ALT: 12 U/L (ref 7–35)
AST: 20 U/L (ref 13–35)
Alkaline Phosphatase: 74 U/L (ref 25–125)
Bilirubin, Total: 0.7 mg/dL

## 2013-04-15 LAB — TSH: TSH: 14.83 u[IU]/mL — AB (ref 0.41–5.90)

## 2013-04-16 ENCOUNTER — Non-Acute Institutional Stay (SKILLED_NURSING_FACILITY): Payer: Medicare Other | Admitting: Nurse Practitioner

## 2013-04-16 DIAGNOSIS — K5900 Constipation, unspecified: Secondary | ICD-10-CM

## 2013-04-16 DIAGNOSIS — E559 Vitamin D deficiency, unspecified: Secondary | ICD-10-CM

## 2013-04-16 DIAGNOSIS — R252 Cramp and spasm: Secondary | ICD-10-CM

## 2013-04-16 DIAGNOSIS — K859 Acute pancreatitis without necrosis or infection, unspecified: Secondary | ICD-10-CM

## 2013-04-16 DIAGNOSIS — N189 Chronic kidney disease, unspecified: Secondary | ICD-10-CM

## 2013-04-16 DIAGNOSIS — D631 Anemia in chronic kidney disease: Secondary | ICD-10-CM

## 2013-04-16 DIAGNOSIS — K219 Gastro-esophageal reflux disease without esophagitis: Secondary | ICD-10-CM | POA: Diagnosis not present

## 2013-04-16 DIAGNOSIS — R110 Nausea: Secondary | ICD-10-CM

## 2013-04-16 DIAGNOSIS — R05 Cough: Secondary | ICD-10-CM

## 2013-04-16 DIAGNOSIS — N259 Disorder resulting from impaired renal tubular function, unspecified: Secondary | ICD-10-CM

## 2013-04-16 DIAGNOSIS — I10 Essential (primary) hypertension: Secondary | ICD-10-CM

## 2013-04-16 DIAGNOSIS — F411 Generalized anxiety disorder: Secondary | ICD-10-CM

## 2013-04-16 DIAGNOSIS — N039 Chronic nephritic syndrome with unspecified morphologic changes: Secondary | ICD-10-CM

## 2013-04-16 DIAGNOSIS — R059 Cough, unspecified: Secondary | ICD-10-CM

## 2013-04-16 DIAGNOSIS — E039 Hypothyroidism, unspecified: Secondary | ICD-10-CM | POA: Diagnosis not present

## 2013-04-16 DIAGNOSIS — E785 Hyperlipidemia, unspecified: Secondary | ICD-10-CM

## 2013-04-16 NOTE — Assessment & Plan Note (Signed)
Continue with PPI. C/o nausea-Zofran prn available to her. Amylase and Lipase to r/o pancreatitis.

## 2013-04-16 NOTE — Assessment & Plan Note (Signed)
Continue with PPI(previously taking Ranitidine). C/o nausea-Zofran prn available to her. Amylase and Lipase unremarkable. 49/44 04/15/13

## 2013-04-16 NOTE — Progress Notes (Signed)
Patient ID: [name] [name], female   DOB: February 27, 1931, 80 y.o.   MRN: [id number]   Code Status: DNR  Allergies  Allergen Reactions  . Codeine Sulfate Nausea And Vomiting  . Propoxyphene Napsylate Nausea And Vomiting  . Sulfacetamide Sodium-Sulfur     Chief Complaint  Patient presents with  . Medical Managment of Chronic Issues  . Acute Visit    anemia, coguh, restless leg.     HPI: Patient is a 80 y.o. 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  Problem List Items Addressed This Visit   Anemia in chronic renal disease     Worse, continued with B12. Hx of Aranesp inj(f/u q2 months at cancer center). Hgb 8.2 04/15/13. Anemia panel.     ANXIETY     Anxious. Lorazepam 0.63m q6hr prn needed. Continue Trazodone 273mqhs. New-hypothyroidism.      Relevant Medications      traZODone (DESYREL) 50 MG tablet   Cough     Hx of Chlorthalidone use. Now hacking cough and mild edema BLE-TED daily. Will update CXR and BNP.     Cramps, extremity     Hx of it. Seems worse.     GERD     Continue with PPI(previously taking Ranitidine). C/o nausea-Zofran prn available to her. Amylase and Lipase unremarkable. 49/44 04/15/13    Relevant Medications      omeprazole (PRILOSEC) 20 MG capsule      polyethylene glycol (MIRALAX / GLYCOLAX) packet      sennosides-docusate sodium (SENOKOT-S) 8.6-50 MG tablet   HYPERLIPIDEMIA     LDL at goal, 40 04/15/13.     Relevant Medications      metoprolol tartrate (LOPRESSOR) 25 MG tablet      aspirin 325 MG EC tablet      amiodarone (PACERONE) 200 MG tablet   HYPERTENSION     Controlled. Takes Metoprolol(previously taking Losartan and Amlodipine)     Nausea alone     Takes PPI daily. C/o nausea. Denied vomiting, constipation, or diarrhea. Will have prn Zofran available to her. Observe GI consult if no better. ST evaluated the  patient-ok with swallowing but signs of GERD. Lipase and Amylase normal 04/15/13     PANCREATITIS     Normal Amylase and Lipase. 04/15/13    RENAL INSUFFICIENCY     Creatinine 1.89 04/15/13    Unspecified constipation     Managed with daily MiraLax and Senna II qhs.      Unspecified hypothyroidism - Primary     New, TSH 14.834. Will start Levothyroxine 5038mpo daily, update TSH in 6 weeks. US [place].     Relevant Medications      levothyroxine (SYNTHROID, LEVOTHROID) 50 MCG tablet   VITAMIN D DEFICIENCY     Continue Vit D       Review of Systems:  Review of Systems  Constitutional: Positive for malaise/fatigue. Negative for fever, chills and weight loss.       Generalized weakness.   HENT: Positive for hearing loss. Negative for congestion, ear discharge, ear pain, nosebleeds, sore throat and tinnitus.   Eyes: Negative for blurred vision, double vision, photophobia, pain, discharge and redness.  Respiratory: Negative for cough, hemoptysis, sputum production, shortness of breath, wheezing and stridor.   Cardiovascular: Positive for leg swelling. Negative for chest pain, palpitations, orthopnea and claudication.       Trace edema  in BLE  Gastrointestinal: Positive for nausea. Negative for heartburn, vomiting, abdominal pain, diarrhea, constipation, blood in stool and melena.  Genitourinary: Negative for dysuria, urgency, frequency, hematuria and flank pain.  Musculoskeletal: Negative for back pain, falls, joint pain, myalgias and neck pain.  Skin: Negative for itching and rash.  Neurological: Positive for weakness. Negative for dizziness, tingling, tremors, sensory change, speech change, focal weakness, seizures, loss of consciousness and headaches.  Endo/Heme/Allergies: Negative for environmental allergies and polydipsia. Does not bruise/bleed easily.  Psychiatric/Behavioral: Positive for memory loss. Negative for depression, suicidal ideas, hallucinations and substance abuse.  The patient is nervous/anxious. The patient does not have insomnia.      Past Medical History  Diagnosis Date  . Unspecified essential hypertension     Dr [name]  . Other and unspecified hyperlipidemia   . Unspecified disorder resulting from impaired renal function   . Diverticulosis of colon with hemorrhage   . Unspecified vitamin D deficiency   . Unspecified asthma(493.90)   . Persistent disorder of initiating or maintaining sleep   . Lumbago   . Esophageal reflux   . Diverticulosis of colon (without mention of hemorrhage)   . Depressive disorder, not elsewhere classified   . Anxiety state, unspecified   . Anemia, unspecified   . Unspecified osteomyelitis, site unspecified   . Myocardial infarction   . Thyroid disease    Past Surgical History  Procedure Laterality Date  . Bone marrow biopsy    . Status post right carotid enterectomy    . Partial colectomy  1995    diverticulosis of colon iwth hemorrhage.   . Coronary artery bypass graft  03/25/13    x4. CABS ON PUMP-Surgeon Jomarie Longs.   Social History:   reports that she has never smoked. She has never used smokeless tobacco. She reports that she drinks alcohol. She reports that she does not use illicit drugs.  Family History  Problem Relation Age of Onset  . Hypertension Other   . COPD Mother   . Cancer Father     lymphoma    Medications: Patient's Medications  New Prescriptions   No medications on file  Previous Medications   AMIODARONE (PACERONE) 200 MG TABLET    Take 200 mg by mouth daily.   ASPIRIN 325 MG EC TABLET    Take 325 mg by mouth daily.   ATORVASTATIN (LIPITOR) 40 MG TABLET    TAKE 1 TABLET BY MOUTH EVERY DAY   CHOLECALCIFEROL (EQL VITAMIN D3) 1000 UNITS TABLET    Take 1,000 Units by mouth daily.     DARBEPOETIN (ARANESP, ALB FREE, SURECLICK) 638 GTX/6.4WO SOLN    Inject 100 mcg into the skin every 8 (eight) weeks.    LEVOTHYROXINE (SYNTHROID, LEVOTHROID) 50 MCG TABLET    Take 50 mcg by mouth daily  before breakfast.   METOPROLOL TARTRATE (LOPRESSOR) 25 MG TABLET    Take 12.5 mg by mouth 2 (two) times daily.   OMEPRAZOLE (PRILOSEC) 20 MG CAPSULE    Take 20 mg by mouth daily.   POLYETHYLENE GLYCOL (MIRALAX / GLYCOLAX) PACKET    Take 17 g by mouth daily.   SENNOSIDES-DOCUSATE SODIUM (SENOKOT-S) 8.6-50 MG TABLET    Take 2 tablets by mouth daily.   TRAZODONE (DESYREL) 50 MG TABLET    Take 25 mg by mouth at bedtime.   VITAMIN B-12 (CYANOCOBALAMIN) 1000 MCG TABLET    Take 1,000 mcg by mouth daily.    Modified Medications   No medications on file  Discontinued Medications   ALBUTEROL (PROAIR HFA) 108 (90 BASE) MCG/ACT INHALER    Inhale 2 puffs into the lungs every 4 (four) hours as needed.     AMLODIPINE (NORVASC) 2.5 MG TABLET    Take 2.5 mg by mouth daily.   ASPIRIN 81 MG TABLET    Take 81 mg by mouth daily.     CHLORTHALIDONE (HYGROTON) 25 MG TABLET    Take 12.5 mg by mouth daily.    LOSARTAN (COZAAR) 50 MG TABLET    Take 50 mg by mouth daily.   RANITIDINE (ZANTAC) 150 MG TABLET    Take 1 tablet (150 mg total) by mouth 2 (two) times daily.   Physical Exam: Physical Exam  Constitutional: She is oriented to person, place, and time. She appears well-developed. No distress.  Overweight. Generalized weakness.   HENT:  Head: Normocephalic.  Right Ear: External ear normal.  Left Ear: External ear normal.  Nose: Nose normal.  Mouth/Throat: Oropharynx is clear and moist.  Eyes: Conjunctivae are normal. Pupils are equal, round, and reactive to light. Right eye exhibits no discharge. Left eye exhibits no discharge.  Neck: Normal range of motion. Neck supple. No JVD present. No tracheal deviation present. No thyromegaly present.  Cardiovascular: Normal rate, regular rhythm and normal heart sounds.   Pulmonary/Chest: No stridor. No respiratory distress. She has no wheezes.  Abdominal: Soft. Bowel sounds are normal. She exhibits no distension and no mass. There is no tenderness. There is no rebound  and no guarding.  Musculoskeletal: She exhibits edema. She exhibits no tenderness (LS spine, hips).  Trace edema BLE  Lymphadenopathy:    She has no cervical adenopathy.  Neurological: She is alert and oriented to person, place, and time. She displays normal reflexes. No cranial nerve deficit. She exhibits normal muscle tone. Coordination normal.  Skin: No rash noted. No erythema.  Mid sternum surgical incision healed.   Psychiatric: Her behavior is normal. Judgment and thought content normal. Her mood appears anxious. Her affect is not angry, not blunt, not labile and not inappropriate. Her speech is not delayed, not tangential and not slurred. She is not agitated, not aggressive, not hyperactive, not slowed, not withdrawn, not actively hallucinating and not combative. Thought content is not paranoid and not delusional. Cognition and memory are impaired. She does not express impulsivity or inappropriate judgment. She exhibits a depressed mood. She expresses no homicidal and no suicidal ideation. She expresses no suicidal plans and no homicidal plans. She is communicative. She exhibits abnormal recent memory. She is attentive.    Filed Vitals:   04/16/13 1420  BP: 137/76  Pulse: 77  Temp: 98.1 F (36.7 C)  TempSrc: Tympanic  Resp: 19   Labs reviewed: Basic Metabolic Panel:  Recent Labs  06/29/12 1143 10/01/12 0926 12/14/12 1329 04/15/13  NA 138 137 137 138  K 4.4 4.1 3.9 3.8  CL 105 104  --   --   CO2 _0 --   GLUCOSE 97 95 125  --   BUN 41.4* 35* 33.3* 16  CREATININE 2.0* 1.8* 1.7* 1.9*  CALCIUM 9.3 9.2 9.1  --   TSH  --  4.98  --  14.83*   Liver Function Tests:  Recent Labs  06/29/12 1143 10/01/12 0926 12/14/12 1329 04/15/13  AST _1 ALT _2 ALKPHOS 94 81 90 74  BILITOT 0.60 0.7 0.41  --   PROT 7.0 6.8 6.7  --  ALBUMIN 3.5 3.6 3.4*  --    CBC:  Recent Labs  10/19/12 1056 12/14/12 1329 02/08/13 1016 04/15/13  WBC 6.5 6.8 6.0 5.1    NEUTROABS 3.5 4.3 3.5  --   HGB 10.3* 10.4* 11.3* 8.2*  HCT 29.8* 30.8* 33.7* 24*  MCV 89.6 93.2 93.2  --   PLT 378 370 350 501*   Lipid Panel:  Recent Labs  10/01/12 0926 04/15/13  CHOL 155 92  HDL 56.60 40  LDLCALC 80 30  TRIG 92.0 62  CHOLHDL 3  --     Past Procedures:  03/01/13 chst CTA no evidence of acugte aortic pathology. Subendocardial low attenuation along the anteroapical left ventricle, age indeterminate, with associated myocardial thinning, osteopenia with numerous anterior compression fractures in teh visualized thoracic spine, these area age indeterminate without prior imagine for comparison, mild dependent interlobular septal thickening which could be related to early interstitial pulmonary edema.   03/02/13 Echocardiogram: EF 45-50%. There are regional wall motion abnormalities. Trace aortic regurgitation, trace mitral regurgitation, trace pulmonic valvular regurgitation, trace tricuspid regurgitation.   03/03/13 Pulmonary function test: FVC 1.65, FEV1 1.35, FEV1/FVC 81, TLC 3.66   03/25/13 ECG SR. T wave abnormality consider anteolateral ischemia   Assessment/Plan GERD Continue with PPI(previously taking Ranitidine). C/o nausea-Zofran prn available to her. Amylase and Lipase unremarkable. 49/44 04/15/13  HYPERTENSION Controlled. Takes Metoprolol(previously taking Losartan and Amlodipine)   Anemia in chronic renal disease Worse, continued with B12. Hx of Aranesp inj(f/u q2 months at cancer center). Hgb 8.2 04/15/13. Anemia panel.   Unspecified hypothyroidism New, TSH 14.834. Will start Levothyroxine 50mg po daily, update TSH in 6 weeks. UKoreathyroid.   Unspecified constipation Managed with daily MiraLax and Senna II qhs.    RENAL INSUFFICIENCY Creatinine 1.89 04/15/13  PANCREATITIS Normal Amylase and Lipase. 04/15/13  Nausea alone Takes PPI daily. C/o nausea. Denied vomiting, constipation, or diarrhea. Will have prn Zofran available to her. Observe GI  consult if no better. ST evaluated the patient-ok with swallowing but signs of GERD. Lipase and Amylase normal 04/15/13   HYPERLIPIDEMIA LDL at goal, 40 04/15/13.   Cough Hx of Chlorthalidone use. Now hacking cough and mild edema BLE-TED daily. Will update CXR and BNP.   Cramps, extremity Hx of it. Seems worse.   ANXIETY Anxious. Lorazepam 0.556mq6hr prn needed. Continue Trazodone 2515mhs. New-hypothyroidism.    VITAMIN D DEFICIENCY Continue Vit D    Family/ Staff Communication: observe the patient.   Goals of Care: [place]  Labs/tests ordered: CBC and anemia panel. TSH in 6 weeks.

## 2013-04-16 NOTE — Assessment & Plan Note (Signed)
Continue Vit D 

## 2013-04-16 NOTE — Assessment & Plan Note (Signed)
Anxious. Lorazepam 0.5mg  q6hr prn needed. Continue Trazodone 25mg  qhs. New-hypothyroidism.

## 2013-04-16 NOTE — Assessment & Plan Note (Signed)
Hx of Chlorthalidone use. Now hacking cough and mild edema BLE-TED daily. Will update CXR and BNP.

## 2013-04-16 NOTE — Assessment & Plan Note (Signed)
Managed with daily MiraLax and Senna II qhs.

## 2013-04-16 NOTE — Assessment & Plan Note (Addendum)
Takes PPI daily. C/o nausea. Denied vomiting, constipation, or diarrhea. Will have prn Zofran available to her. Observe GI consult if no better. ST evaluated the patient-ok with swallowing but signs of GERD. Lipase and Amylase to r/o pancreatitis. May consider [name] abd to evaluate further if no better.

## 2013-04-16 NOTE — Assessment & Plan Note (Signed)
CABG x4 03/25/13. ASA, statin, BCB for risk reduction.

## 2013-04-16 NOTE — Assessment & Plan Note (Addendum)
Worse, continued with B12. Hx of Aranesp inj(f/u q2 months at cancer center). Hgb 8.2 04/15/13. Anemia panel.

## 2013-04-16 NOTE — Assessment & Plan Note (Signed)
Controlled. Takes Metoprolol.

## 2013-04-16 NOTE — Assessment & Plan Note (Signed)
C/o nausea, denied abd pain, will obtain Amylase, CMP,  and Lipase to evaluate further.

## 2013-04-16 NOTE — Assessment & Plan Note (Signed)
Takes PPI daily. C/o nausea. Denied vomiting, constipation, or diarrhea. Will have prn Zofran available to her. Observe GI consult if no better. ST evaluated the patient-ok with swallowing but signs of GERD. Lipase and Amylase normal 04/15/13

## 2013-04-16 NOTE — Assessment & Plan Note (Signed)
Hx of it. Seems worse.

## 2013-04-16 NOTE — Assessment & Plan Note (Signed)
Takes Vit D daily.

## 2013-04-16 NOTE — Assessment & Plan Note (Addendum)
Controlled. Takes Metoprolol(previously taking Losartan and Amlodipine)

## 2013-04-16 NOTE — Assessment & Plan Note (Signed)
Creatinine 1.89 04/15/13

## 2013-04-16 NOTE — Assessment & Plan Note (Signed)
Continue Lipitor, update lipid panel.

## 2013-04-16 NOTE — Assessment & Plan Note (Signed)
Continue with Trazodone.

## 2013-04-16 NOTE — Assessment & Plan Note (Signed)
Normal Amylase and Lipase. 04/15/13

## 2013-04-16 NOTE — Assessment & Plan Note (Signed)
LDL at goal, 40 04/15/13.

## 2013-04-16 NOTE — Assessment & Plan Note (Addendum)
Update CBC. Continue B12 daily. Hx of transfusion.

## 2013-04-16 NOTE — Assessment & Plan Note (Addendum)
Anxious. Lorazepam 0.5mg  q6hr prn needed. Continue Trazodone 25mg  qhs. Check TSH

## 2013-04-16 NOTE — Assessment & Plan Note (Addendum)
New, TSH 14.834. Will start Levothyroxine 6mcg po daily, update TSH in 6 weeks. US thyroid.

## 2013-04-17 ENCOUNTER — Encounter: Payer: Self-pay | Admitting: Cardiovascular Disease

## 2013-04-18 ENCOUNTER — Encounter: Payer: Self-pay | Admitting: Nurse Practitioner

## 2013-04-18 DIAGNOSIS — I4891 Unspecified atrial fibrillation: Secondary | ICD-10-CM | POA: Insufficient documentation

## 2013-04-18 NOTE — Assessment & Plan Note (Signed)
S/p partial colectomy 1995

## 2013-04-18 NOTE — Assessment & Plan Note (Signed)
Developed Afib RVR on POD#4(CABG 03/25/13) 03/28/13-Amiodarone started and she was converted to NSR.

## 2013-04-18 NOTE — Assessment & Plan Note (Signed)
The patient developed acute Kidney injury from contrast but is was resolved by the time she went to OR for CABG. F/u BMP

## 2013-04-18 NOTE — Assessment & Plan Note (Signed)
Of vertebra s/p surgical repair.

## 2013-04-19 ENCOUNTER — Telehealth: Payer: Self-pay | Admitting: Internal Medicine

## 2013-04-19 NOTE — Telephone Encounter (Signed)
s.w. nursing home and advised on r/s appt....done...they are aware

## 2013-04-19 NOTE — Telephone Encounter (Signed)
nursing facility called to r/s appt due to transport issues......done

## 2013-04-20 ENCOUNTER — Non-Acute Institutional Stay (SKILLED_NURSING_FACILITY): Payer: Medicare Other | Admitting: Nurse Practitioner

## 2013-04-20 ENCOUNTER — Encounter: Payer: Self-pay | Admitting: Nurse Practitioner

## 2013-04-20 DIAGNOSIS — I4891 Unspecified atrial fibrillation: Secondary | ICD-10-CM

## 2013-04-20 DIAGNOSIS — N259 Disorder resulting from impaired renal tubular function, unspecified: Secondary | ICD-10-CM

## 2013-04-20 DIAGNOSIS — K219 Gastro-esophageal reflux disease without esophagitis: Secondary | ICD-10-CM

## 2013-04-20 DIAGNOSIS — I509 Heart failure, unspecified: Secondary | ICD-10-CM

## 2013-04-20 DIAGNOSIS — F329 Major depressive disorder, single episode, unspecified: Secondary | ICD-10-CM

## 2013-04-20 DIAGNOSIS — F3289 Other specified depressive episodes: Secondary | ICD-10-CM | POA: Diagnosis not present

## 2013-04-20 DIAGNOSIS — D631 Anemia in chronic kidney disease: Secondary | ICD-10-CM

## 2013-04-20 DIAGNOSIS — J189 Pneumonia, unspecified organism: Secondary | ICD-10-CM | POA: Insufficient documentation

## 2013-04-20 DIAGNOSIS — N039 Chronic nephritic syndrome with unspecified morphologic changes: Secondary | ICD-10-CM

## 2013-04-20 DIAGNOSIS — N189 Chronic kidney disease, unspecified: Secondary | ICD-10-CM

## 2013-04-20 DIAGNOSIS — K5900 Constipation, unspecified: Secondary | ICD-10-CM

## 2013-04-20 DIAGNOSIS — E039 Hypothyroidism, unspecified: Secondary | ICD-10-CM

## 2013-04-20 DIAGNOSIS — I10 Essential (primary) hypertension: Secondary | ICD-10-CM

## 2013-04-20 NOTE — Assessment & Plan Note (Signed)
Presented with cough and SOB-CXR 04/16/13 showd probable bilateral lower lobe infiltrates of pneumonia-Avelox 400mg  daily for 10 days started 04/17/13 along with FloraStor, Robitussin, and Neb. Observe the patient. CHF also contributed to her respiratory symptoms.

## 2013-04-20 NOTE — Assessment & Plan Note (Signed)
New, TSH 14.834. Will start Levothyroxine 68mcg po daily, update TSH in 6 weeks. US thyroid pending. ? AR of  Amiodarone

## 2013-04-20 NOTE — Assessment & Plan Note (Signed)
Rate controlled on amiodarone. EKG 04/18/13 showed QT/QTc 416/500-will update her Cardiology.

## 2013-04-20 NOTE — Progress Notes (Signed)
Patient ID: [name] [name], female   DOB: 03/22/1931, 80 y.o.   MRN: [id number]   Code Status: DNR  Allergies  Allergen Reactions  . Codeine Sulfate Nausea And Vomiting  . Propoxyphene Napsylate Nausea And Vomiting  . Sulfacetamide Sodium-Sulfur     Chief Complaint  Patient presents with  . Medical Managment of Chronic Issues  . Acute Visit    PNA, CHF, anemia, CKD    HPI: Patient is a 80 y.o. 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  Problem List Items Addressed This Visit   A-fib     Rate controlled on amiodarone. EKG 04/18/13 showed QT/QTc 416/500-will update her Cardiology.     Anemia in chronic renal disease     continued with B12. Hx of Aranesp inj(f/u q2 months at cancer center) and blood transfusion. Hgb 8.2 04/15/13 and 8.3 04/17/13. Low serum Iron 34 and Vit B12>2000 04/17/13-the patient stated she cannot tolerate Fe supplement. Will update Hematology      CHF (congestive heart failure)     Presented with SOB, cough, and BLE edema. BNP 957.4 04/18/13. Adding Furosemide 4m po daily. Update BMP next lab. Update Cardiology. Continue TED    DEPRESSION     Anxious. Lorazepam 0.561mq6hr prn needed. Continue Trazodone 2579mhs    GERD     Continue with PPI(previously taking Ranitidine). C/o nausea-Zofran prn available to her. Amylase and Lipase unremarkable. 49/44 04/15/13     HYPERTENSION     Controlled. Takes Metoprolol(previously taking Losartan and Amlodipine)      PNA (pneumonia) - Primary     Presented with cough and SOB-CXR 04/16/13 showd probable bilateral lower lobe infiltrates of pneumonia-Avelox 400m15mily for 10 days started 04/17/13 along with FloraStor, Robitussin, and Neb. Observe the patient. CHF also contributed to her respiratory symptoms.     RENAL INSUFFICIENCY     Creatinine 1.89 04/15/13 and will update BMP since Lasix  added.     Unspecified constipation     Managed with daily MiraLax and Senna II qhs.       Unspecified hypothyroidism     New, TSH 14.834. Will start Levothyroxine 50mc68m daily, update TSH in 6 weeks. US thKoreaoid pending. ? AR of  Amiodarone         Review of Systems:  Review of Systems  Constitutional: Positive for malaise/fatigue. Negative for fever, chills and weight loss.       Generalized weakness.   HENT: Positive for hearing loss. Negative for congestion, ear discharge, ear pain, nosebleeds, sore throat and tinnitus.   Eyes: Negative for blurred vision, double vision, photophobia, pain, discharge and redness.  Respiratory: Negative for cough, hemoptysis, sputum production, shortness of breath, wheezing and stridor.   Cardiovascular: Positive for leg swelling. Negative for chest pain, palpitations, orthopnea and claudication.       Worsened edema in BLE  Gastrointestinal: Positive for nausea. Negative for heartburn, vomiting, abdominal pain, diarrhea, constipation, blood in stool and melena.       Nausea is better, but appetite remains poor.   Genitourinary: Negative for dysuria, urgency, frequency, hematuria and flank pain.  Musculoskeletal: Negative for back pain, falls, joint pain, myalgias and neck pain.  Skin: Negative for itching and rash.  Neurological: Positive for weakness. Negative for dizziness, tingling, tremors, sensory change, speech change, focal weakness, seizures, loss of consciousness and headaches.  Endo/Heme/Allergies:  Negative for environmental allergies and polydipsia. Does not bruise/bleed easily.  Psychiatric/Behavioral: Positive for memory loss. Negative for depression, suicidal ideas, hallucinations and substance abuse. The patient is nervous/anxious. The patient does not have insomnia.      Past Medical History  Diagnosis Date  . Unspecified essential hypertension     Dr [name]  . Other and unspecified hyperlipidemia   . Unspecified disorder  resulting from impaired renal function   . Diverticulosis of colon with hemorrhage   . Unspecified vitamin D deficiency   . Unspecified asthma(493.90)   . Persistent disorder of initiating or maintaining sleep   . Lumbago   . Esophageal reflux   . Diverticulosis of colon (without mention of hemorrhage)   . Depressive disorder, not elsewhere classified   . Anxiety state, unspecified   . Anemia, unspecified   . Unspecified osteomyelitis, site unspecified   . Myocardial infarction   . Thyroid disease    Past Surgical History  Procedure Laterality Date  . Bone marrow biopsy    . Status post right carotid enterectomy    . Partial colectomy  1995    diverticulosis of colon iwth hemorrhage.   . Coronary artery bypass graft  03/25/13    x4. CABS ON PUMP-Surgeon Jomarie Longs.   Social History:   reports that she has never smoked. She has never used smokeless tobacco. She reports that she drinks alcohol. She reports that she does not use illicit drugs.  Family History  Problem Relation Age of Onset  . Hypertension Other   . COPD Mother   . Cancer Father     lymphoma    Medications: Patient's Medications  New Prescriptions   No medications on file  Previous Medications   AMIODARONE (PACERONE) 200 MG TABLET    Take 200 mg by mouth daily.   ASPIRIN 325 MG EC TABLET    Take 325 mg by mouth daily.   ATORVASTATIN (LIPITOR) 40 MG TABLET    TAKE 1 TABLET BY MOUTH EVERY DAY   CHOLECALCIFEROL (EQL VITAMIN D3) 1000 UNITS TABLET    Take 1,000 Units by mouth daily.     DARBEPOETIN (ARANESP, ALB FREE, SURECLICK) 329 JJO/8.4ZY SOLN    Inject 100 mcg into the skin every 8 (eight) weeks.    LEVOTHYROXINE (SYNTHROID, LEVOTHROID) 50 MCG TABLET    Take 50 mcg by mouth daily before breakfast.   METOPROLOL TARTRATE (LOPRESSOR) 25 MG TABLET    Take 12.5 mg by mouth 2 (two) times daily.   OMEPRAZOLE (PRILOSEC) 20 MG CAPSULE    Take 20 mg by mouth daily.   POLYETHYLENE GLYCOL (MIRALAX / GLYCOLAX) PACKET     Take 17 g by mouth daily.   SENNOSIDES-DOCUSATE SODIUM (SENOKOT-S) 8.6-50 MG TABLET    Take 2 tablets by mouth daily.   TRAZODONE (DESYREL) 50 MG TABLET    Take 25 mg by mouth at bedtime.   VITAMIN B-12 (CYANOCOBALAMIN) 1000 MCG TABLET    Take 1,000 mcg by mouth daily.    Modified Medications   No medications on file  Discontinued Medications   No medications on file   Physical Exam: Physical Exam  Constitutional: She is oriented to person, place, and time. She appears well-developed. No distress.  Overweight. Generalized weakness.   HENT:  Head: Normocephalic.  Right Ear: External ear normal.  Left Ear: External ear normal.  Nose: Nose normal.  Mouth/Throat: Oropharynx is clear and moist.  Eyes: Conjunctivae are normal. Pupils are equal, round, and reactive to light.  Right eye exhibits no discharge. Left eye exhibits no discharge.  Neck: Normal range of motion. Neck supple. No JVD present. No tracheal deviation present. No thyromegaly present.  Cardiovascular: Normal rate, regular rhythm and normal heart sounds.   Pulmonary/Chest: No stridor. No respiratory distress. She has no wheezes.  Abdominal: Soft. Bowel sounds are normal. She exhibits no distension and no mass. There is no tenderness. There is no rebound and no guarding.  Musculoskeletal: She exhibits edema. She exhibits no tenderness (LS spine, hips).  2+ edema BLE  Lymphadenopathy:    She has no cervical adenopathy.  Neurological: She is alert and oriented to person, place, and time. She displays normal reflexes. No cranial nerve deficit. She exhibits normal muscle tone. Coordination normal.  Skin: No rash noted. No erythema.  Mid sternum surgical incision healed.   Psychiatric: Her behavior is normal. Judgment and thought content normal. Her mood appears anxious. Her affect is not angry, not blunt, not labile and not inappropriate. Her speech is not delayed, not tangential and not slurred. She is not agitated, not  aggressive, not hyperactive, not slowed, not withdrawn, not actively hallucinating and not combative. Thought content is not paranoid and not delusional. Cognition and memory are impaired. She does not express impulsivity or inappropriate judgment. She exhibits a depressed mood. She expresses no homicidal and no suicidal ideation. She expresses no suicidal plans and no homicidal plans. She is communicative. She exhibits abnormal recent memory. She is attentive.    Filed Vitals:   04/20/13 1248  BP: 120/64  Pulse: 82  Temp: 97.3 F (36.3 C)  TempSrc: Tympanic  Resp: 18  SpO2: 94%   Labs reviewed: Basic Metabolic Panel:  Recent Labs  06/29/12 1143 10/01/12 0926 12/14/12 1329 04/15/13  NA 138 137 137 138  K 4.4 4.1 3.9 3.8  CL 105 104  --   --   CO2 '25 25 27  ' --   GLUCOSE 97 95 125  --   BUN 41.4* 35* 33.3* 16  CREATININE 2.0* 1.8* 1.7* 1.9*  CALCIUM 9.3 9.2 9.1  --   TSH  --  4.98  --  14.83*   Liver Function Tests:  Recent Labs  06/29/12 1143 10/01/12 0926 12/14/12 1329 04/15/13  AST '25 26 24 20  ' ALT '16 19 15 12  ' ALKPHOS 94 81 90 74  BILITOT 0.60 0.7 0.41  --   PROT 7.0 6.8 6.7  --   ALBUMIN 3.5 3.6 3.4*  --    CBC:  Recent Labs  10/19/12 1056 12/14/12 1329 02/08/13 1016 04/15/13  WBC 6.5 6.8 6.0 5.1  NEUTROABS 3.5 4.3 3.5  --   HGB 10.3* 10.4* 11.3* 8.2*  HCT 29.8* 30.8* 33.7* 24*  MCV 89.6 93.2 93.2  --   PLT 378 370 350 501*   Lipid Panel:  Recent Labs  10/01/12 0926 04/15/13  CHOL 155 92  HDL 56.60 40  LDLCALC 80 30  TRIG 92.0 62  CHOLHDL 3  --     Past Procedures:  03/01/13 chst CTA no evidence of acugte aortic pathology. Subendocardial low attenuation along the anteroapical left ventricle, age indeterminate, with associated myocardial thinning, osteopenia with numerous anterior compression fractures in teh visualized thoracic spine, these area age indeterminate without prior imagine for comparison, mild dependent interlobular septal  thickening which could be related to early interstitial pulmonary edema.   03/02/13 Echocardiogram: EF 45-50%. There are regional wall motion abnormalities. Trace aortic regurgitation, trace mitral regurgitation, trace pulmonic valvular regurgitation, trace  tricuspid regurgitation.   03/03/13 Pulmonary function test: FVC 1.65, FEV1 1.35, FEV1/FVC 81, TLC 3.66   03/25/13 ECG SR. T wave abnormality consider anteolateral ischemia   04/16/13 CXR probable bilateral lower lobe infiltrates of pneumonia.   Assessment/Plan A-fib Rate controlled on amiodarone. EKG 04/18/13 showed QT/QTc 416/500-will update her Cardiology.   Anemia in chronic renal disease continued with B12. Hx of Aranesp inj(f/u q2 months at cancer center) and blood transfusion. Hgb 8.2 04/15/13 and 8.3 04/17/13. Low serum Iron 34 and Vit B12>2000 04/17/13-the patient stated she cannot tolerate Fe supplement. Will update Hematology    DEPRESSION Anxious. Lorazepam 0.58m q6hr prn needed. Continue Trazodone 270mqhs  GERD Continue with PPI(previously taking Ranitidine). C/o nausea-Zofran prn available to her. Amylase and Lipase unremarkable. 49/44 04/15/13   HYPERTENSION Controlled. Takes Metoprolol(previously taking Losartan and Amlodipine)    RENAL INSUFFICIENCY Creatinine 1.89 04/15/13 and will update BMP since Lasix added.   Unspecified constipation Managed with daily MiraLax and Senna II qhs.     CHF (congestive heart failure) Presented with SOB, cough, and BLE edema. BNP 957.4 04/18/13. Adding Furosemide 2027mo daily. Update BMP next lab. Update Cardiology. Continue TED  PNA (pneumonia) Presented with cough and SOB-CXR 04/16/13 showd probable bilateral lower lobe infiltrates of pneumonia-Avelox 400m41mily for 10 days started 04/17/13 along with FloraStor, Robitussin, and Neb. Observe the patient. CHF also contributed to her respiratory symptoms.   Unspecified hypothyroidism New, TSH 14.834. Will start Levothyroxine 50mc62mo daily, update TSH in 6 weeks. US thKoreaoid pending. ? AR of  Amiodarone      Family/ Staff Communication: observe the patient.   Goals of Care: [place]  Labs/tests ordered: BMP. TSH in 6 weeks.

## 2013-04-20 NOTE — Assessment & Plan Note (Signed)
Managed with daily MiraLax and Senna II qhs.

## 2013-04-20 NOTE — Assessment & Plan Note (Signed)
Anxious. Lorazepam 0.5mg  q6hr prn needed. Continue Trazodone 25mg  qhs

## 2013-04-20 NOTE — Assessment & Plan Note (Signed)
Creatinine 1.89 04/15/13 and will update BMP since Lasix added.

## 2013-04-20 NOTE — Assessment & Plan Note (Addendum)
Presented with SOB, cough, and BLE edema. BNP 957.4 04/18/13. Adding Furosemide 20mg  po daily. Update BMP next lab. Update Cardiology. Continue TED. Weight daily.

## 2013-04-20 NOTE — Assessment & Plan Note (Signed)
continued with B12. Hx of Aranesp inj(f/u q2 months at cancer center) and blood transfusion. Hgb 8.2 04/15/13 and 8.3 04/17/13. Low serum Iron 34 and Vit B12>2000 04/17/13-the patient stated she cannot tolerate Fe supplement. Will update Hematology

## 2013-04-20 NOTE — Assessment & Plan Note (Signed)
Controlled. Takes Metoprolol(previously taking Losartan and Amlodipine)

## 2013-04-20 NOTE — Assessment & Plan Note (Signed)
Continue with PPI(previously taking Ranitidine). C/o nausea-Zofran prn available to her. Amylase and Lipase unremarkable. 49/44 04/15/13

## 2013-04-21 ENCOUNTER — Ambulatory Visit: Payer: Self-pay

## 2013-04-21 ENCOUNTER — Other Ambulatory Visit: Payer: Self-pay

## 2013-04-22 ENCOUNTER — Other Ambulatory Visit (HOSPITAL_BASED_OUTPATIENT_CLINIC_OR_DEPARTMENT_OTHER): Payer: Medicare Other

## 2013-04-22 ENCOUNTER — Ambulatory Visit (HOSPITAL_BASED_OUTPATIENT_CLINIC_OR_DEPARTMENT_OTHER): Payer: Medicare Other

## 2013-04-22 VITALS — BP 124/61 | HR 75 | Temp 98.6°F

## 2013-04-22 DIAGNOSIS — D631 Anemia in chronic kidney disease: Secondary | ICD-10-CM

## 2013-04-22 DIAGNOSIS — N039 Chronic nephritic syndrome with unspecified morphologic changes: Secondary | ICD-10-CM | POA: Diagnosis not present

## 2013-04-22 DIAGNOSIS — N189 Chronic kidney disease, unspecified: Secondary | ICD-10-CM

## 2013-04-22 LAB — CBC WITH DIFFERENTIAL/PLATELET
BASO%: 0.7 % (ref 0.0–2.0)
Basophils Absolute: 0 10*3/uL (ref 0.0–0.1)
EOS%: 2.6 % (ref 0.0–7.0)
Eosinophils Absolute: 0.2 10*3/uL (ref 0.0–0.5)
HEMATOCRIT: 25.3 % — AB (ref 34.8–46.6)
HGB: 8.1 g/dL — ABNORMAL LOW (ref 11.6–15.9)
LYMPH#: 1 10*3/uL (ref 0.9–3.3)
LYMPH%: 17.4 % (ref 14.0–49.7)
MCH: 28.2 pg (ref 25.1–34.0)
MCHC: 32 g/dL (ref 31.5–36.0)
MCV: 88.2 fL (ref 79.5–101.0)
MONO#: 0.7 10*3/uL (ref 0.1–0.9)
MONO%: 11.2 % (ref 0.0–14.0)
NEUT%: 68.1 % (ref 38.4–76.8)
NEUTROS ABS: 3.9 10*3/uL (ref 1.5–6.5)
NRBC: 0 % (ref 0–0)
PLATELETS: 413 10*3/uL — AB (ref 145–400)
RBC: 2.87 10*6/uL — AB (ref 3.70–5.45)
RDW: 16.3 % — ABNORMAL HIGH (ref 11.2–14.5)
WBC: 5.8 10*3/uL (ref 3.9–10.3)

## 2013-04-22 LAB — BASIC METABOLIC PANEL
BUN: 15 mg/dL (ref 4–21)
Creatinine: 2.3 mg/dL — AB (ref 0.5–1.1)
Glucose: 82 mg/dL
Potassium: 4 mmol/L (ref 3.4–5.3)
Sodium: 139 mmol/L (ref 137–147)

## 2013-04-22 LAB — CBC AND DIFFERENTIAL
HEMATOCRIT: 25 % — AB (ref 36–46)
Hemoglobin: 8.1 g/dL — AB (ref 12.0–16.0)
Platelets: 413 10*3/uL — AB (ref 150–399)
WBC: 5.8 10^3/mL

## 2013-04-22 MED ORDER — DARBEPOETIN ALFA-POLYSORBATE 200 MCG/0.4ML IJ SOLN
200.0000 ug | Freq: Once | INTRAMUSCULAR | Status: AC
  Administered 2013-04-22: 200 ug via SUBCUTANEOUS
  Filled 2013-04-22: qty 0.4

## 2013-04-22 NOTE — Progress Notes (Signed)
Mrs Pinera here for labs and Aranesp injection.  Hemoglobin is 8.1 today.  She has not been to the office for an injection since January.  She had a heart attack and quadruple bypass surgery in February.  She has returned to [place] just recently and is doing rehab.  Is getting stronger and healing well.  Is feeling better according to her.   Aranesp 200 mcg given as ordered.

## 2013-04-27 ENCOUNTER — Ambulatory Visit: Payer: Self-pay | Admitting: Internal Medicine

## 2013-04-27 ENCOUNTER — Non-Acute Institutional Stay (SKILLED_NURSING_FACILITY): Payer: Medicare Other | Admitting: Nurse Practitioner

## 2013-04-27 ENCOUNTER — Encounter: Payer: Self-pay | Admitting: Nurse Practitioner

## 2013-04-27 DIAGNOSIS — J189 Pneumonia, unspecified organism: Secondary | ICD-10-CM

## 2013-04-27 DIAGNOSIS — K5900 Constipation, unspecified: Secondary | ICD-10-CM

## 2013-04-27 DIAGNOSIS — I4891 Unspecified atrial fibrillation: Secondary | ICD-10-CM

## 2013-04-27 DIAGNOSIS — F411 Generalized anxiety disorder: Secondary | ICD-10-CM

## 2013-04-27 DIAGNOSIS — N039 Chronic nephritic syndrome with unspecified morphologic changes: Secondary | ICD-10-CM

## 2013-04-27 DIAGNOSIS — D631 Anemia in chronic kidney disease: Secondary | ICD-10-CM

## 2013-04-27 DIAGNOSIS — E041 Nontoxic single thyroid nodule: Secondary | ICD-10-CM | POA: Diagnosis not present

## 2013-04-27 DIAGNOSIS — N189 Chronic kidney disease, unspecified: Secondary | ICD-10-CM

## 2013-04-27 DIAGNOSIS — I10 Essential (primary) hypertension: Secondary | ICD-10-CM

## 2013-04-27 DIAGNOSIS — N259 Disorder resulting from impaired renal tubular function, unspecified: Secondary | ICD-10-CM

## 2013-04-27 DIAGNOSIS — K219 Gastro-esophageal reflux disease without esophagitis: Secondary | ICD-10-CM

## 2013-04-27 DIAGNOSIS — I509 Heart failure, unspecified: Secondary | ICD-10-CM

## 2013-04-27 DIAGNOSIS — E039 Hypothyroidism, unspecified: Secondary | ICD-10-CM

## 2013-04-27 DIAGNOSIS — G4700 Insomnia, unspecified: Secondary | ICD-10-CM

## 2013-04-27 NOTE — Assessment & Plan Note (Signed)
continued with B12. Hx of Aranesp inj(f/u q2 months at cancer center) and blood transfusion. Hgb 8.2 04/15/13 and 8.3 04/17/13. Low serum Iron 34 and Vit B12>2000 04/17/13-Hgb 8.1 04/22/13-Aranesp 223mcg inj 04/22/13. Next inj 05/31/13

## 2013-04-27 NOTE — Assessment & Plan Note (Signed)
New, TSH 14.834.  Takes Levothyroxine 90mcg po daily, update TSH in 6 weeks. ? AR of  Amiodarone

## 2013-04-27 NOTE — Assessment & Plan Note (Signed)
Managed with daily MiraLax and Senna II qhs.

## 2013-04-27 NOTE — Assessment & Plan Note (Signed)
Continue with PPI(previously taking Ranitidine). C/o nausea-Zofran prn available to her. Amylase and Lipase unremarkable. 49/44 04/15/13

## 2013-04-27 NOTE — Assessment & Plan Note (Signed)
Improved cough and SOB-CXR 04/16/13 showd probable bilateral lower lobe infiltrates of pneumonia-fully treated with Avelox 400mg  daily for 10 days started 04/17/13 along with FloraStor, Robitussin, and Neb. Observe the patient. CHF also contributed to her respiratory symptoms.

## 2013-04-27 NOTE — Assessment & Plan Note (Signed)
Controlled. Takes Metoprolol(previously taking Losartan and Amlodipine)

## 2013-04-27 NOTE — Assessment & Plan Note (Signed)
Rate controlled on amiodarone. EKG 04/18/13 showed QT/QTc 416/500-f/u Cardiology.

## 2013-04-27 NOTE — Assessment & Plan Note (Signed)
C/o not sleep/rest well at night, poor appetite. Will dc Trazodone. Try Mirtazapine 15mg  qhs. The patient's POA declined Requip or Benzodiazepines.

## 2013-04-27 NOTE — Assessment & Plan Note (Signed)
Anxious. Lorazepam 0.5mg  q6hr prn needed. Continue Trazodone 25mg  qhs. New-hypothyroidism.

## 2013-04-27 NOTE — Assessment & Plan Note (Addendum)
Creatinine 1.89 04/15/13, worsened since Lasix added. Creat 2.29 04/22/13. Nephrology when the patient desires.

## 2013-04-27 NOTE — Assessment & Plan Note (Signed)
improved SOB, cough, and BLE edema. BNP 957.4 04/18/13. continue Furosemide 20mg  po daily. Bun/creat 15/2.29 04/22/13

## 2013-04-27 NOTE — Progress Notes (Signed)
Patient ID: [name] [name], female   DOB: 12/05/1931, 80 y.o.   MRN: [id number]   Code Status: DNR  Allergies  Allergen Reactions  . Codeine Sulfate Nausea And Vomiting  . Propoxyphene Napsylate Nausea And Vomiting  . Sulfacetamide Sodium-Sulfur     Chief Complaint  Patient presents with  . Medical Managment of Chronic Issues  . Acute Visit    anemia, edema, Thyroid nodule 14m.    HPI: Patient is a 80y.o. 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  Problem List Items Addressed This Visit   A-fib     Rate controlled on amiodarone. EKG 04/18/13 showed QT/QTc 416/500-f/u Cardiology.      Anemia in chronic renal disease     continued with B12. Hx of Aranesp inj(f/u q2 months at cancer center) and blood transfusion. Hgb 8.2 04/15/13 and 8.3 04/17/13. Low serum Iron 34 and Vit B12>2000 04/17/13-Hgb 8.1 04/22/13-Aranesp 2029m inj 04/22/13. Next inj 05/31/13     ANXIETY     Anxious. Lorazepam 0.95m32m6hr prn needed. Continue Trazodone 295m66ms. New-hypothyroidism.    CHF (congestive heart failure)     improved SOB, cough, and BLE edema. BNP 957.4 04/18/13. continue Furosemide 20mg63mdaily. Bun/creat 15/2.29 04/22/13     GERD     Continue with PPI(previously taking Ranitidine). C/o nausea-Zofran prn available to her. Amylase and Lipase unremarkable. 49/44 04/15/13      HYPERTENSION     Controlled. Takes Metoprolol(previously taking Losartan and Amlodipine)    INSOMNIA, PERSISTENT     C/o not sleep/rest well at night, poor appetite. Will dc Trazodone. Try Mirtazapine 195mg 28m The patient's POA declined Requip or Benzodiazepines.     PNA (pneumonia)     Improved cough and SOB-CXR 04/16/13 showd probable bilateral lower lobe infiltrates of pneumonia-fully treated with Avelox 400mg d3m for 10 days started 04/17/13 along with FloraStor, Robitussin, and Neb. Observe the  patient. CHF also contributed to her respiratory symptoms.      RENAL INSUFFICIENCY     Creatinine 1.89 04/15/13, worsened since Lasix added. Creat 2.29 04/22/13. Nephrology when the patient desires.      Thyroid nodule - Primary     Spoke with radiology-recommended yearly US f/u.[name]   Unspecified constipation     Managed with daily MiraLax and Senna II qhs.       Unspecified hypothyroidism     New, TSH 14.834.  Takes Levothyroxine 50mcg p86mily, update TSH in 6 weeks. ? AR of  Amiodarone          Review of Systems:  Review of Systems  Constitutional: Positive for malaise/fatigue. Negative for fever, chills and weight loss.       Generalized weakness.   HENT: Positive for hearing loss. Negative for congestion, ear discharge, ear pain, nosebleeds, sore throat and tinnitus.   Eyes: Negative for blurred vision, double vision, photophobia, pain, discharge and redness.  Respiratory: Positive for cough. Negative for hemoptysis, sputum production, shortness of breath, wheezing and stridor.   Cardiovascular: Positive for leg swelling. Negative for chest pain, palpitations, orthopnea and claudication.       1+ edema in BLE  Gastrointestinal: Positive for nausea. Negative for heartburn, vomiting, abdominal pain, diarrhea, constipation, blood in stool and melena.       Nausea is better, but appetite remains poor.   Genitourinary: Negative for dysuria, urgency, frequency,  hematuria and flank pain.  Musculoskeletal: Negative for back pain, falls, joint pain, myalgias and neck pain.  Skin: Negative for itching and rash.  Neurological: Positive for weakness. Negative for dizziness, tingling, tremors, sensory change, speech change, focal weakness, seizures, loss of consciousness and headaches.       C/o restless legs  Endo/Heme/Allergies: Negative for environmental allergies and polydipsia. Does not bruise/bleed easily.  Psychiatric/Behavioral: Positive for memory loss. Negative for depression,  suicidal ideas, hallucinations and substance abuse. The patient is nervous/anxious and has insomnia.      Past Medical History  Diagnosis Date  . Unspecified essential hypertension     Dr [name]  . Other and unspecified hyperlipidemia   . Unspecified disorder resulting from impaired renal function   . Diverticulosis of colon with hemorrhage   . Unspecified vitamin D deficiency   . Unspecified asthma(493.90)   . Persistent disorder of initiating or maintaining sleep   . Lumbago   . Esophageal reflux   . Diverticulosis of colon (without mention of hemorrhage)   . Depressive disorder, not elsewhere classified   . Anxiety state, unspecified   . Anemia, unspecified   . Unspecified osteomyelitis, site unspecified   . Myocardial infarction   . Thyroid disease    Past Surgical History  Procedure Laterality Date  . Bone marrow biopsy    . Status post right carotid enterectomy    . Partial colectomy  1995    diverticulosis of colon iwth hemorrhage.   . Coronary artery bypass graft  03/25/13    x4. CABS ON PUMP-Surgeon Jomarie Longs.   Social History:   reports that she has never smoked. She has never used smokeless tobacco. She reports that she drinks alcohol. She reports that she does not use illicit drugs.  Family History  Problem Relation Age of Onset  . Hypertension Other   . COPD Mother   . Cancer Father     lymphoma    Medications: Patient's Medications  New Prescriptions   No medications on file  Previous Medications   AMIODARONE (PACERONE) 200 MG TABLET    Take 200 mg by mouth daily.   ASPIRIN 325 MG EC TABLET    Take 325 mg by mouth daily.   ATORVASTATIN (LIPITOR) 40 MG TABLET    TAKE 1 TABLET BY MOUTH EVERY DAY   CHOLECALCIFEROL (EQL VITAMIN D3) 1000 UNITS TABLET    Take 1,000 Units by mouth daily.     DARBEPOETIN (ARANESP, ALB FREE, SURECLICK) 683 MHD/6.2IW SOLN    Inject 100 mcg into the skin every 8 (eight) weeks.    LEVOTHYROXINE (SYNTHROID, LEVOTHROID) 50 MCG  TABLET    Take 50 mcg by mouth daily before breakfast.   METOPROLOL TARTRATE (LOPRESSOR) 25 MG TABLET    Take 12.5 mg by mouth 2 (two) times daily.   OMEPRAZOLE (PRILOSEC) 20 MG CAPSULE    Take 20 mg by mouth daily.   POLYETHYLENE GLYCOL (MIRALAX / GLYCOLAX) PACKET    Take 17 g by mouth daily.   SENNOSIDES-DOCUSATE SODIUM (SENOKOT-S) 8.6-50 MG TABLET    Take 2 tablets by mouth daily.   TRAZODONE (DESYREL) 50 MG TABLET    Take 25 mg by mouth at bedtime.   VITAMIN B-12 (CYANOCOBALAMIN) 1000 MCG TABLET    Take 1,000 mcg by mouth daily.    Modified Medications   No medications on file  Discontinued Medications   No medications on file   Physical Exam: Physical Exam  Constitutional: She is oriented to person,  place, and time. She appears well-developed. No distress.  Overweight. Generalized weakness.   HENT:  Head: Normocephalic.  Right Ear: External ear normal.  Left Ear: External ear normal.  Nose: Nose normal.  Mouth/Throat: Oropharynx is clear and moist.  Eyes: Conjunctivae are normal. Pupils are equal, round, and reactive to light. Right eye exhibits no discharge. Left eye exhibits no discharge.  Neck: Normal range of motion. Neck supple. No JVD present. No tracheal deviation present. No thyromegaly present.  Cardiovascular: Normal rate, regular rhythm and normal heart sounds.   Pulmonary/Chest: No stridor. No respiratory distress. She has no wheezes.  Abdominal: Soft. Bowel sounds are normal. She exhibits no distension and no mass. There is no tenderness. There is no rebound and no guarding.  Musculoskeletal: She exhibits edema. She exhibits no tenderness (LS spine, hips).  1+ edema BLE  Lymphadenopathy:    She has no cervical adenopathy.  Neurological: She is alert and oriented to person, place, and time. She displays normal reflexes. No cranial nerve deficit. She exhibits normal muscle tone. Coordination normal.  Skin: No rash noted. No erythema.  Mid sternum surgical incision  healed.   Psychiatric: Her behavior is normal. Judgment and thought content normal. Her mood appears anxious. Her affect is not angry, not blunt, not labile and not inappropriate. Her speech is not delayed, not tangential and not slurred. She is not agitated, not aggressive, not hyperactive, not slowed, not withdrawn, not actively hallucinating and not combative. Thought content is not paranoid and not delusional. Cognition and memory are impaired. She does not express impulsivity or inappropriate judgment. She exhibits a depressed mood. She expresses no homicidal and no suicidal ideation. She expresses no suicidal plans and no homicidal plans. She is communicative. She exhibits abnormal recent memory. She is attentive.    Filed Vitals:   04/27/13 1450  BP: 112/64  Pulse: 76  Temp: 98.1 F (36.7 C)  TempSrc: Tympanic  Resp: 18   Labs reviewed: Basic Metabolic Panel:  Recent Labs  06/29/12 1143 10/01/12 0926 12/14/12 1329 04/15/13 04/22/13  NA 138 137 137 138 139  K 4.4 4.1 3.9 3.8 4.0  CL 105 104  --   --   --   CO2 '25 25 27  ' --   --   GLUCOSE 97 95 125  --   --   BUN 41.4* 35* 33.3* 16 15  CREATININE 2.0* 1.8* 1.7* 1.9* 2.3*  CALCIUM 9.3 9.2 9.1  --   --   TSH  --  4.98  --  14.83*  --    Liver Function Tests:  Recent Labs  06/29/12 1143 10/01/12 0926 12/14/12 1329 04/15/13  AST '25 26 24 20  ' ALT '16 19 15 12  ' ALKPHOS 94 81 90 74  BILITOT 0.60 0.7 0.41  --   PROT 7.0 6.8 6.7  --   ALBUMIN 3.5 3.6 3.4*  --    CBC:  Recent Labs  12/14/12 1329 02/08/13 1016 04/15/13 04/22/13 04/22/13 1101  WBC 6.8 6.0 5.1 5.8 5.8  NEUTROABS 4.3 3.5  --   --  3.9  HGB 10.4* 11.3* 8.2* 8.1* 8.1 Repeated and Verified*  HCT 30.8* 33.7* 24* 25* 25.3*  MCV 93.2 93.2  --   --  88.2  PLT 370 350 501* 413* 413*   Lipid Panel:  Recent Labs  10/01/12 0926 04/15/13  CHOL 155 92  HDL 56.60 40  LDLCALC 80 30  TRIG 92.0 62  CHOLHDL 3  --  Past Procedures:  03/01/13 chst CTA no  evidence of acugte aortic pathology. Subendocardial low attenuation along the anteroapical left ventricle, age indeterminate, with associated myocardial thinning, osteopenia with numerous anterior compression fractures in teh visualized thoracic spine, these area age indeterminate without prior imagine for comparison, mild dependent interlobular septal thickening which could be related to early interstitial pulmonary edema.   03/02/13 Echocardiogram: EF 45-50%. There are regional wall motion abnormalities. Trace aortic regurgitation, trace mitral regurgitation, trace pulmonic valvular regurgitation, trace tricuspid regurgitation.   03/03/13 Pulmonary function test: FVC 1.65, FEV1 1.35, FEV1/FVC 81, TLC 3.66   03/25/13 ECG SR. T wave abnormality consider anteolateral ischemia   04/16/13 CXR probable bilateral lower lobe infiltrates of pneumonia.   04/20/13 US thyroid: nodular density identified in the right lobe of the thyroid.   Assessment/Plan Thyroid nodule Spoke with radiology-recommended yearly [name] f/u.   Unspecified hypothyroidism New, TSH 14.834.  Takes Levothyroxine 754mg po daily, update TSH in 6 weeks. ? AR of  Amiodarone     A-fib Rate controlled on amiodarone. EKG 04/18/13 showed QT/QTc 416/500-f/u Cardiology.    Anemia in chronic renal disease continued with B12. Hx of Aranesp inj(f/u q2 months at cancer center) and blood transfusion. Hgb 8.2 04/15/13 and 8.3 04/17/13. Low serum Iron 34 and Vit B12>2000 04/17/13-Hgb 8.1 04/22/13-Aranesp 2064m inj 04/22/13. Next inj 05/31/13   ANXIETY Anxious. Lorazepam 0.54m454m6hr prn needed. Continue Trazodone 254m21ms. New-hypothyroidism.  CHF (congestive heart failure) improved SOB, cough, and BLE edema. BNP 957.4 04/18/13. continue Furosemide 20mg83mdaily. Bun/creat 15/2.29 04/22/13   GERD Continue with PPI(previously taking Ranitidine). C/o nausea-Zofran prn available to her. Amylase and Lipase unremarkable. 49/44  04/15/13    HYPERTENSION Controlled. Takes Metoprolol(previously taking Losartan and Amlodipine)  PNA (pneumonia) Improved cough and SOB-CXR 04/16/13 showd probable bilateral lower lobe infiltrates of pneumonia-fully treated with Avelox 400mg 73my for 10 days started 04/17/13 along with FloraStor, Robitussin, and Neb. Observe the patient. CHF also contributed to her respiratory symptoms.    Unspecified constipation Managed with daily MiraLax and Senna II qhs.     RENAL INSUFFICIENCY Creatinine 1.89 04/15/13, worsened since Lasix added. Creat 2.29 04/22/13. Nephrology when the patient desires.    INSOMNIA, PERSISTENT C/o not sleep/rest well at night, poor appetite. Will dc Trazodone. Try Mirtazapine 154mg q36mThe patient's POA declined Requip or Benzodiazepines.     Family/ Staff Communication: observe the patient.   Goals of Care: [place]  Labs/tests ordered: TSH in 6 weeks.

## 2013-04-27 NOTE — Assessment & Plan Note (Signed)
Spoke with radiology-recommended yearly [name] f/u.

## 2013-04-28 ENCOUNTER — Telehealth: Payer: Self-pay | Admitting: Internal Medicine

## 2013-04-28 NOTE — Telephone Encounter (Signed)
Relevant patient education mailed to patient.  

## 2013-04-30 ENCOUNTER — Non-Acute Institutional Stay (SKILLED_NURSING_FACILITY): Payer: Medicare Other | Admitting: Internal Medicine

## 2013-04-30 ENCOUNTER — Encounter: Payer: Self-pay | Admitting: Nurse Practitioner

## 2013-04-30 ENCOUNTER — Encounter: Payer: Self-pay | Admitting: Internal Medicine

## 2013-04-30 DIAGNOSIS — G589 Mononeuropathy, unspecified: Secondary | ICD-10-CM

## 2013-04-30 DIAGNOSIS — I2510 Atherosclerotic heart disease of native coronary artery without angina pectoris: Secondary | ICD-10-CM | POA: Diagnosis not present

## 2013-04-30 DIAGNOSIS — K219 Gastro-esophageal reflux disease without esophagitis: Secondary | ICD-10-CM

## 2013-04-30 DIAGNOSIS — J111 Influenza due to unidentified influenza virus with other respiratory manifestations: Secondary | ICD-10-CM | POA: Diagnosis not present

## 2013-04-30 DIAGNOSIS — G629 Polyneuropathy, unspecified: Secondary | ICD-10-CM

## 2013-04-30 DIAGNOSIS — J101 Influenza due to other identified influenza virus with other respiratory manifestations: Secondary | ICD-10-CM

## 2013-04-30 DIAGNOSIS — I219 Acute myocardial infarction, unspecified: Secondary | ICD-10-CM

## 2013-04-30 DIAGNOSIS — I10 Essential (primary) hypertension: Secondary | ICD-10-CM | POA: Diagnosis not present

## 2013-04-30 DIAGNOSIS — I214 Non-ST elevation (NSTEMI) myocardial infarction: Secondary | ICD-10-CM

## 2013-04-30 DIAGNOSIS — R5381 Other malaise: Secondary | ICD-10-CM

## 2013-04-30 DIAGNOSIS — G3184 Mild cognitive impairment, so stated: Secondary | ICD-10-CM

## 2013-04-30 NOTE — Progress Notes (Signed)
Patient ID: [name] [name], female   DOB: [birth year], 80 y.o.   MRN: [id number]    Location:  [facility]   Place of Service: SNF (31)  PCP: [name], MD  Code Status: LIVING WILL, MOST, HCPOA  Extended Emergency Contact Information Primary Emergency Contact: Sandria Manly, [place] Phone: [phone] Mobile Phone: [phone] Relation: Daughter  Allergies  Allergen Reactions  . Codeine Sulfate Nausea And Vomiting  . Propoxyphene Napsylate Nausea And Vomiting  . Sulfacetamide Sodium-Sulfur   . Sulfamethoxazole-Trimethoprim Nausea Only    Can't remember reaction  . Zolpidem     Other reaction(s): Mental Status Changes (intolerance)    Chief Complaint  Patient presents with  . New Evaluation    new admission to SNF on 04/08/13 following hospitalization    HPI:  New admission to SNF on 04/08/13 following hospitalization. Hospitalized at [facility] hospital 03/24/13-04/08/13 for CAD and CABG x4 03/25/13. She was admitted following a syncopal episode when she was at the hospital for HTN study at the [facility]. She had a racing heart and left chest pain radiated to her back when she arrived at ED. CTA ruled out dissection. EKG with ST depression in V3-V6. Troponin was elevated. Cardiac Cath showed significant stenosis in the LAD, circumflex, borderline disease in the RCA.  Her CABG was postponed x1 day due to her Flu symptoms. CABG x4 03/25/13. Developed Afib with RVR on POD#4. On 03/28/13, Amiodarone was started and she converted to NSR  On today's exam she is having increased pain in the right shoulder. She thinks she caught her right arm in a railling in her bed. Currently has an ice pack in place.     Past Medical History  Diagnosis Date  . Unspecified essential hypertension     Dr [name]  . Other and unspecified hyperlipidemia   . Unspecified disorder resulting from impaired renal function   . Diverticulosis  of colon with hemorrhage   . Unspecified vitamin D deficiency   . Unspecified asthma(493.90)   . Persistent disorder of initiating or maintaining sleep   . Lumbago   . Esophageal reflux   . Diverticulosis of colon (without mention of hemorrhage)   . Depressive disorder, not elsewhere classified   . Anxiety state, unspecified   . Anemia, unspecified   . Unspecified osteomyelitis, site unspecified   . Myocardial infarction   . Thyroid disease     Past Surgical History  Procedure Laterality Date  . Bone marrow biopsy    . Status post right carotid enterectomy    . Partial colectomy  1995    diverticulosis of colon iwth hemorrhage.   . Coronary artery bypass graft  03/25/13    x4. CABS ON PUMP-Surgeon Jomarie Longs.    CONSULTANTS Cardiology: [name] ENT: Zella Richer Hematology: Murinson GI: Medoff  PAST PROCEDURES 07/08/12 ultrasound abdomen: Normal 07/02/12 lumbar spine: Multiple advanced lumbar disc degeneration. Chronic compression fractures of T12, L3, and L4 10/30/12 carotid Doppler: Left ICA 60-79% stenosis.  04/11/08 CT head: Atrophy and chronic small vessel disease 03/01/13 chest CTA no evidence of acute aortic pathology. Subendocardial low attenuation along the anteroapical left ventricle, age indeterminate, with associated myocardial thinning, osteopenia with numerous anterior compression fractures in teh visualized thoracic spine, these area age indeterminate without prior imagine for comparison, mild dependent interlobular septal thickening which could be related to early interstitial pulmonary edema.   03/02/13 Echocardiogram: EF 45-50%. There are  regional wall motion abnormalities. Trace aortic regurgitation, trace mitral regurgitation, trace pulmonic valvular regurgitation, trace tricuspid regurgitation.   03/03/13 Pulmonary function test: FVC 1.65, FEV1 1.35, FEV1/FVC 81, TLC 3.66  03/25/13 ECG SR. T wave abnormality consider anteolateral ischemia    Social History: History     Social History  . Marital Status: Divorced    Spouse Name: N/A    Number of Children: N/A  . Years of Education: N/A   Social History Main Topics  . Smoking status: Never Smoker   . Smokeless tobacco: Never Used  . Alcohol Use: 0.0 oz/week    1-2 Shots of liquor per week  . Drug Use: No  . Sexual Activity: None   Other Topics Concern  . None   Social History Narrative  . None    Family History Family Status  Relation Status Death Age  . Other Alive   . Mother Deceased   . Father Deceased    Family History  Problem Relation Age of Onset  . Hypertension Other   . COPD Mother   . Cancer Father     lymphoma     Medications: Patient's Medications  New Prescriptions   No medications on file  Previous Medications   AMIODARONE (PACERONE) 200 MG TABLET    Take 200 mg by mouth daily.   ASPIRIN 325 MG EC TABLET    Take 325 mg by mouth daily.   ATORVASTATIN (LIPITOR) 40 MG TABLET    TAKE 1 TABLET BY MOUTH EVERY DAY   CHOLECALCIFEROL (EQL VITAMIN D3) 1000 UNITS TABLET    Take 1,000 Units by mouth daily.     DARBEPOETIN (ARANESP, ALB FREE, SURECLICK) 161 WRU/0.4VW SOLN    Inject 100 mcg into the skin every 8 (eight) weeks.    LEVOTHYROXINE (SYNTHROID, LEVOTHROID) 50 MCG TABLET    Take 50 mcg by mouth daily before breakfast.   METOPROLOL TARTRATE (LOPRESSOR) 25 MG TABLET    Take 12.5 mg by mouth 2 (two) times daily.   OMEPRAZOLE (PRILOSEC) 20 MG CAPSULE    Take 20 mg by mouth daily.   POLYETHYLENE GLYCOL (MIRALAX / GLYCOLAX) PACKET    Take 17 g by mouth daily.   SENNOSIDES-DOCUSATE SODIUM (SENOKOT-S) 8.6-50 MG TABLET    Take 2 tablets by mouth daily.   TRAZODONE (DESYREL) 50 MG TABLET    Take 25 mg by mouth at bedtime.   VITAMIN B-12 (CYANOCOBALAMIN) 1000 MCG TABLET    Take 1,000 mcg by mouth daily.    Modified Medications   No medications on file  Discontinued Medications   No medications on file    Immunization History  Administered Date(s) Administered  . H1N1  12/28/2007  . Influenza Split 10/22/2011  . Pneumococcal Polysaccharide-23 07/01/2012     Review of Systems  Constitutional: Positive for malaise/fatigue. Negative for fever, chills and weight loss.        Generalized weakness.   HENT: Positive for hearing loss. Negative for congestion, ear discharge, ear pain, nosebleeds, sore throat and tinnitus.   Eyes: Negative for blurred vision, double vision, photophobia, pain, discharge and redness.  Respiratory: Negative for cough, hemoptysis, sputum production, shortness of breath, wheezing and stridor.   Cardiovascular: Positive for leg swelling. Negative for chest pain, palpitations, orthopnea and claudication.        Trace edema in BLE  Gastrointestinal: Positive for nausea. Negative for heartburn, vomiting, abdominal pain, diarrhea, constipation, blood in stool and melena.  Genitourinary: Negative for dysuria, urgency, frequency, hematuria and  flank pain.  Musculoskeletal: Negative for back pain,and neck pain.  Has pain in the right shoulder. Difficult to abduct and rotate at the shoulder.  Skin: Negative for itching and rash.  Neurological: Positive for weakness. Negative for dizziness, tingling, tremors, sensory change, speech change, focal weakness, seizures, loss of consciousness and headaches.  Endo/Heme/Allergies: Negative for environmental allergies and polydipsia. Does not bruise/bleed easily.  Psychiatric/Behavioral: Positive for memory loss. Negative for depression, suicidal ideas, hallucinations and substance abuse. The patient is nervous/anxious. The patient does not have insomnia.      Filed Vitals:   04/30/13 1551  BP: 134/67  Pulse: 82  Temp: 99.3 F (37.4 C)  Resp: 20  Height: _0  (1.6 m)  Weight: 190 lb (86.183 kg)  SpO2: 94%   Physical Exam Physical Exam  Constitutional: She is oriented to person, place, and time. She appears well-developed. No distress.  Overweight. Generalized weakness.   HENT:   Head:  Normocephalic.  Right Ear: External ear normal.  Left Ear: External ear normal.   Nose: Nose normal.   Mouth/Throat: Oropharynx is clear and moist.  Eyes: Conjunctivae are normal. Pupils are equal, round, and reactive to light. Right eye exhibits no discharge. Left eye exhibits no discharge.  Neck: Normal range of motion. Neck supple. No JVD present. No tracheal deviation present. No thyromegaly present.  Cardiovascular: Normal rate, regular rhythm and normal heart sounds.   Pulmonary/Chest: No stridor. No respiratory distress. She has no wheezes.  Abdominal: Soft. Bowel sounds are normal. She exhibits no distension and no mass. There is no tenderness. There is no rebound and no guarding.  Musculoskeletal: She exhibits edema. She exhibits no tenderness at the LS spine, hips. There is tenderness and discomfort in the right shoulder. She has difficulty moving the shoulder to abduct or. Neuro: Cranial nerves intact. No focal weakness. Normal sensation in feet. Psych: oriented and not delusional. Cognition and memory are impaired. She does not express impulsivity or inappropriate judgment. She exhibits a depressed mood. She expresses no homicidal and no suicidal ideation. She expresses no suicidal plans and no homicidal plans.      Labs reviewed: Nursing Home on 04/27/2013  Component Date Value Ref Range Status  . Glucose 04/22/2013 82   Final  . BUN 04/22/2013 15  4 - 21 mg/dL Final  . Creatinine 04/22/2013 2.3* 0.5 - 1.1 mg/dL Final  . Potassium 04/22/2013 4.0  3.4 - 5.3 mmol/L Final  . Sodium 04/22/2013 139  137 - 147 mmol/L Final  . Hemoglobin 04/22/2013 8.1* 12.0 - 16.0 g/dL Final  . HCT 04/22/2013 25* 36 - 46 % Final  . Platelets 04/22/2013 413* 150 - 399 K/L Final  . WBC 04/22/2013 5.8   Final  Appointment on 04/22/2013  Component Date Value Ref Range Status  . WBC 04/22/2013 5.8  3.9 - 10.3 10e3/uL Final  . NEUT# 04/22/2013 3.9  1.5 - 6.5 10e3/uL Final  . HGB 04/22/2013 8.1  Repeated and Verified* 11.6 - 15.9 g/dL Final  . HCT 04/22/2013 25.3* 34.8 - 46.6 % Final  . Platelets 04/22/2013 413* 145 - 400 10e3/uL Final  . MCV 04/22/2013 88.2  79.5 - 101.0 fL Final  . MCH 04/22/2013 28.2  25.1 - 34.0 pg Final  . MCHC 04/22/2013 32.0  31.5 - 36.0 g/dL Final  . RBC 04/22/2013 2.87* 3.70 - 5.45 10e6/uL Final  . RDW 04/22/2013 16.3* 11.2 - 14.5 % Final  . lymph# 04/22/2013 1.0  0.9 - 3.3 10e3/uL Final  .  MONO# 04/22/2013 0.7  0.1 - 0.9 10e3/uL Final  . Eosinophils Absolute 04/22/2013 0.2  0.0 - 0.5 10e3/uL Final  . Basophils Absolute 04/22/2013 0.0  0.0 - 0.1 10e3/uL Final  . NEUT% 04/22/2013 68.1  38.4 - 76.8 % Final  . LYMPH% 04/22/2013 17.4  14.0 - 49.7 % Final  . MONO% 04/22/2013 11.2  0.0 - 14.0 % Final  . EOS% 04/22/2013 2.6  0.0 - 7.0 % Final  . BASO% 04/22/2013 0.7  0.0 - 2.0 % Final  . nRBC 04/22/2013 0  0 - 0 % Final  Nursing Home on 04/16/2013  Component Date Value Ref Range Status  . Hemoglobin 04/15/2013 8.2* 12.0 - 16.0 g/dL Final  . HCT 04/15/2013 24* 36 - 46 % Final  . Platelets 04/15/2013 501* 150 - 399 K/L Final  . WBC 04/15/2013 5.1   Final  . Glucose 04/15/2013 89   Final  . BUN 04/15/2013 16  4 - 21 mg/dL Final  . Creatinine 04/15/2013 1.9* 0.5 - 1.1 mg/dL Final  . Potassium 04/15/2013 3.8  3.4 - 5.3 mmol/L Final  . Sodium 04/15/2013 138  137 - 147 mmol/L Final  . Triglycerides 04/15/2013 62  40 - 160 mg/dL Final  . Cholesterol 04/15/2013 92  0 - 200 mg/dL Final  . HDL 04/15/2013 40  35 - 70 mg/dL Final  . LDL Cholesterol 04/15/2013 30   Final  . Alkaline Phosphatase 04/15/2013 74  25 - 125 U/L Final  . ALT 04/15/2013 12  7 - 35 U/L Final  . AST 04/15/2013 20  13 - 35 U/L Final  . Bilirubin, Total 04/15/2013 0.7   Final  . TSH 04/15/2013 14.83* 0.41 - 5.90 uIU/mL Final  Appointment on 02/08/2013  Component Date Value Ref Range Status  . WBC 02/08/2013 6.0  3.9 - 10.3 10e3/uL Final  . NEUT# 02/08/2013 3.5  1.5 - 6.5 10e3/uL Final  .  HGB 02/08/2013 11.3* 11.6 - 15.9 g/dL Final  . HCT 02/08/2013 33.7* 34.8 - 46.6 % Final  . Platelets 02/08/2013 350  145 - 400 10e3/uL Final  . MCV 02/08/2013 93.2  79.5 - 101.0 fL Final  . MCH 02/08/2013 31.3  25.1 - 34.0 pg Final  . MCHC 02/08/2013 33.5  31.5 - 36.0 g/dL Final  . RBC 02/08/2013 3.61* 3.70 - 5.45 10e6/uL Final  . RDW 02/08/2013 13.9  11.2 - 14.5 % Final  . lymph# 02/08/2013 1.7  0.9 - 3.3 10e3/uL Final  . MONO# 02/08/2013 0.6  0.1 - 0.9 10e3/uL Final  . Eosinophils Absolute 02/08/2013 0.2  0.0 - 0.5 10e3/uL Final  . Basophils Absolute 02/08/2013 0.1  0.0 - 0.1 10e3/uL Final  . NEUT% 02/08/2013 57.5  38.4 - 76.8 % Final  . LYMPH% 02/08/2013 28.5  14.0 - 49.7 % Final  . MONO% 02/08/2013 9.3  0.0 - 14.0 % Final  . EOS% 02/08/2013 3.7  0.0 - 7.0 % Final  . BASO% 02/08/2013 1.0  0.0 - 2.0 % Final     Assessment/Plan CAD: Recovering from recent myocardial infarction and CABG. Still with generalized weakness.  HYPERTENSION: Controlled  Acute non-ST segment elevation myocardial infarction: Recovering  Influenza due to influenza A virus: resolved. May be contributing to residual weakness.  GERD: Some problems and nausea recently seemed to be improving. Likely related to reflux issues.  Neuropathy: Diminished sensation to touch  Minimal cognitive impairment: MMSE should be done in the future  Disuse syndrome: Generalized weakness due to prolonged bedrest. Engaged  in physical therapy and occupational therapy. She was making some progress.

## 2013-04-30 NOTE — Progress Notes (Signed)
This encounter was created in error - please disregard.

## 2013-05-04 ENCOUNTER — Encounter: Payer: Self-pay | Admitting: Nurse Practitioner

## 2013-05-04 ENCOUNTER — Non-Acute Institutional Stay (SKILLED_NURSING_FACILITY): Payer: Medicare Other | Admitting: Nurse Practitioner

## 2013-05-04 DIAGNOSIS — D631 Anemia in chronic kidney disease: Secondary | ICD-10-CM

## 2013-05-04 DIAGNOSIS — K5900 Constipation, unspecified: Secondary | ICD-10-CM

## 2013-05-04 DIAGNOSIS — I4891 Unspecified atrial fibrillation: Secondary | ICD-10-CM | POA: Diagnosis not present

## 2013-05-04 DIAGNOSIS — K219 Gastro-esophageal reflux disease without esophagitis: Secondary | ICD-10-CM

## 2013-05-04 DIAGNOSIS — I2510 Atherosclerotic heart disease of native coronary artery without angina pectoris: Secondary | ICD-10-CM

## 2013-05-04 DIAGNOSIS — I509 Heart failure, unspecified: Secondary | ICD-10-CM

## 2013-05-04 DIAGNOSIS — N259 Disorder resulting from impaired renal tubular function, unspecified: Secondary | ICD-10-CM | POA: Diagnosis not present

## 2013-05-04 DIAGNOSIS — F411 Generalized anxiety disorder: Secondary | ICD-10-CM | POA: Diagnosis not present

## 2013-05-04 DIAGNOSIS — N189 Chronic kidney disease, unspecified: Secondary | ICD-10-CM

## 2013-05-04 DIAGNOSIS — G2581 Restless legs syndrome: Secondary | ICD-10-CM

## 2013-05-04 DIAGNOSIS — N039 Chronic nephritic syndrome with unspecified morphologic changes: Secondary | ICD-10-CM | POA: Diagnosis not present

## 2013-05-04 DIAGNOSIS — E039 Hypothyroidism, unspecified: Secondary | ICD-10-CM

## 2013-05-04 DIAGNOSIS — F329 Major depressive disorder, single episode, unspecified: Secondary | ICD-10-CM

## 2013-05-04 DIAGNOSIS — F3289 Other specified depressive episodes: Secondary | ICD-10-CM

## 2013-05-04 DIAGNOSIS — G4700 Insomnia, unspecified: Secondary | ICD-10-CM

## 2013-05-04 NOTE — Assessment & Plan Note (Signed)
Creatinine 1.89 04/15/13, worsened since Lasix added. Creat 2.29 04/22/13. Nephrology when the patient desires. Update BMP next week.

## 2013-05-04 NOTE — Assessment & Plan Note (Signed)
New, TSH 14.834.  Takes Levothyroxine 71mcg po daily, update TSH in 6 weeks. ? AR of  Amiodarone

## 2013-05-04 NOTE — Assessment & Plan Note (Signed)
Rate controlled on amiodarone. EKG 04/18/13 showed QT/QTc 416/500-f/u Cardiology.

## 2013-05-04 NOTE — Progress Notes (Signed)
Patient ID: [name] [name], female   DOB: 1932-01-21, 80 y.o.   MRN: [id number]   Code Status: DNR  Allergies  Allergen Reactions  . Codeine Sulfate Nausea And Vomiting  . Propoxyphene Napsylate Nausea And Vomiting  . Sulfacetamide Sodium-Sulfur   . Sulfamethoxazole-Trimethoprim Nausea Only    Can't remember reaction  . Zolpidem     Other reaction(s): Mental Status Changes (intolerance)    Chief Complaint  Patient presents with  . Medical Managment of Chronic Issues  . Acute Visit    insomnia, ankle edema, anemia.     HPI: Patient is a 80 y.o. female seen in the SNF at [facility] today for evaluation of insomnia, ankle edema/CHF, CKD, hypothyroidism, anemia, anxiety/restless legs, s/p CABGS x4 03/25/13,  and other chronic medical conditions.  Problem List Items Addressed This Visit   A-fib - Primary     Rate controlled on amiodarone. EKG 04/18/13 showed QT/QTc 416/500-f/u Cardiology.       Relevant Medications      furosemide (LASIX) 20 MG tablet   Anemia in chronic renal disease     continued with B12. Hx of Aranesp inj(f/u q2 months at cancer center) and blood transfusion. Hgb 8.2 04/15/13 and 8.3 04/17/13. Low serum Iron 34 and Vit B12>2000 04/17/13-Hgb 8.1 04/22/13-Aranesp 291mg inj 04/22/13. Next inj 05/31/13     ANXIETY     Anxious. Lorazepam 0.51mq6hr prn needed. Improved since offTrazodone 2582mhs and started Mirtazapine 59m39m7/15/ treated newly diagnosed hypothyroidism and Aranesp inj for anemia help too. She stated she feels well and her appetite is much improved(no longer nauseous)     Relevant Medications      mirtazapine (REMERON) 15 MG tablet      LORazepam (ATIVAN) 0.5 MG tablet   CAD     CABG x4 03/25/13. ASA, statin, BCB for risk reduction    Relevant Medications      furosemide (LASIX) 20 MG tablet   CHF (congestive heart failure)     improved SOB, cough, and BLE edema. BNP 957.4 04/18/13. continue Furosemide 20mg5mdaily. Bun/creat 15/2.29 04/22/13      Relevant Medications      furosemide (LASIX) 20 MG tablet   DEPRESSION     No longer anxious. She is up in w/c, appears rested, smiling, conversing, and stated she feels well. Continue Lorazepam 0.5mg q53m prn and Mirtazapine 59mg. 26mTrazodone 25mg qh50m  Relevant Medications      mirtazapine (REMERON) 15 MG tablet      LORazepam (ATIVAN) 0.5 MG tablet   GERD     continue with PPI(previously taking Ranitidine). Resolved nausea. Zofran prn available to her. Amylase and Lipase unremarkable. 49/44 04/15/13      INSOMNIA, PERSISTENT     C/o difficulty fallign asleep-Lorazepam prn effective-last dose 12am today. Improved poor appetite since Mirtazapine 59mg 4/770mand off Trazodone.      RENAL INSUFFICIENCY     Creatinine 1.89 04/15/13, worsened since Lasix added. Creat 2.29 04/22/13. Nephrology when the patient desires. Update BMP next week.       Restless leg     Prn Lorazepam helps-last dose was this 12am.     Unspecified constipation     Managed with daily MiraLax and Senna II qhs.      Unspecified hypothyroidism     New, TSH 14.834.  Takes Levothyroxine 50mcg po 74my, update TSH in 6 weeks. ? AR of  Amiodarone  Review of Systems:  Review of Systems  Constitutional: Negative for fever, chills, weight loss and malaise/fatigue.       Generalized weakness-better  HENT: Positive for hearing loss. Negative for congestion, ear discharge, ear pain, nosebleeds, sore throat and tinnitus.   Eyes: Negative for blurred vision, double vision, photophobia, pain, discharge and redness.  Respiratory: Negative for cough, hemoptysis, sputum production, shortness of breath, wheezing and stridor.   Cardiovascular: Positive for leg swelling. Negative for chest pain, palpitations, orthopnea and claudication.       1+ edema in BLE-improved, trace in ankles.   Gastrointestinal: Negative for heartburn, nausea, vomiting, abdominal pain, diarrhea, constipation, blood in stool and  melena.       Nausea is resolved. Appetite is better.   Genitourinary: Negative for dysuria, urgency, frequency, hematuria and flank pain.  Musculoskeletal: Negative for back pain, falls, joint pain, myalgias and neck pain.  Skin: Negative for itching and rash.  Neurological: Positive for weakness. Negative for dizziness, tingling, tremors, sensory change, speech change, focal weakness, seizures, loss of consciousness and headaches.       C/o restless legs  Endo/Heme/Allergies: Negative for environmental allergies and polydipsia. Does not bruise/bleed easily.  Psychiatric/Behavioral: Positive for memory loss. Negative for depression, suicidal ideas, hallucinations and substance abuse. The patient is nervous/anxious and has insomnia.      Past Medical History  Diagnosis Date  . Unspecified essential hypertension     Dr [name]  . Other and unspecified hyperlipidemia   . Unspecified disorder resulting from impaired renal function   . Diverticulosis of colon with hemorrhage   . Unspecified vitamin D deficiency   . Unspecified asthma(493.90)   . Persistent disorder of initiating or maintaining sleep   . Lumbago   . Esophageal reflux   . Diverticulosis of colon (without mention of hemorrhage)   . Depressive disorder, not elsewhere classified   . Anxiety state, unspecified   . Anemia, unspecified   . Unspecified osteomyelitis, site unspecified   . Myocardial infarction   . Thyroid disease    Past Surgical History  Procedure Laterality Date  . Bone marrow biopsy    . Status post right carotid enterectomy    . Partial colectomy  1995    diverticulosis of colon iwth hemorrhage.   . Coronary artery bypass graft  03/25/13    x4. CABS ON PUMP-Surgeon Jomarie Longs.   Social History:   reports that she has never smoked. She has never used smokeless tobacco. She reports that she drinks alcohol. She reports that she does not use illicit drugs.  Family History  Problem Relation Age of Onset  .  Hypertension Other   . COPD Mother   . Cancer Father     lymphoma    Medications: Patient's Medications  New Prescriptions   No medications on file  Previous Medications   AMIODARONE (PACERONE) 200 MG TABLET    Take 200 mg by mouth daily.   ASPIRIN 325 MG EC TABLET    Take 325 mg by mouth daily.   ATORVASTATIN (LIPITOR) 40 MG TABLET    TAKE 1 TABLET BY MOUTH EVERY DAY   CHOLECALCIFEROL (EQL VITAMIN D3) 1000 UNITS TABLET    Take 1,000 Units by mouth daily.     DARBEPOETIN (ARANESP, ALB FREE, SURECLICK) 620 BTD/9.7CB SOLN    Inject 100 mcg into the skin every 8 (eight) weeks.    FUROSEMIDE (LASIX) 20 MG TABLET    Take 20 mg by mouth daily.   LEVOTHYROXINE (SYNTHROID,  LEVOTHROID) 50 MCG TABLET    Take 50 mcg by mouth daily before breakfast.   LORAZEPAM (ATIVAN) 0.5 MG TABLET    Take 0.5 mg by mouth every 6 (six) hours as needed for anxiety.   METOPROLOL TARTRATE (LOPRESSOR) 25 MG TABLET    Take 12.5 mg by mouth 2 (two) times daily.   MIRTAZAPINE (REMERON) 15 MG TABLET    Take 15 mg by mouth at bedtime.   OMEPRAZOLE (PRILOSEC) 20 MG CAPSULE    Take 20 mg by mouth daily.   POLYETHYLENE GLYCOL (MIRALAX / GLYCOLAX) PACKET    Take 17 g by mouth daily.   SENNOSIDES-DOCUSATE SODIUM (SENOKOT-S) 8.6-50 MG TABLET    Take 2 tablets by mouth daily.   VITAMIN B-12 (CYANOCOBALAMIN) 1000 MCG TABLET    Take 1,000 mcg by mouth daily.    Modified Medications   No medications on file  Discontinued Medications   TRAZODONE (DESYREL) 50 MG TABLET    Take 25 mg by mouth at bedtime.   Physical Exam: Physical Exam  Constitutional: She is oriented to person, place, and time. She appears well-developed. No distress.  Overweight. Generalized weakness-better  HENT:  Head: Normocephalic.  Right Ear: External ear normal.  Left Ear: External ear normal.  Nose: Nose normal.  Mouth/Throat: Oropharynx is clear and moist.  Eyes: Conjunctivae are normal. Pupils are equal, round, and reactive to light. Right eye  exhibits no discharge. Left eye exhibits no discharge.  Neck: Normal range of motion. Neck supple. No JVD present. No tracheal deviation present. No thyromegaly present.  Cardiovascular: Normal rate, regular rhythm and normal heart sounds.   Pulmonary/Chest: No stridor. No respiratory distress. She has no wheezes.  Abdominal: Soft. Bowel sounds are normal. She exhibits no distension and no mass. There is no tenderness. There is no rebound and no guarding.  Musculoskeletal: She exhibits edema. She exhibits no tenderness (LS spine, hips).  1+ edema BLE-improved-trace ankle edema noted today.   Lymphadenopathy:    She has no cervical adenopathy.  Neurological: She is alert and oriented to person, place, and time. She displays normal reflexes. No cranial nerve deficit. She exhibits normal muscle tone. Coordination normal.  Skin: No rash noted. No erythema.  Mid sternum surgical incision healed.   Psychiatric: Her behavior is normal. Judgment and thought content normal. Her mood appears anxious. Her affect is not angry, not blunt, not labile and not inappropriate. Her speech is not delayed, not tangential and not slurred. She is not agitated, not aggressive, not hyperactive, not slowed, not withdrawn, not actively hallucinating and not combative. Thought content is not paranoid and not delusional. Cognition and memory are impaired. She does not express impulsivity or inappropriate judgment. She exhibits a depressed mood. She expresses no homicidal and no suicidal ideation. She expresses no suicidal plans and no homicidal plans. She is communicative. She exhibits abnormal recent memory.  Much improved.  She is attentive.    Filed Vitals:   05/04/13 1132  BP: 109/52  Pulse: 81  Temp: 98.5 F (36.9 C)  TempSrc: Tympanic  Resp: 20   Labs reviewed: Basic Metabolic Panel:  Recent Labs  06/29/12 1143 10/01/12 0926 12/14/12 1329 04/15/13 04/22/13  NA 138 137 137 138 139  K 4.4 4.1 3.9 3.8 4.0    CL 105 104  --   --   --   CO2 _0 --   --   GLUCOSE 97 95 125  --   --   BUN 41.4*  35* 33.3* 16 15  CREATININE 2.0* 1.8* 1.7* 1.9* 2.3*  CALCIUM 9.3 9.2 9.1  --   --   TSH  --  4.98  --  14.83*  --    Liver Function Tests:  Recent Labs  06/29/12 1143 10/01/12 0926 12/14/12 1329 04/15/13  AST _0 ALT _1 ALKPHOS 94 81 90 74  BILITOT 0.60 0.7 0.41  --   PROT 7.0 6.8 6.7  --   ALBUMIN 3.5 3.6 3.4*  --    CBC:  Recent Labs  12/14/12 1329 02/08/13 1016 04/15/13 04/22/13 04/22/13 1101  WBC 6.8 6.0 5.1 5.8 5.8  NEUTROABS 4.3 3.5  --   --  3.9  HGB 10.4* 11.3* 8.2* 8.1* 8.1 Repeated and Verified*  HCT 30.8* 33.7* 24* 25* 25.3*  MCV 93.2 93.2  --   --  88.2  PLT 370 350 501* 413* 413*   Lipid Panel:  Recent Labs  10/01/12 0926 04/15/13  CHOL 155 92  HDL 56.60 40  LDLCALC 80 30  TRIG 92.0 62  CHOLHDL 3  --     Past Procedures:  03/01/13 chst CTA no evidence of acugte aortic pathology. Subendocardial low attenuation along the anteroapical left ventricle, age indeterminate, with associated myocardial thinning, osteopenia with numerous anterior compression fractures in teh visualized thoracic spine, these area age indeterminate without prior imagine for comparison, mild dependent interlobular septal thickening which could be related to early interstitial pulmonary edema.   03/02/13 Echocardiogram: EF 45-50%. There are regional wall motion abnormalities. Trace aortic regurgitation, trace mitral regurgitation, trace pulmonic valvular regurgitation, trace tricuspid regurgitation.   03/03/13 Pulmonary function test: FVC 1.65, FEV1 1.35, FEV1/FVC 81, TLC 3.66   03/25/13 ECG SR. T wave abnormality consider anteolateral ischemia   04/16/13 CXR probable bilateral lower lobe infiltrates of pneumonia.   04/20/13 US thyroid: nodular density identified in the right lobe of the thyroid.   Assessment/Plan A-fib Rate controlled on amiodarone. EKG 04/18/13 showed  QT/QTc 416/500-f/u Cardiology.     Anemia in chronic renal disease continued with B12. Hx of Aranesp inj(f/u q2 months at cancer center) and blood transfusion. Hgb 8.2 04/15/13 and 8.3 04/17/13. Low serum Iron 34 and Vit B12>2000 04/17/13-Hgb 8.1 04/22/13-Aranesp 232mg inj 04/22/13. Next inj 05/31/13   ANXIETY Anxious. Lorazepam 0.523mq6hr prn needed. Improved since offTrazodone 2544mhs and started Mirtazapine 71m66m7/15/ treated newly diagnosed hypothyroidism and Aranesp inj for anemia help too. She stated she feels well and her appetite is much improved(no longer nauseous)   DEPRESSION No longer anxious. She is up in w/c, appears rested, smiling, conversing, and stated she feels well. Continue Lorazepam 0.5mg 21mr prn and Mirtazapine 71mg.54m Trazodone 25mg q49m Unspecified hypothyroidism New, TSH 14.834.  Takes Levothyroxine 50mcg p6mily, update TSH in 6 weeks. ? AR of  Amiodarone      Unspecified constipation Managed with daily MiraLax and Senna II qhs.    CAD CABG x4 03/25/13. ASA, statin, BCB for risk reduction  Restless leg Prn Lorazepam helps-last dose was this 12am.   INSOMNIA, PERSISTENT C/o difficulty fallign asleep-Lorazepam prn effective-last dose 12am today. Improved poor appetite since Mirtazapine 71mg 4/736mand off Trazodone.    CHF (congestive heart failure) improved SOB, cough, and BLE edema. BNP 957.4 04/18/13. continue Furosemide 20mg po d43m. Bun/creat 15/2.29 04/22/13    RENAL INSUFFICIENCY Creatinine 1.89 04/15/13, worsened since Lasix added. Creat 2.29 04/22/13. Nephrology when the patient desires. Update BMP next  week.     GERD continue with PPI(previously taking Ranitidine). Resolved nausea. Zofran prn available to her. Amylase and Lipase unremarkable. 49/44 04/15/13      Family/ Staff Communication: observe the patient.   Goals of Care: IL  Labs/tests ordered: TSH in 6 weeks. BMP one week.

## 2013-05-04 NOTE — Assessment & Plan Note (Signed)
No longer anxious. She is up in w/c, appears rested, smiling, conversing, and stated she feels well. Continue Lorazepam 0.5mg  q6hr prn and Mirtazapine 15mg . off Trazodone 25mg  qhs

## 2013-05-04 NOTE — Assessment & Plan Note (Signed)
continued with B12. Hx of Aranesp inj(f/u q2 months at cancer center) and blood transfusion. Hgb 8.2 04/15/13 and 8.3 04/17/13. Low serum Iron 34 and Vit B12>2000 04/17/13-Hgb 8.1 04/22/13-Aranesp 256mcg inj 04/22/13. Next inj 05/31/13

## 2013-05-04 NOTE — Assessment & Plan Note (Signed)
Prn Lorazepam helps-last dose was this 12am.

## 2013-05-04 NOTE — Assessment & Plan Note (Signed)
C/o difficulty fallign asleep-Lorazepam prn effective-last dose 12am today. Improved poor appetite since Mirtazapine 15mg  04/27/13 and off Trazodone.

## 2013-05-04 NOTE — Assessment & Plan Note (Signed)
Managed with daily MiraLax and Senna II qhs.

## 2013-05-04 NOTE — Assessment & Plan Note (Signed)
continue with PPI(previously taking Ranitidine). Resolved nausea. Zofran prn available to her. Amylase and Lipase unremarkable. 49/44 04/15/13

## 2013-05-04 NOTE — Assessment & Plan Note (Signed)
CABG x4 03/25/13. ASA, statin, BCB for risk reduction

## 2013-05-04 NOTE — Assessment & Plan Note (Signed)
Anxious. Lorazepam 0.5mg  q6hr prn needed. Improved since offTrazodone 25mg  qhs and started Mirtazapine 15mg  04/27/13/ treated newly diagnosed hypothyroidism and Aranesp inj for anemia help too. She stated she feels well and her appetite is much improved(no longer nauseous)

## 2013-05-04 NOTE — Assessment & Plan Note (Signed)
improved SOB, cough, and BLE edema. BNP 957.4 04/18/13. continue Furosemide 20mg  po daily. Bun/creat 15/2.29 04/22/13

## 2013-05-05 ENCOUNTER — Encounter: Payer: Self-pay | Admitting: *Deleted

## 2013-05-05 DIAGNOSIS — Z951 Presence of aortocoronary bypass graft: Secondary | ICD-10-CM | POA: Diagnosis not present

## 2013-05-05 DIAGNOSIS — Z48812 Encounter for surgical aftercare following surgery on the circulatory system: Secondary | ICD-10-CM | POA: Diagnosis not present

## 2013-05-05 DIAGNOSIS — J90 Pleural effusion, not elsewhere classified: Secondary | ICD-10-CM | POA: Diagnosis not present

## 2013-05-05 DIAGNOSIS — J9819 Other pulmonary collapse: Secondary | ICD-10-CM | POA: Diagnosis not present

## 2013-05-05 DIAGNOSIS — I2510 Atherosclerotic heart disease of native coronary artery without angina pectoris: Secondary | ICD-10-CM | POA: Diagnosis not present

## 2013-05-13 ENCOUNTER — Telehealth: Payer: Self-pay | Admitting: Cardiovascular Disease

## 2013-05-13 ENCOUNTER — Ambulatory Visit: Payer: Medicare Other | Admitting: Cardiovascular Disease

## 2013-05-13 NOTE — Telephone Encounter (Signed)
Returned call to patient's daughter Olin Hauser.She stated her mother recently had a MI and CABG's x4 at [facility] in [place].Stated heart surgery was done by Dr.Kon 03/25/13.Stated they are suppose to send Dr.Nishan all her records.Stated she wanted to make sure Dr.Nishan gets records before her appointment with him 06/10/13.Message sent to Dr.Nishan's nurse.

## 2013-05-13 NOTE — Telephone Encounter (Signed)
New Prob    Pts daughter following up on notes. States records from [place] may have been faxed over on behalf of pt. Please call to confirm.

## 2013-05-16 DIAGNOSIS — G2581 Restless legs syndrome: Secondary | ICD-10-CM | POA: Diagnosis not present

## 2013-05-16 DIAGNOSIS — Z48810 Encounter for surgical aftercare following surgery on the sense organs: Secondary | ICD-10-CM | POA: Diagnosis not present

## 2013-05-16 DIAGNOSIS — R5381 Other malaise: Secondary | ICD-10-CM | POA: Diagnosis not present

## 2013-05-16 DIAGNOSIS — I509 Heart failure, unspecified: Secondary | ICD-10-CM | POA: Diagnosis not present

## 2013-05-16 DIAGNOSIS — E538 Deficiency of other specified B group vitamins: Secondary | ICD-10-CM | POA: Diagnosis not present

## 2013-05-16 DIAGNOSIS — I10 Essential (primary) hypertension: Secondary | ICD-10-CM | POA: Diagnosis not present

## 2013-05-16 DIAGNOSIS — Z602 Problems related to living alone: Secondary | ICD-10-CM | POA: Diagnosis not present

## 2013-05-16 DIAGNOSIS — I4891 Unspecified atrial fibrillation: Secondary | ICD-10-CM | POA: Diagnosis not present

## 2013-05-16 DIAGNOSIS — Z48812 Encounter for surgical aftercare following surgery on the circulatory system: Secondary | ICD-10-CM | POA: Diagnosis not present

## 2013-05-16 DIAGNOSIS — R5383 Other fatigue: Secondary | ICD-10-CM | POA: Diagnosis not present

## 2013-05-17 DIAGNOSIS — I509 Heart failure, unspecified: Secondary | ICD-10-CM | POA: Diagnosis not present

## 2013-05-17 DIAGNOSIS — I10 Essential (primary) hypertension: Secondary | ICD-10-CM | POA: Diagnosis not present

## 2013-05-17 DIAGNOSIS — G2581 Restless legs syndrome: Secondary | ICD-10-CM | POA: Diagnosis not present

## 2013-05-17 DIAGNOSIS — Z48812 Encounter for surgical aftercare following surgery on the circulatory system: Secondary | ICD-10-CM | POA: Diagnosis not present

## 2013-05-17 DIAGNOSIS — E538 Deficiency of other specified B group vitamins: Secondary | ICD-10-CM | POA: Diagnosis not present

## 2013-05-17 DIAGNOSIS — I4891 Unspecified atrial fibrillation: Secondary | ICD-10-CM | POA: Diagnosis not present

## 2013-05-18 DIAGNOSIS — I4891 Unspecified atrial fibrillation: Secondary | ICD-10-CM | POA: Diagnosis not present

## 2013-05-18 DIAGNOSIS — Z48812 Encounter for surgical aftercare following surgery on the circulatory system: Secondary | ICD-10-CM | POA: Diagnosis not present

## 2013-05-18 DIAGNOSIS — I10 Essential (primary) hypertension: Secondary | ICD-10-CM | POA: Diagnosis not present

## 2013-05-18 DIAGNOSIS — E538 Deficiency of other specified B group vitamins: Secondary | ICD-10-CM | POA: Diagnosis not present

## 2013-05-18 DIAGNOSIS — I509 Heart failure, unspecified: Secondary | ICD-10-CM | POA: Diagnosis not present

## 2013-05-18 DIAGNOSIS — G2581 Restless legs syndrome: Secondary | ICD-10-CM | POA: Diagnosis not present

## 2013-05-18 NOTE — Telephone Encounter (Signed)
SPOKE WITH  BOTH DAUGHTERS RE   RECORDS  WILL FAX  HOSPITAL   RECORDS  TODAY  OR  WILL BRING   TO  APPT  TOMORROW.APPT   WAS  MOVED  UP  SEE  APPTS./CY

## 2013-05-19 ENCOUNTER — Encounter: Payer: Self-pay | Admitting: Cardiovascular Disease

## 2013-05-19 ENCOUNTER — Ambulatory Visit (INDEPENDENT_AMBULATORY_CARE_PROVIDER_SITE_OTHER): Payer: Medicare Other | Admitting: Cardiovascular Disease

## 2013-05-19 VITALS — BP 140/70 | HR 82 | Ht 63.0 in | Wt 164.0 lb

## 2013-05-19 DIAGNOSIS — E538 Deficiency of other specified B group vitamins: Secondary | ICD-10-CM | POA: Diagnosis not present

## 2013-05-19 DIAGNOSIS — I6529 Occlusion and stenosis of unspecified carotid artery: Secondary | ICD-10-CM | POA: Diagnosis not present

## 2013-05-19 DIAGNOSIS — I10 Essential (primary) hypertension: Secondary | ICD-10-CM | POA: Diagnosis not present

## 2013-05-19 DIAGNOSIS — I4891 Unspecified atrial fibrillation: Secondary | ICD-10-CM | POA: Diagnosis not present

## 2013-05-19 DIAGNOSIS — G2581 Restless legs syndrome: Secondary | ICD-10-CM | POA: Diagnosis not present

## 2013-05-19 DIAGNOSIS — I509 Heart failure, unspecified: Secondary | ICD-10-CM | POA: Diagnosis not present

## 2013-05-19 DIAGNOSIS — I2510 Atherosclerotic heart disease of native coronary artery without angina pectoris: Secondary | ICD-10-CM

## 2013-05-19 DIAGNOSIS — Z48812 Encounter for surgical aftercare following surgery on the circulatory system: Secondary | ICD-10-CM | POA: Diagnosis not present

## 2013-05-19 MED ORDER — FUROSEMIDE 20 MG PO TABS: 20.0000 mg | ORAL_TABLET | Freq: Every day | ORAL | Status: DC

## 2013-05-19 MED ORDER — ATORVASTATIN CALCIUM 40 MG PO TABS: 40.0000 mg | ORAL_TABLET | Freq: Every day | ORAL | Status: DC

## 2013-05-19 MED ORDER — METOPROLOL TARTRATE 25 MG PO TABS: 12.5000 mg | ORAL_TABLET | Freq: Two times a day (BID) | ORAL | Status: DC

## 2013-05-19 NOTE — Assessment & Plan Note (Signed)
Has f/u duplex Friday known [id number] LICA No TIA or issues during CABG

## 2013-05-19 NOTE — Assessment & Plan Note (Signed)
Well controlled.  Continue current medications and low sodium Dash type diet.    

## 2013-05-19 NOTE — Assessment & Plan Note (Signed)
Resolved no need for anticoagulation given limited PAF in setting of CABG

## 2013-05-19 NOTE — Assessment & Plan Note (Signed)
Recent CABG  Good recovery post op afib resolved  Amiodarone d/c

## 2013-05-19 NOTE — Patient Instructions (Addendum)
Your physician wants you to follow-up in: [place] will receive a reminder letter in the mail two months in advance. If you don't receive a letter, please call our office to schedule the follow-up appointment.  Your physician has recommended you make the following change in your medication: DECREASE  LIPITOR  TO   40 MG

## 2013-05-19 NOTE — Progress Notes (Signed)
Patient ID: SHAWTA SPEARIN, female   DOB: [birth year], 80 y.o.   MRN: [id number] [name] [name] returns today for F/U of HTN, palpitations and fatigue. She was hospitalized2014  for a diverticular bleed. This seems stable and she has F/U with Dr. Earlean Shawl. She has had palpitations. I reviewed her recenet event monitor and it was benighn with no significant arrythmias. She has been compliant with her BP meds. She has had a previous RCEA by Dr. Amedeo Plenty. She has residua40-59% bilateral stenosis by duplex 10/13 She will have a F/U duplex in10/14 She has had no TIA like symptoms and is off asa now due to her diverticular bleed. She denies SSCP, palpitations, dyspnea, PND orthopnea and edema  She is on a study at [facility] for BP And gets it followed every 3 months   Hospitalized at [facility] for syncope  in March Subsequently had chest pain and cath done   Had CABG with Dr Clementeen Graham  SVG d RCA, SVG OM1, distal circumflex and LIMA LAD  Had brief periop afib Rx with amidarone and left hospital in [place]    ROS: Denies fever, malais, weight loss, blurry vision, decreased visual acuity, cough, sputum, SOB, hemoptysis, pleuritic pain, palpitaitons, heartburn, abdominal pain, melena, lower extremity edema, claudication, or rash.  All other systems reviewed and negative  General: Affect appropriate Healthy:  appears stated age 51: normal Neck supple with no adenopathy JVP normal left  bruits no thyromegaly Lungs clear with no wheezing and good diaphragmatic motion Heart:  S1/S2 no murmur, no rub, gallop or click  Sternum well healed  PMI normal Abdomen: benighn, BS positve, no tenderness, no AAA no bruit.  No HSM or HJR Distal pulses intact with no bruits Plus one bilateral edema with varicosities  Neuro non-focal Skin warm and dry No muscular weakness Right endoscopic harvest    Current Outpatient Prescriptions  Medication Sig Dispense Refill  . amiodarone (PACERONE) 200 MG tablet Take 200 mg by mouth daily.      [name]  aspirin 325 MG EC tablet Take 325 mg by mouth daily.      [name] atorvastatin (LIPITOR) 40 MG tablet TAKE 2 TABLET BY MOUTH EVERY DAY (80mg )      . Cholecalciferol (EQL VITAMIN D3) 1000 UNITS tablet Take 1,000 Units by mouth daily.        . darbepoetin (ARANESP, ALB FREE, SURECLICK) 123XX123 [name] [name] Inject 100 mcg into the skin every 8 (eight) weeks.       . furosemide (LASIX) 20 MG tablet Take 20 mg by mouth daily.      [name] levothyroxine (SYNTHROID, LEVOTHROID) 50 MCG tablet Take 50 mcg by mouth daily before breakfast.      . LORazepam (ATIVAN) 0.5 MG tablet Take 0.5 mg by mouth every 6 (six) hours as needed for anxiety.      . metoprolol tartrate (LOPRESSOR) 25 MG tablet Take 12.5 mg by mouth 2 (two) times daily.      . mirtazapine (REMERON) 15 MG tablet Take 15 mg by mouth at bedtime.      [name] omeprazole (PRILOSEC) 20 MG capsule Take 20 mg by mouth daily.      . polyethylene glycol (MIRALAX / GLYCOLAX) packet Take 17 g by mouth daily.      . sennosides-docusate sodium (SENOKOT-S) 8.6-50 MG tablet Take 2 tablets by mouth daily.      . vitamin B-12 (CYANOCOBALAMIN) 1000 MCG tablet Take 1,000 mcg by mouth daily.         No current  facility-administered medications for this visit.    Allergies  Codeine sulfate; Darvon; Propoxyphene napsylate; Sulfacetamide sodium-sulfur; Sulfamethoxazole-trimethoprim; and Zolpidem  Electrocardiogram:  Assessment and Plan

## 2013-05-20 ENCOUNTER — Telehealth: Payer: Self-pay | Admitting: Cardiovascular Disease

## 2013-05-20 DIAGNOSIS — I4891 Unspecified atrial fibrillation: Secondary | ICD-10-CM | POA: Diagnosis not present

## 2013-05-20 DIAGNOSIS — Z48812 Encounter for surgical aftercare following surgery on the circulatory system: Secondary | ICD-10-CM | POA: Diagnosis not present

## 2013-05-20 DIAGNOSIS — G2581 Restless legs syndrome: Secondary | ICD-10-CM | POA: Diagnosis not present

## 2013-05-20 DIAGNOSIS — I509 Heart failure, unspecified: Secondary | ICD-10-CM | POA: Diagnosis not present

## 2013-05-20 DIAGNOSIS — I10 Essential (primary) hypertension: Secondary | ICD-10-CM | POA: Diagnosis not present

## 2013-05-20 DIAGNOSIS — E538 Deficiency of other specified B group vitamins: Secondary | ICD-10-CM | POA: Diagnosis not present

## 2013-05-20 NOTE — Telephone Encounter (Signed)
Pt would like Dr. [name] to order for cardiac rehab. Surgery was 03/25/13 Pt is back at home with [facility]  Forwarded to Dr. Gearldine Bienenstock RN

## 2013-05-20 NOTE — Telephone Encounter (Signed)
Ok to refer to cardiac rehab

## 2013-05-20 NOTE — Telephone Encounter (Signed)
°  Patient would like RX to attend Cardiac Rehab. Please call and advise.

## 2013-05-21 ENCOUNTER — Ambulatory Visit (HOSPITAL_COMMUNITY): Payer: Medicare Other | Attending: Internal Medicine | Admitting: Cardiology

## 2013-05-21 DIAGNOSIS — I2510 Atherosclerotic heart disease of native coronary artery without angina pectoris: Secondary | ICD-10-CM | POA: Insufficient documentation

## 2013-05-21 DIAGNOSIS — I658 Occlusion and stenosis of other precerebral arteries: Secondary | ICD-10-CM | POA: Insufficient documentation

## 2013-05-21 DIAGNOSIS — I10 Essential (primary) hypertension: Secondary | ICD-10-CM | POA: Insufficient documentation

## 2013-05-21 DIAGNOSIS — E538 Deficiency of other specified B group vitamins: Secondary | ICD-10-CM | POA: Diagnosis not present

## 2013-05-21 DIAGNOSIS — I6529 Occlusion and stenosis of unspecified carotid artery: Secondary | ICD-10-CM | POA: Diagnosis not present

## 2013-05-21 DIAGNOSIS — G2581 Restless legs syndrome: Secondary | ICD-10-CM | POA: Diagnosis not present

## 2013-05-21 DIAGNOSIS — I4891 Unspecified atrial fibrillation: Secondary | ICD-10-CM | POA: Diagnosis not present

## 2013-05-21 DIAGNOSIS — I509 Heart failure, unspecified: Secondary | ICD-10-CM | POA: Diagnosis not present

## 2013-05-21 DIAGNOSIS — Z48812 Encounter for surgical aftercare following surgery on the circulatory system: Secondary | ICD-10-CM | POA: Diagnosis not present

## 2013-05-21 NOTE — Progress Notes (Signed)
Carotid duplex completed 

## 2013-05-24 ENCOUNTER — Telehealth: Payer: Self-pay | Admitting: Internal Medicine

## 2013-05-24 DIAGNOSIS — I10 Essential (primary) hypertension: Secondary | ICD-10-CM | POA: Diagnosis not present

## 2013-05-24 DIAGNOSIS — I4891 Unspecified atrial fibrillation: Secondary | ICD-10-CM | POA: Diagnosis not present

## 2013-05-24 DIAGNOSIS — Z48812 Encounter for surgical aftercare following surgery on the circulatory system: Secondary | ICD-10-CM | POA: Diagnosis not present

## 2013-05-24 DIAGNOSIS — I509 Heart failure, unspecified: Secondary | ICD-10-CM | POA: Diagnosis not present

## 2013-05-24 DIAGNOSIS — G2581 Restless legs syndrome: Secondary | ICD-10-CM | POA: Diagnosis not present

## 2013-05-24 DIAGNOSIS — E538 Deficiency of other specified B group vitamins: Secondary | ICD-10-CM | POA: Diagnosis not present

## 2013-05-24 NOTE — Telephone Encounter (Signed)
Patient is calling to request a new rx for her mirtazapine (REMERON) 15 MG tablet rx to be sent to [facility] on [address].

## 2013-05-24 NOTE — Telephone Encounter (Signed)
OK to fill this prescription with additional refills x5 Thank you!  

## 2013-05-25 DIAGNOSIS — Z48812 Encounter for surgical aftercare following surgery on the circulatory system: Secondary | ICD-10-CM | POA: Diagnosis not present

## 2013-05-25 DIAGNOSIS — I509 Heart failure, unspecified: Secondary | ICD-10-CM | POA: Diagnosis not present

## 2013-05-25 DIAGNOSIS — I10 Essential (primary) hypertension: Secondary | ICD-10-CM | POA: Diagnosis not present

## 2013-05-25 DIAGNOSIS — E538 Deficiency of other specified B group vitamins: Secondary | ICD-10-CM | POA: Diagnosis not present

## 2013-05-25 DIAGNOSIS — G2581 Restless legs syndrome: Secondary | ICD-10-CM | POA: Diagnosis not present

## 2013-05-25 DIAGNOSIS — I4891 Unspecified atrial fibrillation: Secondary | ICD-10-CM | POA: Diagnosis not present

## 2013-05-25 MED ORDER — MIRTAZAPINE 15 MG PO TABS: 15.0000 mg | ORAL_TABLET | Freq: Every day | ORAL | Status: DC

## 2013-05-25 NOTE — Telephone Encounter (Signed)
Done

## 2013-05-26 DIAGNOSIS — G2581 Restless legs syndrome: Secondary | ICD-10-CM | POA: Diagnosis not present

## 2013-05-26 DIAGNOSIS — E538 Deficiency of other specified B group vitamins: Secondary | ICD-10-CM | POA: Diagnosis not present

## 2013-05-26 DIAGNOSIS — I509 Heart failure, unspecified: Secondary | ICD-10-CM | POA: Diagnosis not present

## 2013-05-26 DIAGNOSIS — Z48812 Encounter for surgical aftercare following surgery on the circulatory system: Secondary | ICD-10-CM | POA: Diagnosis not present

## 2013-05-26 DIAGNOSIS — I10 Essential (primary) hypertension: Secondary | ICD-10-CM | POA: Diagnosis not present

## 2013-05-26 DIAGNOSIS — I4891 Unspecified atrial fibrillation: Secondary | ICD-10-CM | POA: Diagnosis not present

## 2013-05-27 DIAGNOSIS — I509 Heart failure, unspecified: Secondary | ICD-10-CM | POA: Diagnosis not present

## 2013-05-27 DIAGNOSIS — I4891 Unspecified atrial fibrillation: Secondary | ICD-10-CM | POA: Diagnosis not present

## 2013-05-27 DIAGNOSIS — G2581 Restless legs syndrome: Secondary | ICD-10-CM | POA: Diagnosis not present

## 2013-05-27 DIAGNOSIS — Z48812 Encounter for surgical aftercare following surgery on the circulatory system: Secondary | ICD-10-CM | POA: Diagnosis not present

## 2013-05-27 DIAGNOSIS — I10 Essential (primary) hypertension: Secondary | ICD-10-CM | POA: Diagnosis not present

## 2013-05-27 DIAGNOSIS — E538 Deficiency of other specified B group vitamins: Secondary | ICD-10-CM | POA: Diagnosis not present

## 2013-05-27 NOTE — Telephone Encounter (Signed)
PT  NOTIFIED ./CY 

## 2013-05-28 ENCOUNTER — Ambulatory Visit (INDEPENDENT_AMBULATORY_CARE_PROVIDER_SITE_OTHER): Payer: Medicare Other | Admitting: Internal Medicine

## 2013-05-28 ENCOUNTER — Encounter: Payer: Self-pay | Admitting: Internal Medicine

## 2013-05-28 VITALS — BP 150/88 | HR 76 | Temp 98.4°F | Resp 16 | Wt 166.0 lb

## 2013-05-28 DIAGNOSIS — I214 Non-ST elevation (NSTEMI) myocardial infarction: Secondary | ICD-10-CM

## 2013-05-28 DIAGNOSIS — J45909 Unspecified asthma, uncomplicated: Secondary | ICD-10-CM

## 2013-05-28 DIAGNOSIS — E538 Deficiency of other specified B group vitamins: Secondary | ICD-10-CM | POA: Diagnosis not present

## 2013-05-28 DIAGNOSIS — E785 Hyperlipidemia, unspecified: Secondary | ICD-10-CM

## 2013-05-28 DIAGNOSIS — I509 Heart failure, unspecified: Secondary | ICD-10-CM | POA: Diagnosis not present

## 2013-05-28 DIAGNOSIS — N189 Chronic kidney disease, unspecified: Secondary | ICD-10-CM

## 2013-05-28 DIAGNOSIS — N259 Disorder resulting from impaired renal tubular function, unspecified: Secondary | ICD-10-CM

## 2013-05-28 DIAGNOSIS — F3289 Other specified depressive episodes: Secondary | ICD-10-CM

## 2013-05-28 DIAGNOSIS — I10 Essential (primary) hypertension: Secondary | ICD-10-CM

## 2013-05-28 DIAGNOSIS — I2510 Atherosclerotic heart disease of native coronary artery without angina pectoris: Secondary | ICD-10-CM

## 2013-05-28 DIAGNOSIS — I219 Acute myocardial infarction, unspecified: Secondary | ICD-10-CM | POA: Diagnosis not present

## 2013-05-28 DIAGNOSIS — Z48812 Encounter for surgical aftercare following surgery on the circulatory system: Secondary | ICD-10-CM | POA: Diagnosis not present

## 2013-05-28 DIAGNOSIS — F329 Major depressive disorder, single episode, unspecified: Secondary | ICD-10-CM | POA: Diagnosis not present

## 2013-05-28 DIAGNOSIS — I4891 Unspecified atrial fibrillation: Secondary | ICD-10-CM | POA: Diagnosis not present

## 2013-05-28 DIAGNOSIS — G2581 Restless legs syndrome: Secondary | ICD-10-CM | POA: Diagnosis not present

## 2013-05-28 DIAGNOSIS — D631 Anemia in chronic kidney disease: Secondary | ICD-10-CM | POA: Diagnosis not present

## 2013-05-28 DIAGNOSIS — N039 Chronic nephritic syndrome with unspecified morphologic changes: Secondary | ICD-10-CM

## 2013-05-28 MED ORDER — CHOLECALCIFEROL 25 MCG (1000 UT) PO TABS: 1000.0000 [IU] | ORAL_TABLET | Freq: Every day | ORAL | Status: DC

## 2013-05-28 MED ORDER — VITAMIN B-12 1000 MCG PO TABS: 1000.0000 ug | ORAL_TABLET | Freq: Every day | ORAL | Status: DC

## 2013-05-28 MED ORDER — MIRTAZAPINE 15 MG PO TABS: 15.0000 mg | ORAL_TABLET | Freq: Every day | ORAL | Status: DC

## 2013-05-28 MED ORDER — OMEPRAZOLE 20 MG PO CPDR: 20.0000 mg | DELAYED_RELEASE_CAPSULE | Freq: Every day | ORAL | Status: DC

## 2013-05-28 MED ORDER — LEVOTHYROXINE SODIUM 50 MCG PO TABS: 50.0000 ug | ORAL_TABLET | Freq: Every day | ORAL | Status: DC

## 2013-05-28 NOTE — Assessment & Plan Note (Signed)
CRF

## 2013-05-28 NOTE — Assessment & Plan Note (Signed)
Continue with current prescription therapy as reflected on the Med list.  

## 2013-05-28 NOTE — Progress Notes (Signed)
Pre visit review using our clinic review tool, if applicable. No additional management support is needed unless otherwise documented below in the visit note. 

## 2013-05-28 NOTE — Assessment & Plan Note (Signed)
F/u w/Urology 

## 2013-05-28 NOTE — Assessment & Plan Note (Signed)
2/15 CABG Better

## 2013-05-28 NOTE — Progress Notes (Signed)
  Subjective:     HPI Had an MI/CABG in Feb 2015 Hospitalized at [facility] for syncope in March Subsequently had chest pain and cath done Had CABG with Dr Clementeen Graham  SVG d RCA, SVG OM1, distal circumflex and LIMA LAD  Had brief periop afib Rx with amidarone and left hospital in [place] Back home from [place] 2 wks ago. In PT at home now 5/15). The patient presents for a follow-up of  chronic hypertension, chronic dyslipidemia, anemia, CRI, vit D def  Wt Readings from Last 3 Encounters:  05/28/13 166 lb (75.297 kg)  05/19/13 164 lb (74.39 kg)  04/30/13 190 lb (86.183 kg)   BP Readings from Last 3 Encounters:  05/28/13 150/88  05/19/13 140/70  05/04/13 109/52     Review of Systems  Constitutional: Negative for chills, activity change, appetite change, fatigue and unexpected weight change.  HENT: Negative for congestion, mouth sores and sinus pressure.   Eyes: Negative for visual disturbance.  Respiratory: Negative for cough and chest tightness.   Cardiovascular: Positive for leg swelling.  Gastrointestinal: Negative for nausea and abdominal pain.  Genitourinary: Negative for frequency, difficulty urinating and vaginal pain.  Musculoskeletal: Negative for back pain and gait problem.       Cramps  Skin: Negative for pallor and rash.  Neurological: Negative for dizziness, tremors, weakness, numbness and headaches.  Psychiatric/Behavioral: Positive for sleep disturbance. Negative for suicidal ideas and confusion. The patient is nervous/anxious.        Objective:   Physical Exam  Constitutional: She is oriented to person, place, and time. She appears well-developed. No distress.  Obese   HENT:  Head: Normocephalic.  Right Ear: External ear normal.  Left Ear: External ear normal.  Nose: Nose normal.  Mouth/Throat: Oropharynx is clear and moist.  Eyes: Conjunctivae are normal. Pupils are equal, round, and reactive to light. Right eye exhibits no discharge. Left eye exhibits no  discharge.  Neck: Normal range of motion. Neck supple. No JVD present. No tracheal deviation present. No thyromegaly present.  Cardiovascular: Normal rate, regular rhythm and normal heart sounds.   Pulmonary/Chest: No stridor. No respiratory distress. She has no wheezes.  Abdominal: Soft. Bowel sounds are normal. She exhibits no distension and no mass. There is no tenderness. There is no rebound and no guarding.  Musculoskeletal: She exhibits tenderness (LS spine, hips). She exhibits no edema.  Lymphadenopathy:    She has no cervical adenopathy.  Neurological: She is alert and oriented to person, place, and time. She displays normal reflexes. No cranial nerve deficit. She exhibits normal muscle tone. Coordination normal.  Skin: No rash noted. No erythema.  Psychiatric: She has a normal mood and affect. Her behavior is normal. Judgment and thought content normal.     Lab Results  Component Value Date   WBC 5.8 04/22/2013   HGB 8.1 Repeated and Verified* 04/22/2013   HCT 25.3* 04/22/2013   PLT 413* 04/22/2013   GLUCOSE 125 12/14/2012   CHOL 92 04/15/2013   TRIG 62 04/15/2013   HDL 40 04/15/2013   LDLCALC 30 04/15/2013   ALT 12 04/15/2013   AST 20 04/15/2013   NA 139 04/22/2013   K 4.0 04/22/2013   CL 104 10/01/2012   CREATININE 2.3* 04/22/2013   BUN 15 04/22/2013   CO2 27 12/14/2012   TSH 14.83* 04/15/2013   INR 1.1 09/08/2008        Assessment & Plan:

## 2013-05-28 NOTE — Assessment & Plan Note (Signed)
doing ok

## 2013-05-31 ENCOUNTER — Ambulatory Visit (HOSPITAL_BASED_OUTPATIENT_CLINIC_OR_DEPARTMENT_OTHER): Payer: Medicare Other

## 2013-05-31 ENCOUNTER — Ambulatory Visit (HOSPITAL_BASED_OUTPATIENT_CLINIC_OR_DEPARTMENT_OTHER): Payer: Medicare Other | Admitting: Internal Medicine

## 2013-05-31 ENCOUNTER — Other Ambulatory Visit (HOSPITAL_BASED_OUTPATIENT_CLINIC_OR_DEPARTMENT_OTHER): Payer: Medicare Other

## 2013-05-31 VITALS — BP 149/64 | HR 77 | Temp 97.6°F | Resp 17 | Ht 63.0 in | Wt 165.2 lb

## 2013-05-31 DIAGNOSIS — N039 Chronic nephritic syndrome with unspecified morphologic changes: Secondary | ICD-10-CM

## 2013-05-31 DIAGNOSIS — D631 Anemia in chronic kidney disease: Secondary | ICD-10-CM

## 2013-05-31 DIAGNOSIS — N189 Chronic kidney disease, unspecified: Secondary | ICD-10-CM

## 2013-05-31 LAB — COMPREHENSIVE METABOLIC PANEL (CC13)
ALT: 11 U/L (ref 0–55)
ANION GAP: 12 meq/L — AB (ref 3–11)
AST: 21 U/L (ref 5–34)
Albumin: 3.2 g/dL — ABNORMAL LOW (ref 3.5–5.0)
Alkaline Phosphatase: 103 U/L (ref 40–150)
BUN: 38.2 mg/dL — AB (ref 7.0–26.0)
CALCIUM: 9.7 mg/dL (ref 8.4–10.4)
CHLORIDE: 105 meq/L (ref 98–109)
CO2: 25 mEq/L (ref 22–29)
Creatinine: 2.3 mg/dL — ABNORMAL HIGH (ref 0.6–1.1)
Glucose: 102 mg/dl (ref 70–140)
Potassium: 3.5 mEq/L (ref 3.5–5.1)
Sodium: 142 mEq/L (ref 136–145)
Total Bilirubin: 0.38 mg/dL (ref 0.20–1.20)
Total Protein: 7.2 g/dL (ref 6.4–8.3)

## 2013-05-31 LAB — CBC WITH DIFFERENTIAL/PLATELET
BASO%: 1.4 % (ref 0.0–2.0)
BASOS ABS: 0.1 10*3/uL (ref 0.0–0.1)
EOS ABS: 0.4 10*3/uL (ref 0.0–0.5)
EOS%: 6.1 % (ref 0.0–7.0)
HCT: 29.5 % — ABNORMAL LOW (ref 34.8–46.6)
HGB: 9.4 g/dL — ABNORMAL LOW (ref 11.6–15.9)
LYMPH%: 33.3 % (ref 14.0–49.7)
MCH: 28.7 pg (ref 25.1–34.0)
MCHC: 31.9 g/dL (ref 31.5–36.0)
MCV: 89.9 fL (ref 79.5–101.0)
MONO#: 0.6 10*3/uL (ref 0.1–0.9)
MONO%: 9.3 % (ref 0.0–14.0)
NEUT%: 49.9 % (ref 38.4–76.8)
NEUTROS ABS: 3 10*3/uL (ref 1.5–6.5)
Platelets: 377 10*3/uL (ref 145–400)
RBC: 3.28 10*6/uL — AB (ref 3.70–5.45)
RDW: 15.7 % — AB (ref 11.2–14.5)
WBC: 5.9 10*3/uL (ref 3.9–10.3)
lymph#: 2 10*3/uL (ref 0.9–3.3)

## 2013-05-31 MED ORDER — DARBEPOETIN ALFA-POLYSORBATE 200 MCG/0.4ML IJ SOLN
200.0000 ug | Freq: Once | INTRAMUSCULAR | Status: AC
  Administered 2013-05-31: 200 ug via SUBCUTANEOUS
  Filled 2013-05-31: qty 0.4

## 2013-05-31 NOTE — Progress Notes (Signed)
[place] OFFICE PROGRESS NOTE  [name], MD [facility] [address] [place] [address]  DIAGNOSIS: Anemia in chronic renal disease  Chief Complaint  Patient presents with  . Follow-up    CURRENT THERAPY: Aranesp 200 mcg subcutaneous every 2 months for a hemoglobin less than 11.   INTERVAL HISTORY: [name] [name] 80 y.o. female with a history of anemia secondary to renal insufficiency is here for follow-up. She was last seen by me on 12/14/2012.  She has been followed by [facility] for the Sprint study which is about high and low blood pressure.  She has been a participant for 3 years or more. She reports being admitted 03/01/2013 for the study and as she was walking down for enrollment in the parking deck with palpitations and increased pounding. She collapsed in the office and subsequent EMS.  Next, she had flu and acute kidney failure follow receipt of dye contrast.  She underwent CABG x 4 (SVG d RCA, SVG OM1, distal circumflex and LIMA LAD) on March 05th.  She was hospitalized at [facility] until March 19th (Dr. Humphrey Rolls).  She went to [place] for rehab.  On March 20th,she celebrated her [age] birthday.  She left the [facility] on April 25th.   She is now at home in [place].  She lives alone and is now under advanced home care.  A friend brought her to this doctor's appointment.  She last received her aranesp on 04/22/2013 for a hemoglobin of 8.1.   MEDICAL HISTORY: Past Medical History  Diagnosis Date  . Unspecified essential hypertension     Dr [name]  . Other and unspecified hyperlipidemia   . Unspecified disorder resulting from impaired renal function   . Diverticulosis of colon with hemorrhage   . Unspecified vitamin D deficiency   . Unspecified asthma(493.90)   . Persistent disorder of initiating or maintaining sleep   . Lumbago   . Esophageal reflux   . Diverticulosis of colon (without mention of hemorrhage)   .  Depressive disorder, not elsewhere classified   . Anxiety state, unspecified   . Anemia, unspecified   . Unspecified osteomyelitis, site unspecified   . Myocardial infarction   . Thyroid disease    INTERIM HISTORY: has VITAMIN D DEFICIENCY; HYPERLIPIDEMIA; DEHYDRATION (VOLUME DEPLETION); ANXIETY; INSOMNIA, PERSISTENT; DEPRESSION; HYPERTENSION; CAD; CAROTID ARTERY DISEASE; PERIPHERAL VASCULAR DISEASE; BRONCHITIS NOT SPECIFIED AS ACUTE OR CHRONIC; ASTHMA; GERD; DIVERTICULOSIS, COLON; Diverticulosis of colon with hemorrhage; PANCREATITIS; RENAL INSUFFICIENCY; PYELONEPHRITIS; UNSPECIFIED DISORDER OF URETHRA&URINARY TRACT; LOW BACK PAIN; OSTEOMYELITIS; SYNCOPE; FATIGUE; Anemia in chronic renal disease; Cramps, extremity; Nausea alone; Unspecified constipation; Unspecified hypothyroidism; Cough; A-fib; CHF (congestive heart failure); PNA (pneumonia); Thyroid nodule; Influenza due to influenza A virus; Minimal cognitive impairment; Neuropathy; Acute non-ST segment elevation myocardial infarction; Disuse syndrome; and Restless leg on her problem list.    ALLERGIES:  is allergic to codeine sulfate; darvon; propoxyphene napsylate; sulfacetamide sodium-sulfur; sulfamethoxazole-trimethoprim; and zolpidem.  MEDICATIONS: has a current medication list which includes the following prescription(s): acetaminophen, albuterol, aspirin, atorvastatin, cholecalciferol, darbepoetin, furosemide, levothyroxine, lorazepam, metoprolol tartrate, mirtazapine, omeprazole, ondansetron, polyethylene glycol, sennosides-docusate sodium, and vitamin b-12, and the following Facility-Administered Medications: darbepoetin.  SURGICAL HISTORY:  Past Surgical History  Procedure Laterality Date  . Bone marrow biopsy    . Status post right carotid enterectomy    . Partial colectomy  1995    diverticulosis of colon iwth hemorrhage.   . Coronary artery bypass graft  03/25/13    x4. CABS ON PUMP-Surgeon Jomarie Longs.    REVIEW OF SYSTEMS:    Constitutional: Denies fevers, chills; 14 lb weight lost doing recent hospitalization but now regaining her  Eyes: Denies blurriness of vision Ears, nose, mouth, throat, and face: Denies mucositis or sore throat Respiratory: Denies cough, dyspnea or wheezes Cardiovascular: Denies palpitation, chest discomfort or lower extremity swelling Gastrointestinal:  Denies nausea, heartburn or change in bowel habits Skin: Denies abnormal skin rashes Lymphatics: Denies new lymphadenopathy or easy bruising Neurological:Denies numbness, tingling or new weaknesses Behavioral/Psych: Mood is stable, no new changes  All other systems were reviewed with the patient and are negative.  PHYSICAL EXAMINATION: ECOG PERFORMANCE STATUS: 0 - Asymptomatic  Blood pressure 149/64, pulse 77, temperature 97.6 F (36.4 C), temperature source Oral, resp. rate 17, height 5' 3" (1.6 m), weight 165 lb 3.2 oz (74.934 kg), SpO2 98.00%. (178 lbs last visit).   GENERAL:alert, no distress and comfortable; elderly female who is well developed, well nourished.  SKIN: skin color, texture, turgor are normal, no rashes or significant lesions; scar on chest well healed.  EYES: normal, Conjunctiva are pink and non-injected, sclera clear OROPHARYNX:no exudate, no erythema and lips, buccal mucosa, and tongue normal  NECK: supple, thyroid normal size, non-tender, without nodularity LYMPH:  no palpable lymphadenopathy in the cervical, axillary or supraclavicular LUNGS: clear to auscultation and percussion with normal breathing effort HEART: regular rate & rhythm and no murmurs and no lower extremity edema ABDOMEN:abdomen soft, non-tender and normal bowel sounds Musculoskeletal:no cyanosis of digits and no clubbing  NEURO: alert & oriented x 3 with fluent speech, no focal motor/sensory deficits  LABORATORY DATA: Results for orders placed in visit on 05/31/13 (from the past 48 hour(s))  COMPREHENSIVE METABOLIC PANEL (SF68)     Status:  Abnormal   Collection Time    05/31/13 10:16 AM      Result Value Ref Range   Sodium 142  136 - 145 mEq/L   Potassium 3.5  3.5 - 5.1 mEq/L   Chloride 105  98 - 109 mEq/L   CO2 25  22 - 29 mEq/L   Glucose 102  70 - 140 mg/dl   BUN 38.2 (*) 7.0 - 26.0 mg/dL   Creatinine 2.3 (*) 0.6 - 1.1 mg/dL   Total Bilirubin 0.38  0.20 - 1.20 mg/dL   Alkaline Phosphatase 103  40 - 150 U/L   AST 21  5 - 34 U/L   ALT 11  0 - 55 U/L   Total Protein 7.2  6.4 - 8.3 g/dL   Albumin 3.2 (*) 3.5 - 5.0 g/dL   Calcium 9.7  8.4 - 10.4 mg/dL   Anion Gap 12 (*) 3 - 11 mEq/L  CBC WITH DIFFERENTIAL     Status: Abnormal   Collection Time    05/31/13 10:16 AM      Result Value Ref Range   WBC 5.9  3.9 - 10.3 10e3/uL   NEUT# 3.0  1.5 - 6.5 10e3/uL   HGB 9.4 (*) 11.6 - 15.9 g/dL   HCT 29.5 (*) 34.8 - 46.6 %   Platelets 377  145 - 400 10e3/uL   MCV 89.9  79.5 - 101.0 fL   MCH 28.7  25.1 - 34.0 pg   MCHC 31.9  31.5 - 36.0 g/dL   RBC 3.28 (*) 3.70 - 5.45 10e6/uL   RDW 15.7 (*) 11.2 - 14.5 %   lymph# 2.0  0.9 - 3.3 10e3/uL   MONO# 0.6  0.1 - 0.9 10e3/uL   Eosinophils Absolute 0.4  0.0 - 0.5 10e3/uL   Basophils Absolute 0.1  0.0 - 0.1 10e3/uL   NEUT% 49.9  38.4 - 76.8 %   LYMPH% 33.3  14.0 - 49.7 %   MONO% 9.3  0.0 - 14.0 %   EOS% 6.1  0.0 - 7.0 %   BASO% 1.4  0.0 - 2.0 %    Labs:  Lab Results  Component Value Date   WBC 5.9 05/31/2013   HGB 9.4* 05/31/2013   HCT 29.5* 05/31/2013   MCV 89.9 05/31/2013   PLT 377 05/31/2013   NEUTROABS 3.0 05/31/2013      Chemistry      Component Value Date/Time   NA 142 05/31/2013 1016   NA 139 04/22/2013   NA 137 10/01/2012 0926   K 3.5 05/31/2013 1016   K 4.0 04/22/2013   CL 104 10/01/2012 0926   CL 105 06/29/2012 1143   CO2 25 05/31/2013 1016   CO2 25 10/01/2012 0926   BUN 38.2* 05/31/2013 1016   BUN 15 04/22/2013   BUN 35* 10/01/2012 0926   CREATININE 2.3* 05/31/2013 1016   CREATININE 2.3* 04/22/2013   CREATININE 1.8* 10/01/2012 0926   GLU 82 04/22/2013      Component Value  Date/Time   CALCIUM 9.7 05/31/2013 1016   CALCIUM 9.2 10/01/2012 0926   ALKPHOS 103 05/31/2013 1016   ALKPHOS 74 04/15/2013   AST 21 05/31/2013 1016   AST 20 04/15/2013   ALT 11 05/31/2013 1016   ALT 12 04/15/2013   BILITOT 0.38 05/31/2013 1016   BILITOT 0.7 10/01/2012 0926     Basic Metabolic Panel:  Recent Labs Lab 05/31/13 1016  NA 142  K 3.5  CO2 25  GLUCOSE 102  BUN 38.2*  CREATININE 2.3*  CALCIUM 9.7   GFR Estimated Creatinine Clearance: 18.9 ml/min (by C-G formula based on Cr of 2.3). Liver Function Tests:  Recent Labs Lab 05/31/13 1016  AST 21  ALT 11  ALKPHOS 103  BILITOT 0.38  PROT 7.2  ALBUMIN 3.2*   CBC:  Recent Labs Lab 05/31/13 1016  WBC 5.9  NEUTROABS 3.0  HGB 9.4*  HCT 29.5*  MCV 89.9  PLT 377   Studies:  No results found.   RADIOGRAPHIC STUDIES: No results found.  ASSESSMENT: [name] [name] 80 y.o. female with a history of Anemia in chronic renal disease   PLAN:  1. Anemia in chronic renal disease.   --We will continue CBC every 2 months and give aranesp for hemoglobin less than 11.   Patient is agreeable to this plan.   2. Chronic kidney disease.  --Avoid nephrotoxins and counseled on continued adequate hydration.  Her creatinine is 2.3 today.    3. Follow-up.   --RTC in 6 months with repeat labs including CBC, Chemistries and CBC every 2 months as outlined above.    All questions were answered. The patient knows to call the clinic with any problems, questions or concerns. We can certainly see the patient much sooner if necessary.  I spent 10 minutes counseling the patient face to face. The total time spent in the appointment was 15 minutes.    Concha Norway, MD 05/31/2013 11:16 AM

## 2013-06-01 DIAGNOSIS — E538 Deficiency of other specified B group vitamins: Secondary | ICD-10-CM | POA: Diagnosis not present

## 2013-06-01 DIAGNOSIS — I509 Heart failure, unspecified: Secondary | ICD-10-CM | POA: Diagnosis not present

## 2013-06-01 DIAGNOSIS — Z48812 Encounter for surgical aftercare following surgery on the circulatory system: Secondary | ICD-10-CM | POA: Diagnosis not present

## 2013-06-01 DIAGNOSIS — I4891 Unspecified atrial fibrillation: Secondary | ICD-10-CM | POA: Diagnosis not present

## 2013-06-01 DIAGNOSIS — I10 Essential (primary) hypertension: Secondary | ICD-10-CM | POA: Diagnosis not present

## 2013-06-01 DIAGNOSIS — G2581 Restless legs syndrome: Secondary | ICD-10-CM | POA: Diagnosis not present

## 2013-06-02 ENCOUNTER — Ambulatory Visit: Payer: Self-pay | Admitting: Internal Medicine

## 2013-06-03 ENCOUNTER — Telehealth (HOSPITAL_COMMUNITY): Payer: Self-pay | Admitting: *Deleted

## 2013-06-03 DIAGNOSIS — G2581 Restless legs syndrome: Secondary | ICD-10-CM | POA: Diagnosis not present

## 2013-06-03 DIAGNOSIS — I10 Essential (primary) hypertension: Secondary | ICD-10-CM | POA: Diagnosis not present

## 2013-06-03 DIAGNOSIS — Z48812 Encounter for surgical aftercare following surgery on the circulatory system: Secondary | ICD-10-CM | POA: Diagnosis not present

## 2013-06-03 DIAGNOSIS — I4891 Unspecified atrial fibrillation: Secondary | ICD-10-CM | POA: Diagnosis not present

## 2013-06-03 DIAGNOSIS — I509 Heart failure, unspecified: Secondary | ICD-10-CM | POA: Diagnosis not present

## 2013-06-03 DIAGNOSIS — E538 Deficiency of other specified B group vitamins: Secondary | ICD-10-CM | POA: Diagnosis not present

## 2013-06-03 NOTE — Telephone Encounter (Signed)
Message left to contact cardiac rehab program at [facility].  Awaiting MD signature from [place]

## 2013-06-04 ENCOUNTER — Telehealth: Payer: Self-pay | Admitting: Internal Medicine

## 2013-06-04 ENCOUNTER — Telehealth: Payer: Self-pay | Admitting: Cardiovascular Disease

## 2013-06-04 DIAGNOSIS — I4891 Unspecified atrial fibrillation: Secondary | ICD-10-CM | POA: Diagnosis not present

## 2013-06-04 DIAGNOSIS — E538 Deficiency of other specified B group vitamins: Secondary | ICD-10-CM | POA: Diagnosis not present

## 2013-06-04 DIAGNOSIS — G2581 Restless legs syndrome: Secondary | ICD-10-CM | POA: Diagnosis not present

## 2013-06-04 DIAGNOSIS — I509 Heart failure, unspecified: Secondary | ICD-10-CM | POA: Diagnosis not present

## 2013-06-04 DIAGNOSIS — I10 Essential (primary) hypertension: Secondary | ICD-10-CM | POA: Diagnosis not present

## 2013-06-04 DIAGNOSIS — Z48812 Encounter for surgical aftercare following surgery on the circulatory system: Secondary | ICD-10-CM | POA: Diagnosis not present

## 2013-06-04 NOTE — Telephone Encounter (Signed)
not able to reach pt by phone or lm. [facility]ts and aksed that i call home number. mailed schedule for july thru december. pt home number rings busy.

## 2013-06-04 NOTE — Telephone Encounter (Signed)
New message    patient having pt at home order  expire at the end of month    Verbal order  To start outpatient cardiac rehab starting 6/1.

## 2013-06-04 NOTE — Telephone Encounter (Signed)
**Note De-Identified  Obfuscation** I left a detailed message on [name] [name]'s VM stating that Dr [name] is not in the office today and that I am forwarding this message to Altha Harm, Dr Kyla Balzarine nurse. I asked that she call back if she needs order before then.

## 2013-06-07 DIAGNOSIS — E538 Deficiency of other specified B group vitamins: Secondary | ICD-10-CM | POA: Diagnosis not present

## 2013-06-07 DIAGNOSIS — I10 Essential (primary) hypertension: Secondary | ICD-10-CM | POA: Diagnosis not present

## 2013-06-07 DIAGNOSIS — G2581 Restless legs syndrome: Secondary | ICD-10-CM | POA: Diagnosis not present

## 2013-06-07 DIAGNOSIS — I509 Heart failure, unspecified: Secondary | ICD-10-CM | POA: Diagnosis not present

## 2013-06-07 DIAGNOSIS — Z48812 Encounter for surgical aftercare following surgery on the circulatory system: Secondary | ICD-10-CM | POA: Diagnosis not present

## 2013-06-07 DIAGNOSIS — I4891 Unspecified atrial fibrillation: Secondary | ICD-10-CM | POA: Diagnosis not present

## 2013-06-08 DIAGNOSIS — G2581 Restless legs syndrome: Secondary | ICD-10-CM | POA: Diagnosis not present

## 2013-06-08 DIAGNOSIS — Z48812 Encounter for surgical aftercare following surgery on the circulatory system: Secondary | ICD-10-CM | POA: Diagnosis not present

## 2013-06-08 DIAGNOSIS — I10 Essential (primary) hypertension: Secondary | ICD-10-CM | POA: Diagnosis not present

## 2013-06-08 DIAGNOSIS — I509 Heart failure, unspecified: Secondary | ICD-10-CM | POA: Diagnosis not present

## 2013-06-08 DIAGNOSIS — I4891 Unspecified atrial fibrillation: Secondary | ICD-10-CM | POA: Diagnosis not present

## 2013-06-08 DIAGNOSIS — E538 Deficiency of other specified B group vitamins: Secondary | ICD-10-CM | POA: Diagnosis not present

## 2013-06-09 DIAGNOSIS — I4891 Unspecified atrial fibrillation: Secondary | ICD-10-CM | POA: Diagnosis not present

## 2013-06-09 DIAGNOSIS — E538 Deficiency of other specified B group vitamins: Secondary | ICD-10-CM | POA: Diagnosis not present

## 2013-06-09 DIAGNOSIS — G2581 Restless legs syndrome: Secondary | ICD-10-CM | POA: Diagnosis not present

## 2013-06-09 DIAGNOSIS — I10 Essential (primary) hypertension: Secondary | ICD-10-CM | POA: Diagnosis not present

## 2013-06-09 DIAGNOSIS — Z48812 Encounter for surgical aftercare following surgery on the circulatory system: Secondary | ICD-10-CM | POA: Diagnosis not present

## 2013-06-09 DIAGNOSIS — I509 Heart failure, unspecified: Secondary | ICD-10-CM | POA: Diagnosis not present

## 2013-06-10 ENCOUNTER — Ambulatory Visit: Payer: Self-pay | Admitting: Cardiovascular Disease

## 2013-06-10 ENCOUNTER — Encounter (HOSPITAL_COMMUNITY): Payer: Self-pay

## 2013-06-10 NOTE — Telephone Encounter (Signed)
Follow up     Home health will discharge her next week----they do not want to have a lapse in time before pt [facility]. Want Dr [name] to sign pt up for [facility] soon so that she can start first week in june

## 2013-06-11 NOTE — Telephone Encounter (Signed)
REHAB  FORM HERE AWAITING  DR NISHAN'S  SIG  WILL FAX  ONCE  COMPLETED./CY

## 2013-06-15 ENCOUNTER — Telehealth: Payer: Self-pay | Admitting: Cardiovascular Disease

## 2013-06-15 DIAGNOSIS — G2581 Restless legs syndrome: Secondary | ICD-10-CM | POA: Diagnosis not present

## 2013-06-15 DIAGNOSIS — E538 Deficiency of other specified B group vitamins: Secondary | ICD-10-CM | POA: Diagnosis not present

## 2013-06-15 DIAGNOSIS — Z48812 Encounter for surgical aftercare following surgery on the circulatory system: Secondary | ICD-10-CM | POA: Diagnosis not present

## 2013-06-15 DIAGNOSIS — I4891 Unspecified atrial fibrillation: Secondary | ICD-10-CM | POA: Diagnosis not present

## 2013-06-15 DIAGNOSIS — I509 Heart failure, unspecified: Secondary | ICD-10-CM | POA: Diagnosis not present

## 2013-06-15 DIAGNOSIS — I10 Essential (primary) hypertension: Secondary | ICD-10-CM | POA: Diagnosis not present

## 2013-06-15 NOTE — Telephone Encounter (Signed)
New message     Pt want to know if Dr [name] has signed the form to start cardiac rehab

## 2013-06-16 DIAGNOSIS — Z48812 Encounter for surgical aftercare following surgery on the circulatory system: Secondary | ICD-10-CM | POA: Diagnosis not present

## 2013-06-16 DIAGNOSIS — I10 Essential (primary) hypertension: Secondary | ICD-10-CM | POA: Diagnosis not present

## 2013-06-16 DIAGNOSIS — I4891 Unspecified atrial fibrillation: Secondary | ICD-10-CM | POA: Diagnosis not present

## 2013-06-16 DIAGNOSIS — I509 Heart failure, unspecified: Secondary | ICD-10-CM | POA: Diagnosis not present

## 2013-06-16 DIAGNOSIS — E538 Deficiency of other specified B group vitamins: Secondary | ICD-10-CM | POA: Diagnosis not present

## 2013-06-16 DIAGNOSIS — G2581 Restless legs syndrome: Secondary | ICD-10-CM | POA: Diagnosis not present

## 2013-06-16 NOTE — Telephone Encounter (Signed)
PT  NOTIFIED  FORM  FAXED  TO  [facility] TODAY ./CY

## 2013-06-17 DIAGNOSIS — Z48812 Encounter for surgical aftercare following surgery on the circulatory system: Secondary | ICD-10-CM | POA: Diagnosis not present

## 2013-06-17 DIAGNOSIS — I509 Heart failure, unspecified: Secondary | ICD-10-CM | POA: Diagnosis not present

## 2013-06-17 DIAGNOSIS — E538 Deficiency of other specified B group vitamins: Secondary | ICD-10-CM | POA: Diagnosis not present

## 2013-06-17 DIAGNOSIS — G2581 Restless legs syndrome: Secondary | ICD-10-CM | POA: Diagnosis not present

## 2013-06-17 DIAGNOSIS — I10 Essential (primary) hypertension: Secondary | ICD-10-CM | POA: Diagnosis not present

## 2013-06-17 DIAGNOSIS — I4891 Unspecified atrial fibrillation: Secondary | ICD-10-CM | POA: Diagnosis not present

## 2013-06-30 DIAGNOSIS — I509 Heart failure, unspecified: Secondary | ICD-10-CM | POA: Diagnosis not present

## 2013-06-30 DIAGNOSIS — I4891 Unspecified atrial fibrillation: Secondary | ICD-10-CM | POA: Diagnosis not present

## 2013-06-30 DIAGNOSIS — Z48812 Encounter for surgical aftercare following surgery on the circulatory system: Secondary | ICD-10-CM | POA: Diagnosis not present

## 2013-06-30 DIAGNOSIS — G2581 Restless legs syndrome: Secondary | ICD-10-CM | POA: Diagnosis not present

## 2013-06-30 DIAGNOSIS — I10 Essential (primary) hypertension: Secondary | ICD-10-CM | POA: Diagnosis not present

## 2013-06-30 DIAGNOSIS — E538 Deficiency of other specified B group vitamins: Secondary | ICD-10-CM | POA: Diagnosis not present

## 2013-07-02 ENCOUNTER — Telehealth (HOSPITAL_COMMUNITY): Payer: Self-pay | Admitting: *Deleted

## 2013-07-02 NOTE — Telephone Encounter (Signed)
Message copied by Rowe Pavy on Fri Jul 02, 2013  2:48 PM ------      Message from: [facility], DAVID      Created: Fri Jul 02, 2013  8:15 AM      Regarding: RE: ok to proceed with cardiac rehab       Ok to proceed as tolerated.  If any dyspnea on exertion or chest discomfort, please stop exercise.  Then, we will check a hemoglobin.  For further recommendations regarding level of activity, also make cardiology aware and request their input.        Thanks      ----- Message -----         From: Rowe Pavy, RN         Sent: 07/01/2013  12:19 PM           To: Concha Norway, MD      Subject: ok to proceed with cardiac rehab                               Pt is eligible to attend cardiac rehab s/p cabg  X 4 at [facility] 03/2013.      Pt is tentatively scheduled to start next week.  Pt seen by you recently for anemia.  Last blood work was 5/11 with Hemoglobin 9.4 next check is 7/6.  Next office visit is 10/26.            Ok to proceed with exercise?  Any restrictions of activity?            Thanks so much for your input,      Psychologist, clinical        ------

## 2013-07-02 NOTE — Telephone Encounter (Signed)
Message copied by Rowe Pavy on Fri Jul 02, 2013  2:48 PM ------      Message from: [facility], DAVID      Created: Fri Jul 02, 2013  8:15 AM      Regarding: RE: ok to proceed with cardiac rehab       Ok to proceed as tolerated.  If any dyspnea on exertion or chest discomfort, please stop exercise.  Then, we will check a hemoglobin.  For further recommendations regarding level of activity, also make cardiology aware and request their input.        Thanks      ----- Message -----         From: Rowe Pavy, RN         Sent: 07/01/2013  12:19 PM           To: Concha Norway, MD      Subject: ok to proceed with cardiac rehab                               Pt is eligible to attend cardiac rehab s/p cabg  X 4 at [facility] 03/2013.      Pt is tentatively scheduled to start next week.  Pt seen by you recently for anemia.  Last blood work was 5/11 with Hemoglobin 9.4 next check is 7/6.  Next office visit is 10/26.            Ok to proceed with exercise?  Any restrictions of activity?            Thanks so much for your input,      Psychologist, clinical        ------

## 2013-07-02 NOTE — Telephone Encounter (Signed)
Message copied by Rowe Pavy on Fri Jul 02, 2013  2:49 PM ------      Message from: Josue Hector      Created: Fri Jul 02, 2013 12:07 AM      Regarding: RE: Parameters/restrictions for Cardiac Rehab       No restrictions to exercise      Systolic BP 99991111  HR 0000000      ----- Message -----         From: Rowe Pavy, RN         Sent: 07/01/2013  12:35 PM           To: Josue Hector, MD      Subject: Parameters/restrictions for Cardiac Rehab                      Pt scheduled to start cardia rehab next week s/p CABG x 4 03/2013 at [facility].  Seen in follow up by you on 05/19/13/  Noted in history [id number] LICA stenosis per carotid duplex on 5/2 next follow up in 6 months.            Any restrictions for exercise?  Any BP parameters for exercise?            Thank you for your input      Maurice Small RN       ------

## 2013-07-08 ENCOUNTER — Encounter (HOSPITAL_COMMUNITY)
Admission: RE | Admit: 2013-07-08 | Discharge: 2013-07-08 | Disposition: A | Discharge status: Home / Self Care | Payer: Medicare Other | Source: Ambulatory Visit | Attending: Cardiovascular Disease | Admitting: Cardiovascular Disease

## 2013-07-08 DIAGNOSIS — Z5189 Encounter for other specified aftercare: Secondary | ICD-10-CM | POA: Insufficient documentation

## 2013-07-08 DIAGNOSIS — I509 Heart failure, unspecified: Secondary | ICD-10-CM | POA: Insufficient documentation

## 2013-07-08 DIAGNOSIS — I739 Peripheral vascular disease, unspecified: Secondary | ICD-10-CM | POA: Insufficient documentation

## 2013-07-08 DIAGNOSIS — I4891 Unspecified atrial fibrillation: Secondary | ICD-10-CM | POA: Insufficient documentation

## 2013-07-08 DIAGNOSIS — I252 Old myocardial infarction: Secondary | ICD-10-CM | POA: Insufficient documentation

## 2013-07-08 DIAGNOSIS — I2510 Atherosclerotic heart disease of native coronary artery without angina pectoris: Secondary | ICD-10-CM | POA: Insufficient documentation

## 2013-07-08 DIAGNOSIS — I6529 Occlusion and stenosis of unspecified carotid artery: Secondary | ICD-10-CM | POA: Insufficient documentation

## 2013-07-08 NOTE — Progress Notes (Signed)
Cardiac Rehab Medication Review by a Pharmacist  Does the patient  feel that his/her medications are working for him/her?  yes  Has the patient been experiencing any side effects to the medications prescribed?  no  Does the patient measure his/her own blood pressure or blood glucose at home?  yes   Does the patient have any problems obtaining medications due to transportation or finances?   no  Understanding of regimen: poor Understanding of indications: poor Potential of compliance: excellent    Pharmacist comments:  [name] [name] is a pleasant 80 yo F who presents today for cardiac rehab orientation. Patient explains that since her cardiac events that she has been experiencing some memory loss. Patients states that her daughter puts all her medication into a pill box and can't recall all the medication she takes in a day. Her daughter works at [employer] and states she helps her keep track of her medication. Patient states she will bring her list on next visit. Will make updates as needed.    [name] [name] 07/08/2013 8:38 AM

## 2013-07-12 ENCOUNTER — Encounter (HOSPITAL_COMMUNITY)
Admission: RE | Admit: 2013-07-12 | Discharge: 2013-07-12 | Disposition: A | Discharge status: Home / Self Care | Payer: Medicare Other | Source: Ambulatory Visit | Attending: Cardiovascular Disease | Admitting: Cardiovascular Disease

## 2013-07-12 DIAGNOSIS — Z5189 Encounter for other specified aftercare: Secondary | ICD-10-CM | POA: Diagnosis not present

## 2013-07-12 DIAGNOSIS — I6529 Occlusion and stenosis of unspecified carotid artery: Secondary | ICD-10-CM | POA: Diagnosis not present

## 2013-07-12 DIAGNOSIS — I2510 Atherosclerotic heart disease of native coronary artery without angina pectoris: Secondary | ICD-10-CM | POA: Diagnosis not present

## 2013-07-12 DIAGNOSIS — I509 Heart failure, unspecified: Secondary | ICD-10-CM | POA: Diagnosis not present

## 2013-07-12 DIAGNOSIS — I252 Old myocardial infarction: Secondary | ICD-10-CM | POA: Diagnosis not present

## 2013-07-12 DIAGNOSIS — I739 Peripheral vascular disease, unspecified: Secondary | ICD-10-CM | POA: Diagnosis not present

## 2013-07-12 DIAGNOSIS — I4891 Unspecified atrial fibrillation: Secondary | ICD-10-CM | POA: Diagnosis not present

## 2013-07-12 NOTE — Progress Notes (Signed)
Pt in today for her first day of exercise at the [facility] cardiac rehab phase II  program.  Pt tolerated light exercise with some complaints of fatigue and slight shortness of breath. Pt admitted to very little exercise since her cardiac event although she went to a rehab facility and received home PT after her discharge from the rehab facility.  Monitor shows Sr with ST depression with no noted ectopy.  PHQ2 1.  Pt with history of anxiety and takes ativan daily. Pt reports short term goal is to maintain current weight.  Will monitor daily weights and encourage pt to be active on non exercise days.  Long term goals is to be stronger.  Continue to monitor her progress toward this goal. Maurice Small RN, BSN

## 2013-07-13 DIAGNOSIS — Z48812 Encounter for surgical aftercare following surgery on the circulatory system: Secondary | ICD-10-CM | POA: Diagnosis not present

## 2013-07-13 DIAGNOSIS — G2581 Restless legs syndrome: Secondary | ICD-10-CM | POA: Diagnosis not present

## 2013-07-13 DIAGNOSIS — I10 Essential (primary) hypertension: Secondary | ICD-10-CM | POA: Diagnosis not present

## 2013-07-13 DIAGNOSIS — I4891 Unspecified atrial fibrillation: Secondary | ICD-10-CM | POA: Diagnosis not present

## 2013-07-13 DIAGNOSIS — E538 Deficiency of other specified B group vitamins: Secondary | ICD-10-CM | POA: Diagnosis not present

## 2013-07-13 DIAGNOSIS — I509 Heart failure, unspecified: Secondary | ICD-10-CM | POA: Diagnosis not present

## 2013-07-14 ENCOUNTER — Encounter (HOSPITAL_COMMUNITY)
Admission: RE | Admit: 2013-07-14 | Discharge: 2013-07-14 | Disposition: A | Discharge status: Home / Self Care | Payer: Medicare Other | Source: Ambulatory Visit | Attending: Cardiovascular Disease | Admitting: Cardiovascular Disease

## 2013-07-14 DIAGNOSIS — Z5189 Encounter for other specified aftercare: Secondary | ICD-10-CM | POA: Diagnosis not present

## 2013-07-16 ENCOUNTER — Encounter (HOSPITAL_COMMUNITY)
Admission: RE | Admit: 2013-07-16 | Discharge: 2013-07-16 | Disposition: A | Discharge status: Home / Self Care | Payer: Medicare Other | Source: Ambulatory Visit | Attending: Cardiovascular Disease | Admitting: Cardiovascular Disease

## 2013-07-16 ENCOUNTER — Encounter (HOSPITAL_COMMUNITY): Payer: Medicare Other

## 2013-07-16 DIAGNOSIS — D1801 Hemangioma of skin and subcutaneous tissue: Secondary | ICD-10-CM | POA: Diagnosis not present

## 2013-07-16 DIAGNOSIS — D692 Other nonthrombocytopenic purpura: Secondary | ICD-10-CM | POA: Diagnosis not present

## 2013-07-16 DIAGNOSIS — L570 Actinic keratosis: Secondary | ICD-10-CM | POA: Diagnosis not present

## 2013-07-16 DIAGNOSIS — Z85828 Personal history of other malignant neoplasm of skin: Secondary | ICD-10-CM | POA: Diagnosis not present

## 2013-07-16 DIAGNOSIS — L819 Disorder of pigmentation, unspecified: Secondary | ICD-10-CM | POA: Diagnosis not present

## 2013-07-16 DIAGNOSIS — L821 Other seborrheic keratosis: Secondary | ICD-10-CM | POA: Diagnosis not present

## 2013-07-16 DIAGNOSIS — Z5189 Encounter for other specified aftercare: Secondary | ICD-10-CM | POA: Diagnosis not present

## 2013-07-19 ENCOUNTER — Encounter (HOSPITAL_COMMUNITY)
Admission: RE | Admit: 2013-07-19 | Discharge: 2013-07-19 | Disposition: A | Discharge status: Home / Self Care | Payer: Medicare Other | Source: Ambulatory Visit | Attending: Cardiovascular Disease | Admitting: Cardiovascular Disease

## 2013-07-19 DIAGNOSIS — Z5189 Encounter for other specified aftercare: Secondary | ICD-10-CM | POA: Diagnosis not present

## 2013-07-20 ENCOUNTER — Telehealth: Payer: Self-pay | Admitting: Cardiovascular Disease

## 2013-07-20 NOTE — Telephone Encounter (Signed)
New problem    Pt want to go back to work every other weekend and need a note saying she can go back to work part time. 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  Please advise pt.

## 2013-07-20 NOTE — Telephone Encounter (Signed)
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

## 2013-07-21 ENCOUNTER — Encounter (HOSPITAL_COMMUNITY)
Admission: RE | Admit: 2013-07-21 | Discharge: 2013-07-21 | Disposition: A | Discharge status: Home / Self Care | Payer: Medicare Other | Source: Ambulatory Visit | Attending: Cardiovascular Disease | Admitting: Cardiovascular Disease

## 2013-07-21 DIAGNOSIS — Z951 Presence of aortocoronary bypass graft: Secondary | ICD-10-CM | POA: Diagnosis not present

## 2013-07-21 DIAGNOSIS — I509 Heart failure, unspecified: Secondary | ICD-10-CM | POA: Insufficient documentation

## 2013-07-21 DIAGNOSIS — I2510 Atherosclerotic heart disease of native coronary artery without angina pectoris: Secondary | ICD-10-CM | POA: Diagnosis not present

## 2013-07-21 DIAGNOSIS — I739 Peripheral vascular disease, unspecified: Secondary | ICD-10-CM | POA: Diagnosis not present

## 2013-07-21 DIAGNOSIS — I6529 Occlusion and stenosis of unspecified carotid artery: Secondary | ICD-10-CM | POA: Diagnosis not present

## 2013-07-21 DIAGNOSIS — Z5189 Encounter for other specified aftercare: Secondary | ICD-10-CM | POA: Insufficient documentation

## 2013-07-21 DIAGNOSIS — I214 Non-ST elevation (NSTEMI) myocardial infarction: Secondary | ICD-10-CM | POA: Insufficient documentation

## 2013-07-21 DIAGNOSIS — I4891 Unspecified atrial fibrillation: Secondary | ICD-10-CM | POA: Diagnosis not present

## 2013-07-25 NOTE — Telephone Encounter (Signed)
Ok to right note that she can work part time

## 2013-07-26 ENCOUNTER — Other Ambulatory Visit (HOSPITAL_BASED_OUTPATIENT_CLINIC_OR_DEPARTMENT_OTHER): Payer: Medicare Other

## 2013-07-26 ENCOUNTER — Encounter (HOSPITAL_COMMUNITY)
Admission: RE | Admit: 2013-07-26 | Discharge: 2013-07-26 | Disposition: A | Discharge status: Home / Self Care | Payer: Medicare Other | Source: Ambulatory Visit | Attending: Cardiovascular Disease | Admitting: Cardiovascular Disease

## 2013-07-26 ENCOUNTER — Ambulatory Visit (HOSPITAL_BASED_OUTPATIENT_CLINIC_OR_DEPARTMENT_OTHER): Payer: Medicare Other

## 2013-07-26 ENCOUNTER — Telehealth: Payer: Self-pay | Admitting: *Deleted

## 2013-07-26 ENCOUNTER — Encounter: Payer: Self-pay | Admitting: *Deleted

## 2013-07-26 VITALS — BP 135/74 | HR 80 | Temp 98.1°F

## 2013-07-26 DIAGNOSIS — N189 Chronic kidney disease, unspecified: Secondary | ICD-10-CM

## 2013-07-26 DIAGNOSIS — Z5189 Encounter for other specified aftercare: Secondary | ICD-10-CM | POA: Diagnosis not present

## 2013-07-26 DIAGNOSIS — D631 Anemia in chronic kidney disease: Secondary | ICD-10-CM

## 2013-07-26 DIAGNOSIS — N039 Chronic nephritic syndrome with unspecified morphologic changes: Secondary | ICD-10-CM

## 2013-07-26 LAB — CBC WITH DIFFERENTIAL/PLATELET
BASO%: 1 % (ref 0.0–2.0)
Basophils Absolute: 0.1 10*3/uL (ref 0.0–0.1)
EOS ABS: 0.8 10*3/uL — AB (ref 0.0–0.5)
EOS%: 11.1 % — ABNORMAL HIGH (ref 0.0–7.0)
HEMATOCRIT: 31.9 % — AB (ref 34.8–46.6)
HEMOGLOBIN: 10.5 g/dL — AB (ref 11.6–15.9)
LYMPH%: 35.8 % (ref 14.0–49.7)
MCH: 28.9 pg (ref 25.1–34.0)
MCHC: 32.8 g/dL (ref 31.5–36.0)
MCV: 88.2 fL (ref 79.5–101.0)
MONO#: 0.6 10*3/uL (ref 0.1–0.9)
MONO%: 8.3 % (ref 0.0–14.0)
NEUT#: 3 10*3/uL (ref 1.5–6.5)
NEUT%: 43.8 % (ref 38.4–76.8)
PLATELETS: 297 10*3/uL (ref 145–400)
RBC: 3.62 10*6/uL — ABNORMAL LOW (ref 3.70–5.45)
RDW: 15.6 % — ABNORMAL HIGH (ref 11.2–14.5)
WBC: 6.8 10*3/uL (ref 3.9–10.3)
lymph#: 2.4 10*3/uL (ref 0.9–3.3)

## 2013-07-26 MED ORDER — DARBEPOETIN ALFA-POLYSORBATE 200 MCG/0.4ML IJ SOLN
200.0000 ug | Freq: Once | INTRAMUSCULAR | Status: AC
  Administered 2013-07-26: 200 ug via SUBCUTANEOUS
  Filled 2013-07-26: qty 0.4

## 2013-07-26 NOTE — Telephone Encounter (Signed)
PT  AWARE NOTE LEFT AT  FRONT DESK  FOR PICK UP .Adonis Housekeeper

## 2013-07-26 NOTE — Telephone Encounter (Signed)
Called patient about missed appointment.  She did not have it on her schedule.  She will come in today for lab and injection appointment.

## 2013-07-28 ENCOUNTER — Encounter (HOSPITAL_COMMUNITY)
Admission: RE | Admit: 2013-07-28 | Discharge: 2013-07-28 | Disposition: A | Discharge status: Home / Self Care | Payer: Medicare Other | Source: Ambulatory Visit | Attending: Cardiovascular Disease | Admitting: Cardiovascular Disease

## 2013-07-28 DIAGNOSIS — Z5189 Encounter for other specified aftercare: Secondary | ICD-10-CM | POA: Diagnosis not present

## 2013-07-30 ENCOUNTER — Encounter (HOSPITAL_COMMUNITY)
Admission: RE | Admit: 2013-07-30 | Discharge: 2013-07-30 | Disposition: A | Discharge status: Home / Self Care | Payer: Medicare Other | Source: Ambulatory Visit | Attending: Cardiovascular Disease | Admitting: Cardiovascular Disease

## 2013-07-30 DIAGNOSIS — Z5189 Encounter for other specified aftercare: Secondary | ICD-10-CM | POA: Diagnosis not present

## 2013-08-02 ENCOUNTER — Telehealth: Payer: Self-pay | Admitting: *Deleted

## 2013-08-02 ENCOUNTER — Other Ambulatory Visit: Payer: Self-pay | Admitting: *Deleted

## 2013-08-02 ENCOUNTER — Encounter (HOSPITAL_COMMUNITY)
Admission: RE | Admit: 2013-08-02 | Discharge: 2013-08-02 | Disposition: A | Discharge status: Home / Self Care | Payer: Medicare Other | Source: Ambulatory Visit | Attending: Cardiovascular Disease | Admitting: Cardiovascular Disease

## 2013-08-02 DIAGNOSIS — Z5189 Encounter for other specified aftercare: Secondary | ICD-10-CM | POA: Diagnosis not present

## 2013-08-02 MED ORDER — LORAZEPAM 0.5 MG PO TABS: 0.5000 mg | ORAL_TABLET | Freq: Four times a day (QID) | ORAL | Status: DC | PRN

## 2013-08-02 NOTE — Telephone Encounter (Signed)
PT  AWARE  APPT  MADE  FOR   09-08-13 AT 1:45 PM .Adonis Housekeeper

## 2013-08-02 NOTE — Telephone Encounter (Signed)
LEFT  MESSAGE TO CALL BACK   NEEDS   F/U  APPT   ALSO  CHECKING  ON  REFILL  REQUEST  FOR  ATIVAN  PMD  SHOULD  FILL ./CY

## 2013-08-04 ENCOUNTER — Encounter (HOSPITAL_COMMUNITY)
Admission: RE | Admit: 2013-08-04 | Discharge: 2013-08-04 | Disposition: A | Discharge status: Home / Self Care | Payer: Medicare Other | Source: Ambulatory Visit | Attending: Cardiovascular Disease | Admitting: Cardiovascular Disease

## 2013-08-04 DIAGNOSIS — Z5189 Encounter for other specified aftercare: Secondary | ICD-10-CM | POA: Diagnosis not present

## 2013-08-06 ENCOUNTER — Encounter (HOSPITAL_COMMUNITY)
Admission: RE | Admit: 2013-08-06 | Discharge: 2013-08-06 | Disposition: A | Discharge status: Home / Self Care | Payer: Medicare Other | Source: Ambulatory Visit | Attending: Cardiovascular Disease | Admitting: Cardiovascular Disease

## 2013-08-06 DIAGNOSIS — Z5189 Encounter for other specified aftercare: Secondary | ICD-10-CM | POA: Diagnosis not present

## 2013-08-09 ENCOUNTER — Telehealth: Payer: Self-pay | Admitting: Internal Medicine

## 2013-08-09 ENCOUNTER — Encounter (HOSPITAL_COMMUNITY)
Admission: RE | Admit: 2013-08-09 | Discharge: 2013-08-09 | Disposition: A | Discharge status: Home / Self Care | Payer: Medicare Other | Source: Ambulatory Visit | Attending: Cardiovascular Disease | Admitting: Cardiovascular Disease

## 2013-08-09 DIAGNOSIS — Z5189 Encounter for other specified aftercare: Secondary | ICD-10-CM | POA: Diagnosis not present

## 2013-08-09 NOTE — Telephone Encounter (Signed)
Following up on orders sent

## 2013-08-11 ENCOUNTER — Encounter (HOSPITAL_COMMUNITY)
Admission: RE | Admit: 2013-08-11 | Discharge: 2013-08-11 | Disposition: A | Discharge status: Home / Self Care | Payer: Medicare Other | Source: Ambulatory Visit | Attending: Cardiovascular Disease | Admitting: Cardiovascular Disease

## 2013-08-11 DIAGNOSIS — Z5189 Encounter for other specified aftercare: Secondary | ICD-10-CM | POA: Diagnosis not present

## 2013-08-13 ENCOUNTER — Encounter (HOSPITAL_COMMUNITY)
Admission: RE | Admit: 2013-08-13 | Discharge: 2013-08-13 | Disposition: A | Discharge status: Home / Self Care | Payer: Medicare Other | Source: Ambulatory Visit | Attending: Cardiovascular Disease | Admitting: Cardiovascular Disease

## 2013-08-13 DIAGNOSIS — Z5189 Encounter for other specified aftercare: Secondary | ICD-10-CM | POA: Diagnosis not present

## 2013-08-16 ENCOUNTER — Encounter (HOSPITAL_COMMUNITY)
Admission: RE | Admit: 2013-08-16 | Discharge: 2013-08-16 | Disposition: A | Discharge status: Home / Self Care | Payer: Medicare Other | Source: Ambulatory Visit | Attending: Cardiovascular Disease | Admitting: Cardiovascular Disease

## 2013-08-16 DIAGNOSIS — Z5189 Encounter for other specified aftercare: Secondary | ICD-10-CM | POA: Diagnosis not present

## 2013-08-18 ENCOUNTER — Encounter (HOSPITAL_COMMUNITY)
Admission: RE | Admit: 2013-08-18 | Discharge: 2013-08-18 | Disposition: A | Discharge status: Home / Self Care | Payer: Medicare Other | Source: Ambulatory Visit | Attending: Cardiovascular Disease | Admitting: Cardiovascular Disease

## 2013-08-18 DIAGNOSIS — Z5189 Encounter for other specified aftercare: Secondary | ICD-10-CM | POA: Diagnosis not present

## 2013-08-20 ENCOUNTER — Encounter (HOSPITAL_COMMUNITY)
Admission: RE | Admit: 2013-08-20 | Discharge: 2013-08-20 | Disposition: A | Discharge status: Home / Self Care | Payer: Medicare Other | Source: Ambulatory Visit | Attending: Cardiovascular Disease | Admitting: Cardiovascular Disease

## 2013-08-20 DIAGNOSIS — Z5189 Encounter for other specified aftercare: Secondary | ICD-10-CM | POA: Diagnosis not present

## 2013-08-23 ENCOUNTER — Encounter (HOSPITAL_COMMUNITY)
Admission: RE | Admit: 2013-08-23 | Discharge: 2013-08-23 | Disposition: A | Discharge status: Home / Self Care | Payer: Medicare Other | Source: Ambulatory Visit | Attending: Cardiovascular Disease | Admitting: Cardiovascular Disease

## 2013-08-23 DIAGNOSIS — I739 Peripheral vascular disease, unspecified: Secondary | ICD-10-CM | POA: Diagnosis not present

## 2013-08-23 DIAGNOSIS — I2510 Atherosclerotic heart disease of native coronary artery without angina pectoris: Secondary | ICD-10-CM | POA: Diagnosis present

## 2013-08-23 DIAGNOSIS — I6529 Occlusion and stenosis of unspecified carotid artery: Secondary | ICD-10-CM | POA: Insufficient documentation

## 2013-08-23 DIAGNOSIS — Z5189 Encounter for other specified aftercare: Secondary | ICD-10-CM | POA: Insufficient documentation

## 2013-08-23 DIAGNOSIS — I4891 Unspecified atrial fibrillation: Secondary | ICD-10-CM | POA: Insufficient documentation

## 2013-08-23 DIAGNOSIS — I214 Non-ST elevation (NSTEMI) myocardial infarction: Secondary | ICD-10-CM | POA: Diagnosis not present

## 2013-08-23 DIAGNOSIS — Z951 Presence of aortocoronary bypass graft: Secondary | ICD-10-CM | POA: Diagnosis not present

## 2013-08-23 DIAGNOSIS — I509 Heart failure, unspecified: Secondary | ICD-10-CM | POA: Diagnosis not present

## 2013-08-23 NOTE — Progress Notes (Signed)
[name] [name] 80 y.o. female Nutrition Note Spoke with pt.  Nutrition Plan and Nutrition Survey goals reviewed with pt. Pt is following Step 2 of the Therapeutic Lifestyle Changes diet according to MEDFICTS results. Pt eats out at [employer] most days for lunch for convenience to get her vegetables because pt states, "I don't cook veggies at home." Pt wants to lose wt. Pt reports she lost 14 lb while in the hospital and c/o gaining 3-4 lb back. Muscle loss that occurs with rapid wt loss discussed. Pt has been trying to lose wt by exercising more. Wt loss tips reviewed. Pt states she is watching sodium intake. Pt consuming more sodium than desired due to eating out frequently. Age-appropriate diet and wt recommendations discussed. Pt expressed understanding of the information reviewed. Pt aware of nutrition education classes offered and plans on attending nutrition classes.  Nutrition Diagnosis   Food-and nutrition-related knowledge deficit related to lack of exposure to information as related to diagnosis of: ? CVD    Overweight related to excessive energy intake as evidenced by a BMI of 29.7  Nutrition RX/ Estimated Daily Nutrition Needs for: wt loss  1200-1400 Kcal, 30-35 gm fat, 9-11 gm sat fat, 1.2-1.4 gm trans-fat, <1500 mg sodium  Nutrition Intervention   Pt's individual nutrition plan reviewed with pt.   Benefits of adopting Therapeutic Lifestyle Changes discussed when Medficts reviewed.   Pt to attend the Portion Distortion class   Pt to attend the  ? Nutrition I class                     ? Nutrition II class - met; 07/20/13   Continue client-centered nutrition education by RD, as part of interdisciplinary care. Goal(s)   Pt to identify food quantities necessary to achieve: ? wt loss to a goal wt of 156-158 lb (70.9-72.1 kg) at graduation from cardiac rehab.    Pt to describe the benefit of including fruits, vegetables, whole grains, and low-fat dairy products in a heart healthy meal  plan. Monitor and Evaluate progress toward nutrition goal with team. Nutrition Risk: Change to Moderate Derek Mound, M.Ed, RD, LDN, CDE 08/23/2013 12:19 PM

## 2013-08-24 NOTE — Telephone Encounter (Signed)
Colletta Maryland with [facility] is calling to follow-up on physical therapy orders that she says she faxed 3x in May, June and again on July 30th. She is trying to see if we have received.  If not she would like call back at Ph#: [phone] so that she can fax again. Please advise.

## 2013-08-25 ENCOUNTER — Telehealth (HOSPITAL_COMMUNITY): Payer: Self-pay | Admitting: *Deleted

## 2013-08-25 ENCOUNTER — Other Ambulatory Visit: Payer: Medicare Other

## 2013-08-25 ENCOUNTER — Ambulatory Visit (INDEPENDENT_AMBULATORY_CARE_PROVIDER_SITE_OTHER)
Admission: RE | Admit: 2013-08-25 | Discharge: 2013-08-25 | Disposition: A | Discharge status: Home / Self Care | Payer: Medicare Other | Source: Ambulatory Visit | Attending: Internal Medicine | Admitting: Internal Medicine

## 2013-08-25 ENCOUNTER — Encounter (HOSPITAL_COMMUNITY): Payer: Medicare Other

## 2013-08-25 ENCOUNTER — Encounter: Payer: Self-pay | Admitting: Internal Medicine

## 2013-08-25 ENCOUNTER — Ambulatory Visit (INDEPENDENT_AMBULATORY_CARE_PROVIDER_SITE_OTHER): Payer: Medicare Other | Admitting: Internal Medicine

## 2013-08-25 VITALS — BP 163/80 | HR 76 | Temp 97.9°F | Wt 165.0 lb

## 2013-08-25 DIAGNOSIS — I10 Essential (primary) hypertension: Secondary | ICD-10-CM | POA: Diagnosis not present

## 2013-08-25 DIAGNOSIS — M25579 Pain in unspecified ankle and joints of unspecified foot: Secondary | ICD-10-CM | POA: Diagnosis not present

## 2013-08-25 DIAGNOSIS — W1809XA Striking against other object with subsequent fall, initial encounter: Secondary | ICD-10-CM

## 2013-08-25 DIAGNOSIS — R29898 Other symptoms and signs involving the musculoskeletal system: Secondary | ICD-10-CM

## 2013-08-25 DIAGNOSIS — M545 Low back pain, unspecified: Secondary | ICD-10-CM | POA: Diagnosis not present

## 2013-08-25 DIAGNOSIS — S8990XA Unspecified injury of unspecified lower leg, initial encounter: Secondary | ICD-10-CM | POA: Diagnosis not present

## 2013-08-25 DIAGNOSIS — M7989 Other specified soft tissue disorders: Secondary | ICD-10-CM | POA: Diagnosis not present

## 2013-08-25 DIAGNOSIS — M25572 Pain in left ankle and joints of left foot: Secondary | ICD-10-CM | POA: Insufficient documentation

## 2013-08-25 DIAGNOSIS — W1800XA Striking against unspecified object with subsequent fall, initial encounter: Secondary | ICD-10-CM | POA: Insufficient documentation

## 2013-08-25 DIAGNOSIS — I2510 Atherosclerotic heart disease of native coronary artery without angina pectoris: Secondary | ICD-10-CM

## 2013-08-25 DIAGNOSIS — S99919A Unspecified injury of unspecified ankle, initial encounter: Secondary | ICD-10-CM | POA: Diagnosis not present

## 2013-08-25 MED ORDER — PREDNISONE 10 MG PO TABS: ORAL_TABLET | ORAL | Status: DC

## 2013-08-25 NOTE — Assessment & Plan Note (Addendum)
x3-4 week ?etiology LS Xray May need an MRI Prednisone 10 mg: take 4 tabs a day x 3 days; then 3 tabs a day x 4 days; then 2 tabs a day x 4 days, then 1 tab a day x 6 days, then stop. Take pc.  Potential benefits of short term steroid  use as well as potential risks  and complications were explained to the patient and were aknowledged.

## 2013-08-25 NOTE — Telephone Encounter (Signed)
Pt called to alert rehab staff that she fell at home on yesterday evening.  Pt remarked that her left leg gave way on her and she fell in the door frame.  Pt awoke this morning with bruising on her leg and was having difficulty bearing weight on it.  Pt advised to contact her primary MD for further evaluation.  Pt asked rehab staff to contact her physician on her behalf.  Called Dr. Alain Marion office.  Obtained appt for pt for today at 3:00.  Pt planned to call a friend to take her to the appt.  Pt advised to let her daughter know she had fallen at home.  Pt verbalized she would. Cherre Huger, BSN

## 2013-08-25 NOTE — Assessment & Plan Note (Signed)
ACE Xray

## 2013-08-25 NOTE — Assessment & Plan Note (Signed)
Continue with current prescription therapy as reflected on the Med list.  

## 2013-08-25 NOTE — Assessment & Plan Note (Signed)
8/15 Probable L sciatica

## 2013-08-25 NOTE — Progress Notes (Signed)
Pre visit review using our clinic review tool, if applicable. No additional management support is needed unless otherwise documented below in the visit note. 

## 2013-08-25 NOTE — Telephone Encounter (Signed)
Left message for [name] [name] to fax orders back to [phone].

## 2013-08-25 NOTE — Progress Notes (Signed)
Subjective:     Leg Pain  The incident occurred 12 to 24 hours ago. The incident occurred in the yard. The injury mechanism was a fall. The pain is present in the left leg. The pain is moderate. The pain has been fluctuating since onset. Pertinent negatives include no numbness. She reports no foreign bodies present. She has tried nothing for the symptoms.    C/o L leg would give way- weak x 1 mo C/o pain in R thigh  Had an MI/CABG in Feb 2015 Hospitalized at [facility] for syncope in March Subsequently had chest pain and cath done Had CABG with Dr Clementeen Graham  SVG d RCA, SVG OM1, distal circumflex and LIMA LAD  Had brief periop afib Rx with amidarone and left hospital in [place] Back home from [place] 2 wks ago. In PT at home now 5/15). The patient presents for a follow-up of  chronic hypertension, chronic dyslipidemia, anemia, CRI, vit D def  Wt Readings from Last 3 Encounters:  08/25/13 165 lb (74.844 kg)  07/08/13 164 lb 14.5 oz (74.8 kg)  05/31/13 165 lb 3.2 oz (74.934 kg)   BP Readings from Last 3 Encounters:  08/25/13 163/80  07/26/13 135/74  07/08/13 140/70     Review of Systems  Constitutional: Negative for chills, activity change, appetite change, fatigue and unexpected weight change.  HENT: Negative for congestion, mouth sores and sinus pressure.   Eyes: Negative for visual disturbance.  Respiratory: Negative for cough and chest tightness.   Cardiovascular: Positive for leg swelling.  Gastrointestinal: Negative for nausea and abdominal pain.  Genitourinary: Negative for frequency, difficulty urinating and vaginal pain.  Musculoskeletal: Negative for back pain and gait problem.       Cramps  Skin: Negative for pallor and rash.  Neurological: Negative for dizziness, tremors, weakness, numbness and headaches.  Psychiatric/Behavioral: Positive for sleep disturbance. Negative for suicidal ideas and confusion. The patient is nervous/anxious.        Objective:   Physical  Exam  Constitutional: She is oriented to person, place, and time. She appears well-developed. No distress.  Obese   HENT:  Head: Normocephalic.  Right Ear: External ear normal.  Left Ear: External ear normal.  Nose: Nose normal.  Mouth/Throat: Oropharynx is clear and moist.  Eyes: Conjunctivae are normal. Pupils are equal, round, and reactive to light. Right eye exhibits no discharge. Left eye exhibits no discharge.  Neck: Normal range of motion. Neck supple. No JVD present. No tracheal deviation present. No thyromegaly present.  Cardiovascular: Normal rate, regular rhythm and normal heart sounds.   Pulmonary/Chest: No stridor. No respiratory distress. She has no wheezes.  Abdominal: Soft. Bowel sounds are normal. She exhibits no distension and no mass. There is no tenderness. There is no rebound and no guarding.  Musculoskeletal: She exhibits tenderness (LS spine, hips). She exhibits no edema.  Lymphadenopathy:    She has no cervical adenopathy.  Neurological: She is alert and oriented to person, place, and time. She displays normal reflexes. No cranial nerve deficit. She exhibits normal muscle tone. Coordination normal.  Skin: No rash noted. No erythema.  Psychiatric: She has a normal mood and affect. Her behavior is normal. Judgment and thought content normal.  Walker L hip w/nl ROM Knee extensors on L knee 5-/5 L ankle w/lat swelling   Lab Results  Component Value Date   WBC 6.8 07/26/2013   HGB 10.5* 07/26/2013   HCT 31.9* 07/26/2013   PLT 297 07/26/2013   GLUCOSE 102  05/31/2013   CHOL 92 04/15/2013   TRIG 62 04/15/2013   HDL 40 04/15/2013   LDLCALC 30 04/15/2013   ALT 11 05/31/2013   AST 21 05/31/2013   NA 142 05/31/2013   K 3.5 05/31/2013   CL 104 10/01/2012   CREATININE 2.3* 05/31/2013   BUN 38.2* 05/31/2013   CO2 25 05/31/2013   TSH 14.83* 04/15/2013   INR 1.1 09/08/2008        Assessment & Plan:

## 2013-08-27 ENCOUNTER — Telehealth (HOSPITAL_COMMUNITY): Payer: Self-pay | Admitting: *Deleted

## 2013-08-27 ENCOUNTER — Encounter (HOSPITAL_COMMUNITY): Payer: Medicare Other

## 2013-08-27 NOTE — Telephone Encounter (Signed)
Message copied by Rowe Pavy on Fri Aug 27, 2013  9:24 AM ------      Message from: Cassandria Anger      Created: Thu Aug 26, 2013 10:46 PM      Regarding: RE: Madaline Brilliant to return to exercise       P.S.:            Her ankle Xray is likely ok, however it may be a small fx as well. If ankle pain/swelling is much worse with Cardiac rehab activity - let me know immediately, pls      Thx            ----- Message -----         From: Rowe Pavy, RN         Sent: 08/26/2013  11:19 AM           To: Cassandria Anger, MD      Subject: Ok to return to exercise                                       Dr. Alain Marion            Patient seen by you on yesterday for fall she had at home.  Pt participates in cardiac rehab after her heart surgery earlier this year.            When may pt resume exercise?  Any restrictions to activity?            For rehab purposes pt would need to have protective covering over her foot to participate in exercise.            Thank you so much, Aaradhana is a pleasure to work with!      Maurice Small RN       ------

## 2013-08-27 NOTE — Telephone Encounter (Signed)
Message copied by Rowe Pavy on Fri Aug 27, 2013  9:25 AM ------      Message from: Cassandria Anger      Created: Thu Aug 26, 2013  6:15 PM      Regarding: RE: Madaline Brilliant to return to exercise       Hi Artice Bergerson,      [name] [name]'s spine X ray was ok. She can re-start exercise program on Fri or Monday. Re: restrictions "play by the ear"...      Thanks,      AP                        ----- Message -----         From: Rowe Pavy, RN         Sent: 08/26/2013  11:19 AM           To: Cassandria Anger, MD      Subject: Ok to return to exercise                                       Dr. Alain Marion            Patient seen by you on yesterday for fall she had at home.  Pt participates in cardiac rehab after her heart surgery earlier this year.            When may pt resume exercise?  Any restrictions to activity?            For rehab purposes pt would need to have protective covering over her foot to participate in exercise.            Thank you so much, [name] [name] is a pleasure to work with!      Maurice Small RN       ------

## 2013-08-27 NOTE — Telephone Encounter (Signed)
Received ok for pt to resume cardiac rehab.  Pt called and informed that she may return to exercise on Monday by Dr. Alain Marion.  Pt verbalized understanding.  Cherre Huger, BSN

## 2013-08-30 ENCOUNTER — Encounter (HOSPITAL_COMMUNITY)
Admission: RE | Admit: 2013-08-30 | Discharge: 2013-08-30 | Disposition: A | Discharge status: Home / Self Care | Payer: Medicare Other | Source: Ambulatory Visit | Attending: Cardiovascular Disease | Admitting: Cardiovascular Disease

## 2013-08-30 DIAGNOSIS — Z5189 Encounter for other specified aftercare: Secondary | ICD-10-CM | POA: Diagnosis not present

## 2013-08-30 NOTE — Progress Notes (Signed)
Pt returned to exercise today with ok from Dr. Alain Marion.  Pt with ace bandage to her left foot and was able to tolerate wearing a shoe.  Pt drove herself to rehab and walked in unassisted.  Pt tolerated light exercise with no complaints.  Modified pt exercise equipment to avoid walking.  Pt spent 20 minutes on the arm crank and 10 minutes on the stepper with her left foot resting on the floor. Pt brought her exercise band in to be evaluated by the exercise specialist.  Pt given instructions on how to use the bands for home exercise to help with strength training.  Pt readily admits to not exercising even before the fall.  Pt reports that she was at a restaurant and her leg gave way on her where she had to grab a chair so she would not fall.  Pt also reports that her knee feels like it is pulsating under the knee cap.   Pt plans to talk to Dr. Alain Marion when she sees him on Wednesday.  Pt did call her daughters to let them know about the fall with much advisement by rehab staff.  Cherre Huger, BSN

## 2013-09-01 ENCOUNTER — Encounter (HOSPITAL_COMMUNITY)
Admission: RE | Admit: 2013-09-01 | Discharge: 2013-09-01 | Disposition: A | Discharge status: Home / Self Care | Payer: Medicare Other | Source: Ambulatory Visit | Attending: Cardiovascular Disease | Admitting: Cardiovascular Disease

## 2013-09-01 ENCOUNTER — Encounter: Payer: Self-pay | Admitting: Internal Medicine

## 2013-09-01 ENCOUNTER — Ambulatory Visit (INDEPENDENT_AMBULATORY_CARE_PROVIDER_SITE_OTHER): Payer: Medicare Other | Admitting: Internal Medicine

## 2013-09-01 VITALS — BP 138/72 | HR 74 | Temp 97.9°F | Wt 165.0 lb

## 2013-09-01 DIAGNOSIS — N259 Disorder resulting from impaired renal tubular function, unspecified: Secondary | ICD-10-CM

## 2013-09-01 DIAGNOSIS — Z5189 Encounter for other specified aftercare: Secondary | ICD-10-CM | POA: Diagnosis not present

## 2013-09-01 DIAGNOSIS — M5442 Lumbago with sciatica, left side: Secondary | ICD-10-CM

## 2013-09-01 DIAGNOSIS — I2510 Atherosclerotic heart disease of native coronary artery without angina pectoris: Secondary | ICD-10-CM | POA: Diagnosis not present

## 2013-09-01 DIAGNOSIS — E039 Hypothyroidism, unspecified: Secondary | ICD-10-CM

## 2013-09-01 DIAGNOSIS — M25572 Pain in left ankle and joints of left foot: Secondary | ICD-10-CM

## 2013-09-01 DIAGNOSIS — M25579 Pain in unspecified ankle and joints of unspecified foot: Secondary | ICD-10-CM

## 2013-09-01 DIAGNOSIS — R29898 Other symptoms and signs involving the musculoskeletal system: Secondary | ICD-10-CM

## 2013-09-01 DIAGNOSIS — M5430 Sciatica, unspecified side: Secondary | ICD-10-CM | POA: Diagnosis not present

## 2013-09-01 NOTE — Assessment & Plan Note (Signed)
Clinically better - Sport Med ref

## 2013-09-01 NOTE — Progress Notes (Signed)
Subjective:     Leg Pain  The incident occurred more than 1 week ago. The incident occurred in the yard. The injury mechanism was a fall. The pain is present in the left leg. The pain is moderate. The pain has been fluctuating since onset. Pertinent negatives include no numbness. She reports no foreign bodies present. She has tried nothing for the symptoms.    C/o L leg would give way- weak x 1 mo; it happened again on 8/10 C/o pain in R thigh  Had an MI/CABG in Feb 2015 Hospitalized at [facility] for syncope in March Subsequently had chest pain and cath done Had CABG with Dr Clementeen Graham  SVG d RCA, SVG OM1, distal circumflex and LIMA LAD  Had brief periop afib Rx with amidarone and left hospital in [place] Back home from [place] 2 wks ago. In PT at home now 5/15). The patient presents for a follow-up of  chronic hypertension, chronic dyslipidemia, anemia, CRI, vit D def  Wt Readings from Last 3 Encounters:  09/01/13 165 lb (74.844 kg)  08/25/13 165 lb (74.844 kg)  07/08/13 164 lb 14.5 oz (74.8 kg)   BP Readings from Last 3 Encounters:  09/01/13 138/72  08/25/13 163/80  07/26/13 135/74     Review of Systems  Constitutional: Negative for chills, activity change, appetite change, fatigue and unexpected weight change.  HENT: Negative for congestion, mouth sores and sinus pressure.   Eyes: Negative for visual disturbance.  Respiratory: Negative for cough and chest tightness.   Cardiovascular: Positive for leg swelling.  Gastrointestinal: Negative for nausea and abdominal pain.  Genitourinary: Negative for frequency, difficulty urinating and vaginal pain.  Musculoskeletal: Negative for back pain and gait problem.       Cramps  Skin: Negative for pallor and rash.  Neurological: Negative for dizziness, tremors, weakness, numbness and headaches.  Psychiatric/Behavioral: Positive for sleep disturbance. Negative for suicidal ideas and confusion. The patient is nervous/anxious.         Objective:   Physical Exam  Constitutional: She is oriented to person, place, and time. She appears well-developed. No distress.  Obese   HENT:  Head: Normocephalic.  Right Ear: External ear normal.  Left Ear: External ear normal.  Nose: Nose normal.  Mouth/Throat: Oropharynx is clear and moist.  Eyes: Conjunctivae are normal. Pupils are equal, round, and reactive to light. Right eye exhibits no discharge. Left eye exhibits no discharge.  Neck: Normal range of motion. Neck supple. No JVD present. No tracheal deviation present. No thyromegaly present.  Cardiovascular: Normal rate, regular rhythm and normal heart sounds.   Pulmonary/Chest: No stridor. No respiratory distress. She has no wheezes.  Abdominal: Soft. Bowel sounds are normal. She exhibits no distension and no mass. There is no tenderness. There is no rebound and no guarding.  Musculoskeletal: She exhibits tenderness (LS spine, hips). She exhibits no edema.  Lymphadenopathy:    She has no cervical adenopathy.  Neurological: She is alert and oriented to person, place, and time. She displays normal reflexes. No cranial nerve deficit. She exhibits normal muscle tone. Coordination normal.  Skin: No rash noted. No erythema.  Psychiatric: She has a normal mood and affect. Her behavior is normal. Judgment and thought content normal.  Walker L hip w/nl ROM Knee extensors on L knee 5-/5 L ankle w/lat swelling   Lab Results  Component Value Date   WBC 6.8 07/26/2013   HGB 10.5* 07/26/2013   HCT 31.9* 07/26/2013   PLT 297 07/26/2013  GLUCOSE 102 05/31/2013   CHOL 92 04/15/2013   TRIG 62 04/15/2013   HDL 40 04/15/2013   LDLCALC 30 04/15/2013   ALT 11 05/31/2013   AST 21 05/31/2013   NA 142 05/31/2013   K 3.5 05/31/2013   CL 104 10/01/2012   CREATININE 2.3* 05/31/2013   BUN 38.2* 05/31/2013   CO2 25 05/31/2013   TSH 14.83* 04/15/2013   INR 1.1 09/08/2008        Assessment & Plan:

## 2013-09-01 NOTE — Assessment & Plan Note (Signed)
MSK/OA  08/25/13 Probable L sciatica 08/25/13 LS spine X ray: FINDINGS:  Five lumbar type vertebral bodies are visualized. Vertebral body  height is well maintained with the exception of the L3 vertebral  body which shows mild central compression. These changes are stable  from prior MRI examination from 2004. Disc space narrowing is noted  at multiple levels facet hypertrophic changes and osteophytic  changes are seen. Diffuse aortic calcifications are noted without  aneurysmal dilatation.  IMPRESSION:  Chronic changes without acute abnormality.  Electronically Signed  By: Inez Catalina M.D.  On: 08/25/2013 16:36  Much better on Prednisone   Sports med ref --- ?MRI need, can she cont to do card rehab; does she need a cane?

## 2013-09-01 NOTE — Assessment & Plan Note (Signed)
Monitor labs 

## 2013-09-01 NOTE — Assessment & Plan Note (Signed)
08/25/13 X ray: IMPRESSION:  Small ossific fragments at the medial and lateral malleoli, may be  chronic but cannot exclude small acute avulsion fractures. No other  acute fracture or dislocation about the left ankle.  Clinically better - Sport Med ref

## 2013-09-01 NOTE — Progress Notes (Signed)
Pre visit review using our clinic review tool, if applicable. No additional management support is needed unless otherwise documented below in the visit note. 

## 2013-09-03 ENCOUNTER — Telehealth (HOSPITAL_COMMUNITY): Payer: Self-pay | Admitting: Internal Medicine

## 2013-09-03 ENCOUNTER — Encounter (HOSPITAL_COMMUNITY): Payer: Medicare Other

## 2013-09-06 ENCOUNTER — Encounter (HOSPITAL_COMMUNITY): Payer: Medicare Other

## 2013-09-07 ENCOUNTER — Encounter: Payer: Self-pay | Admitting: Family Medicine

## 2013-09-07 ENCOUNTER — Ambulatory Visit (INDEPENDENT_AMBULATORY_CARE_PROVIDER_SITE_OTHER): Payer: Medicare Other | Admitting: Family Medicine

## 2013-09-07 ENCOUNTER — Other Ambulatory Visit (INDEPENDENT_AMBULATORY_CARE_PROVIDER_SITE_OTHER): Payer: Medicare Other

## 2013-09-07 ENCOUNTER — Ambulatory Visit (INDEPENDENT_AMBULATORY_CARE_PROVIDER_SITE_OTHER)
Admission: RE | Admit: 2013-09-07 | Discharge: 2013-09-07 | Disposition: A | Discharge status: Home / Self Care | Payer: Medicare Other | Source: Ambulatory Visit | Attending: Family Medicine | Admitting: Family Medicine

## 2013-09-07 VITALS — BP 132/70 | HR 69 | Ht 63.0 in | Wt 164.0 lb

## 2013-09-07 DIAGNOSIS — M25562 Pain in left knee: Secondary | ICD-10-CM

## 2013-09-07 DIAGNOSIS — M25569 Pain in unspecified knee: Secondary | ICD-10-CM

## 2013-09-07 DIAGNOSIS — I2510 Atherosclerotic heart disease of native coronary artery without angina pectoris: Secondary | ICD-10-CM | POA: Diagnosis not present

## 2013-09-07 DIAGNOSIS — M25579 Pain in unspecified ankle and joints of unspecified foot: Secondary | ICD-10-CM | POA: Diagnosis not present

## 2013-09-07 DIAGNOSIS — M1712 Unilateral primary osteoarthritis, left knee: Secondary | ICD-10-CM

## 2013-09-07 DIAGNOSIS — M1710 Unilateral primary osteoarthritis, unspecified knee: Secondary | ICD-10-CM | POA: Diagnosis not present

## 2013-09-07 DIAGNOSIS — M25572 Pain in left ankle and joints of left foot: Secondary | ICD-10-CM

## 2013-09-07 DIAGNOSIS — S8990XA Unspecified injury of unspecified lower leg, initial encounter: Secondary | ICD-10-CM | POA: Diagnosis not present

## 2013-09-07 NOTE — Patient Instructions (Addendum)
Very nice to meet you Ice 20 minutes 2 times daily. Usually after activity and before bed. Exercises 3 times a week. Start this in 5 days.  Wear brace with a lot of walking You have a meniscal tear and osteoarthritis.  With your ankle you have a small avulsion fracture of the lateral malleolus but treated more like a sprain.  Take tylenol 325 mg three times a day is the best evidence based medicine we have for arthritis.  Glucosamine sulfate 1500mg  daily Vitamin D 2000 IU daily Cortisone injections are an option if these interventions do not seem to make a difference or need more relief. We tried one today.  If cortisone injections do not help, there are different types of shots that may help but they take longer to take effect.  We can discuss this at follow up.  It's important that you continue to stay active. Controlling your weight is important.  Water aerobics and cycling with low resistance are the best two types of exercise for arthritis. Come back and see me in 4 weeks.

## 2013-09-07 NOTE — Assessment & Plan Note (Signed)
Looking at patient's x-rays I do think that this is likely more secondary to arthritis true avulsion fractures. Patient has good range of motion in the day and swelling seems to be improving. Encourage her to continue to wear the ace wrap for another week but otherwise not necessary. Patient was given home exercise program for range of motion. Patient likely will do well. Patient's gait is normal overall but does walk with a walker. We'll continue to monitor.

## 2013-09-07 NOTE — Progress Notes (Signed)
[name] [address], [place] [address] Phone: [phone] Subjective:    I'm seeing this patient by the request  of:  [name], MD   CC: Left leg pain  [id number] [name] [name] is a 80 y.o. female coming in with complaint of left leg pain. Patient was seen by primary care provider one week ago after falling a week prior to that. Patient states that her ankle which was found to have a potential avulsion fracture is doing fairly well she continues to have left leg pain. Patient points over the medial aspect of her knee. Patient states it worse even if she bears any weight on it. States that the pain is severe. 7/10.  No radiatoin but hurts even at night.  No weakness that she can tell but harder to do ADL's     Past medical history, social, surgical and family history all reviewed in electronic medical record.   Review of Systems: No headache, visual changes, nausea, vomiting, diarrhea, constipation, dizziness, abdominal pain, skin rash, fevers, chills, night sweats, weight loss, swollen lymph nodes, body aches, joint swelling, muscle aches, chest pain, shortness of breath, mood changes.   Objective Blood pressure 132/70, pulse 69, height 5\' 3"  (1.6 m), weight 164 lb (74.39 kg), SpO2 97.00%.  General: No apparent distress alert and oriented x3 mood and affect normal, dressed appropriately.  HEENT: Pupils equal, extraocular movements intact  Respiratory: Patient's speak in full sentences and does not appear short of breath  Cardiovascular: No lower extremity edema, non tender, no erythema  Skin: Warm dry intact with no signs of infection or rash on extremities or on axial skeleton.  Abdomen: Soft nontender  Neuro: Cranial nerves II through XII are intact, neurovascularly intact in all extremities with 2+ DTRs and 2+ pulses.  Lymph: No lymphadenopathy of posterior or anterior cervical chain or axillae bilaterally.  Gait ambulates with  kyphosis and a walker MSK:  mild tender with near full range of motion and good stability and symmetric strength and tone of shoulders, elbows, wrist, hips bilaterally. Multiple osteoarthritic changes in multiple joints. Knee: Left Normal to inspection with no erythema or effusion or obvious bony abnormalities. Severely tender to palpation over the medial joint line Lax last 5 of extension and less 5 of flexion Ligaments with solid consistent endpoints including ACL, PCL, LCL, MCL. Positive Mcmurray's, Apley's, and Thessalonian tests. Non painful patellar compression. Patellar glide with minimal crepitus. Patellar and quadriceps tendons unremarkable. Hamstring and quadriceps strength is normal.   contralateral knee has some minimal tenderness to palpation as well.  MSK US performed of: Left knee This study was ordered, performed, and interpreted by Charlann Boxer D.O.  Knee: All structures visualized. Anteromedial meniscus does have a severe degenerative tear noted. Patient also has narrowing of the medial joint line, Anterolateral, posteromedial, and posterolateral menisci unremarkable without tearing, fraying, effusion, or displacement. Patellar Tendon unremarkable on long and transverse views without effusion. No abnormality of prepatellar bursa. LCL and MCL unremarkable on long and transverse views. No abnormality of origin of medial or lateral head of the gastrocnemius.  IMPRESSION: Moderate osteoarthritic changes and degenerative meniscal tear  Procedure: Real-time Ultrasound Guided Injection of left knee Device: GE Logiq E  Ultrasound guided injection is preferred based studies that show increased duration, increased effect, greater accuracy, decreased procedural pain, increased response rate, and decreased cost with ultrasound guided versus blind injection.  Verbal informed consent obtained.  Time-out conducted.  Noted no overlying  erythema, induration, or other signs of local  infection.  Skin prepped in a sterile fashion.  Local anesthesia: Topical Ethyl chloride.  With sterile technique and under real time ultrasound guidance: With a 22-gauge 2 inch needle patient was injected with 4 cc of 0.5% Marcaine and 1 cc of Kenalog 40 mg/dL. This was from a superior lateral approach.  Completed without difficulty  Pain immediately resolved suggesting accurate placement of the medication.  Advised to call if fevers/chills, erythema, induration, drainage, or persistent bleeding.  Images permanently stored and available for review in the ultrasound unit.  Impression: Technically successful ultrasound guided injection.      Impression and Recommendations:     This case required medical decision making of moderate complexity.

## 2013-09-07 NOTE — Assessment & Plan Note (Signed)
Patient does have arthritis as well as a small degenerative meniscal tear that I think is causing her pain. Patient did respond very well to the intra-articular steroid injection. We decided on injection secondary to her other comorbidities and medications likely be more harmful. Patient was given a brace was fitted by me as well. We discussed an icing regimen and home exercise program. Patient will discuss this with her cardio rehabilitation as well. We discussed which activities to avoid and which ones that could be beneficial. We discussed icing protocol. Patient and will try these interventions and come back again in 4 weeks for further evaluation. Patient continues to have difficulty we may need to consider Visco supplementation.

## 2013-09-08 ENCOUNTER — Ambulatory Visit: Payer: Self-pay | Admitting: Cardiovascular Disease

## 2013-09-08 ENCOUNTER — Ambulatory Visit (INDEPENDENT_AMBULATORY_CARE_PROVIDER_SITE_OTHER): Payer: Medicare Other | Admitting: Cardiovascular Disease

## 2013-09-08 ENCOUNTER — Encounter: Payer: Self-pay | Admitting: *Deleted

## 2013-09-08 ENCOUNTER — Encounter (HOSPITAL_COMMUNITY)
Admission: RE | Admit: 2013-09-08 | Discharge: 2013-09-08 | Disposition: A | Discharge status: Home / Self Care | Payer: Medicare Other | Source: Ambulatory Visit | Attending: Cardiovascular Disease | Admitting: Cardiovascular Disease

## 2013-09-08 VITALS — BP 140/80

## 2013-09-08 DIAGNOSIS — I10 Essential (primary) hypertension: Secondary | ICD-10-CM

## 2013-09-08 DIAGNOSIS — I6529 Occlusion and stenosis of unspecified carotid artery: Secondary | ICD-10-CM

## 2013-09-08 DIAGNOSIS — I480 Paroxysmal atrial fibrillation: Secondary | ICD-10-CM

## 2013-09-08 DIAGNOSIS — I4891 Unspecified atrial fibrillation: Secondary | ICD-10-CM | POA: Diagnosis not present

## 2013-09-08 DIAGNOSIS — Z5189 Encounter for other specified aftercare: Secondary | ICD-10-CM | POA: Diagnosis not present

## 2013-09-08 DIAGNOSIS — I2510 Atherosclerotic heart disease of native coronary artery without angina pectoris: Secondary | ICD-10-CM | POA: Diagnosis not present

## 2013-09-08 NOTE — Assessment & Plan Note (Signed)
Well controlled.  Continue current medications and low sodium Dash type diet.    

## 2013-09-08 NOTE — Assessment & Plan Note (Signed)
60-79% RICA stenosis.  F/U carotid duplex in 6 months November [facility]SA

## 2013-09-08 NOTE — Progress Notes (Signed)
11:05 -Pt returned to rehab walking with a rolator walking device and a knee brace to her left leg.  Pt a bit flustered due to some car issues while in the garage where her car stopped and would not move.  Pt noted that her car was in neutral with the bright head beams on.  Pt seen in office by Dr. Tamala Julian on Tuesday.  Called dr. Tamala Julian office and talked with his CMA Mendel Ryder for ok to exercise and any restrictions. Advised by Mendel Ryder ok for pt to exercise but not to do any deep squats.  Pt will use the arm crank 10 minutes and stepper for 20 minutes. Pt tolerated exercise with no complaints.  Pt given a copy of rehab report for pt to take with her to Dr. Kyla Balzarine office.  Pt has an appt later today at 1:15.

## 2013-09-08 NOTE — Assessment & Plan Note (Signed)
Stable with no angina and good activity level.  Continue medical Rx  

## 2013-09-08 NOTE — Patient Instructions (Signed)
Your physician recommends that you continue on your current medications as directed. Please refer to the Current Medication list given to you today.  Your physician has requested that you have a carotid duplex in November of 2015. This test is an ultrasound of the carotid arteries in your neck. It looks at blood flow through these arteries that supply the brain with blood. Allow one hour for this exam. There are no restrictions or special instructions.  Your physician wants you to follow-up in: 6 months with Dr. [name]. You will receive a reminder letter in the mail two months in advance. If you don't receive a letter, please call our office to schedule the follow-up appointment.

## 2013-09-08 NOTE — Progress Notes (Signed)
Patient ID: [name] [name], female   DOB: [birth year], 80 y.o.   MRN: [id number] Loyd returns today for F/U of HTN, palpitations and fatigue. She was hospitalized 2014 for a diverticular bleed. This seems stable and she has F/U with Dr. Earlean Shawl. She has had palpitations. Event monitor  Benign  with no significant arrythmias 2014 . She has been compliant with her BP meds. She has had a previous RCEA by Dr. Amedeo Plenty. 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 She denies SSCP, palpitations, dyspnea, PND orthopnea and edema   She is on a study at [facility] for BP And gets it followed every 3 months   Hospitalized at [facility] for syncope in March Subsequently had chest pain and cath done Had CABG  3/15 with Dr Clementeen Graham  SVG d RCA, SVG OM1, distal circumflex and LIMA LAD  Had brief periop afib Rx with amidarone and left hospital in [place]  Injured left knee and ankle getting cortisone shots    ROS: Denies fever, malais, weight loss, blurry vision, decreased visual acuity, cough, sputum, SOB, hemoptysis, pleuritic pain, palpitaitons, heartburn, abdominal pain, melena, lower extremity edema, claudication, or rash.  All other systems reviewed and negative  General: Affect appropriate Pale elderly female  HEENT: normal Neck supple with no adenopathy JVP norma lright bruits no thyromegaly Lungs clear with no wheezing and good diaphragmatic motion Heart:  S1/S2 no murmur, no rub, gallop or click PMI normal Abdomen: benighn, BS positve, no tenderness, no AAA no bruit.  No HSM or HJR Distal pulses intact with no bruits No edema Neuro non-focal Skin warm and dry No muscular weakness Brace on left knee    Current Outpatient Prescriptions  Medication Sig Dispense Refill  . acetaminophen (TYLENOL) 650 MG CR tablet Take 650 mg by mouth every 8 (eight) hours as needed for pain.      [name]  albuterol (PROVENTIL) (5 MG/ML) 0.5% nebulizer solution Take 2.5 mg by nebulization every 6 (six) hours as needed for wheezing or shortness of breath.      [name] aspirin 325 MG EC tablet Take 325 mg by mouth daily.      [name] atorvastatin (LIPITOR) 40 MG tablet Take 1 tablet (40 mg total) by mouth daily.  30 tablet  11  . Cholecalciferol (EQL VITAMIN D3) 1000 UNITS tablet Take 1 tablet (1,000 Units total) by mouth daily.  100 tablet  3  . darbepoetin (ARANESP, ALB FREE, SURECLICK) [id number] [id number] SOLN Inject 100 mcg into the skin every 8 (eight) weeks.       . furosemide (LASIX) 20 MG tablet Take 1 tablet (20 mg total) by mouth daily.  30 tablet  11  . levothyroxine (SYNTHROID, LEVOTHROID) 50 MCG tablet Take 1 tablet (50 mcg total) by mouth daily before breakfast.  90 tablet  3  . LORazepam (ATIVAN) 0.5 MG tablet Take 1 tablet (0.5 mg total) by mouth every 6 (six) hours as needed for anxiety.  30 tablet  1  . metoprolol tartrate (LOPRESSOR) 25 MG tablet Take 0.5 tablets (12.5 mg total) by mouth 2 (two) times daily.  30 tablet  11  . mirtazapine (REMERON) 15 MG tablet Take 1 tablet (15 mg total) by mouth at bedtime.  90 tablet  1  . omeprazole (PRILOSEC) 20 MG capsule Take 1 capsule (20 mg total) by mouth  daily.  90 capsule  3  . ondansetron (ZOFRAN) 4 MG tablet Take 4 mg by mouth every 4 (four) hours as needed for nausea or vomiting.      . predniSONE (DELTASONE) 10 MG tablet Prednisone 10 mg: take 4 tabs a day x 3 days; then 3 tabs a day x 4 days; then 2 tabs a day x 4 days, then 1 tab a day x 6 days, then stop. Take pc.  38 tablet  1  . sennosides-docusate sodium (SENOKOT-S) 8.6-50 MG tablet Take 2 tablets by mouth daily.      . vitamin B-12 (CYANOCOBALAMIN) 1000 MCG tablet Take 1 tablet (1,000 mcg total) by mouth daily.  100 tablet  3   No current facility-administered medications for this visit.    Allergies  Codeine sulfate; Darvon; Propoxyphene napsylate; Sulfacetamide sodium-sulfur;  Sulfamethoxazole-trimethoprim; and Zolpidem  Electrocardiogram:  SR rate 75 nonspecific ST changes incorrectly read by computer as accelerated junctional  Today SR rate 73 normal   Assessment and Plan

## 2013-09-10 ENCOUNTER — Encounter (HOSPITAL_COMMUNITY)
Admission: RE | Admit: 2013-09-10 | Discharge: 2013-09-10 | Disposition: A | Discharge status: Home / Self Care | Payer: Medicare Other | Source: Ambulatory Visit | Attending: Cardiovascular Disease | Admitting: Cardiovascular Disease

## 2013-09-10 DIAGNOSIS — Z5189 Encounter for other specified aftercare: Secondary | ICD-10-CM | POA: Diagnosis not present

## 2013-09-13 ENCOUNTER — Encounter (HOSPITAL_COMMUNITY)
Admission: RE | Admit: 2013-09-13 | Discharge: 2013-09-13 | Disposition: A | Discharge status: Home / Self Care | Payer: Medicare Other | Source: Ambulatory Visit | Attending: Cardiovascular Disease | Admitting: Cardiovascular Disease

## 2013-09-13 DIAGNOSIS — Z5189 Encounter for other specified aftercare: Secondary | ICD-10-CM | POA: Diagnosis not present

## 2013-09-15 ENCOUNTER — Encounter (HOSPITAL_COMMUNITY)
Admission: RE | Admit: 2013-09-15 | Discharge: 2013-09-15 | Disposition: A | Discharge status: Home / Self Care | Payer: Medicare Other | Source: Ambulatory Visit | Attending: Cardiovascular Disease | Admitting: Cardiovascular Disease

## 2013-09-15 DIAGNOSIS — Z5189 Encounter for other specified aftercare: Secondary | ICD-10-CM | POA: Diagnosis not present

## 2013-09-17 ENCOUNTER — Encounter (HOSPITAL_COMMUNITY)
Admission: RE | Admit: 2013-09-17 | Discharge: 2013-09-17 | Disposition: A | Discharge status: Home / Self Care | Payer: Medicare Other | Source: Ambulatory Visit | Attending: Cardiovascular Disease | Admitting: Cardiovascular Disease

## 2013-09-17 DIAGNOSIS — Z5189 Encounter for other specified aftercare: Secondary | ICD-10-CM | POA: Diagnosis not present

## 2013-09-20 ENCOUNTER — Ambulatory Visit (HOSPITAL_BASED_OUTPATIENT_CLINIC_OR_DEPARTMENT_OTHER): Payer: Medicare Other

## 2013-09-20 ENCOUNTER — Encounter (HOSPITAL_COMMUNITY)
Admission: RE | Admit: 2013-09-20 | Discharge: 2013-09-20 | Disposition: A | Discharge status: Home / Self Care | Payer: Medicare Other | Source: Ambulatory Visit | Attending: Cardiovascular Disease | Admitting: Cardiovascular Disease

## 2013-09-20 ENCOUNTER — Other Ambulatory Visit (HOSPITAL_BASED_OUTPATIENT_CLINIC_OR_DEPARTMENT_OTHER): Payer: Medicare Other

## 2013-09-20 VITALS — BP 148/70 | HR 74 | Temp 98.4°F

## 2013-09-20 DIAGNOSIS — N039 Chronic nephritic syndrome with unspecified morphologic changes: Secondary | ICD-10-CM

## 2013-09-20 DIAGNOSIS — N189 Chronic kidney disease, unspecified: Secondary | ICD-10-CM

## 2013-09-20 DIAGNOSIS — Z5189 Encounter for other specified aftercare: Secondary | ICD-10-CM | POA: Diagnosis not present

## 2013-09-20 DIAGNOSIS — D631 Anemia in chronic kidney disease: Secondary | ICD-10-CM

## 2013-09-20 LAB — CBC WITH DIFFERENTIAL/PLATELET
BASO%: 0.5 % (ref 0.0–2.0)
Basophils Absolute: 0 10*3/uL (ref 0.0–0.1)
EOS%: 3.4 % (ref 0.0–7.0)
Eosinophils Absolute: 0.2 10*3/uL (ref 0.0–0.5)
HCT: 31.5 % — ABNORMAL LOW (ref 34.8–46.6)
HGB: 10.3 g/dL — ABNORMAL LOW (ref 11.6–15.9)
LYMPH%: 18.2 % (ref 14.0–49.7)
MCH: 29.5 pg (ref 25.1–34.0)
MCHC: 32.7 g/dL (ref 31.5–36.0)
MCV: 90.3 fL (ref 79.5–101.0)
MONO#: 0.4 10*3/uL (ref 0.1–0.9)
MONO%: 6.4 % (ref 0.0–14.0)
NEUT#: 4.5 10*3/uL (ref 1.5–6.5)
NEUT%: 71.5 % (ref 38.4–76.8)
Platelets: 308 10*3/uL (ref 145–400)
RBC: 3.49 10*6/uL — ABNORMAL LOW (ref 3.70–5.45)
RDW: 15.4 % — ABNORMAL HIGH (ref 11.2–14.5)
WBC: 6.3 10*3/uL (ref 3.9–10.3)
lymph#: 1.1 10*3/uL (ref 0.9–3.3)

## 2013-09-20 MED ORDER — DARBEPOETIN ALFA-POLYSORBATE 200 MCG/0.4ML IJ SOLN
200.0000 ug | Freq: Once | INTRAMUSCULAR | Status: AC
  Administered 2013-09-20: 200 ug via SUBCUTANEOUS
  Filled 2013-09-20: qty 0.4

## 2013-09-22 ENCOUNTER — Encounter (HOSPITAL_COMMUNITY)
Admission: RE | Admit: 2013-09-22 | Discharge: 2013-09-22 | Disposition: A | Discharge status: Home / Self Care | Payer: Medicare Other | Source: Ambulatory Visit | Attending: Cardiovascular Disease | Admitting: Cardiovascular Disease

## 2013-09-22 DIAGNOSIS — I4891 Unspecified atrial fibrillation: Secondary | ICD-10-CM | POA: Diagnosis not present

## 2013-09-22 DIAGNOSIS — I2510 Atherosclerotic heart disease of native coronary artery without angina pectoris: Secondary | ICD-10-CM | POA: Diagnosis not present

## 2013-09-22 DIAGNOSIS — I509 Heart failure, unspecified: Secondary | ICD-10-CM | POA: Diagnosis not present

## 2013-09-22 DIAGNOSIS — I739 Peripheral vascular disease, unspecified: Secondary | ICD-10-CM | POA: Insufficient documentation

## 2013-09-22 DIAGNOSIS — I214 Non-ST elevation (NSTEMI) myocardial infarction: Secondary | ICD-10-CM | POA: Insufficient documentation

## 2013-09-22 DIAGNOSIS — Z951 Presence of aortocoronary bypass graft: Secondary | ICD-10-CM | POA: Insufficient documentation

## 2013-09-22 DIAGNOSIS — Z5189 Encounter for other specified aftercare: Secondary | ICD-10-CM | POA: Insufficient documentation

## 2013-09-22 DIAGNOSIS — I6529 Occlusion and stenosis of unspecified carotid artery: Secondary | ICD-10-CM | POA: Diagnosis not present

## 2013-09-24 ENCOUNTER — Encounter (HOSPITAL_COMMUNITY)
Admission: RE | Admit: 2013-09-24 | Discharge: 2013-09-24 | Disposition: A | Discharge status: Home / Self Care | Payer: Medicare Other | Source: Ambulatory Visit | Attending: Cardiovascular Disease | Admitting: Cardiovascular Disease

## 2013-09-24 DIAGNOSIS — Z5189 Encounter for other specified aftercare: Secondary | ICD-10-CM | POA: Diagnosis not present

## 2013-09-29 ENCOUNTER — Encounter (HOSPITAL_COMMUNITY): Payer: Medicare Other

## 2013-10-01 ENCOUNTER — Other Ambulatory Visit: Payer: Medicare Other

## 2013-10-01 ENCOUNTER — Encounter: Payer: Self-pay | Admitting: Internal Medicine

## 2013-10-01 ENCOUNTER — Encounter (HOSPITAL_COMMUNITY)
Admission: RE | Admit: 2013-10-01 | Discharge: 2013-10-01 | Disposition: A | Discharge status: Home / Self Care | Payer: Medicare Other | Source: Ambulatory Visit | Attending: Cardiovascular Disease | Admitting: Cardiovascular Disease

## 2013-10-01 ENCOUNTER — Ambulatory Visit (INDEPENDENT_AMBULATORY_CARE_PROVIDER_SITE_OTHER): Payer: Medicare Other | Admitting: Internal Medicine

## 2013-10-01 ENCOUNTER — Other Ambulatory Visit (INDEPENDENT_AMBULATORY_CARE_PROVIDER_SITE_OTHER): Payer: Medicare Other

## 2013-10-01 VITALS — BP 132/72 | HR 85 | Temp 97.5°F | Wt 167.1 lb

## 2013-10-01 DIAGNOSIS — N189 Chronic kidney disease, unspecified: Secondary | ICD-10-CM

## 2013-10-01 DIAGNOSIS — N039 Chronic nephritic syndrome with unspecified morphologic changes: Secondary | ICD-10-CM

## 2013-10-01 DIAGNOSIS — I2510 Atherosclerotic heart disease of native coronary artery without angina pectoris: Secondary | ICD-10-CM | POA: Diagnosis not present

## 2013-10-01 DIAGNOSIS — L659 Nonscarring hair loss, unspecified: Secondary | ICD-10-CM | POA: Insufficient documentation

## 2013-10-01 DIAGNOSIS — Z23 Encounter for immunization: Secondary | ICD-10-CM | POA: Diagnosis not present

## 2013-10-01 DIAGNOSIS — E559 Vitamin D deficiency, unspecified: Secondary | ICD-10-CM | POA: Diagnosis not present

## 2013-10-01 DIAGNOSIS — J45909 Unspecified asthma, uncomplicated: Secondary | ICD-10-CM

## 2013-10-01 DIAGNOSIS — D631 Anemia in chronic kidney disease: Secondary | ICD-10-CM

## 2013-10-01 DIAGNOSIS — R209 Unspecified disturbances of skin sensation: Secondary | ICD-10-CM

## 2013-10-01 DIAGNOSIS — R202 Paresthesia of skin: Secondary | ICD-10-CM

## 2013-10-01 DIAGNOSIS — E039 Hypothyroidism, unspecified: Secondary | ICD-10-CM

## 2013-10-01 DIAGNOSIS — N259 Disorder resulting from impaired renal tubular function, unspecified: Secondary | ICD-10-CM

## 2013-10-01 DIAGNOSIS — Z5189 Encounter for other specified aftercare: Secondary | ICD-10-CM | POA: Diagnosis not present

## 2013-10-01 LAB — HEPATIC FUNCTION PANEL
ALBUMIN: 3.3 g/dL — AB (ref 3.5–5.2)
ALT: 12 U/L (ref 0–35)
AST: 24 U/L (ref 0–37)
Alkaline Phosphatase: 122 U/L — ABNORMAL HIGH (ref 39–117)
Bilirubin, Direct: 0.1 mg/dL (ref 0.0–0.3)
Total Bilirubin: 0.6 mg/dL (ref 0.2–1.2)
Total Protein: 7.1 g/dL (ref 6.0–8.3)

## 2013-10-01 LAB — IBC PANEL
Iron: 65 ug/dL (ref 42–145)
Saturation Ratios: 23.2 % (ref 20.0–50.0)
Transferrin: 200.3 mg/dL — ABNORMAL LOW (ref 212.0–360.0)

## 2013-10-01 LAB — BASIC METABOLIC PANEL
BUN: 38 mg/dL — ABNORMAL HIGH (ref 6–23)
CHLORIDE: 102 meq/L (ref 96–112)
CO2: 28 meq/L (ref 19–32)
Calcium: 9.2 mg/dL (ref 8.4–10.5)
Creatinine, Ser: 2.2 mg/dL — ABNORMAL HIGH (ref 0.4–1.2)
GFR: 23.16 mL/min — ABNORMAL LOW (ref >60.00)
Glucose, Bld: 96 mg/dL (ref 70–99)
POTASSIUM: 3.6 meq/L (ref 3.5–5.1)
SODIUM: 140 meq/L (ref 135–145)

## 2013-10-01 LAB — VITAMIN D 25 HYDROXY (VIT D DEFICIENCY, FRACTURES): VITD: 45.3 ng/mL (ref 30.00–100.00)

## 2013-10-01 LAB — SEDIMENTATION RATE: Sed Rate: 35 mm/hr — ABNORMAL HIGH (ref 0–22)

## 2013-10-01 LAB — T4, FREE: FREE T4: 1.12 ng/dL (ref 0.60–1.60)

## 2013-10-01 LAB — TSH: TSH: 5.21 u[IU]/mL — ABNORMAL HIGH (ref 0.35–4.50)

## 2013-10-01 LAB — VITAMIN B12: Vitamin B-12: 1417 pg/mL — ABNORMAL HIGH (ref 211–911)

## 2013-10-01 NOTE — Assessment & Plan Note (Signed)
Continue with current prescription therapy as reflected on the Med list. Labs  

## 2013-10-01 NOTE — Progress Notes (Signed)
Pre visit review using our clinic review tool, if applicable. No additional management support is needed unless otherwise documented below in the visit note. 

## 2013-10-01 NOTE — Progress Notes (Signed)
Subjective:     HPI  C/o hair loss x 1 month F/u L leg would give way- weak L knee   F/u pain in R thigh - better  Had an MI/CABG in Feb 2015 Hospitalized at [facility] for syncope in March Subsequently had chest pain and cath done Had CABG with Dr Clementeen Graham  SVG d RCA, SVG OM1, distal circumflex and LIMA LAD  Had brief periop afib Rx with amidarone and left hospital in [place] Back home from [place] 2 wks ago. In PT at home now 5/15). The patient presents for a follow-up of  chronic hypertension, chronic dyslipidemia, anemia, CRI, vit D def  Wt Readings from Last 3 Encounters:  10/01/13 167 lb 2 oz (75.807 kg)  09/07/13 164 lb (74.39 kg)  09/01/13 165 lb (74.844 kg)   BP Readings from Last 3 Encounters:  10/01/13 132/72  09/20/13 148/70  09/08/13 140/80     Review of Systems  Constitutional: Negative for chills, activity change, appetite change, fatigue and unexpected weight change.  HENT: Negative for congestion, mouth sores and sinus pressure.   Eyes: Negative for visual disturbance.  Respiratory: Negative for cough and chest tightness.   Cardiovascular: Positive for leg swelling.  Gastrointestinal: Negative for nausea and abdominal pain.  Genitourinary: Negative for frequency, difficulty urinating and vaginal pain.  Musculoskeletal: Negative for back pain and gait problem.       Cramps  Skin: Negative for pallor and rash.  Neurological: Negative for dizziness, tremors, weakness and headaches.  Psychiatric/Behavioral: Positive for sleep disturbance. Negative for suicidal ideas and confusion. The patient is nervous/anxious.        Objective:   Physical Exam  Constitutional: She is oriented to person, place, and time. She appears well-developed. No distress.  Obese   HENT:  Head: Normocephalic.  Right Ear: External ear normal.  Left Ear: External ear normal.  Nose: Nose normal.  Mouth/Throat: Oropharynx is clear and moist.  Eyes: Conjunctivae are normal. Pupils  are equal, round, and reactive to light. Right eye exhibits no discharge. Left eye exhibits no discharge.  Neck: Normal range of motion. Neck supple. No JVD present. No tracheal deviation present. No thyromegaly present.  Cardiovascular: Normal rate, regular rhythm and normal heart sounds.   Pulmonary/Chest: No stridor. No respiratory distress. She has no wheezes.  Abdominal: Soft. Bowel sounds are normal. She exhibits no distension and no mass. There is no tenderness. There is no rebound and no guarding.  Musculoskeletal: She exhibits tenderness (LS spine, hips). She exhibits no edema.  Lymphadenopathy:    She has no cervical adenopathy.  Neurological: She is alert and oriented to person, place, and time. She displays normal reflexes. No cranial nerve deficit. She exhibits normal muscle tone. Coordination normal.  Skin: No rash noted. No erythema.  Psychiatric: She has a normal mood and affect. Her behavior is normal. Judgment and thought content normal.  Walker L knee is in a brace L hip w/nl ROM Knee extensors on L knee 5-/5 L ankle w/lat swelling - better   Lab Results  Component Value Date   WBC 6.3 09/20/2013   HGB 10.3* 09/20/2013   HCT 31.5* 09/20/2013   PLT 308 09/20/2013   GLUCOSE 102 05/31/2013   CHOL 92 04/15/2013   TRIG 62 04/15/2013   HDL 40 04/15/2013   LDLCALC 30 04/15/2013   ALT 11 05/31/2013   AST 21 05/31/2013   NA 142 05/31/2013   K 3.5 05/31/2013   CL 104 10/01/2012  CREATININE 2.3* 05/31/2013   BUN 38.2* 05/31/2013   CO2 25 05/31/2013   TSH 14.83* 04/15/2013   INR 1.1 09/08/2008        Assessment & Plan:

## 2013-10-01 NOTE — Assessment & Plan Note (Signed)
Continue with current prescription therapy as reflected on the Med list.  

## 2013-10-01 NOTE — Patient Instructions (Signed)
Take Biotin and Vitamin B complex 1 a day each for your hair

## 2013-10-01 NOTE — Assessment & Plan Note (Signed)
9/15 new x1 mo ?multifactorial  Labs Biotin B complex

## 2013-10-04 ENCOUNTER — Encounter (HOSPITAL_COMMUNITY)
Admission: RE | Admit: 2013-10-04 | Discharge: 2013-10-04 | Disposition: A | Discharge status: Home / Self Care | Payer: Medicare Other | Source: Ambulatory Visit | Attending: Cardiovascular Disease | Admitting: Cardiovascular Disease

## 2013-10-04 DIAGNOSIS — Z5189 Encounter for other specified aftercare: Secondary | ICD-10-CM | POA: Diagnosis not present

## 2013-10-04 LAB — ANA: Anti Nuclear Antibody(ANA): NEGATIVE

## 2013-10-05 ENCOUNTER — Encounter: Payer: Self-pay | Admitting: Family Medicine

## 2013-10-05 ENCOUNTER — Ambulatory Visit (INDEPENDENT_AMBULATORY_CARE_PROVIDER_SITE_OTHER): Payer: Medicare Other | Admitting: Family Medicine

## 2013-10-05 VITALS — BP 130/76 | HR 81 | Ht 63.0 in | Wt 169.0 lb

## 2013-10-05 DIAGNOSIS — M7050 Other bursitis of knee, unspecified knee: Secondary | ICD-10-CM

## 2013-10-05 DIAGNOSIS — I2510 Atherosclerotic heart disease of native coronary artery without angina pectoris: Secondary | ICD-10-CM | POA: Diagnosis not present

## 2013-10-05 DIAGNOSIS — IMO0002 Reserved for concepts with insufficient information to code with codable children: Secondary | ICD-10-CM

## 2013-10-05 DIAGNOSIS — M1712 Unilateral primary osteoarthritis, left knee: Secondary | ICD-10-CM

## 2013-10-05 DIAGNOSIS — M1710 Unilateral primary osteoarthritis, unspecified knee: Secondary | ICD-10-CM

## 2013-10-05 NOTE — Assessment & Plan Note (Signed)
Patient does have some pes anserine bursa is inflamed today. Patient was given an injection that day and didn't tolerated very well with good resolution of pain. Patient was given hamstring exercises that I think will be beneficial. Encourage her to start doing more activity. Patient will continue to wear the brace as long as it is helpful. Patient will come back and see me again in 3-4 weeks.

## 2013-10-05 NOTE — Patient Instructions (Signed)
ood to see you Alvera Singh is your friend Start walking more at rehab Alternate daily with new exercises Come back in 3 weeks and if not perfect will start orthovisc.

## 2013-10-05 NOTE — Progress Notes (Signed)
[name] [address] [place], [place] [address] Phone: [phone] Subjective:    I'm seeing this patient by the request  of:  [name], MD   CC: Left leg pain follow up  [id number] [name] [name] is a 80 y.o. female coming in with complaint of left leg pain. Patient was seen by me 3 weeks ago. Patient was given an injection in her left knee for degenerative meniscal tear as well as moderate arthritic changes. Patient states that the injection she has been doing better but continues to have pain on the medial aspect of the knee. Has been wearing the brace. Patient is to make to start more walking at physical therapy the patient is concerned that this will cause more pain in the knee. Patient states that she notices it more when she does more activity. States it is still a dull throbbing pain. States that he can wake her up at night but this is very seldom. When she really thinks about it it is somewhat better but only about 50% better.    Past medical history, social, surgical and family history all reviewed in electronic medical record.   Review of Systems: No headache, visual changes, nausea, vomiting, diarrhea, constipation, dizziness, abdominal pain, skin rash, fevers, chills, night sweats, weight loss, swollen lymph nodes, body aches, joint swelling, muscle aches, chest pain, shortness of breath, mood changes.   Objective Blood pressure 130/76, pulse 81, height 5\' 3"  (1.6 m), weight 169 lb (76.658 kg), SpO2 95.00%.  General: No apparent distress alert and oriented x3 mood and affect normal, dressed appropriately.  HEENT: Pupils equal, extraocular movements intact  Respiratory: Patient's speak in full sentences and does not appear short of breath  Cardiovascular: No lower extremity edema, non tender, no erythema  Skin: Warm dry intact with no signs of infection or rash on extremities or on axial skeleton.  Abdomen: Soft nontender  Neuro:  Cranial nerves II through XII are intact, neurovascularly intact in all extremities with 2+ DTRs and 2+ pulses.  Lymph: No lymphadenopathy of posterior or anterior cervical chain or axillae bilaterally.  Gait ambulates with kyphosis and a walker MSK:  mild tender with near full range of motion and good stability and symmetric strength and tone of shoulders, elbows, wrist, hips bilaterally. Multiple osteoarthritic changes in multiple joints. Knee: Left Normal to inspection with no erythema or effusion or obvious bony abnormalities. Severely tender to palpation over the medial joint line as well as the pes anserine. Lax last 5 of extension and less 5 of flexion Ligaments with solid consistent endpoints including ACL, PCL, LCL, MCL. Positive Mcmurray's, Apley's, and Thessalonian tests. This note is tender to palpation more over the pes anserine bursa today. Non painful patellar compression. Patellar glide with minimal crepitus. Patellar and quadriceps tendons unremarkable. Hamstring and quadriceps strength is normal.   contralateral knee has some minimal tenderness to palpation as well.    Procedure: Real-time Ultrasound Guided Injection of left pes bursa Device: GE Logiq E  Ultrasound guided injection is preferred based studies that show increased duration, increased effect, greater accuracy, decreased procedural pain, increased response rate, and decreased cost with ultrasound guided versus blind injection.  Verbal informed consent obtained.  Time-out conducted.  Noted no overlying erythema, induration, or other signs of local infection.  Skin prepped in a sterile fashion.  Local anesthesia: Topical Ethyl chloride.  With sterile technique and under real time ultrasound guidance: With a 25-gauge 1 inch needle patient  was injected with 1 cc of 0.5% Marcaine and 1 cc of Kenalog 40 mg/dL. This was from a superior lateral approach.  Completed without difficulty  Pain immediately resolved  suggesting accurate placement of the medication.  Advised to call if fevers/chills, erythema, induration, drainage, or persistent bleeding.  Images permanently stored and available for review in the ultrasound unit.  Impression: Technically successful ultrasound guided injection.      Impression and Recommendations:     This case required medical decision making of moderate complexity.

## 2013-10-05 NOTE — Assessment & Plan Note (Signed)
Patient does have chronic degenerative changes of his knee as well as an underlying degenerative meniscal tear. Patient will continue with the bracing, icing, home exercises and will start trying to increase her activity with formal physical therapy. The patient is to make any significant improvement I would consider patient has failed all conservative therapy he may be a candidate for viscous supplementation. We will attempt to get prior authorization now. Patient come back again in 3 weeks and we will start this if necessary.  Spent greater than 25 minutes with patient face-to-face and had greater than 50% of counseling including as described above in assessment and plan.

## 2013-10-06 ENCOUNTER — Encounter (HOSPITAL_COMMUNITY)
Admission: RE | Admit: 2013-10-06 | Discharge: 2013-10-06 | Disposition: A | Discharge status: Home / Self Care | Payer: Medicare Other | Source: Ambulatory Visit | Attending: Cardiovascular Disease | Admitting: Cardiovascular Disease

## 2013-10-06 DIAGNOSIS — Z5189 Encounter for other specified aftercare: Secondary | ICD-10-CM | POA: Diagnosis not present

## 2013-10-06 NOTE — Progress Notes (Signed)
Pt with elevation in bp and weight today.  Pt received cortisone injection to her left knee on yesterday.  Pt advised not to use weights and avoid deep knee bends.  [name] [name], BSN

## 2013-10-08 ENCOUNTER — Encounter (HOSPITAL_COMMUNITY)
Admission: RE | Admit: 2013-10-08 | Discharge: 2013-10-08 | Disposition: A | Discharge status: Home / Self Care | Payer: Medicare Other | Source: Ambulatory Visit | Attending: Cardiovascular Disease | Admitting: Cardiovascular Disease

## 2013-10-08 DIAGNOSIS — Z5189 Encounter for other specified aftercare: Secondary | ICD-10-CM | POA: Diagnosis not present

## 2013-10-11 ENCOUNTER — Encounter (HOSPITAL_COMMUNITY)
Admission: RE | Admit: 2013-10-11 | Discharge: 2013-10-11 | Disposition: A | Discharge status: Home / Self Care | Payer: Medicare Other | Source: Ambulatory Visit | Attending: Cardiovascular Disease | Admitting: Cardiovascular Disease

## 2013-10-11 DIAGNOSIS — Z5189 Encounter for other specified aftercare: Secondary | ICD-10-CM | POA: Diagnosis not present

## 2013-10-13 ENCOUNTER — Telehealth: Payer: Self-pay | Admitting: Internal Medicine

## 2013-10-13 ENCOUNTER — Encounter (HOSPITAL_COMMUNITY)
Admission: RE | Admit: 2013-10-13 | Discharge: 2013-10-13 | Disposition: A | Discharge status: Home / Self Care | Payer: Medicare Other | Source: Ambulatory Visit | Attending: Cardiovascular Disease | Admitting: Cardiovascular Disease

## 2013-10-13 DIAGNOSIS — Z5189 Encounter for other specified aftercare: Secondary | ICD-10-CM | POA: Diagnosis not present

## 2013-10-13 MED ORDER — LORAZEPAM 0.5 MG PO TABS: 0.5000 mg | ORAL_TABLET | Freq: Four times a day (QID) | ORAL | Status: DC | PRN

## 2013-10-13 NOTE — Telephone Encounter (Signed)
Pt called in and is looking for a refill on her LORazepam (ATIVAN) 0.5 MG tablet [id number].    Best number [phone] [facility] at [address]  Thank you!!

## 2013-10-13 NOTE — Telephone Encounter (Signed)
OK to fill this prescription with additional refills x2 Thank you!  

## 2013-10-14 NOTE — Telephone Encounter (Signed)
Notified pt md approved refill has been called into [facility]...[name] [name]

## 2013-10-15 ENCOUNTER — Encounter (HOSPITAL_COMMUNITY)
Admission: RE | Admit: 2013-10-15 | Discharge: 2013-10-15 | Disposition: A | Discharge status: Home / Self Care | Payer: Medicare Other | Source: Ambulatory Visit | Attending: Cardiovascular Disease | Admitting: Cardiovascular Disease

## 2013-10-15 DIAGNOSIS — Z5189 Encounter for other specified aftercare: Secondary | ICD-10-CM | POA: Diagnosis not present

## 2013-10-18 ENCOUNTER — Encounter (HOSPITAL_COMMUNITY)
Admission: RE | Admit: 2013-10-18 | Discharge: 2013-10-18 | Disposition: A | Discharge status: Home / Self Care | Payer: Medicare Other | Source: Ambulatory Visit | Attending: Cardiovascular Disease | Admitting: Cardiovascular Disease

## 2013-10-18 DIAGNOSIS — Z5189 Encounter for other specified aftercare: Secondary | ICD-10-CM | POA: Diagnosis not present

## 2013-10-18 NOTE — Progress Notes (Addendum)
Pt graduates today from cardiac rehab phase II program with the completion of 36 exercise sessions.  Pt has made  progress as evident by the increase MET level from 2.4 to 2.6.  This slight increase is a result of physical limitations due to left knee issue as a result of fall at home which impacted her ability to exercise and increase workloads.  Pt plans to continue exercise at home with walking.  Suggested participating in the maintenance program but unfortunately pt can not afford the 68.00 per month fee.  Encouraged pt to look at participating in the senior center.  Pt would greatly benefit from attending a social group setting. Pt has made some friendships during her participation which has help her cope with not returning back to work.  This has been very difficult adjustment for pt to cope with this "lost".  Pt will see Jeanella Craze, spiritual care, for consultation this week.  Pt will see dr. Alain Marion on 10/6 and pt plans to talk with him regarding ongoing consultation and possible medical therapy.  Repeat PHQ2 score 6.  Pt did not meet her goal to lose weight. Pt gained 3kg during her participation in rehab which may be closer to her pre surgery weight.  Pt's long tem goal is to increase strength.  Pt admits that she feels better but does not feel she is back to pre surgery status. Pt has two daughters but does not "burden" them with her problems.  Pt would benefit from continued medical supervision with periodic check in for well being.  It was a delight to work with the pt in the cardiac rehab program. Maurice Small RN, BSN

## 2013-10-20 ENCOUNTER — Telehealth (HOSPITAL_COMMUNITY): Payer: Self-pay | Admitting: *Deleted

## 2013-10-20 ENCOUNTER — Encounter (HOSPITAL_COMMUNITY): Payer: Medicare Other

## 2013-10-20 NOTE — Telephone Encounter (Signed)
Message copied by Rowe Pavy on Wed Oct 20, 2013  6:44 AM ------      Message from: Cassandria Anger      Created: Mon Oct 18, 2013  6:13 PM      Regarding: RE: appt on 10/6       Thank you!      AP      ----- Message -----         From: Rowe Pavy, RN         Sent: 10/18/2013   2:18 PM           To: Cassandria Anger, MD      Subject: appt on 10/6                                                   Dr. Alain Marion,       FYI:            This patient completed her participation in cardiac rehab today.  She will see you on 10/6.  We conduct a pre and post PHQ2  Assessment Pt scored 6 and feels she may have some depression as a result of not being able to work and physical limitations due to her knee. Asked about family support from her daughters - pt states that she does not   Pt will meet with Jeanella Craze in spiritual care here at the hospital this week to talk.  Pt may benefit from ongoing counseling and possible medical therapy moving forward.              Thanks so much we enjoyed working with Lenny Pastel RN             ------

## 2013-10-22 ENCOUNTER — Encounter (HOSPITAL_COMMUNITY): Payer: Medicare Other

## 2013-10-26 ENCOUNTER — Encounter: Payer: Self-pay | Admitting: Internal Medicine

## 2013-10-26 ENCOUNTER — Encounter: Payer: Self-pay | Admitting: Family Medicine

## 2013-10-26 ENCOUNTER — Ambulatory Visit (INDEPENDENT_AMBULATORY_CARE_PROVIDER_SITE_OTHER): Payer: Medicare Other | Admitting: Family Medicine

## 2013-10-26 ENCOUNTER — Ambulatory Visit (INDEPENDENT_AMBULATORY_CARE_PROVIDER_SITE_OTHER): Payer: Medicare Other | Admitting: Internal Medicine

## 2013-10-26 VITALS — BP 138/78 | HR 92 | Temp 98.2°F | Resp 16 | Ht 63.0 in | Wt 169.1 lb

## 2013-10-26 VITALS — BP 138/78 | HR 92 | Resp 16 | Ht 63.0 in | Wt 169.0 lb

## 2013-10-26 DIAGNOSIS — L659 Nonscarring hair loss, unspecified: Secondary | ICD-10-CM

## 2013-10-26 DIAGNOSIS — N259 Disorder resulting from impaired renal tubular function, unspecified: Secondary | ICD-10-CM

## 2013-10-26 DIAGNOSIS — I2510 Atherosclerotic heart disease of native coronary artery without angina pectoris: Secondary | ICD-10-CM | POA: Diagnosis not present

## 2013-10-26 DIAGNOSIS — M1712 Unilateral primary osteoarthritis, left knee: Secondary | ICD-10-CM

## 2013-10-26 DIAGNOSIS — I10 Essential (primary) hypertension: Secondary | ICD-10-CM | POA: Diagnosis not present

## 2013-10-26 DIAGNOSIS — E559 Vitamin D deficiency, unspecified: Secondary | ICD-10-CM | POA: Diagnosis not present

## 2013-10-26 DIAGNOSIS — F4321 Adjustment disorder with depressed mood: Secondary | ICD-10-CM

## 2013-10-26 DIAGNOSIS — J45909 Unspecified asthma, uncomplicated: Secondary | ICD-10-CM

## 2013-10-26 NOTE — Progress Notes (Deleted)
Pre visit review using our clinic review tool, if applicable. No additional management support is needed unless otherwise documented below in the visit note. 

## 2013-10-26 NOTE — Progress Notes (Signed)
  [name] [address] [place], [place] [address] Phone: [phone] Subjective:      CC: Left leg pain follow up  [id number] [name] [name] is a 80 y.o. female coming in with complaint of left leg pain. Patient was seen by me 3 weeks ago. Patient was given an injection in her left knee for degenerative meniscal tear as well as moderate arthritic changes. Patient continues with conservative therapy and states that overall she continues to do relatively well. Patient states that her knee is only a dull aching pain when she does a significant amount activity. Lungs patient wears her brace and continues with the over-the-counter medications he seems to be relatively well. Patient forgets to ice from time to time but if she does a more regular patient she notices improvement as well. Denies any fevers or chills or any abnormal weight loss. Overall continues to actually make improvement she feels. Patient denies any clicking or popping or giving out on her. Patient states that the pain is entering bursa that was inflamed last time it is completely resolved at this time. Patient is looking forward to start some exercises classes but has not at this point with her just finishing cardiac rehabilitation.  Past medical history, social, surgical and family history all reviewed in electronic medical record.   Review of Systems: No headache, visual changes, nausea, vomiting, diarrhea, constipation, dizziness, abdominal pain, skin rash, fevers, chills, night sweats, weight loss, swollen lymph nodes, body aches, joint swelling, muscle aches, chest pain, shortness of breath, mood changes.   Objective Blood pressure 138/78, pulse 92, resp. rate 16, height 5\' 3"  (1.6 m), weight 169 lb (76.658 kg), SpO2 96.00%.  General: No apparent distress alert and oriented x3 mood and affect normal, dressed appropriately.  HEENT: Pupils equal, extraocular movements intact  Respiratory:  Patient's speak in full sentences and does not appear short of breath  Cardiovascular: No lower extremity edema, non tender, no erythema  Skin: Warm dry intact with no signs of infection or rash on extremities or on axial skeleton.  Abdomen: Soft nontender  Neuro: Cranial nerves II through XII are intact, neurovascularly intact in all extremities with 2+ DTRs and 2+ pulses.  Lymph: No lymphadenopathy of posterior or anterior cervical chain or axillae bilaterally.  Gait ambulates with kyphosis and a walker MSK:  mild tender with near full range of motion and good stability and symmetric strength and tone of shoulders, elbows, wrist, hips bilaterally. Multiple osteoarthritic changes in multiple joints. Knee: Left Normal to inspection with no erythema or effusion or obvious bony abnormalities. Lax last 5 of extension and less 5 of flexion Ligaments with solid consistent endpoints including ACL, PCL, LCL, MCL. Positive Mcmurray's, Apley's, and Thessalonian tests. Mild tenderness over the medial joint line but continues to improve. Non painful patellar compression. Patellar glide with minimal crepitus. Patellar and quadriceps tendons unremarkable. Hamstring and quadriceps strength is normal.   contralateral knee has some minimal tenderness to palpation as well.      Impression and Recommendations:     This case required medical decision making of moderate complexity.

## 2013-10-26 NOTE — Assessment & Plan Note (Signed)
Continue with current prescription therapy as reflected on the Med list.  

## 2013-10-26 NOTE — Assessment & Plan Note (Signed)
Patient does have mild to moderate osteoarthritic changes but also does have a chronic meniscal tear. Patient is responding very well to conservative therapy at this time. We discussed continuing the icing regimen and wearing a brace when she is doing a significant amount activity. We discussed the importance of icing. We discussed the possibility of formal physical therapy the patient would like to and do more exercises classes. Patient will start some share yoga classes that I think will be helpful. We're going to continue all other conservative therapy and patient was given a prescription for topical anti-inflammatories a try. Patient will come back and see me again in 4 weeks for further evaluation. Patient would like to have an ultrasound to make sure that the meniscus is healing at that time.  Spent greater than 25 minutes with patient face-to-face and had greater than 50% of counseling including as described above in assessment and plan.

## 2013-10-26 NOTE — Assessment & Plan Note (Signed)
In counseling

## 2013-10-26 NOTE — Assessment & Plan Note (Signed)
?  better We are trying to normalize the TSH - RTC 2 mo

## 2013-10-26 NOTE — Assessment & Plan Note (Signed)
St 3 CRI

## 2013-10-26 NOTE — Progress Notes (Signed)
Subjective:     HPI  C/o hair loss x 2 month - better F/u L leg would give way- weak L knee - better   F/u pain in R thigh - better  Had an MI/CABG in Feb 2015 Hospitalized at [facility] for syncope in March Subsequently had chest pain and cath done Had CABG with Dr Clementeen Graham  SVG d RCA, SVG OM1, distal circumflex and LIMA LAD  Had brief periop afib Rx with amidarone and left hospital in [place] Back home from [place] 2 wks ago. In PT at home now 5/15). The patient presents for a follow-up of  chronic hypertension, chronic dyslipidemia, anemia, CRI, vit D def  Wt Readings from Last 3 Encounters:  10/26/13 169 lb 1.9 oz (76.712 kg)  10/05/13 169 lb (76.658 kg)  10/01/13 167 lb 2 oz (75.807 kg)   BP Readings from Last 3 Encounters:  10/26/13 138/78  10/05/13 130/76  10/01/13 132/72     Review of Systems  Constitutional: Negative for chills, activity change, appetite change, fatigue and unexpected weight change.  HENT: Negative for congestion, mouth sores and sinus pressure.   Eyes: Negative for visual disturbance.  Respiratory: Negative for cough and chest tightness.   Cardiovascular: Positive for leg swelling.  Gastrointestinal: Negative for nausea and abdominal pain.  Genitourinary: Negative for frequency, difficulty urinating and vaginal pain.  Musculoskeletal: Negative for back pain and gait problem.       Cramps  Skin: Negative for pallor and rash.  Neurological: Negative for dizziness, tremors, weakness and headaches.  Psychiatric/Behavioral: Positive for sleep disturbance. Negative for suicidal ideas and confusion. The patient is nervous/anxious.        Objective:   Physical Exam  Constitutional: She is oriented to person, place, and time. She appears well-developed. No distress.  Obese   HENT:  Head: Normocephalic.  Right Ear: External ear normal.  Left Ear: External ear normal.  Nose: Nose normal.  Mouth/Throat: Oropharynx is clear and moist.  Eyes:  Conjunctivae are normal. Pupils are equal, round, and reactive to light. Right eye exhibits no discharge. Left eye exhibits no discharge.  Neck: Normal range of motion. Neck supple. No JVD present. No tracheal deviation present. No thyromegaly present.  Cardiovascular: Normal rate, regular rhythm and normal heart sounds.   Pulmonary/Chest: No stridor. No respiratory distress. She has no wheezes.  Abdominal: Soft. Bowel sounds are normal. She exhibits no distension and no mass. There is no tenderness. There is no rebound and no guarding.  Musculoskeletal: She exhibits tenderness (LS spine, hips). She exhibits no edema.  Lymphadenopathy:    She has no cervical adenopathy.  Neurological: She is alert and oriented to person, place, and time. She displays normal reflexes. No cranial nerve deficit. She exhibits normal muscle tone. Coordination normal.  Skin: No rash noted. No erythema.  Psychiatric: She has a normal mood and affect. Her behavior is normal. Judgment and thought content normal.  Walker L knee is in a brace - better L hip w/nl ROM Knee extensors on L knee 5-/5 L ankle - better   Lab Results  Component Value Date   WBC 6.3 09/20/2013   HGB 10.3* 09/20/2013   HCT 31.5* 09/20/2013   PLT 308 09/20/2013   GLUCOSE 96 10/01/2013   CHOL 92 04/15/2013   TRIG 62 04/15/2013   HDL 40 04/15/2013   LDLCALC 30 04/15/2013   ALT 12 10/01/2013   AST 24 10/01/2013   NA 140 10/01/2013   K 3.6 10/01/2013  CL 102 10/01/2013   CREATININE 2.2* 10/01/2013   BUN 38* 10/01/2013   CO2 28 10/01/2013   TSH 5.21* 10/01/2013   INR 1.1 09/08/2008        Assessment & Plan:

## 2013-10-26 NOTE — Patient Instructions (Signed)
You are dong great.  Ice is your friend Continue the brace but try to not wear it when doing little activity such as around the house.  Try the pennsaid at night. If you like it then we can get you a prescription.  Continue the vitamin D  See me 4-6 weeks.

## 2013-10-31 ENCOUNTER — Encounter: Payer: Self-pay | Admitting: Internal Medicine

## 2013-11-15 ENCOUNTER — Ambulatory Visit (HOSPITAL_BASED_OUTPATIENT_CLINIC_OR_DEPARTMENT_OTHER): Payer: Medicare Other

## 2013-11-15 ENCOUNTER — Ambulatory Visit (HOSPITAL_BASED_OUTPATIENT_CLINIC_OR_DEPARTMENT_OTHER): Payer: Medicare Other | Admitting: Hematology

## 2013-11-15 ENCOUNTER — Other Ambulatory Visit (HOSPITAL_BASED_OUTPATIENT_CLINIC_OR_DEPARTMENT_OTHER): Payer: Medicare Other

## 2013-11-15 ENCOUNTER — Telehealth: Payer: Self-pay | Admitting: Hematology

## 2013-11-15 VITALS — BP 151/63 | HR 80 | Temp 97.7°F | Resp 18 | Ht 63.0 in | Wt 171.3 lb

## 2013-11-15 DIAGNOSIS — N189 Chronic kidney disease, unspecified: Secondary | ICD-10-CM

## 2013-11-15 DIAGNOSIS — D631 Anemia in chronic kidney disease: Secondary | ICD-10-CM

## 2013-11-15 LAB — CBC WITH DIFFERENTIAL/PLATELET
BASO%: 1.3 % (ref 0.0–2.0)
BASOS ABS: 0.1 10*3/uL (ref 0.0–0.1)
EOS%: 4.1 % (ref 0.0–7.0)
Eosinophils Absolute: 0.3 10*3/uL (ref 0.0–0.5)
HCT: 31.2 % — ABNORMAL LOW (ref 34.8–46.6)
HEMOGLOBIN: 10.3 g/dL — AB (ref 11.6–15.9)
LYMPH#: 1.9 10*3/uL (ref 0.9–3.3)
LYMPH%: 30.2 % (ref 14.0–49.7)
MCH: 30.3 pg (ref 25.1–34.0)
MCHC: 32.9 g/dL (ref 31.5–36.0)
MCV: 92.1 fL (ref 79.5–101.0)
MONO#: 0.6 10*3/uL (ref 0.1–0.9)
MONO%: 8.7 % (ref 0.0–14.0)
NEUT#: 3.5 10*3/uL (ref 1.5–6.5)
NEUT%: 55.7 % (ref 38.4–76.8)
PLATELETS: 335 10*3/uL (ref 145–400)
RBC: 3.39 10*6/uL — ABNORMAL LOW (ref 3.70–5.45)
RDW: 14 % (ref 11.2–14.5)
WBC: 6.3 10*3/uL (ref 3.9–10.3)

## 2013-11-15 LAB — COMPREHENSIVE METABOLIC PANEL (CC13)
ALK PHOS: 101 U/L (ref 40–150)
ALT: 20 U/L (ref 0–55)
ANION GAP: 8 meq/L (ref 3–11)
AST: 28 U/L (ref 5–34)
Albumin: 3.3 g/dL — ABNORMAL LOW (ref 3.5–5.0)
BILIRUBIN TOTAL: 0.53 mg/dL (ref 0.20–1.20)
BUN: 42.9 mg/dL — ABNORMAL HIGH (ref 7.0–26.0)
CO2: 27 meq/L (ref 22–29)
CREATININE: 2 mg/dL — AB (ref 0.6–1.1)
Calcium: 9.4 mg/dL (ref 8.4–10.4)
Chloride: 105 mEq/L (ref 98–109)
Glucose: 101 mg/dl (ref 70–140)
Potassium: 3.7 mEq/L (ref 3.5–5.1)
SODIUM: 140 meq/L (ref 136–145)
Total Protein: 6.6 g/dL (ref 6.4–8.3)

## 2013-11-15 MED ORDER — DARBEPOETIN ALFA-POLYSORBATE 200 MCG/0.4ML IJ SOLN
200.0000 ug | Freq: Once | INTRAMUSCULAR | Status: AC
  Administered 2013-11-15: 200 ug via SUBCUTANEOUS
  Filled 2013-11-15: qty 0.4

## 2013-11-15 NOTE — Telephone Encounter (Signed)
, °

## 2013-11-15 NOTE — Patient Instructions (Signed)
Darbepoetin Alfa injection What is this medicine? DARBEPOETIN ALFA (dar be POE e tin AL fa) helps your body make more red blood cells. It is used to treat anemia caused by chronic kidney failure and chemotherapy. This medicine may be used for other purposes; ask your health care provider or pharmacist if you have questions. COMMON BRAND NAME(S): Aranesp What should I tell my health care provider before I take this medicine? They need to know if you have any of these conditions: -blood clotting disorders or history of blood clots -cancer patient not on chemotherapy -cystic fibrosis -heart disease, such as angina, heart failure, or a history of a heart attack -hemoglobin level of 12 g/dL or greater -high blood pressure -low levels of folate, iron, or vitamin B12 -seizures -an unusual or allergic reaction to darbepoetin, erythropoietin, albumin, hamster proteins, latex, other medicines, foods, dyes, or preservatives -pregnant or trying to get pregnant -breast-feeding How should I use this medicine? This medicine is for injection into a vein or under the skin. It is usually given by a health care professional in a hospital or clinic setting. If you get this medicine at home, you will be taught how to prepare and give this medicine. Do not shake the solution before you withdraw a dose. Use exactly as directed. Take your medicine at regular intervals. Do not take your medicine more often than directed. It is important that you put your used needles and syringes in a special sharps container. Do not put them in a trash can. If you do not have a sharps container, call your pharmacist or healthcare provider to get one. Talk to your pediatrician regarding the use of this medicine in children. While this medicine may be used in children as young as 1 year for selected conditions, precautions do apply. Overdosage: If you think you have taken too much of this medicine contact a poison control center or  emergency room at once. NOTE: This medicine is only for you. Do not share this medicine with others. What if I miss a dose? If you miss a dose, take it as soon as you can. If it is almost time for your next dose, take only that dose. Do not take double or extra doses. What may interact with this medicine? Do not take this medicine with any of the following medications: -epoetin alfa This list may not describe all possible interactions. Give your health care provider a list of all the medicines, herbs, non-prescription drugs, or dietary supplements you use. Also tell them if you smoke, drink alcohol, or use illegal drugs. Some items may interact with your medicine. What should I watch for while using this medicine? Visit your prescriber or health care professional for regular checks on your progress and for the needed blood tests and blood pressure measurements. It is especially important for the doctor to make sure your hemoglobin level is in the desired range, to limit the risk of potential side effects and to give you the best benefit. Keep all appointments for any recommended tests. Check your blood pressure as directed. Ask your doctor what your blood pressure should be and when you should contact him or her. As your body makes more red blood cells, you may need to take iron, folic acid, or vitamin B supplements. Ask your doctor or health care provider which products are right for you. If you have kidney disease continue dietary restrictions, even though this medication can make you feel better. Talk with your doctor or health   care professional about the foods you eat and the vitamins that you take. What side effects may I notice from receiving this medicine? Side effects that you should report to your doctor or health care professional as soon as possible: -allergic reactions like skin rash, itching or hives, swelling of the face, lips, or tongue -breathing problems -changes in vision -chest  pain -confusion, trouble speaking or understanding -feeling faint or lightheaded, falls -high blood pressure -muscle aches or pains -pain, swelling, warmth in the leg -rapid weight gain -severe headaches -sudden numbness or weakness of the face, arm or leg -trouble walking, dizziness, loss of balance or coordination -seizures (convulsions) -swelling of the ankles, feet, hands -unusually weak or tired Side effects that usually do not require medical attention (report to your doctor or health care professional if they continue or are bothersome): -diarrhea -fever, chills (flu-like symptoms) -headaches -nausea, vomiting -redness, stinging, or swelling at site where injected This list may not describe all possible side effects. Call your doctor for medical advice about side effects. You may report side effects to FDA at 1-800-FDA-1088. Where should I keep my medicine? Keep out of the reach of children. Store in a refrigerator between 2 and 8 degrees C (36 and 46 degrees F). Do not freeze. Do not shake. Throw away any unused portion if using a single-dose vial. Throw away any unused medicine after the expiration date. NOTE: This sheet is a summary. It may not cover all possible information. If you have questions about this medicine, talk to your doctor, pharmacist, or health care provider.  2015, Elsevier/Gold Standard. (2007-12-22 10:23:57)  

## 2013-11-15 NOTE — Progress Notes (Signed)
[facility] PROGRESS NOTE DATE OF VISIT: 11/15/2013  [name], MD [place] [id number]  DIAGNOSIS: Anemia in chronic renal disease - Plan: CBC with Differential  Chief Complaint  Patient presents with  . Follow-up    CURRENT THERAPY: Aranesp 200 mcg subcutaneous every 2 months for a hemoglobin less than 11.   INTERVAL HISTORY: [name] [name] 80 y.o. female with a history of anemia secondary to renal insufficiency is here for follow-up. She was last seen by me on 12/14/2012.  She has been followed by [facility] for the Sprint study which is about high and low blood pressure.  She has been a participant for 3 years or more. She reports being admitted 03/01/2013 for the study and as she was walking down for enrollment in the parking deck with palpitations and increased pounding. She collapsed in the office and subsequent EMS.  Next, she had flu and acute kidney failure follow receipt of dye contrast.  She underwent CABG x 4 (SVG d RCA, SVG OM1, distal circumflex and LIMA LAD) on March 05th.  She was hospitalized at [facility] until March 19th (Dr. Humphrey Rolls).  She went to [place] for rehab.  On March 20th,she celebrated her [age] birthday.  She left the [facility] on April 25th.   She is now at home in [place].  She lives alone and is now under advanced home care.  A friend brought her to this doctor's appointment. SHE DID GET HER FLU SHOT THIS YEAR. Her aranesp records from last few months indicate the following:    CBC and hemoglobin trend is as follows:           MEDICAL HISTORY: Past Medical History  Diagnosis Date  . Unspecified essential hypertension     Dr [name]  . Other and unspecified hyperlipidemia   . Unspecified disorder resulting from impaired renal function   . Diverticulosis of colon with hemorrhage   . Unspecified vitamin D deficiency   . Unspecified asthma(493.90)   . Persistent disorder of  initiating or maintaining sleep   . Lumbago   . Esophageal reflux   . Diverticulosis of colon (without mention of hemorrhage)   . Depressive disorder, not elsewhere classified   . Anxiety state, unspecified   . Anemia, unspecified   . Unspecified osteomyelitis, site unspecified   . Myocardial infarction   . Thyroid disease    INTERIM HISTORY: has Vitamin D deficiency; HYPERLIPIDEMIA; DEHYDRATION (VOLUME DEPLETION); ANXIETY; INSOMNIA, PERSISTENT; DEPRESSION; Essential hypertension; Coronary atherosclerosis; CAROTID ARTERY DISEASE; PERIPHERAL VASCULAR DISEASE; BRONCHITIS NOT SPECIFIED AS ACUTE OR CHRONIC; Asthma; GERD; DIVERTICULOSIS, COLON; Diverticulosis of colon with hemorrhage; PANCREATITIS; Disorder resulting from impaired renal function; PYELONEPHRITIS; UNSPECIFIED DISORDER OF URETHRA&URINARY TRACT; LOW BACK PAIN; OSTEOMYELITIS; SYNCOPE; FATIGUE; Anemia in chronic renal disease; Cramps, extremity; Nausea alone; Unspecified constipation; Unspecified hypothyroidism; Cough; A-fib; CHF (congestive heart failure); PNA (pneumonia); Thyroid nodule; Influenza due to influenza A virus; Minimal cognitive impairment; Neuropathy; Acute non-ST segment elevation myocardial infarction; Disuse syndrome; Restless leg; Left leg weakness; Fall against object; Left ankle pain; Primary localized osteoarthrosis, lower leg; Alopecia; Pes anserine bursitis; and Situational depression on her problem list.    ALLERGIES:  is allergic to codeine sulfate; darvon; propoxyphene napsylate; sulfacetamide sodium-sulfur; sulfamethoxazole-trimethoprim; and zolpidem.  MEDICATIONS: has a current medication list which includes the following prescription(s): acetaminophen, albuterol, aspirin, atorvastatin, b complex vitamins, cholecalciferol, darbepoetin, furosemide, levothyroxine, lorazepam, metoprolol tartrate, mirtazapine, and sennosides-docusate sodium.  SURGICAL HISTORY:  Past  Surgical History  Procedure Laterality Date  .  Bone marrow biopsy    . Status post right carotid enterectomy    . Partial colectomy  1995    diverticulosis of colon iwth hemorrhage.   . Coronary artery bypass graft  03/25/13    x4. CABS ON PUMP-Surgeon Jomarie Longs.    REVIEW OF SYSTEMS:   Constitutional: Denies fevers, chills; 14 lb weight lost doing recent hospitalization but now regaining her  Eyes: Denies blurriness of vision Ears, nose, mouth, throat, and face: Denies mucositis or sore throat Respiratory: Denies cough, dyspnea or wheezes Cardiovascular: Denies palpitation, chest discomfort or lower extremity swelling Gastrointestinal:  Denies nausea, heartburn or change in bowel habits Skin: Denies abnormal skin rashes Lymphatics: Denies new lymphadenopathy or easy bruising Neurological:Denies numbness, tingling or new weaknesses Behavioral/Psych: Mood is stable, no new changes  All other systems were reviewed with the patient and are negative.  PHYSICAL EXAMINATION: ECOG PERFORMANCE STATUS: 0-1  Blood pressure 151/63, pulse 80, temperature 97.7 F (36.5 C), temperature source Oral, resp. rate 18, height _0  (1.6 m), weight 171 lb 4.8 oz (77.701 kg), SpO2 100.00%. (178 lbs last visit).   GENERAL:alert, no distress and comfortable; elderly female who is well developed, well nourished.  SKIN: skin color, texture, turgor are normal, no rashes or significant lesions; scar on chest well healed.  EYES: normal, Conjunctiva are pink and non-injected, sclera clear OROPHARYNX:no exudate, no erythema and lips, buccal mucosa, and tongue normal  NECK: supple, thyroid normal size, non-tender, without nodularity LYMPH:  no palpable lymphadenopathy in the cervical, axillary or supraclavicular LUNGS: clear to auscultation and percussion with normal breathing effort HEART: regular rate & rhythm and no murmurs and no lower extremity edema ABDOMEN:abdomen soft, non-tender and normal bowel sounds Musculoskeletal:no cyanosis of digits and no  clubbing  NEURO: alert & oriented x 3 with fluent speech, no focal motor/sensory deficits  LABORATORY DATA: Results for orders placed in visit on 11/15/13 (from the past 48 hour(s))  COMPREHENSIVE METABOLIC PANEL (MV78)     Status: Abnormal   Collection Time    11/15/13 10:38 AM      Result Value Ref Range   Sodium 140  136 - 145 mEq/L   Potassium 3.7  3.5 - 5.1 mEq/L   Chloride 105  98 - 109 mEq/L   CO2 27  22 - 29 mEq/L   Glucose 101  70 - 140 mg/dl   BUN 42.9 (*) 7.0 - 26.0 mg/dL   Creatinine 2.0 (*) 0.6 - 1.1 mg/dL   Total Bilirubin 0.53  0.20 - 1.20 mg/dL   Alkaline Phosphatase 101  40 - 150 U/L   AST 28  5 - 34 U/L   ALT 20  0 - 55 U/L   Total Protein 6.6  6.4 - 8.3 g/dL   Albumin 3.3 (*) 3.5 - 5.0 g/dL   Calcium 9.4  8.4 - 10.4 mg/dL   Anion Gap 8  3 - 11 mEq/L  CBC WITH DIFFERENTIAL     Status: Abnormal   Collection Time    11/15/13 10:38 AM      Result Value Ref Range   WBC 6.3  3.9 - 10.3 10e3/uL   NEUT# 3.5  1.5 - 6.5 10e3/uL   HGB 10.3 (*) 11.6 - 15.9 g/dL   HCT 31.2 (*) 34.8 - 46.6 %   Platelets 335  145 - 400 10e3/uL   MCV 92.1  79.5 - 101.0 fL   MCH 30.3  25.1 -  34.0 pg   MCHC 32.9  31.5 - 36.0 g/dL   RBC 3.39 (*) 3.70 - 5.45 10e6/uL   RDW 14.0  11.2 - 14.5 %   lymph# 1.9  0.9 - 3.3 10e3/uL   MONO# 0.6  0.1 - 0.9 10e3/uL   Eosinophils Absolute 0.3  0.0 - 0.5 10e3/uL   Basophils Absolute 0.1  0.0 - 0.1 10e3/uL   NEUT% 55.7  38.4 - 76.8 %   LYMPH% 30.2  14.0 - 49.7 %   MONO% 8.7  0.0 - 14.0 %   EOS% 4.1  0.0 - 7.0 %   BASO% 1.3  0.0 - 2.0 %    Labs:  Lab Results  Component Value Date   WBC 6.3 11/15/2013   HGB 10.3* 11/15/2013   HCT 31.2* 11/15/2013   MCV 92.1 11/15/2013   PLT 335 11/15/2013   NEUTROABS 3.5 11/15/2013      Chemistry      Component Value Date/Time   NA 140 11/15/2013 1038   NA 140 10/01/2013 0950   NA 139 04/22/2013   K 3.7 11/15/2013 1038   K 3.6 10/01/2013 0950   CL 102 10/01/2013 0950   CL 105 06/29/2012 1143   CO2 27  11/15/2013 1038   CO2 28 10/01/2013 0950   BUN 42.9* 11/15/2013 1038   BUN 38* 10/01/2013 0950   BUN 15 04/22/2013   CREATININE 2.0* 11/15/2013 1038   CREATININE 2.2* 10/01/2013 0950   CREATININE 2.3* 04/22/2013   GLU 82 04/22/2013      Component Value Date/Time   CALCIUM 9.4 11/15/2013 1038   CALCIUM 9.2 10/01/2013 0950   ALKPHOS 101 11/15/2013 1038   ALKPHOS 122* 10/01/2013 0950   AST 28 11/15/2013 1038   AST 24 10/01/2013 0950   ALT 20 11/15/2013 1038   ALT 12 10/01/2013 0950   BILITOT 0.53 11/15/2013 1038   BILITOT 0.6 10/01/2013 0950     Basic Metabolic Panel:  Recent Labs Lab 11/15/13 1038  NA 140  K 3.7  CO2 27  GLUCOSE 101  BUN 42.9*  CREATININE 2.0*  CALCIUM 9.4   GFR Estimated Creatinine Clearance: 22.1 ml/min (by C-G formula based on Cr of 2). Liver Function Tests:  Recent Labs Lab 11/15/13 1038  AST 28  ALT 20  ALKPHOS 101  BILITOT 0.53  PROT 6.6  ALBUMIN 3.3*   CBC:  Recent Labs Lab 11/15/13 1038  WBC 6.3  NEUTROABS 3.5  HGB 10.3*  HCT 31.2*  MCV 92.1  PLT 335   Studies:  No results found.   RADIOGRAPHIC STUDIES:  CXR 05/05/2013      ASSESSMENT: [name] [name] 80 y.o. female with a history of Anemia in chronic renal disease - Plan: CBC with Differential   PLAN:  1. Anemia in chronic renal disease.   --We will continue CBC every 2 months and give aranesp for hemoglobin less than 11.  Hemoglobin 10.3 grams today. Patient is agreeable to this plan. Recommend adding Vitamin C daily to B12, Biotin and Vitamin D.  2. Chronic kidney disease.  --Avoid nephrotoxins and counseled on continued adequate hydration.  Her creatinine is 2.0 today and stable.    3. Follow-up.   --RTC in 6 months with repeat labs including CBC, CBC every 2 months as outlined above.    All questions were answered. The patient knows to call the clinic with any problems, questions or concerns. We can certainly see the patient much sooner if necessary.  I spent 10  minutes counseling the patient face to face. The total time spent in the appointment was 15 minutes.    Bernadene Bell, MD Medical Hematologist/Oncologist [place] Pager: [phone] Office No: [phone]

## 2013-11-16 ENCOUNTER — Telehealth: Payer: Self-pay | Admitting: *Deleted

## 2013-11-16 NOTE — Telephone Encounter (Signed)
Left msg on triage requesting call bck. Called pt back she is wanting to know what time is her appt with Dr. Alain Marion on Nov 11th. Inform pt per record she is not schedule to see md on Nov 11th. Pt is schedule to see Dr. Alain Marion  On Dec 21 @ 8:45. Did inform her she has an appt to see Dr. Tamala Julian on Nov 9th. She stated she had that appt written down already...Johny Chess

## 2013-11-29 ENCOUNTER — Encounter: Payer: Self-pay | Admitting: Family Medicine

## 2013-11-29 ENCOUNTER — Ambulatory Visit (INDEPENDENT_AMBULATORY_CARE_PROVIDER_SITE_OTHER): Payer: Medicare Other | Admitting: Family Medicine

## 2013-11-29 VITALS — BP 130/82 | HR 80 | Ht 63.0 in | Wt 167.0 lb

## 2013-11-29 DIAGNOSIS — M1712 Unilateral primary osteoarthritis, left knee: Secondary | ICD-10-CM | POA: Diagnosis not present

## 2013-11-29 DIAGNOSIS — I2510 Atherosclerotic heart disease of native coronary artery without angina pectoris: Secondary | ICD-10-CM | POA: Diagnosis not present

## 2013-11-29 NOTE — Progress Notes (Signed)
  [name] [address], [place] [address] Phone: [phone] Subjective:      CC: Left leg pain follow up  [id number] [name] [name] is a 80 y.o. female coming in with complaint of left leg pain. Patient was seen by me 3 weeks ago. Patient was given an injection in her left knee for degenerative meniscal tear as well as moderate arthritic changes.x-rays previously taken do agree with the moderate osteophytic changes. This was back in August 2015. Patient also had a pes anserine bursa and states that that has gotten better. Patient stating that the left knee pain though is worsening again. Patient states it is more of a dull throbbing aching sensation at the end of a long day. Patient denies any swelling around the area. Patient states that it is very sore and isn't near as bad as it was prior to the first injection. Patient denies any new symptoms.   Past medical history, social, surgical and family history all reviewed in electronic medical record.   Review of Systems: No headache, visual changes, nausea, vomiting, diarrhea, constipation, dizziness, abdominal pain, skin rash, fevers, chills, night sweats, weight loss, swollen lymph nodes, body aches, joint swelling, muscle aches, chest pain, shortness of breath, mood changes.   Objective Blood pressure 130/82, pulse 80, height 5\' 3"  (1.6 m), weight 167 lb (75.751 kg), SpO2 98 %.  General: No apparent distress alert and oriented x3 mood and affect normal, dressed appropriately.  HEENT: Pupils equal, extraocular movements intact  Respiratory: Patient's speak in full sentences and does not appear short of breath  Cardiovascular: No lower extremity edema, non tender, no erythema  Skin: Warm dry intact with no signs of infection or rash on extremities or on axial skeleton.  Abdomen: Soft nontender  Neuro: Cranial nerves II through XII are intact, neurovascularly intact in all extremities with 2+ DTRs  and 2+ pulses.  Lymph: No lymphadenopathy of posterior or anterior cervical chain or axillae bilaterally.  Gait ambulates with kyphosis and a walker MSK:  mild tender with near full range of motion and good stability and symmetric strength and tone of shoulders, elbows, wrist, hips bilaterally. Multiple osteoarthritic changes in multiple joints. Knee: Left Normal to inspection with no erythema or effusion or obvious bony abnormalities. Lax last 5 of extension and less 5 of flexion Ligaments with solid consistent endpoints including ACL, PCL, LCL, MCL. Positive Mcmurray's, Apley's, and Thessalonian tests. Moderate tenderness over medial joint line but no tenderness over the pes anserine bursa Non painful patellar compression. Patellar glide with minimal crepitus. Patellar and quadriceps tendons unremarkable. Hamstring and quadriceps strength is normal.   contralateral knee has some minimal tenderness to palpation as well.  After informed written and verbal consent, patient was seated on exam table. Left knee was prepped with alcohol swab and utilizing anterolateral approach, patient's left knee space was injected with 4:1  marcaine 0.5%: Kenalog 40mg /dL. Patient tolerated the procedure well without immediate complications.     Impression and Recommendations:     This case required medical decision making of moderate complexity.

## 2013-11-29 NOTE — Patient Instructions (Signed)
Good to see you Ice is your friend Continue the brace Continue the exercises Enjoy the grand kids and the dogs.  Physical therapy will be calling you If not decrease in pain in next month then will consider Orthovisc.  Please look over handout See me again in 4 weeks.

## 2013-11-29 NOTE — Assessment & Plan Note (Signed)
Patient was given another steroid injection today. Patient did tolerate the procedure very well. We discussed continuing the icing pedicle. We discussed formal physical therapy which I think will be beneficial and was referred today. We discussed icing regimen. Patient will continue with some of the topical medications as well as the over-the-counter natural supplementations. Patient will come back and see me again in 3 weeks. If continuing to have pain she may be candidate for viscous supplementation.

## 2013-12-02 ENCOUNTER — Ambulatory Visit: Payer: Self-pay | Admitting: Internal Medicine

## 2013-12-09 ENCOUNTER — Ambulatory Visit (HOSPITAL_COMMUNITY): Payer: Medicare Other | Attending: Cardiovascular Disease | Admitting: Cardiology

## 2013-12-09 DIAGNOSIS — E785 Hyperlipidemia, unspecified: Secondary | ICD-10-CM | POA: Insufficient documentation

## 2013-12-09 DIAGNOSIS — I2510 Atherosclerotic heart disease of native coronary artery without angina pectoris: Secondary | ICD-10-CM | POA: Diagnosis not present

## 2013-12-09 DIAGNOSIS — I10 Essential (primary) hypertension: Secondary | ICD-10-CM | POA: Insufficient documentation

## 2013-12-09 DIAGNOSIS — I6523 Occlusion and stenosis of bilateral carotid arteries: Secondary | ICD-10-CM | POA: Diagnosis not present

## 2013-12-09 NOTE — Progress Notes (Signed)
Carotid duplex performed 

## 2013-12-13 ENCOUNTER — Telehealth: Payer: Self-pay | Admitting: Cardiovascular Disease

## 2013-12-13 NOTE — Telephone Encounter (Signed)
New Msg   Patient returning call. Please contact at [phone]

## 2013-12-13 NOTE — Telephone Encounter (Signed)
PT AWARE OF CAROTID RESULTS ./CY 

## 2013-12-27 ENCOUNTER — Other Ambulatory Visit (INDEPENDENT_AMBULATORY_CARE_PROVIDER_SITE_OTHER): Payer: Medicare Other

## 2013-12-27 ENCOUNTER — Ambulatory Visit (INDEPENDENT_AMBULATORY_CARE_PROVIDER_SITE_OTHER): Payer: Medicare Other | Admitting: Internal Medicine

## 2013-12-27 ENCOUNTER — Encounter: Payer: Self-pay | Admitting: Internal Medicine

## 2013-12-27 VITALS — BP 165/95 | HR 82 | Temp 97.5°F | Resp 20 | Ht 63.0 in | Wt 174.0 lb

## 2013-12-27 DIAGNOSIS — L659 Nonscarring hair loss, unspecified: Secondary | ICD-10-CM

## 2013-12-27 DIAGNOSIS — R5382 Chronic fatigue, unspecified: Secondary | ICD-10-CM

## 2013-12-27 DIAGNOSIS — I2510 Atherosclerotic heart disease of native coronary artery without angina pectoris: Secondary | ICD-10-CM | POA: Diagnosis not present

## 2013-12-27 DIAGNOSIS — I10 Essential (primary) hypertension: Secondary | ICD-10-CM

## 2013-12-27 DIAGNOSIS — I5022 Chronic systolic (congestive) heart failure: Secondary | ICD-10-CM

## 2013-12-27 DIAGNOSIS — E559 Vitamin D deficiency, unspecified: Secondary | ICD-10-CM | POA: Diagnosis not present

## 2013-12-27 DIAGNOSIS — F4321 Adjustment disorder with depressed mood: Secondary | ICD-10-CM

## 2013-12-27 LAB — BASIC METABOLIC PANEL
BUN: 39 mg/dL — ABNORMAL HIGH (ref 6–23)
CHLORIDE: 103 meq/L (ref 96–112)
CO2: 26 mEq/L (ref 19–32)
Calcium: 9.2 mg/dL (ref 8.4–10.5)
Creatinine, Ser: 2.1 mg/dL — ABNORMAL HIGH (ref 0.4–1.2)
GFR: 23.91 mL/min — ABNORMAL LOW (ref >60.00)
Glucose, Bld: 101 mg/dL — ABNORMAL HIGH (ref 70–99)
Potassium: 3.4 mEq/L — ABNORMAL LOW (ref 3.5–5.1)
Sodium: 138 mEq/L (ref 135–145)

## 2013-12-27 LAB — TSH: TSH: 5.8 u[IU]/mL — AB (ref 0.35–4.50)

## 2013-12-27 LAB — T4, FREE: Free T4: 1.53 ng/dL (ref 0.60–1.60)

## 2013-12-27 NOTE — Progress Notes (Signed)
Subjective:     Hypertension Pertinent negatives include no headaches.   Pt c/o stress today: car wouldn't start. BP is ok at home  F/u hair loss x 2 month - better F/u L leg would give way- weak L knee - better   F/u pain in R thigh - better  Had an MI/CABG in Feb 2015 Hospitalized at [facility] for syncope in March Subsequently had chest pain and cath done Had CABG with Dr Clementeen Graham  SVG d RCA, SVG OM1, distal circumflex and LIMA LAD  Had brief periop afib Rx with amidarone and left hospital in [place] Back home from [place] 2 wks ago. In PT at home now 5/15). The patient presents for a follow-up of  chronic hypertension, chronic dyslipidemia, anemia, CRI, vit D def  Wt Readings from Last 3 Encounters:  12/27/13 174 lb (78.926 kg)  11/29/13 167 lb (75.751 kg)  11/15/13 171 lb 4.8 oz (77.701 kg)   BP Readings from Last 3 Encounters:  12/27/13 180/100  11/29/13 130/82  11/15/13 151/63     Review of Systems  Constitutional: Negative for chills, activity change, appetite change, fatigue and unexpected weight change.  HENT: Negative for congestion, mouth sores and sinus pressure.   Eyes: Negative for visual disturbance.  Respiratory: Negative for cough and chest tightness.   Cardiovascular: Positive for leg swelling.  Gastrointestinal: Negative for nausea and abdominal pain.  Genitourinary: Negative for frequency, difficulty urinating and vaginal pain.  Musculoskeletal: Negative for back pain and gait problem.       Cramps  Skin: Negative for pallor and rash.  Neurological: Negative for dizziness, tremors, weakness and headaches.  Psychiatric/Behavioral: Positive for sleep disturbance. Negative for suicidal ideas and confusion. The patient is nervous/anxious.        Objective:   Physical Exam  Constitutional: She appears well-developed. No distress.  HENT:  Head: Normocephalic.  Right Ear: External ear normal.  Left Ear: External ear normal.  Nose: Nose normal.   Mouth/Throat: Oropharynx is clear and moist.  Eyes: Conjunctivae are normal. Pupils are equal, round, and reactive to light. Right eye exhibits no discharge. Left eye exhibits no discharge.  Neck: Normal range of motion. Neck supple. No JVD present. No tracheal deviation present. No thyromegaly present.  Cardiovascular: Normal rate, regular rhythm and normal heart sounds.   Pulmonary/Chest: No stridor. No respiratory distress. She has no wheezes.  Abdominal: Soft. Bowel sounds are normal. She exhibits no distension and no mass. There is no tenderness. There is no rebound and no guarding.  Musculoskeletal: She exhibits no edema or tenderness.  Lymphadenopathy:    She has no cervical adenopathy.  Neurological: She displays normal reflexes. No cranial nerve deficit. She exhibits normal muscle tone. Coordination normal.  Skin: No rash noted. No erythema.  Psychiatric: She has a normal mood and affect. Her behavior is normal. Judgment and thought content normal.  Walker L knee is in a brace - better L hip w/nl ROM Knee extensors on L knee 5-/5    Lab Results  Component Value Date   WBC 6.3 11/15/2013   HGB 10.3* 11/15/2013   HCT 31.2* 11/15/2013   PLT 335 11/15/2013   GLUCOSE 101 11/15/2013   CHOL 92 04/15/2013   TRIG 62 04/15/2013   HDL 40 04/15/2013   LDLCALC 30 04/15/2013   ALT 20 11/15/2013   AST 28 11/15/2013   NA 140 11/15/2013   K 3.7 11/15/2013   CL 102 10/01/2013   CREATININE 2.0* 11/15/2013  BUN 42.9* 11/15/2013   CO2 27 11/15/2013   TSH 5.21* 10/01/2013   INR 1.1 09/08/2008        Assessment & Plan:

## 2013-12-27 NOTE — Progress Notes (Signed)
Pre visit review using our clinic review tool, if applicable. No additional management support is needed unless otherwise documented below in the visit note. 

## 2013-12-27 NOTE — Assessment & Plan Note (Signed)
Continue with current prescription therapy as reflected on the Med list.  

## 2013-12-27 NOTE — Assessment & Plan Note (Signed)
BP 165/90 Continue with current prescription therapy as reflected on the Med list.

## 2013-12-27 NOTE — Assessment & Plan Note (Signed)
Labs today

## 2013-12-28 ENCOUNTER — Telehealth: Payer: Self-pay | Admitting: Internal Medicine

## 2013-12-28 ENCOUNTER — Other Ambulatory Visit: Payer: Self-pay | Admitting: Internal Medicine

## 2013-12-28 MED ORDER — POTASSIUM CHLORIDE ER 10 MEQ PO TBCR: 10.0000 meq | EXTENDED_RELEASE_TABLET | Freq: Every day | ORAL | Status: DC

## 2013-12-28 NOTE — Telephone Encounter (Signed)
emmi mailed  °

## 2013-12-29 ENCOUNTER — Other Ambulatory Visit: Payer: Self-pay | Admitting: Internal Medicine

## 2013-12-31 ENCOUNTER — Other Ambulatory Visit: Payer: Self-pay

## 2013-12-31 DIAGNOSIS — Z1231 Encounter for screening mammogram for malignant neoplasm of breast: Secondary | ICD-10-CM

## 2014-01-03 ENCOUNTER — Encounter: Payer: Self-pay | Admitting: Family Medicine

## 2014-01-03 ENCOUNTER — Ambulatory Visit (INDEPENDENT_AMBULATORY_CARE_PROVIDER_SITE_OTHER): Payer: Medicare Other | Admitting: Family Medicine

## 2014-01-03 VITALS — BP 130/62 | HR 87 | Ht 63.0 in | Wt 175.0 lb

## 2014-01-03 DIAGNOSIS — I2510 Atherosclerotic heart disease of native coronary artery without angina pectoris: Secondary | ICD-10-CM | POA: Diagnosis not present

## 2014-01-03 DIAGNOSIS — M1712 Unilateral primary osteoarthritis, left knee: Secondary | ICD-10-CM

## 2014-01-03 NOTE — Progress Notes (Signed)
  [name] [address], [place] [address] Phone: [phone] Subjective:      CC: Left leg pain follow up  [id number] [name] [name] is a 80 y.o. female coming in with complaint of left leg pain. Patient was seen by me for degenerative meniscal tear as well as moderate arthritic changes.x-rays previously taken with the moderate osteophytic changes. Patient is been doing home exercises, icing protocol, as well as bracing. Patient states that she is a proximal Lasix T5 percent better. Patient has had a steroid injection in the knee previously. Patient feels that she can ambulate and do her daily activities. States that this is not slowing her down. Overall still has some discomfort and would put herself is 60% better. No nighttime awakening.   Past medical history, social, surgical and family history all reviewed in electronic medical record.   Review of Systems: No headache, visual changes, nausea, vomiting, diarrhea, constipation, dizziness, abdominal pain, skin rash, fevers, chills, night sweats, weight loss, swollen lymph nodes, body aches, joint swelling, muscle aches, chest pain, shortness of breath, mood changes.   Objective Blood pressure 130/62, weight 175 lb (79.379 kg).  General: No apparent distress alert and oriented x3 mood and affect normal, dressed appropriately.  HEENT: Pupils equal, extraocular movements intact  Respiratory: Patient's speak in full sentences and does not appear short of breath  Cardiovascular: No lower extremity edema, non tender, no erythema  Skin: Warm dry intact with no signs of infection or rash on extremities or on axial skeleton.  Abdomen: Soft nontender  Neuro: Cranial nerves II through XII are intact, neurovascularly intact in all extremities with 2+ DTRs and 2+ pulses.  Lymph: No lymphadenopathy of posterior or anterior cervical chain or axillae bilaterally.  Gait ambulates with kyphosis and a  walker MSK:  mild tender with near full range of motion and good stability and symmetric strength and tone of shoulders, elbows, wrist, hips bilaterally. Multiple osteoarthritic changes in multiple joints. Knee: Left Normal to inspection with no erythema or effusion or obvious bony abnormalities. Lacking last 5 of extension and less 5 of flexion Ligaments with solid consistent endpoints including ACL, PCL, LCL, MCL. Positive Mcmurray's, Apley's, and Thessalonian tests. Moderate tenderness over medial joint line still present Non painful patellar compression. Patellar glide with minimal crepitus. Patellar and quadriceps tendons unremarkable. Hamstring and quadriceps strength is normal.   contralateral knee has some minimal tenderness to palpation as well. Mild improvement from previous exam      Impression and Recommendations:     This case required medical decision making of moderate complexity.

## 2014-01-03 NOTE — Assessment & Plan Note (Signed)
Patient is doing relatively well at this time. Patient is one month out from last steroid injection. We discussed the possibility of viscous supplementation but with patient improving with conservative therapy will continue. Patient declined any formal physical therapy at this time due to other comorbidities and patient having to see other physicians. Patient will continue with the conservative therapy and will follow-up with me again in 6 weeks for further evaluation and treatment.

## 2014-01-03 NOTE — Patient Instructions (Signed)
Good to see you Ice is your friend Try the pennsaid twice daily Keep up with the exercises 2-3 times a week.  See me again in 6 weeks.

## 2014-01-06 ENCOUNTER — Other Ambulatory Visit: Payer: Self-pay | Admitting: Nurse Practitioner

## 2014-01-10 ENCOUNTER — Ambulatory Visit (HOSPITAL_BASED_OUTPATIENT_CLINIC_OR_DEPARTMENT_OTHER): Payer: Medicare Other

## 2014-01-10 DIAGNOSIS — N189 Chronic kidney disease, unspecified: Secondary | ICD-10-CM

## 2014-01-10 DIAGNOSIS — D631 Anemia in chronic kidney disease: Secondary | ICD-10-CM

## 2014-01-10 LAB — CBC WITH DIFFERENTIAL/PLATELET
BASO%: 0.7 % (ref 0.0–2.0)
Basophils Absolute: 0.1 10*3/uL (ref 0.0–0.1)
EOS%: 3.2 % (ref 0.0–7.0)
Eosinophils Absolute: 0.2 10*3/uL (ref 0.0–0.5)
HEMATOCRIT: 32.1 % — AB (ref 34.8–46.6)
HGB: 10.6 g/dL — ABNORMAL LOW (ref 11.6–15.9)
LYMPH%: 23.9 % (ref 14.0–49.7)
MCH: 30.7 pg (ref 25.1–34.0)
MCHC: 33 g/dL (ref 31.5–36.0)
MCV: 93 fL (ref 79.5–101.0)
MONO#: 0.6 10*3/uL (ref 0.1–0.9)
MONO%: 7.9 % (ref 0.0–14.0)
NEUT#: 4.5 10*3/uL (ref 1.5–6.5)
NEUT%: 64.3 % (ref 38.4–76.8)
Platelets: 346 10*3/uL (ref 145–400)
RBC: 3.45 10*6/uL — AB (ref 3.70–5.45)
RDW: 13.9 % (ref 11.2–14.5)
WBC: 6.9 10*3/uL (ref 3.9–10.3)
lymph#: 1.7 10*3/uL (ref 0.9–3.3)

## 2014-01-10 MED ORDER — DARBEPOETIN ALFA 200 MCG/0.4ML IJ SOSY
200.0000 ug | PREFILLED_SYRINGE | Freq: Once | INTRAMUSCULAR | Status: DC
  Filled 2014-01-10: qty 0.4

## 2014-01-10 MED ORDER — DARBEPOETIN ALFA-POLYSORBATE 500 MCG/ML IJ SOLN
200.0000 ug | Freq: Once | INTRAMUSCULAR | Status: DC
  Administered 2014-01-10: 200 ug via SUBCUTANEOUS

## 2014-01-10 NOTE — Addendum Note (Signed)
Addended by: Margaret Pyle on: 01/10/2014 11:00 AM   Modules accepted: Orders

## 2014-01-10 NOTE — Patient Instructions (Signed)
Darbepoetin Alfa injection What is this medicine? DARBEPOETIN ALFA (dar be POE e tin AL fa) helps your body make more red blood cells. It is used to treat anemia caused by chronic kidney failure and chemotherapy. This medicine may be used for other purposes; ask your health care provider or pharmacist if you have questions. COMMON BRAND NAME(S): Aranesp What should I tell my health care provider before I take this medicine? They need to know if you have any of these conditions: -blood clotting disorders or history of blood clots -cancer patient not on chemotherapy -cystic fibrosis -heart disease, such as angina, heart failure, or a history of a heart attack -hemoglobin level of 12 g/dL or greater -high blood pressure -low levels of folate, iron, or vitamin B12 -seizures -an unusual or allergic reaction to darbepoetin, erythropoietin, albumin, hamster proteins, latex, other medicines, foods, dyes, or preservatives -pregnant or trying to get pregnant -breast-feeding How should I use this medicine? This medicine is for injection into a vein or under the skin. It is usually given by a health care professional in a hospital or clinic setting. If you get this medicine at home, you will be taught how to prepare and give this medicine. Do not shake the solution before you withdraw a dose. Use exactly as directed. Take your medicine at regular intervals. Do not take your medicine more often than directed. It is important that you put your used needles and syringes in a special sharps container. Do not put them in a trash can. If you do not have a sharps container, call your pharmacist or healthcare provider to get one. Talk to your pediatrician regarding the use of this medicine in children. While this medicine may be used in children as young as 1 year for selected conditions, precautions do apply. Overdosage: If you think you have taken too much of this medicine contact a poison control center or  emergency room at once. NOTE: This medicine is only for you. Do not share this medicine with others. What if I miss a dose? If you miss a dose, take it as soon as you can. If it is almost time for your next dose, take only that dose. Do not take double or extra doses. What may interact with this medicine? Do not take this medicine with any of the following medications: -epoetin alfa This list may not describe all possible interactions. Give your health care provider a list of all the medicines, herbs, non-prescription drugs, or dietary supplements you use. Also tell them if you smoke, drink alcohol, or use illegal drugs. Some items may interact with your medicine. What should I watch for while using this medicine? Visit your prescriber or health care professional for regular checks on your progress and for the needed blood tests and blood pressure measurements. It is especially important for the doctor to make sure your hemoglobin level is in the desired range, to limit the risk of potential side effects and to give you the best benefit. Keep all appointments for any recommended tests. Check your blood pressure as directed. Ask your doctor what your blood pressure should be and when you should contact him or her. As your body makes more red blood cells, you may need to take iron, folic acid, or vitamin B supplements. Ask your doctor or health care provider which products are right for you. If you have kidney disease continue dietary restrictions, even though this medication can make you feel better. Talk with your doctor or health   care professional about the foods you eat and the vitamins that you take. What side effects may I notice from receiving this medicine? Side effects that you should report to your doctor or health care professional as soon as possible: -allergic reactions like skin rash, itching or hives, swelling of the face, lips, or tongue -breathing problems -changes in vision -chest  pain -confusion, trouble speaking or understanding -feeling faint or lightheaded, falls -high blood pressure -muscle aches or pains -pain, swelling, warmth in the leg -rapid weight gain -severe headaches -sudden numbness or weakness of the face, arm or leg -trouble walking, dizziness, loss of balance or coordination -seizures (convulsions) -swelling of the ankles, feet, hands -unusually weak or tired Side effects that usually do not require medical attention (report to your doctor or health care professional if they continue or are bothersome): -diarrhea -fever, chills (flu-like symptoms) -headaches -nausea, vomiting -redness, stinging, or swelling at site where injected This list may not describe all possible side effects. Call your doctor for medical advice about side effects. You may report side effects to FDA at 1-800-FDA-1088. Where should I keep my medicine? Keep out of the reach of children. Store in a refrigerator between 2 and 8 degrees C (36 and 46 degrees F). Do not freeze. Do not shake. Throw away any unused portion if using a single-dose vial. Throw away any unused medicine after the expiration date. NOTE: This sheet is a summary. It may not cover all possible information. If you have questions about this medicine, talk to your doctor, pharmacist, or health care provider.  2015, Elsevier/Gold Standard. (2007-12-22 10:23:57)  

## 2014-01-13 ENCOUNTER — Telehealth: Payer: Self-pay | Admitting: Internal Medicine

## 2014-01-13 MED ORDER — ALBUTEROL SULFATE HFA 108 (90 BASE) MCG/ACT IN AERS: 2.0000 | INHALATION_SPRAY | RESPIRATORY_TRACT | Status: DC | PRN

## 2014-01-13 NOTE — Telephone Encounter (Signed)
Pt requesting Pro-air or Ventil inhaler, needs emergency inhaler per pharmacist.   [phone] [facility]

## 2014-01-13 NOTE — Telephone Encounter (Signed)
Refill albuterol inhaler sent to [facility]...Johny Chess

## 2014-01-18 ENCOUNTER — Ambulatory Visit
Admission: RE | Admit: 2014-01-18 | Discharge: 2014-01-18 | Disposition: A | Discharge status: Home / Self Care | Payer: Medicare Other | Source: Ambulatory Visit

## 2014-01-18 DIAGNOSIS — Z1231 Encounter for screening mammogram for malignant neoplasm of breast: Secondary | ICD-10-CM

## 2014-02-14 ENCOUNTER — Ambulatory Visit: Payer: Self-pay | Admitting: Family Medicine

## 2014-02-21 ENCOUNTER — Encounter: Payer: Self-pay | Admitting: Family Medicine

## 2014-02-21 ENCOUNTER — Ambulatory Visit (INDEPENDENT_AMBULATORY_CARE_PROVIDER_SITE_OTHER): Payer: Medicare Other | Admitting: Family Medicine

## 2014-02-21 VITALS — BP 136/84 | HR 81 | Ht 63.0 in | Wt 180.0 lb

## 2014-02-21 DIAGNOSIS — M1712 Unilateral primary osteoarthritis, left knee: Secondary | ICD-10-CM

## 2014-02-21 NOTE — Assessment & Plan Note (Signed)
Patient is doing very well at this time. Patient will continue with the conservative therapy. It is okay for patient to start decreasing wearing the brace but suggested wearing it when she is doing a lot of activity. We discussed continuing the icing and the vitamins. Patient follow-up on an as-needed basis.

## 2014-02-21 NOTE — Progress Notes (Signed)
Pre visit review using our clinic review tool, if applicable. No additional management support is needed unless otherwise documented below in the visit note. 

## 2014-02-21 NOTE — Patient Instructions (Addendum)
Good to see you Ice will be your friend always.  Keep staying active.  Wear brace only when you need it.  Do not wear it at home.  Continue the vitamins See me when you need me.

## 2014-02-21 NOTE — Progress Notes (Signed)
  [name] [address], [place] [address] Phone: [phone] Subjective:      CC: Left leg pain follow up  [id number] [name] [name] is a 81 y.o. female coming in with complaint of left leg pain. Patient was seen by me for degenerative meniscal tear as well as moderate arthritic changes.x-rays previously taken with the moderate osteophytic changes. Patient is been doing home exercises, icing protocol, as well as bracing. Patient at last follow-up was doing well. Patient has had an injection in the knee previously back on November 9. Patient states overall she is doing significant better. Patient has been wearing the brace less and less. Patient is able to do daily activities without any discomfort. Patient is very happy with the results.   Past medical history, social, surgical and family history all reviewed in electronic medical record.   Review of Systems: No headache, visual changes, nausea, vomiting, diarrhea, constipation, dizziness, abdominal pain, skin rash, fevers, chills, night sweats, weight loss, swollen lymph nodes, body aches, joint swelling, muscle aches, chest pain, shortness of breath, mood changes.   Objective Blood pressure 136/84, pulse 81, height 5\' 3"  (1.6 m), weight 180 lb (81.647 kg), SpO2 97 %.  General: No apparent distress alert and oriented x3 mood and affect normal, dressed appropriately.  HEENT: Pupils equal, extraocular movements intact  Respiratory: Patient's speak in full sentences and does not appear short of breath  Cardiovascular: No lower extremity edema, non tender, no erythema  Skin: Warm dry intact with no signs of infection or rash on extremities or on axial skeleton.  Abdomen: Soft nontender  Neuro: Cranial nerves II through XII are intact, neurovascularly intact in all extremities with 2+ DTRs and 2+ pulses.  Lymph: No lymphadenopathy of posterior or anterior cervical chain or axillae bilaterally.  Gait  ambulates with kyphosis and a walker MSK:  mild tender with near full range of motion and good stability and symmetric strength and tone of shoulders, elbows, wrist, hips bilaterally. Multiple osteoarthritic changes in multiple joints. Knee: Left Normal to inspection with no erythema or effusion or obvious bony abnormalities. Lacking last 5 of extension and less 5 of flexion Ligaments with solid consistent endpoints including ACL, PCL, LCL, MCL. negativeMcmurray's, Apley's, and Thessalonian tests. minimaltenderness over medial joint line still present Non painful patellar compression. Patellar glide with minimal crepitus. Patellar and quadriceps tendons unremarkable. Hamstring and quadriceps strength is normal.   contralateral knee has some minimal tenderness to palpation as well. Continued improvement      Impression and Recommendations:     This case required medical decision making of moderate complexity.

## 2014-03-04 ENCOUNTER — Other Ambulatory Visit: Payer: Self-pay | Admitting: *Deleted

## 2014-03-04 DIAGNOSIS — N189 Chronic kidney disease, unspecified: Principal | ICD-10-CM

## 2014-03-04 DIAGNOSIS — D631 Anemia in chronic kidney disease: Secondary | ICD-10-CM

## 2014-03-07 ENCOUNTER — Telehealth: Payer: Self-pay | Admitting: Hematology

## 2014-03-07 ENCOUNTER — Ambulatory Visit: Payer: Self-pay

## 2014-03-07 ENCOUNTER — Other Ambulatory Visit: Payer: Self-pay

## 2014-03-07 NOTE — Telephone Encounter (Signed)
Pt called to r/s due to weather, pt confirmed labs/ov D/T.... Cherylann Banas

## 2014-03-08 NOTE — Progress Notes (Signed)
Patient ID: [name] [name], female   DOB: 1931-08-24, 81 y.o.   MRN: [id number]   [name] [name] returns today for F/U of HTN, palpitations and fatigue. She was hospitalized 2014 for a diverticular bleed. This seems stable and she has F/U with Dr. Earlean Shawl. She has had palpitations. Event monitor  Benign  with no significant arrythmias 2014 . She has been compliant with her BP meds. She has had a previous RCEA by Dr. Amedeo Plenty. 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 She denies SSCP, palpitations, dyspnea, PND orthopnea and edema   She is on a study at [facility] for BP And gets it followed every 3 months   Hospitalized at [facility] for syncope in March  2015 Subsequently had chest pain and cath done Had CABG  3/15 with Dr Clementeen Graham  SVG d RCA, SVG OM1, distal circumflex and LIMA LAD  Had brief periop afib Rx with amidarone and left hospital in [place]  12/09/13  60-79% left ICA stenosis  Needs f/u 6/16    Injured left knee and ankle getting cortisone shots   ROS: Denies fever, malais, weight loss, blurry vision, decreased visual acuity, cough, sputum, SOB, hemoptysis, pleuritic pain, palpitaitons, heartburn, abdominal pain, melena, lower extremity edema, claudication, or rash.  All other systems reviewed and negative  General: Affect appropriate Pale elderly female  HEENT: normal Neck supple with no adenopathy JVP normal  Left bruit   no thyromegaly Lungs clear with no wheezing and good diaphragmatic motion Heart:  S1/S2 no murmur, no rub, gallop or click post sternotimy PMI normal Abdomen: benighn, BS positve, no tenderness, no AAA no bruit.  No HSM or HJR Distal pulses intact with no bruits No edema Neuro non-focal Skin warm and dry No muscular weakness Brace on left knee    Current Outpatient Prescriptions  Medication Sig Dispense Refill  . acetaminophen (TYLENOL) 650 MG CR  tablet Take 325 mg by mouth every 8 (eight) hours.     [name] albuterol (PROAIR HFA) 108 (90 BASE) MCG/ACT inhaler Inhale 2 puffs into the lungs every 4 (four) hours as needed. 18 g 1  . albuterol (PROVENTIL) (5 MG/ML) 0.5% nebulizer solution Take 2.5 mg by nebulization every 6 (six) hours as needed for wheezing or shortness of breath.    [name] aspirin 325 MG EC tablet Take 325 mg by mouth daily.    [name] atorvastatin (LIPITOR) 40 MG tablet Take 1 tablet (40 mg total) by mouth daily. 30 tablet 11  . b complex vitamins capsule Take 1 capsule by mouth daily.    . Biotin 5000 MCG CAPS Take 1 capsule by mouth daily.    . Cholecalciferol (EQL VITAMIN D3) 1000 UNITS tablet Take 1 tablet (1,000 Units total) by mouth daily. 100 tablet 3  . furosemide (LASIX) 20 MG tablet Take 1 tablet (20 mg total) by mouth daily. 30 tablet 11  . levothyroxine (SYNTHROID, LEVOTHROID) 50 MCG tablet Take 1 tablet (50 mcg total) by mouth daily before breakfast. 90 tablet 3  . LORazepam (ATIVAN) 0.5 MG tablet Take 1 tablet (0.5 mg total) by mouth every 6 (six) hours as needed for anxiety. 30 tablet 2  . metoprolol tartrate (LOPRESSOR) 25 MG tablet Take 0.5 tablets (12.5 mg total) by mouth 2 (two) times daily. 30 tablet 11  . mirtazapine (REMERON) 15 MG tablet Take 1  tablet (15 mg total) by mouth at bedtime. 90 tablet 1  . mirtazapine (REMERON) 15 MG tablet TAKE 1 TABLET BY MOUTH AT BEDTIME 30 tablet 11  . omeprazole (PRILOSEC) 20 MG capsule Take 20 mg by mouth daily.    . Potassium 75 MG TABS Take 1 tablet by mouth daily.    . potassium chloride (KLOR-CON 10) 10 MEQ tablet Take 1 tablet (10 mEq total) by mouth daily. 30 tablet 5  . sennosides-docusate sodium (SENOKOT-S) 8.6-50 MG tablet Take 2 tablets by mouth daily.     No current facility-administered medications for this visit.    Allergies  Codeine sulfate; Darvon; Propoxyphene napsylate; Sulfacetamide sodium-sulfur; Sulfamethoxazole-trimethoprim; and  Zolpidem  Electrocardiogram:   10/23/12  SR rate 75 nonspecific ST changes incorrectly read by computer as accelerated junctional  8/15  SR rate 73 normal   Assessment and Plan

## 2014-03-09 ENCOUNTER — Encounter: Payer: Self-pay | Admitting: Cardiovascular Disease

## 2014-03-09 ENCOUNTER — Ambulatory Visit: Payer: Self-pay | Admitting: Cardiovascular Disease

## 2014-03-09 ENCOUNTER — Ambulatory Visit (INDEPENDENT_AMBULATORY_CARE_PROVIDER_SITE_OTHER): Payer: Medicare Other | Admitting: Cardiovascular Disease

## 2014-03-09 VITALS — BP 124/68 | HR 81 | Ht 63.0 in | Wt 180.8 lb

## 2014-03-09 DIAGNOSIS — I10 Essential (primary) hypertension: Secondary | ICD-10-CM | POA: Diagnosis not present

## 2014-03-09 DIAGNOSIS — I6529 Occlusion and stenosis of unspecified carotid artery: Secondary | ICD-10-CM | POA: Diagnosis not present

## 2014-03-09 DIAGNOSIS — R0989 Other specified symptoms and signs involving the circulatory and respiratory systems: Secondary | ICD-10-CM

## 2014-03-09 DIAGNOSIS — I2510 Atherosclerotic heart disease of native coronary artery without angina pectoris: Secondary | ICD-10-CM | POA: Diagnosis not present

## 2014-03-09 MED ORDER — FUROSEMIDE 20 MG PO TABS: 20.0000 mg | ORAL_TABLET | Freq: Every day | ORAL | Status: DC

## 2014-03-09 NOTE — Assessment & Plan Note (Signed)
F/U duplex in June no TIA symptoms ASA

## 2014-03-09 NOTE — Assessment & Plan Note (Signed)
Stable with no angina and good activity level.  Continue medical Rx  

## 2014-03-09 NOTE — Assessment & Plan Note (Signed)
Well controlled.  Continue current medications and low sodium Dash type diet.   Lasix refilled f/u BMET with primary next month Dr Laurian Brim

## 2014-03-09 NOTE — Patient Instructions (Addendum)
Your physician wants you to follow-up in:   [name] [name] will receive a reminder letter in the mail two months in advance. If you don't receive a letter, please call our office to schedule the follow-up appointment. Your physician recommends that you continue on your current medications as directed. Please refer to the Current Medication list given to you today. Your physician has requested that you have a carotid duplex. This test is an ultrasound of the carotid arteries in your neck. It looks at blood flow through these arteries that supply the brain with blood. Allow one hour for this exam. There are no restrictions or special instructions. [place]

## 2014-03-10 ENCOUNTER — Ambulatory Visit (HOSPITAL_BASED_OUTPATIENT_CLINIC_OR_DEPARTMENT_OTHER): Payer: Medicare Other

## 2014-03-10 ENCOUNTER — Other Ambulatory Visit (HOSPITAL_BASED_OUTPATIENT_CLINIC_OR_DEPARTMENT_OTHER): Payer: Medicare Other

## 2014-03-10 DIAGNOSIS — N189 Chronic kidney disease, unspecified: Secondary | ICD-10-CM

## 2014-03-10 DIAGNOSIS — D631 Anemia in chronic kidney disease: Secondary | ICD-10-CM | POA: Diagnosis not present

## 2014-03-10 LAB — CBC WITH DIFFERENTIAL/PLATELET
BASO%: 1.1 % (ref 0.0–2.0)
Basophils Absolute: 0.1 10*3/uL (ref 0.0–0.1)
EOS ABS: 0.2 10*3/uL (ref 0.0–0.5)
EOS%: 3.1 % (ref 0.0–7.0)
HCT: 31.7 % — ABNORMAL LOW (ref 34.8–46.6)
HGB: 10.3 g/dL — ABNORMAL LOW (ref 11.6–15.9)
LYMPH#: 2.2 10*3/uL (ref 0.9–3.3)
LYMPH%: 30.2 % (ref 14.0–49.7)
MCH: 30 pg (ref 25.1–34.0)
MCHC: 32.4 g/dL (ref 31.5–36.0)
MCV: 92.7 fL (ref 79.5–101.0)
MONO#: 0.6 10*3/uL (ref 0.1–0.9)
MONO%: 7.9 % (ref 0.0–14.0)
NEUT%: 57.7 % (ref 38.4–76.8)
NEUTROS ABS: 4.1 10*3/uL (ref 1.5–6.5)
Platelets: 324 10*3/uL (ref 145–400)
RBC: 3.42 10*6/uL — AB (ref 3.70–5.45)
RDW: 13.7 % (ref 11.2–14.5)
WBC: 7.1 10*3/uL (ref 3.9–10.3)

## 2014-03-10 MED ORDER — DARBEPOETIN ALFA 200 MCG/0.4ML IJ SOSY
200.0000 ug | PREFILLED_SYRINGE | Freq: Once | INTRAMUSCULAR | Status: AC
  Administered 2014-03-10: 200 ug via SUBCUTANEOUS
  Filled 2014-03-10: qty 0.4

## 2014-03-10 MED ORDER — DARBEPOETIN ALFA-POLYSORBATE 500 MCG/ML IJ SOLN: 200.0000 ug | Freq: Once | INTRAMUSCULAR | Status: DC

## 2014-03-28 ENCOUNTER — Ambulatory Visit: Payer: Self-pay | Admitting: Internal Medicine

## 2014-04-01 ENCOUNTER — Encounter: Payer: Self-pay | Admitting: Internal Medicine

## 2014-04-01 ENCOUNTER — Ambulatory Visit (INDEPENDENT_AMBULATORY_CARE_PROVIDER_SITE_OTHER): Payer: Medicare Other | Admitting: Internal Medicine

## 2014-04-01 VITALS — BP 160/88 | HR 79 | Wt 182.0 lb

## 2014-04-01 DIAGNOSIS — F411 Generalized anxiety disorder: Secondary | ICD-10-CM | POA: Diagnosis not present

## 2014-04-01 DIAGNOSIS — I214 Non-ST elevation (NSTEMI) myocardial infarction: Secondary | ICD-10-CM | POA: Diagnosis not present

## 2014-04-01 DIAGNOSIS — N259 Disorder resulting from impaired renal tubular function, unspecified: Secondary | ICD-10-CM | POA: Diagnosis not present

## 2014-04-01 DIAGNOSIS — I10 Essential (primary) hypertension: Secondary | ICD-10-CM

## 2014-04-01 DIAGNOSIS — I6529 Occlusion and stenosis of unspecified carotid artery: Secondary | ICD-10-CM | POA: Diagnosis not present

## 2014-04-01 DIAGNOSIS — E785 Hyperlipidemia, unspecified: Secondary | ICD-10-CM | POA: Diagnosis not present

## 2014-04-01 DIAGNOSIS — I5020 Unspecified systolic (congestive) heart failure: Secondary | ICD-10-CM | POA: Diagnosis not present

## 2014-04-01 DIAGNOSIS — I2510 Atherosclerotic heart disease of native coronary artery without angina pectoris: Secondary | ICD-10-CM

## 2014-04-01 MED ORDER — LORAZEPAM 0.5 MG PO TABS: 0.5000 mg | ORAL_TABLET | Freq: Four times a day (QID) | ORAL | Status: DC | PRN

## 2014-04-01 NOTE — Assessment & Plan Note (Signed)
On Furosemide 

## 2014-04-01 NOTE — Assessment & Plan Note (Signed)
Chronic Lorazepam prn  Potential benefits of a long term benzodiazepines  use as well as potential risks  and complications were explained to the patient and were aknowledged. 

## 2014-04-01 NOTE — Progress Notes (Signed)
Pre visit review using our clinic review tool, if applicable. No additional management support is needed unless otherwise documented below in the visit note. 

## 2014-04-01 NOTE — Progress Notes (Signed)
Subjective:     Hypertension This is a chronic problem. Pertinent negatives include no headaches.   F/u hair loss x 2 month - better F/u L leg would give way- weak L knee - better C/o abn renal test - as per med study at [facility]   F/u pain in R thigh - better  Had an MI/CABG in Feb 2015 Hospitalized at [facility] for syncope in March Subsequently had chest pain and cath done Had CABG with Dr Clementeen Graham  SVG d RCA, SVG OM1, distal circumflex and LIMA LAD  Had brief periop afib Rx with amidarone and left hospital in [place] Back home from [place] 2 wks ago. In PT at home now 5/15). The patient presents for a follow-up of  chronic hypertension, chronic dyslipidemia, anemia, CRI, vit D def  Wt Readings from Last 3 Encounters:  04/01/14 182 lb (82.555 kg)  03/09/14 180 lb 12.8 oz (82.01 kg)  02/21/14 180 lb (81.647 kg)   BP Readings from Last 3 Encounters:  04/01/14 160/88  03/10/14 163/74  03/09/14 124/68     Review of Systems  Constitutional: Negative for chills, activity change, appetite change, fatigue and unexpected weight change.  HENT: Negative for congestion, mouth sores and sinus pressure.   Eyes: Negative for visual disturbance.  Respiratory: Negative for cough and chest tightness.   Cardiovascular: Positive for leg swelling.  Gastrointestinal: Negative for nausea and abdominal pain.  Genitourinary: Negative for frequency, difficulty urinating and vaginal pain.  Musculoskeletal: Negative for back pain and gait problem.       Cramps  Skin: Negative for pallor and rash.  Neurological: Negative for dizziness, tremors, weakness and headaches.  Psychiatric/Behavioral: Positive for sleep disturbance. Negative for suicidal ideas and confusion. The patient is nervous/anxious.        Objective:   Physical Exam  Constitutional: She appears well-developed. No distress.  HENT:  Head: Normocephalic.  Right Ear: External ear normal.  Left Ear: External ear normal.  Nose:  Nose normal.  Mouth/Throat: Oropharynx is clear and moist.  Eyes: Conjunctivae are normal. Pupils are equal, round, and reactive to light. Right eye exhibits no discharge. Left eye exhibits no discharge.  Neck: Normal range of motion. Neck supple. No JVD present. No tracheal deviation present. No thyromegaly present.  Cardiovascular: Normal rate, regular rhythm and normal heart sounds.   Pulmonary/Chest: No stridor. No respiratory distress. She has no wheezes.  Abdominal: Soft. Bowel sounds are normal. She exhibits no distension and no mass. There is no tenderness. There is no rebound and no guarding.  Musculoskeletal: She exhibits no edema or tenderness.  Lymphadenopathy:    She has no cervical adenopathy.  Neurological: She displays normal reflexes. No cranial nerve deficit. She exhibits normal muscle tone. Coordination normal.  Skin: No rash noted. No erythema.  Psychiatric: She has a normal mood and affect. Her behavior is normal. Judgment and thought content normal.  Walker L knee is in a brace - better L hip w/nl ROM Knee extensors on L knee 5-/5    Lab Results  Component Value Date   WBC 7.1 03/10/2014   HGB 10.3* 03/10/2014   HCT 31.7* 03/10/2014   PLT 324 03/10/2014   GLUCOSE 101* 12/27/2013   CHOL 92 04/15/2013   TRIG 62 04/15/2013   HDL 40 04/15/2013   LDLCALC 30 04/15/2013   ALT 20 11/15/2013   AST 28 11/15/2013   NA 138 12/27/2013   K 3.4* 12/27/2013   CL 103 12/27/2013   CREATININE  2.1* 12/27/2013   BUN 39* 12/27/2013   CO2 26 12/27/2013   TSH 5.80* 12/27/2013   INR 1.1 09/08/2008        Assessment & Plan:

## 2014-04-01 NOTE — Assessment & Plan Note (Signed)
St 3 CRI  Last Creat 2.39 on 03/28/14 at [facility] Results  Component Value Date   CREATININE 2.1* 12/27/2013   Discussed

## 2014-04-01 NOTE — Assessment & Plan Note (Signed)
No CP On ASA

## 2014-04-01 NOTE — Assessment & Plan Note (Signed)
  On Lipitor 40 mg/d Labs

## 2014-04-01 NOTE — Assessment & Plan Note (Signed)
Lopressor, ASA, Lipitor

## 2014-04-01 NOTE — Assessment & Plan Note (Addendum)
  BP Readings from Last 3 Encounters:  04/01/14 160/88  03/10/14 163/74  03/09/14 124/68   On Furosemide, Lopressor

## 2014-04-18 ENCOUNTER — Other Ambulatory Visit: Payer: Self-pay | Admitting: Internal Medicine

## 2014-04-18 ENCOUNTER — Ambulatory Visit (INDEPENDENT_AMBULATORY_CARE_PROVIDER_SITE_OTHER): Payer: Medicare Other | Admitting: Internal Medicine

## 2014-04-18 ENCOUNTER — Encounter: Payer: Self-pay | Admitting: Internal Medicine

## 2014-04-18 ENCOUNTER — Other Ambulatory Visit (INDEPENDENT_AMBULATORY_CARE_PROVIDER_SITE_OTHER): Payer: Medicare Other

## 2014-04-18 ENCOUNTER — Ambulatory Visit (INDEPENDENT_AMBULATORY_CARE_PROVIDER_SITE_OTHER)
Admission: RE | Admit: 2014-04-18 | Discharge: 2014-04-18 | Disposition: A | Discharge status: Home / Self Care | Payer: Medicare Other | Source: Ambulatory Visit | Attending: Internal Medicine | Admitting: Internal Medicine

## 2014-04-18 VITALS — BP 122/68 | HR 96 | Temp 98.4°F | Ht 63.0 in | Wt 182.0 lb

## 2014-04-18 DIAGNOSIS — M79674 Pain in right toe(s): Secondary | ICD-10-CM

## 2014-04-18 DIAGNOSIS — I6529 Occlusion and stenosis of unspecified carotid artery: Secondary | ICD-10-CM | POA: Diagnosis not present

## 2014-04-18 LAB — CBC WITH DIFFERENTIAL/PLATELET
BASOS PCT: 0.2 % (ref 0.0–3.0)
Basophils Absolute: 0 10*3/uL (ref 0.0–0.1)
EOS PCT: 1.2 % (ref 0.0–5.0)
Eosinophils Absolute: 0.1 10*3/uL (ref 0.0–0.7)
HEMATOCRIT: 33.3 % — AB (ref 36.0–46.0)
HEMOGLOBIN: 11.2 g/dL — AB (ref 12.0–15.0)
Lymphocytes Relative: 18.3 % (ref 12.0–46.0)
Lymphs Abs: 2.1 10*3/uL (ref 0.7–4.0)
MCHC: 33.7 g/dL (ref 30.0–36.0)
MCV: 90.2 fl (ref 78.0–100.0)
MONOS PCT: 7.5 % (ref 3.0–12.0)
Monocytes Absolute: 0.9 10*3/uL (ref 0.1–1.0)
NEUTROS PCT: 72.8 % (ref 43.0–77.0)
Neutro Abs: 8.3 10*3/uL — ABNORMAL HIGH (ref 1.4–7.7)
PLATELETS: 385 10*3/uL (ref 150.0–400.0)
RBC: 3.69 Mil/uL — AB (ref 3.87–5.11)
RDW: 14.1 % (ref 11.5–15.5)
WBC: 11.4 10*3/uL — AB (ref 4.0–10.5)

## 2014-04-18 LAB — URIC ACID: Uric Acid, Serum: 10.6 mg/dL — ABNORMAL HIGH (ref 2.4–7.0)

## 2014-04-18 MED ORDER — PREDNISONE 10 MG PO TABS: ORAL_TABLET | ORAL | Status: DC

## 2014-04-18 NOTE — Patient Instructions (Signed)
  Your next office appointment will be determined based upon review of your pending labs & xrays  Those instructions will be transmitted to you by mail for your records.  Critical results will be called.   Followup as needed for any active or acute issue. Please report any significant change in your symptoms.

## 2014-04-18 NOTE — Progress Notes (Signed)
Pre visit review using our clinic review tool, if applicable. No additional management support is needed unless otherwise documented below in the visit note. 

## 2014-04-18 NOTE — Progress Notes (Signed)
   Subjective:    Patient ID: [name] [name], female    DOB: 01-20-1932, 81 y.o.   MRN: [id number]  HPI Her symptoms began 04/17/14 as pain at the base of the right great toe. There was no trigger or injury. It is described as sharp up to level X and constant. It is worse standing up or ambulating. Last night she was unable to even have the sheets over the toe. There has been redness and swelling which has improved with elevation of the foot. She does note some tingling in the same area.  She has no past history of gout.She is not on HCTZ.   Review of Systems     She denies joint stiffness. She has no associated fever, chills, or sweats. There are no associated myalgias or weakness. There has been no numbness in this area. She has no associated neurologic symptoms such as loss of control of bladder or bowel. She denies any abnormal bruising or bleeding although she has had some posttraumatic bruising of the right upper extremity.    Objective:   Physical Exam  Pertinent positive findings include: There is marked erythema and tenderness at the base of the right great toe.  There is faint erythema over the dorsum of the foot and tenderness to palpation in this area as well.  Pedal pulses are excellent. There are no ischemic skin changes. She has an S4 with slurring. Abdomen is slightly protuberant.  General appearance :adequately nourished; in no distress. Eyes: No conjunctival inflammation or scleral icterus is present. Heart:  Normal rate and regular rhythm. S1 and S2 normal without gallop, murmur, click, or rub . Lungs:Chest clear to auscultation; no wheezes, rhonchi,rales ,or rubs present.No increased work of breathing.  Abdomen: Protuberant;bowel sounds normal, soft and non-tender without masses, organomegaly or hernias noted.   Vascular : all pulses equal ; no bruits present. Skin:Warm & dry.  Intact without suspicious lesions or rashes ; no tenting or jaundice  Lymphatic: No  lymphadenopathy is noted about the head, neck, axilla Neuro: Strength, tone normal.        Assessment & Plan:  #1 pain, redness, and swelling of the base of the right great toe; rule out gout. Less likely would be osteomyelitis or fracture of the small bones of the foot.  Plan: See orders and recommendations

## 2014-04-19 DIAGNOSIS — M2011 Hallux valgus (acquired), right foot: Secondary | ICD-10-CM | POA: Diagnosis not present

## 2014-05-02 ENCOUNTER — Observation Stay (HOSPITAL_COMMUNITY)
Admission: EM | Admit: 2014-05-02 | Discharge: 2014-05-05 | Disposition: A | Discharge status: Home / Self Care | Payer: Medicare Other | Attending: Internal Medicine | Admitting: Internal Medicine

## 2014-05-02 ENCOUNTER — Telehealth: Payer: Self-pay | Admitting: Hematology

## 2014-05-02 ENCOUNTER — Ambulatory Visit: Payer: Self-pay | Admitting: Hematology

## 2014-05-02 ENCOUNTER — Observation Stay (HOSPITAL_COMMUNITY): Payer: Medicare Other

## 2014-05-02 ENCOUNTER — Other Ambulatory Visit: Payer: Self-pay

## 2014-05-02 ENCOUNTER — Ambulatory Visit: Payer: Self-pay

## 2014-05-02 ENCOUNTER — Encounter (HOSPITAL_COMMUNITY): Payer: Self-pay | Admitting: Emergency Medicine

## 2014-05-02 DIAGNOSIS — E039 Hypothyroidism, unspecified: Secondary | ICD-10-CM | POA: Diagnosis not present

## 2014-05-02 DIAGNOSIS — D631 Anemia in chronic kidney disease: Secondary | ICD-10-CM | POA: Diagnosis not present

## 2014-05-02 DIAGNOSIS — K5900 Constipation, unspecified: Secondary | ICD-10-CM | POA: Diagnosis not present

## 2014-05-02 DIAGNOSIS — R030 Elevated blood-pressure reading, without diagnosis of hypertension: Secondary | ICD-10-CM | POA: Diagnosis not present

## 2014-05-02 DIAGNOSIS — M5442 Lumbago with sciatica, left side: Secondary | ICD-10-CM

## 2014-05-02 DIAGNOSIS — M545 Low back pain, unspecified: Secondary | ICD-10-CM

## 2014-05-02 DIAGNOSIS — K219 Gastro-esophageal reflux disease without esophagitis: Secondary | ICD-10-CM | POA: Insufficient documentation

## 2014-05-02 DIAGNOSIS — I129 Hypertensive chronic kidney disease with stage 1 through stage 4 chronic kidney disease, or unspecified chronic kidney disease: Secondary | ICD-10-CM | POA: Diagnosis not present

## 2014-05-02 DIAGNOSIS — I2510 Atherosclerotic heart disease of native coronary artery without angina pectoris: Secondary | ICD-10-CM | POA: Insufficient documentation

## 2014-05-02 DIAGNOSIS — F419 Anxiety disorder, unspecified: Secondary | ICD-10-CM | POA: Insufficient documentation

## 2014-05-02 DIAGNOSIS — E559 Vitamin D deficiency, unspecified: Secondary | ICD-10-CM | POA: Diagnosis not present

## 2014-05-02 DIAGNOSIS — M4316 Spondylolisthesis, lumbar region: Secondary | ICD-10-CM | POA: Diagnosis not present

## 2014-05-02 DIAGNOSIS — Z882 Allergy status to sulfonamides status: Secondary | ICD-10-CM | POA: Diagnosis not present

## 2014-05-02 DIAGNOSIS — N184 Chronic kidney disease, stage 4 (severe): Secondary | ICD-10-CM | POA: Diagnosis not present

## 2014-05-02 DIAGNOSIS — R5383 Other fatigue: Secondary | ICD-10-CM | POA: Diagnosis not present

## 2014-05-02 DIAGNOSIS — Z888 Allergy status to other drugs, medicaments and biological substances status: Secondary | ICD-10-CM | POA: Diagnosis not present

## 2014-05-02 DIAGNOSIS — S22080A Wedge compression fracture of T11-T12 vertebra, initial encounter for closed fracture: Secondary | ICD-10-CM | POA: Diagnosis not present

## 2014-05-02 DIAGNOSIS — M5432 Sciatica, left side: Secondary | ICD-10-CM | POA: Diagnosis not present

## 2014-05-02 DIAGNOSIS — Z6833 Body mass index (BMI) 33.0-33.9, adult: Secondary | ICD-10-CM | POA: Diagnosis not present

## 2014-05-02 DIAGNOSIS — Z7409 Other reduced mobility: Secondary | ICD-10-CM | POA: Diagnosis present

## 2014-05-02 DIAGNOSIS — M549 Dorsalgia, unspecified: Secondary | ICD-10-CM | POA: Diagnosis not present

## 2014-05-02 DIAGNOSIS — R112 Nausea with vomiting, unspecified: Secondary | ICD-10-CM | POA: Diagnosis not present

## 2014-05-02 DIAGNOSIS — F329 Major depressive disorder, single episode, unspecified: Secondary | ICD-10-CM | POA: Insufficient documentation

## 2014-05-02 DIAGNOSIS — Z885 Allergy status to narcotic agent status: Secondary | ICD-10-CM | POA: Insufficient documentation

## 2014-05-02 DIAGNOSIS — M47896 Other spondylosis, lumbar region: Secondary | ICD-10-CM | POA: Diagnosis not present

## 2014-05-02 DIAGNOSIS — E785 Hyperlipidemia, unspecified: Secondary | ICD-10-CM | POA: Diagnosis not present

## 2014-05-02 DIAGNOSIS — E669 Obesity, unspecified: Secondary | ICD-10-CM | POA: Diagnosis not present

## 2014-05-02 DIAGNOSIS — J45909 Unspecified asthma, uncomplicated: Secondary | ICD-10-CM | POA: Insufficient documentation

## 2014-05-02 DIAGNOSIS — I10 Essential (primary) hypertension: Secondary | ICD-10-CM | POA: Diagnosis present

## 2014-05-02 DIAGNOSIS — R262 Difficulty in walking, not elsewhere classified: Secondary | ICD-10-CM | POA: Diagnosis not present

## 2014-05-02 DIAGNOSIS — S22080G Wedge compression fracture of T11-T12 vertebra, subsequent encounter for fracture with delayed healing: Secondary | ICD-10-CM

## 2014-05-02 DIAGNOSIS — I252 Old myocardial infarction: Secondary | ICD-10-CM | POA: Insufficient documentation

## 2014-05-02 DIAGNOSIS — N189 Chronic kidney disease, unspecified: Secondary | ICD-10-CM | POA: Diagnosis not present

## 2014-05-02 DIAGNOSIS — M2578 Osteophyte, vertebrae: Secondary | ICD-10-CM | POA: Diagnosis not present

## 2014-05-02 DIAGNOSIS — Z789 Other specified health status: Secondary | ICD-10-CM | POA: Insufficient documentation

## 2014-05-02 DIAGNOSIS — M4854XA Collapsed vertebra, not elsewhere classified, thoracic region, initial encounter for fracture: Secondary | ICD-10-CM | POA: Diagnosis not present

## 2014-05-02 LAB — URINALYSIS, ROUTINE W REFLEX MICROSCOPIC
Bilirubin Urine: NEGATIVE
Glucose, UA: NEGATIVE mg/dL
Hgb urine dipstick: NEGATIVE
KETONES UR: NEGATIVE mg/dL
NITRITE: NEGATIVE
PROTEIN: NEGATIVE mg/dL
Specific Gravity, Urine: 1.013 (ref 1.005–1.030)
Urobilinogen, UA: 0.2 mg/dL (ref 0.0–1.0)
pH: 5.5 (ref 5.0–8.0)

## 2014-05-02 LAB — BASIC METABOLIC PANEL
Anion gap: 10 (ref 5–15)
BUN: 45 mg/dL — ABNORMAL HIGH (ref 6–23)
CALCIUM: 9.1 mg/dL (ref 8.4–10.5)
CO2: 26 mmol/L (ref 19–32)
Chloride: 103 mmol/L (ref 96–112)
Creatinine, Ser: 2.22 mg/dL — ABNORMAL HIGH (ref 0.50–1.10)
GFR calc Af Amer: 23 mL/min — ABNORMAL LOW (ref >90)
GFR calc non Af Amer: 20 mL/min — ABNORMAL LOW (ref >90)
Glucose, Bld: 130 mg/dL — ABNORMAL HIGH (ref 70–99)
POTASSIUM: 3.8 mmol/L (ref 3.5–5.1)
Sodium: 139 mmol/L (ref 135–145)

## 2014-05-02 LAB — CBC WITH DIFFERENTIAL/PLATELET
Basophils Absolute: 0 10*3/uL (ref 0.0–0.1)
Basophils Relative: 0 % (ref 0–1)
Eosinophils Absolute: 0.2 10*3/uL (ref 0.0–0.7)
Eosinophils Relative: 2 % (ref 0–5)
HCT: 35.9 % — ABNORMAL LOW (ref 36.0–46.0)
Hemoglobin: 12 g/dL (ref 12.0–15.0)
LYMPHS ABS: 1.8 10*3/uL (ref 0.7–4.0)
LYMPHS PCT: 15 % (ref 12–46)
MCH: 31 pg (ref 26.0–34.0)
MCHC: 33.4 g/dL (ref 30.0–36.0)
MCV: 92.8 fL (ref 78.0–100.0)
Monocytes Absolute: 0.7 10*3/uL (ref 0.1–1.0)
Monocytes Relative: 6 % (ref 3–12)
NEUTROS ABS: 9.2 10*3/uL — AB (ref 1.7–7.7)
NEUTROS PCT: 77 % (ref 43–77)
Platelets: 392 10*3/uL (ref 150–400)
RBC: 3.87 MIL/uL (ref 3.87–5.11)
RDW: 13.8 % (ref 11.5–15.5)
WBC: 11.9 10*3/uL — AB (ref 4.0–10.5)

## 2014-05-02 LAB — URINE MICROSCOPIC-ADD ON

## 2014-05-02 LAB — TSH: TSH: 2.214 u[IU]/mL (ref 0.350–4.500)

## 2014-05-02 LAB — T4, FREE: FREE T4: 1.44 ng/dL (ref 0.80–1.80)

## 2014-05-02 MED ORDER — ONDANSETRON HCL 4 MG PO TABS: 4.0000 mg | ORAL_TABLET | Freq: Four times a day (QID) | ORAL | Status: DC | PRN

## 2014-05-02 MED ORDER — HYDROMORPHONE HCL 1 MG/ML IJ SOLN
0.5000 mg | INTRAMUSCULAR | Status: DC | PRN
  Administered 2014-05-02 – 2014-05-04 (×2): 0.5 mg via INTRAVENOUS
  Filled 2014-05-02 (×2): qty 1

## 2014-05-02 MED ORDER — ASPIRIN EC 325 MG PO TBEC: 325.0000 mg | DELAYED_RELEASE_TABLET | Freq: Every day | ORAL | Status: DC

## 2014-05-02 MED ORDER — HYDROMORPHONE HCL 1 MG/ML IJ SOLN
0.5000 mg | Freq: Once | INTRAMUSCULAR | Status: AC
  Administered 2014-05-02: 0.5 mg via INTRAVENOUS
  Filled 2014-05-02: qty 1

## 2014-05-02 MED ORDER — SENNOSIDES-DOCUSATE SODIUM 8.6-50 MG PO TABS
1.0000 | ORAL_TABLET | Freq: Every day | ORAL | Status: DC
  Administered 2014-05-02 – 2014-05-05 (×4): 1 via ORAL
  Filled 2014-05-02 (×4): qty 1

## 2014-05-02 MED ORDER — SENNA-DOCUSATE SODIUM 8.6-50 MG PO TABS: 1.0000 | ORAL_TABLET | Freq: Every day | ORAL | Status: DC

## 2014-05-02 MED ORDER — HYDRALAZINE HCL 20 MG/ML IJ SOLN: 5.0000 mg | Freq: Four times a day (QID) | INTRAMUSCULAR | Status: DC | PRN

## 2014-05-02 MED ORDER — HEPARIN SODIUM (PORCINE) 5000 UNIT/ML IJ SOLN
5000.0000 [IU] | Freq: Three times a day (TID) | INTRAMUSCULAR | Status: AC
Start: 2014-05-02 — End: 2014-05-04
  Administered 2014-05-02 – 2014-05-04 (×7): 5000 [IU] via SUBCUTANEOUS
  Filled 2014-05-02 (×9): qty 1

## 2014-05-02 MED ORDER — ONDANSETRON HCL 4 MG/2ML IJ SOLN
4.0000 mg | Freq: Four times a day (QID) | INTRAMUSCULAR | Status: DC | PRN
  Administered 2014-05-02: 4 mg via INTRAVENOUS
  Filled 2014-05-02: qty 2

## 2014-05-02 MED ORDER — FUROSEMIDE 20 MG PO TABS
20.0000 mg | ORAL_TABLET | Freq: Every day | ORAL | Status: DC
  Administered 2014-05-03 – 2014-05-04 (×2): 20 mg via ORAL
  Filled 2014-05-02 (×3): qty 1

## 2014-05-02 MED ORDER — METOPROLOL TARTRATE 12.5 MG HALF TABLET
12.5000 mg | ORAL_TABLET | Freq: Two times a day (BID) | ORAL | Status: DC
  Administered 2014-05-02 – 2014-05-05 (×7): 12.5 mg via ORAL
  Filled 2014-05-02 (×9): qty 1

## 2014-05-02 MED ORDER — SODIUM CHLORIDE 0.9 % IV BOLUS (SEPSIS)
500.0000 mL | Freq: Once | INTRAVENOUS | Status: AC
  Administered 2014-05-02: 500 mL via INTRAVENOUS

## 2014-05-02 MED ORDER — ACETAMINOPHEN 325 MG PO TABS
650.0000 mg | ORAL_TABLET | Freq: Once | ORAL | Status: AC
  Administered 2014-05-02: 650 mg via ORAL
  Filled 2014-05-02: qty 2

## 2014-05-02 MED ORDER — LEVOTHYROXINE SODIUM 50 MCG PO TABS
50.0000 ug | ORAL_TABLET | Freq: Every day | ORAL | Status: DC
  Administered 2014-05-03 – 2014-05-04 (×2): 50 ug via ORAL
  Filled 2014-05-02 (×4): qty 1

## 2014-05-02 MED ORDER — DARBEPOETIN ALFA 100 MCG/0.5ML IJ SOSY
100.0000 ug | PREFILLED_SYRINGE | Freq: Once | INTRAMUSCULAR | Status: AC
  Administered 2014-05-03: 100 ug via SUBCUTANEOUS
  Filled 2014-05-02 (×2): qty 0.5

## 2014-05-02 MED ORDER — CHOLECALCIFEROL 25 MCG (1000 UT) PO TABS: 1000.0000 [IU] | ORAL_TABLET | Freq: Every day | ORAL | Status: DC

## 2014-05-02 MED ORDER — SODIUM CHLORIDE 0.9 % IV SOLN: 250.0000 mL | INTRAVENOUS | Status: DC | PRN

## 2014-05-02 MED ORDER — ALBUTEROL SULFATE (2.5 MG/3ML) 0.083% IN NEBU: 2.5000 mg | INHALATION_SOLUTION | RESPIRATORY_TRACT | Status: DC | PRN

## 2014-05-02 MED ORDER — ONDANSETRON HCL 4 MG/2ML IJ SOLN: 4.0000 mg | Freq: Three times a day (TID) | INTRAMUSCULAR | Status: DC | PRN

## 2014-05-02 MED ORDER — ATORVASTATIN CALCIUM 40 MG PO TABS
40.0000 mg | ORAL_TABLET | Freq: Every day | ORAL | Status: DC
  Administered 2014-05-02 – 2014-05-05 (×4): 40 mg via ORAL
  Filled 2014-05-02 (×4): qty 1

## 2014-05-02 MED ORDER — ACETAMINOPHEN 325 MG PO TABS: 650.0000 mg | ORAL_TABLET | Freq: Four times a day (QID) | ORAL | Status: DC | PRN

## 2014-05-02 MED ORDER — PANTOPRAZOLE SODIUM 40 MG PO TBEC
40.0000 mg | DELAYED_RELEASE_TABLET | Freq: Every day | ORAL | Status: DC
  Administered 2014-05-02 – 2014-05-05 (×4): 40 mg via ORAL
  Filled 2014-05-02 (×4): qty 1

## 2014-05-02 MED ORDER — SODIUM CHLORIDE 0.9 % IJ SOLN
3.0000 mL | Freq: Two times a day (BID) | INTRAMUSCULAR | Status: DC
  Administered 2014-05-03 – 2014-05-04 (×3): 3 mL via INTRAVENOUS

## 2014-05-02 MED ORDER — SODIUM CHLORIDE 0.9 % IJ SOLN: 3.0000 mL | INTRAMUSCULAR | Status: DC | PRN

## 2014-05-02 MED ORDER — VITAMIN B-1 100 MG PO TABS
100.0000 mg | ORAL_TABLET | Freq: Every day | ORAL | Status: DC
  Administered 2014-05-02 – 2014-05-05 (×4): 100 mg via ORAL
  Filled 2014-05-02 (×5): qty 1

## 2014-05-02 MED ORDER — MIRTAZAPINE 15 MG PO TABS
15.0000 mg | ORAL_TABLET | Freq: Every evening | ORAL | Status: DC | PRN
  Administered 2014-05-02 – 2014-05-04 (×3): 15 mg via ORAL
  Filled 2014-05-02 (×5): qty 1

## 2014-05-02 MED ORDER — ASPIRIN EC 325 MG PO TBEC
325.0000 mg | DELAYED_RELEASE_TABLET | Freq: Every day | ORAL | Status: DC
  Administered 2014-05-02 – 2014-05-05 (×4): 325 mg via ORAL
  Filled 2014-05-02 (×4): qty 1

## 2014-05-02 MED ORDER — DARBEPOETIN ALFA-POLYSORBATE 100 MCG/0.5ML IJ SOLN: 100.0000 ug | Freq: Once | INTRAMUSCULAR | Status: DC

## 2014-05-02 MED ORDER — ACETAMINOPHEN 650 MG RE SUPP: 650.0000 mg | Freq: Four times a day (QID) | RECTAL | Status: DC | PRN

## 2014-05-02 MED ORDER — POLYETHYLENE GLYCOL 3350 17 G PO PACK
17.0000 g | PACK | Freq: Every day | ORAL | Status: DC
  Administered 2014-05-02 – 2014-05-05 (×4): 17 g via ORAL
  Filled 2014-05-02 (×4): qty 1

## 2014-05-02 MED ORDER — SODIUM CHLORIDE 0.9 % IV SOLN
INTRAVENOUS | Status: AC
  Administered 2014-05-02: 11:00:00 via INTRAVENOUS

## 2014-05-02 MED ORDER — VITAMIN D3 25 MCG (1000 UNIT) PO TABS
1000.0000 [IU] | ORAL_TABLET | Freq: Every day | ORAL | Status: DC
  Administered 2014-05-02 – 2014-05-05 (×4): 1000 [IU] via ORAL
  Filled 2014-05-02 (×4): qty 1

## 2014-05-02 MED ORDER — ONDANSETRON HCL 4 MG/2ML IJ SOLN
4.0000 mg | Freq: Once | INTRAMUSCULAR | Status: AC
  Administered 2014-05-02: 4 mg via INTRAVENOUS
  Filled 2014-05-02: qty 2

## 2014-05-02 MED ORDER — POTASSIUM CHLORIDE ER 10 MEQ PO TBCR
10.0000 meq | EXTENDED_RELEASE_TABLET | Freq: Every day | ORAL | Status: DC
  Administered 2014-05-02 – 2014-05-03 (×2): 10 meq via ORAL
  Filled 2014-05-02 (×3): qty 1

## 2014-05-02 MED ORDER — LORAZEPAM 0.5 MG PO TABS
0.5000 mg | ORAL_TABLET | Freq: Four times a day (QID) | ORAL | Status: DC | PRN
  Administered 2014-05-02 – 2014-05-04 (×5): 0.5 mg via ORAL
  Filled 2014-05-02 (×5): qty 1

## 2014-05-02 MED ORDER — OXYCODONE HCL 5 MG PO TABS
5.0000 mg | ORAL_TABLET | ORAL | Status: DC | PRN
  Administered 2014-05-02 – 2014-05-05 (×4): 5 mg via ORAL
  Filled 2014-05-02 (×4): qty 1

## 2014-05-02 NOTE — ED Notes (Signed)
Pt comes in by EMS for left sided body pain esp in left buttock, hip and flank area that has been going on over for little while but got worse during the weekend.  Pt states that she had a hard time getting up to the bathroom this morning.

## 2014-05-02 NOTE — ED Notes (Signed)
Patient states that she is nauseated and can not get up right now.

## 2014-05-02 NOTE — ED Provider Notes (Signed)
CSN: [id number]     Arrival date & time 05/02/14  0706 History   First MD Initiated Contact with Patient 05/02/14 [phone]     Chief Complaint  Patient presents with  . Hip Pain  . Back Pain     (Consider location/radiation/quality/duration/timing/severity/associated sxs/prior Treatment) HPI Comments: 81 year old female with history of osteoarthritis, diverticulosis, coronary artery disease, high blood pressure, anxiety, vitamin D deficiency, chronic back pain since she was a child due to osteomyelitis is an infant presents with left lower back pain worse since yesterday. This is similar previous however more severe and lasting longer. Patient had minimal sleep last night due to pain. Patient taking Tylenol with mild improvement. Patient denies urinary or bowel changes, active cancer, extremity weakness, fevers, immunosuppression or significant trauma. Minimal radiation of the pain.   Patient is a 81 y.o. female presenting with hip pain and back pain. The history is provided by the patient.  Hip Pain Pertinent negatives include no chest pain, no abdominal pain, no headaches and no shortness of breath.  Back Pain Associated symptoms: no abdominal pain, no chest pain, no dysuria, no fever, no headaches, no numbness and no weakness     Past Medical History  Diagnosis Date  . Unspecified essential hypertension     Dr [name]  . Other and unspecified hyperlipidemia   . Unspecified disorder resulting from impaired renal function   . Diverticulosis of colon with hemorrhage   . Unspecified vitamin D deficiency   . Unspecified asthma(493.90)   . Persistent disorder of initiating or maintaining sleep   . Lumbago   . Esophageal reflux   . Diverticulosis of colon (without mention of hemorrhage)   . Depressive disorder, not elsewhere classified   . Anxiety state, unspecified   . Anemia, unspecified   . Unspecified osteomyelitis, site unspecified   . Myocardial infarction   . Thyroid disease     Past Surgical History  Procedure Laterality Date  . Bone marrow biopsy    . Status post right carotid enterectomy    . Partial colectomy  1995    diverticulosis of colon iwth hemorrhage.   . Coronary artery bypass graft  03/25/13    x4. CABS ON PUMP-Surgeon Jomarie Longs.   Family History  Problem Relation Age of Onset  . Hypertension Other   . COPD Mother   . Cancer Father     lymphoma   History  Substance Use Topics  . Smoking status: Never Smoker   . Smokeless tobacco: Never Used  . Alcohol Use: 0.0 oz/week    1-2 Shots of liquor per week   OB History    No data available     Review of Systems  Constitutional: Positive for fatigue. Negative for fever and chills.  HENT: Negative for congestion.   Eyes: Negative for visual disturbance.  Respiratory: Negative for shortness of breath.   Cardiovascular: Negative for chest pain.  Gastrointestinal: Negative for vomiting and abdominal pain.  Genitourinary: Positive for flank pain. Negative for dysuria, hematuria and difficulty urinating.  Musculoskeletal: Positive for back pain. Negative for neck pain and neck stiffness.  Skin: Negative for rash.  Neurological: Negative for weakness, light-headedness, numbness and headaches.      Allergies  Codeine sulfate; Darvon; Propoxyphene napsylate; Sulfacetamide sodium-sulfur; Sulfamethoxazole-trimethoprim; and Zolpidem  Home Medications   Prior to Admission medications   Medication Sig Start Date End Date Taking? Authorizing Provider  acetaminophen (TYLENOL) 650 MG CR tablet Take 325 mg by mouth every 8 (eight) hours as  needed for pain (arthritis).    Yes Historical Provider, MD  albuterol (PROAIR HFA) 108 (90 BASE) MCG/ACT inhaler Inhale 2 puffs into the lungs every 4 (four) hours as needed. 01/13/14  Yes Aleksei Plotnikov V, MD  albuterol (PROVENTIL) (5 MG/ML) 0.5% nebulizer solution Take 2.5 mg by nebulization every 6 (six) hours as needed for wheezing or shortness of breath.    Yes Historical Provider, MD  aspirin 325 MG EC tablet Take 325 mg by mouth daily.   Yes Historical Provider, MD  atorvastatin (LIPITOR) 40 MG tablet Take 1 tablet (40 mg total) by mouth daily. 05/19/13  Yes Josue Hector, MD  b complex vitamins capsule Take 1 capsule by mouth daily.   Yes Historical Provider, MD  Biotin 5000 MCG CAPS Take 1 capsule by mouth daily.   Yes Historical Provider, MD  Cholecalciferol (EQL VITAMIN D3) 1000 UNITS tablet Take 1 tablet (1,000 Units total) by mouth daily. 05/28/13  Yes Aleksei Plotnikov V, MD  darbepoetin (ARANESP) 100 MCG/0.5ML SOLN injection Inject 100 mcg into the skin every 8 (eight) weeks.   Yes Historical Provider, MD  furosemide (LASIX) 20 MG tablet Take 1 tablet (20 mg total) by mouth daily. 03/09/14  Yes Josue Hector, MD  levothyroxine (SYNTHROID, LEVOTHROID) 50 MCG tablet Take 1 tablet (50 mcg total) by mouth daily before breakfast. 05/28/13  Yes Aleksei Plotnikov V, MD  LORazepam (ATIVAN) 0.5 MG tablet Take 1 tablet (0.5 mg total) by mouth every 6 (six) hours as needed for anxiety. 04/01/14  Yes Aleksei Plotnikov V, MD  metoprolol tartrate (LOPRESSOR) 25 MG tablet Take 0.5 tablets (12.5 mg total) by mouth 2 (two) times daily. 05/19/13  Yes Josue Hector, MD  mirtazapine (REMERON) 15 MG tablet Take 1 tablet (15 mg total) by mouth at bedtime. 05/28/13  Yes Aleksei Plotnikov V, MD  omeprazole (PRILOSEC) 20 MG capsule Take 20 mg by mouth daily.   Yes Historical Provider, MD  potassium chloride (KLOR-CON 10) 10 MEQ tablet Take 1 tablet (10 mEq total) by mouth daily. 12/28/13  Yes Aleksei Plotnikov V, MD  sennosides-docusate sodium (SENOKOT-S) 8.6-50 MG tablet Take 1 tablet by mouth daily.    Yes Historical Provider, MD  predniSONE (DELTASONE) 10 MG tablet 1 tid with meals 04/18/14   Hendricks Limes, MD   BP 199/92 mmHg  Pulse 89  Temp(Src) 98.3 F (36.8 C) (Oral)  Resp 20  SpO2 98% Physical Exam  Constitutional: She is oriented to person, place, and time.  She appears well-developed and well-nourished.  HENT:  Head: Normocephalic and atraumatic.  Eyes: Right eye exhibits no discharge. Left eye exhibits no discharge.  Neck: Normal range of motion. Neck supple. No tracheal deviation present.  Cardiovascular: Normal rate.   Pulmonary/Chest: Effort normal.  Abdominal: Soft. She exhibits no distension. There is no tenderness. There is no guarding.  Musculoskeletal: She exhibits tenderness. She exhibits no edema.  Patient has tenderness most significant and focal region left upper buttocks/ SI region, no midline tenderness, no rashes.  Neurological: She is alert and oriented to person, place, and time. GCS eye subscore is 4. GCS verbal subscore is 5. GCS motor subscore is 6.  Reflex Scores:      Patellar reflexes are 1+ on the right side and 1+ on the left side.      Achilles reflexes are 1+ on the right side and 1+ on the left side. Patient has 5+ strength lower extremities equal bilateral flexion and extension of hips knees  and great toes. Patient has equal sensation in major nerve distributions lower extremities bilateral.  Skin: Skin is warm. No rash noted.  Psychiatric:  Uncomfortable due to pain, mild anxiety component.  Nursing note and vitals reviewed.   ED Course  Procedures (including critical care time)   Labs Review Labs Reviewed  CBC WITH DIFFERENTIAL/PLATELET - Abnormal; Notable for the following:    WBC 11.9 (*)    HCT 35.9 (*)    Neutro Abs 9.2 (*)    All other components within normal limits  BASIC METABOLIC PANEL - Abnormal; Notable for the following:    Glucose, Bld 130 (*)    BUN 45 (*)    Creatinine, Ser 2.22 (*)    GFR calc non Af Amer 20 (*)    GFR calc Af Amer 23 (*)    All other components within normal limits  URINALYSIS, ROUTINE W REFLEX MICROSCOPIC - Abnormal; Notable for the following:    Leukocytes, UA TRACE (*)    All other components within normal limits  URINE CULTURE  URINE MICROSCOPIC-ADD ON     Imaging Review No results found.   EKG Interpretation None      MDM   Final diagnoses:  Left-sided low back pain without sciatica  Other fatigue   Patient with history of back pain in no red flags presents with worsening left lower back pain. Patient does have mild flank pain associated over no urinary symptoms no fevers. Patient is very concerned about her kidneys she has had recent issues. Discussed screening labs to check kidney function with fatigue will check hemoglobin and history of anemia. Patient does not require any emergent imaging at this time. Pain medicines ordered.  Patient required multiple pain meds in ER. Clinically concern for sciatica. No abdominal pain on exam. Blood work similar to previous with chronic renal failure. I attempted to walk patient unsuccessful patient had significant pain in the left by Dr. and started vomiting. Plan for admission for further evaluation and treatment, page [facility].  The patients results and plan were reviewed and discussed.   Any x-rays performed were personally reviewed by myself.   Differential diagnosis were considered with the presenting HPI.  Medications  sodium chloride 0.9 % bolus 500 mL (not administered)  ondansetron (ZOFRAN) injection 4 mg (not administered)  HYDROmorphone (DILAUDID) injection 0.5 mg (0.5 mg Intravenous Given 05/02/14 0743)  acetaminophen (TYLENOL) tablet 650 mg (650 mg Oral Given 05/02/14 0742)  sodium chloride 0.9 % bolus 500 mL (0 mLs Intravenous Stopped 05/02/14 0814)  HYDROmorphone (DILAUDID) injection 0.5 mg (0.5 mg Intravenous Given 05/02/14 0851)    Filed Vitals:   05/02/14 0713 05/02/14 0741 05/02/14 0752  BP: 199/92    Pulse: 89    Temp:  98.3 F (36.8 C) 98.3 F (36.8 C)  TempSrc:  Oral Oral  Resp: 20    SpO2: 98%      Final diagnoses:  Left-sided low back pain without sciatica  Other fatigue    Admission/ observation were discussed with the admitting physician,  patient and/or family and they are comfortable with the plan.      Elnora Morrison, MD 05/06/14 [phone]

## 2014-05-02 NOTE — H&P (Addendum)
[facility] History and Physical  [name] MPN:[id number] DOB: Apr 02, 1931 DOA: 05/02/2014   PCP: [name], MD  Specialists: Followed by Dr. Leonides Sake with cardiology.  Chief Complaint: Pain in the left lower back down to the left leg  HPI: [name] [name] is a 81 y.o. female with a past medical history of coronary artery disease status post CABG at [facility] in 2015, chronic kidney disease, hypertension, chronic low back pain, anemia secondary to chronic kidney disease who was in her usual state of health till this past Thursday when she started noticing pain in the left lower back. Typically, her chronic back pain is in the central back. She denies any falls or injuries. The pain has been progressively getting worse. She has been unable to ambulate as a result. Movement increases the pain. It seems to be radiating down to her left leg as a sharp shooting pain. Denies any fever. Has been weak overall. Has noticed that she has had to pass urine more frequently and did notice some blood in the urine a few days ago but none today. Denies any history of kidney stones. Hasn't had a bowel movement since Thursday. Because she was unable to ambulate she decided to come into the emergency department.  Home Medications: Prior to Admission medications   Medication Sig Start Date End Date Taking? Authorizing Provider  acetaminophen (TYLENOL) 650 MG CR tablet Take 325 mg by mouth every 8 (eight) hours as needed for pain (arthritis).    Yes Historical Provider, MD  albuterol (PROAIR HFA) 108 (90 BASE) MCG/ACT inhaler Inhale 2 puffs into the lungs every 4 (four) hours as needed. 01/13/14  Yes Aleksei Plotnikov V, MD  albuterol (PROVENTIL) (5 MG/ML) 0.5% nebulizer solution Take 2.5 mg by nebulization every 6 (six) hours as needed for wheezing or shortness of breath.   Yes Historical Provider, MD  aspirin 325 MG EC tablet Take 325 mg by mouth daily.   Yes Historical Provider, MD    atorvastatin (LIPITOR) 40 MG tablet Take 1 tablet (40 mg total) by mouth daily. 05/19/13  Yes Josue Hector, MD  b complex vitamins capsule Take 1 capsule by mouth daily.   Yes Historical Provider, MD  Biotin 5000 MCG CAPS Take 1 capsule by mouth daily.   Yes Historical Provider, MD  Cholecalciferol (EQL VITAMIN D3) 1000 UNITS tablet Take 1 tablet (1,000 Units total) by mouth daily. 05/28/13  Yes Aleksei Plotnikov V, MD  darbepoetin (ARANESP) 100 MCG/0.5ML SOLN injection Inject 100 mcg into the skin every 8 (eight) weeks.   Yes Historical Provider, MD  furosemide (LASIX) 20 MG tablet Take 1 tablet (20 mg total) by mouth daily. 03/09/14  Yes Josue Hector, MD  levothyroxine (SYNTHROID, LEVOTHROID) 50 MCG tablet Take 1 tablet (50 mcg total) by mouth daily before breakfast. 05/28/13  Yes Aleksei Plotnikov V, MD  LORazepam (ATIVAN) 0.5 MG tablet Take 1 tablet (0.5 mg total) by mouth every 6 (six) hours as needed for anxiety. 04/01/14  Yes Aleksei Plotnikov V, MD  metoprolol tartrate (LOPRESSOR) 25 MG tablet Take 0.5 tablets (12.5 mg total) by mouth 2 (two) times daily. 05/19/13  Yes Josue Hector, MD  mirtazapine (REMERON) 15 MG tablet Take 1 tablet (15 mg total) by mouth at bedtime. 05/28/13  Yes Aleksei Plotnikov V, MD  omeprazole (PRILOSEC) 20 MG capsule Take 20 mg by mouth daily.   Yes Historical Provider, MD  potassium chloride (KLOR-CON 10) 10 MEQ tablet Take 1 tablet (  10 mEq total) by mouth daily. 12/28/13  Yes Aleksei Plotnikov V, MD  sennosides-docusate sodium (SENOKOT-S) 8.6-50 MG tablet Take 1 tablet by mouth daily.    Yes Historical Provider, MD  predniSONE (DELTASONE) 10 MG tablet 1 tid with meals 04/18/14   Hendricks Limes, MD    Allergies:  Allergies  Allergen Reactions  . Codeine Sulfate Nausea And Vomiting  . Darvon [Propoxyphene] Nausea And Vomiting    hallucinations  . Propoxyphene Napsylate Nausea And Vomiting  . Sulfacetamide Sodium-Sulfur Nausea And Vomiting  .  Sulfamethoxazole-Trimethoprim Nausea Only    Can't remember reaction  . Zolpidem     Other reaction(s): Mental Status Changes (intolerance)    Past Medical History: Past Medical History  Diagnosis Date  . Unspecified essential hypertension     Dr [name]  . Other and unspecified hyperlipidemia   . Unspecified disorder resulting from impaired renal function   . Diverticulosis of colon with hemorrhage   . Unspecified vitamin D deficiency   . Unspecified asthma(493.90)   . Persistent disorder of initiating or maintaining sleep   . Lumbago   . Esophageal reflux   . Diverticulosis of colon (without mention of hemorrhage)   . Depressive disorder, not elsewhere classified   . Anxiety state, unspecified   . Anemia, unspecified   . Unspecified osteomyelitis, site unspecified   . Myocardial infarction   . Thyroid disease     Past Surgical History  Procedure Laterality Date  . Bone marrow biopsy    . Status post right carotid enterectomy    . Partial colectomy  1995    diverticulosis of colon iwth hemorrhage.   . Coronary artery bypass graft  03/25/13    x4. CABS ON PUMP-Surgeon Jomarie Longs.    Social History: She lives alone. Usually is fairly active. Does her own yard work. Occasionally uses a walker when she has worsening back pain, but not on a regular basis. No smoking. Drinks 1-2 Rum and coke on a daily basis, but hasn't had anything since last Tuesday.    Family History:  Family History  Problem Relation Age of Onset  . Hypertension Other   . COPD Mother   . Cancer Father     lymphoma     Review of Systems - History obtained from the patient General ROS: positive for  - fatigue Psychological ROS: positive for - anxiety Ophthalmic ROS: negative ENT ROS: negative Allergy and Immunology ROS: negative Hematological and Lymphatic ROS: negative Endocrine ROS: negative Respiratory ROS: no cough, shortness of breath, or wheezing Cardiovascular ROS: no chest pain or dyspnea on  exertion Gastrointestinal ROS: no abdominal pain, change in bowel habits, or black or bloody stools Genito-Urinary ROS: as in hpi Musculoskeletal ROS: as in hpi Neurological ROS: no TIA or stroke symptoms Dermatological ROS: negative  Physical Examination  Filed Vitals:   05/02/14 0713 05/02/14 0741 05/02/14 0752 05/02/14 1051  BP: 199/92   127/49  Pulse: 89   91  Temp:  98.3 F (36.8 C) 98.3 F (36.8 C) 98.2 F (36.8 C)  TempSrc:  Oral Oral Oral  Resp: 20   16  SpO2: 98%   98%    BP 127/49 mmHg  Pulse 91  Temp(Src) 98.2 F (36.8 C) (Oral)  Resp 16  SpO2 98%  General appearance: alert, cooperative, appears stated age, no distress and moderately obese Head: Normocephalic, without obvious abnormality, atraumatic Eyes: conjunctivae/corneas clear. PERRL, EOM's intact. Throat: lips, mucosa, and tongue normal; teeth and gums normal Neck:  no adenopathy, no carotid bruit, no JVD, supple, symmetrical, trachea midline and thyroid not enlarged, symmetric, no tenderness/mass/nodules Back: symmetric, no curvature. ROM normal. No CVA tenderness., Some tenderness over the left lateral side in the lumbar area Resp: clear to auscultation bilaterally Cardio: regular rate and rhythm, S1, S2 normal, no murmur, click, rub or gallop GI: soft, minimal discomfort in the left side of the abdomen without any obvious tenderness. No rebound, rigidity or guarding. bowel sounds normal; no masses,  no organomegaly Extremities: Straight leg raising test was positive on the left. At 30 Pulses: 2+ and symmetric Skin: Skin color, texture, turgor normal. No rashes or lesions Lymph nodes: Cervical, supraclavicular, and axillary nodes normal. Neurologic: Awake and alert. Oriented 3. Cranial nerves II-12 intact. Motor strength equal bilateral upper extremity. Limited in the left lower extremity. Normal in the right lower extremity. Gait not assessed.  Laboratory Data: Results for orders placed or performed  during the hospital encounter of 05/02/14 (from the past 48 hour(s))  CBC with Differential/Platelet     Status: Abnormal   Collection Time: 05/02/14  7:39 AM  Result Value Ref Range   WBC 11.9 (H) 4.0 - 10.5 K/uL   RBC 3.87 3.87 - 5.11 MIL/uL   Hemoglobin 12.0 12.0 - 15.0 g/dL   HCT 35.9 (L) 36.0 - 46.0 %   MCV 92.8 78.0 - 100.0 fL   MCH 31.0 26.0 - 34.0 pg   MCHC 33.4 30.0 - 36.0 g/dL   RDW 13.8 11.5 - 15.5 %   Platelets 392 150 - 400 K/uL   Neutrophils Relative % 77 43 - 77 %   Neutro Abs 9.2 (H) 1.7 - 7.7 K/uL   Lymphocytes Relative 15 12 - 46 %   Lymphs Abs 1.8 0.7 - 4.0 K/uL   Monocytes Relative 6 3 - 12 %   Monocytes Absolute 0.7 0.1 - 1.0 K/uL   Eosinophils Relative 2 0 - 5 %   Eosinophils Absolute 0.2 0.0 - 0.7 K/uL   Basophils Relative 0 0 - 1 %   Basophils Absolute 0.0 0.0 - 0.1 K/uL  Basic metabolic panel     Status: Abnormal   Collection Time: 05/02/14  7:39 AM  Result Value Ref Range   Sodium 139 135 - 145 mmol/L   Potassium 3.8 3.5 - 5.1 mmol/L   Chloride 103 96 - 112 mmol/L   CO2 26 19 - 32 mmol/L   Glucose, Bld 130 (H) 70 - 99 mg/dL   BUN 45 (H) 6 - 23 mg/dL   Creatinine, Ser 2.22 (H) 0.50 - 1.10 mg/dL   Calcium 9.1 8.4 - 10.5 mg/dL   GFR calc non Af Amer 20 (L) >90 mL/min   GFR calc Af Amer 23 (L) >90 mL/min    Comment: (NOTE) The eGFR has been calculated using the CKD EPI equation. This calculation has not been validated in all clinical situations. eGFR's persistently <90 mL/min signify possible Chronic Kidney Disease.    Anion gap 10 5 - 15  Urinalysis, Routine w reflex microscopic     Status: Abnormal   Collection Time: 05/02/14  8:42 AM  Result Value Ref Range   Color, Urine YELLOW YELLOW   APPearance CLEAR CLEAR   Specific Gravity, Urine 1.013 1.005 - 1.030   pH 5.5 5.0 - 8.0   Glucose, UA NEGATIVE NEGATIVE mg/dL   Hgb urine dipstick NEGATIVE NEGATIVE   Bilirubin Urine NEGATIVE NEGATIVE   Ketones, ur NEGATIVE NEGATIVE mg/dL   Protein, ur  NEGATIVE NEGATIVE mg/dL   Urobilinogen, UA 0.2 0.0 - 1.0 mg/dL   Nitrite NEGATIVE NEGATIVE   Leukocytes, UA TRACE (A) NEGATIVE  Urine microscopic-add on     Status: None   Collection Time: 05/02/14  8:42 AM  Result Value Ref Range   Squamous Epithelial / LPF RARE RARE   WBC, UA 3-6 <3 WBC/hpf   Bacteria, UA RARE RARE    Radiology Reports: No results found.   Problem List  Principal Problem:   Left-sided low back pain with sciatica Active Problems:   Essential hypertension   Coronary atherosclerosis   Anemia in chronic renal disease   Decreased ambulation status   CKD (chronic kidney disease) stage 4, GFR 15-29 ml/min   Assessment: This is a 81 year old Caucasian female who presents with worsening left lower back pain since Thursday. Symptoms are suggestive of radiculopathy. She did mention blood in the urine once and so nephrolithiasis was considered, however, UA does not show any blood in the urine today. Examination is more suggestive of musculoskeletal etiology. She has other medical issues including chronic kidney disease and coronary artery disease, both of which are stable  Plan: #1 left lower back pain with radiculopathy: She is unable to ambulate. She is not a safe discharge from the ED. She does live by herself. She will be brought into the hospital. Reason for acute worsening or for discomfort is not entirely clear. We will proceed with MRI of the lumbar spine. Pain control with narcotics. PT and OT to see.  #2 Constipation: She does have some discomfort in the left side of the abdomen. Continue her stool softener. Prescribe laxatives. Check TSH and free T4.  #3 history of carotid artery disease, status post CABG in 2015: Appears to be stable. Denies any chest pain. Continue with her antiplatelet agent and her beta blocker.  #4 Questionable Unknown type of CHF: No echocardiogram available in our system. Her CABG was done at [facility]. I tried to look over  those records as well but no ECHo report was available. I don't have a ejection fraction on this patient. She is on Lasix at home, which will be continued from tomorrow.  #5 anemia of chronic kidney disease: She gets Aranesp injections every 8 weeks. She was supposed to get a dose today. We will order 1 dose in the hospital.  #6 history of hypothyroidism: Continue with her levothyroxin. TSH, free T4, as discussed above.  #7 Essential hypertension: Blood pressure was elevated due to pain. Continue to monitor closely.  #8 Chronic kidney disease stage IV: Creatinine appears to be close to baseline. Continue to monitor urine output.  ADDENDUM MRI report reviewed. Discussed with Dr. Ellene Route with neurosurgery. He will consult on this patient this evening.  DVT Prophylaxis: Heparin Code Status: Full code Family Communication: Discussed with the patient and her daughter  Disposition Plan: Observe to MedSurg   Further management decisions will depend on results of further testing and patient's response to treatment.   [employer]  [facility] Pager [phone]  If 7PM-7AM, please contact night-coverage [email or web address] Password [facility]  05/02/2014, 11:43 AM

## 2014-05-02 NOTE — ED Notes (Signed)
Bed: WA03 Expected date:  Expected time:  Means of arrival:  Comments: 81 y/o back pain

## 2014-05-02 NOTE — Consult Note (Signed)
Reason for Consult: Lumbar sciatica Referring Physician: Dr. Leonard Schwartz [name] [name] is an 81 y.o. female.  HPI: Patient is an 81 year old individual who's had significant back and lower extremity pain. An MRI was performed which demonstrates that the patient has evidence of old compression fractures at multiple levels including T12 and L3. At T12 however there appears to be in acute on chronic fracture with increased edema in the bone and similarly at L3 there appears to be a superior endplate fracture. There is mild spondylitic stenosis but no evidence of any overt neural compromise. The patient has been having severe pain in the back and lower extremities.  Past Medical History  Diagnosis Date  . Unspecified essential hypertension     Dr [name]  . Other and unspecified hyperlipidemia   . Unspecified disorder resulting from impaired renal function   . Diverticulosis of colon with hemorrhage   . Unspecified vitamin D deficiency   . Unspecified asthma(493.90)   . Persistent disorder of initiating or maintaining sleep   . Lumbago   . Esophageal reflux   . Diverticulosis of colon (without mention of hemorrhage)   . Depressive disorder, not elsewhere classified   . Anxiety state, unspecified   . Anemia, unspecified   . Unspecified osteomyelitis, site unspecified   . Myocardial infarction   . Thyroid disease     Past Surgical History  Procedure Laterality Date  . Bone marrow biopsy    . Status post right carotid enterectomy    . Partial colectomy  1995    diverticulosis of colon iwth hemorrhage.   . Coronary artery bypass graft  03/25/13    x4. CABS ON PUMP-Surgeon Jomarie Longs.    Family History  Problem Relation Age of Onset  . Hypertension Other   . COPD Mother   . Cancer Father     lymphoma    Social History:  reports that she has never smoked. She has never used smokeless tobacco. She reports that she drinks alcohol. She reports that she does not use illicit  drugs.  Allergies:  Allergies  Allergen Reactions  . Codeine Sulfate Nausea And Vomiting  . Darvon [Propoxyphene] Nausea And Vomiting    hallucinations  . Propoxyphene Napsylate Nausea And Vomiting  . Sulfacetamide Sodium-Sulfur Nausea And Vomiting  . Sulfamethoxazole-Trimethoprim Nausea Only    Can't remember reaction  . Zolpidem     Other reaction(s): Mental Status Changes (intolerance)    Medications: I have reviewed the patient's current medications.  Results for orders placed or performed during the hospital encounter of 05/02/14 (from the past 48 hour(s))  CBC with Differential/Platelet     Status: Abnormal   Collection Time: 05/02/14  7:39 AM  Result Value Ref Range   WBC 11.9 (H) 4.0 - 10.5 K/uL   RBC 3.87 3.87 - 5.11 MIL/uL   Hemoglobin 12.0 12.0 - 15.0 g/dL   HCT 35.9 (L) 36.0 - 46.0 %   MCV 92.8 78.0 - 100.0 fL   MCH 31.0 26.0 - 34.0 pg   MCHC 33.4 30.0 - 36.0 g/dL   RDW 13.8 11.5 - 15.5 %   Platelets 392 150 - 400 K/uL   Neutrophils Relative % 77 43 - 77 %   Neutro Abs 9.2 (H) 1.7 - 7.7 K/uL   Lymphocytes Relative 15 12 - 46 %   Lymphs Abs 1.8 0.7 - 4.0 K/uL   Monocytes Relative 6 3 - 12 %   Monocytes Absolute 0.7 0.1 - 1.0 K/uL  Eosinophils Relative 2 0 - 5 %   Eosinophils Absolute 0.2 0.0 - 0.7 K/uL   Basophils Relative 0 0 - 1 %   Basophils Absolute 0.0 0.0 - 0.1 K/uL  Basic metabolic panel     Status: Abnormal   Collection Time: 05/02/14  7:39 AM  Result Value Ref Range   Sodium 139 135 - 145 mmol/L   Potassium 3.8 3.5 - 5.1 mmol/L   Chloride 103 96 - 112 mmol/L   CO2 26 19 - 32 mmol/L   Glucose, Bld 130 (H) 70 - 99 mg/dL   BUN 45 (H) 6 - 23 mg/dL   Creatinine, Ser 2.22 (H) 0.50 - 1.10 mg/dL   Calcium 9.1 8.4 - 10.5 mg/dL   GFR calc non Af Amer 20 (L) >90 mL/min   GFR calc Af Amer 23 (L) >90 mL/min    Comment: (NOTE) The eGFR has been calculated using the CKD EPI equation. This calculation has not been validated in all clinical  situations. eGFR's persistently <90 mL/min signify possible Chronic Kidney Disease.    Anion gap 10 5 - 15  Urinalysis, Routine w reflex microscopic     Status: Abnormal   Collection Time: 05/02/14  8:42 AM  Result Value Ref Range   Color, Urine YELLOW YELLOW   APPearance CLEAR CLEAR   Specific Gravity, Urine 1.013 1.005 - 1.030   pH 5.5 5.0 - 8.0   Glucose, UA NEGATIVE NEGATIVE mg/dL   Hgb urine dipstick NEGATIVE NEGATIVE   Bilirubin Urine NEGATIVE NEGATIVE   Ketones, ur NEGATIVE NEGATIVE mg/dL   Protein, ur NEGATIVE NEGATIVE mg/dL   Urobilinogen, UA 0.2 0.0 - 1.0 mg/dL   Nitrite NEGATIVE NEGATIVE   Leukocytes, UA TRACE (A) NEGATIVE  Urine microscopic-add on     Status: None   Collection Time: 05/02/14  8:42 AM  Result Value Ref Range   Squamous Epithelial / LPF RARE RARE   WBC, UA 3-6 <3 WBC/hpf   Bacteria, UA RARE RARE  TSH     Status: None   Collection Time: 05/02/14 12:40 PM  Result Value Ref Range   TSH 2.214 0.350 - 4.500 uIU/mL  T4, free     Status: None   Collection Time: 05/02/14 12:40 PM  Result Value Ref Range   Free T4 1.44 0.80 - 1.80 ng/dL    Comment: Performed at [facility]    Mr Lumbar Spine Wo Contrast  05/02/2014   CLINICAL DATA:  81 year old female with chronic lumbar back pain radiating to the left lower extremity. Unable to ambulate. Left side sciatica. Initial encounter.  EXAM: MRI LUMBAR SPINE WITHOUT CONTRAST  TECHNIQUE: Multiplanar, multisequence MR imaging of the lumbar spine was performed. No intravenous contrast was administered.  COMPARISON:  Lumbar radiographs 08/25/2013 and earlier  FINDINGS: Normal lumbar segmentation demonstrated on the comparisons. Chronic L3, L2, and to a lesser extent L1 compression fractures. The L3 level was compressed in 2015, although there is mild endplate edema at that level and also inferiorly at L2.  Moderate T12 compression fracture also appears present on the prior studies but demonstrates mild to moderate  marrow edema (series 6, image 9).  The T11, L4, and L5 levels appear intact.  Visible sacrum intact.  Mild retropulsion of bone related to the above compression deformities and confounding degenerative disc osteophyte complex in the lumbar spine. Details below.  Visualized lower thoracic spinal cord is normal with conus medularis at L1.  Nonspecific perinephric stranding. Otherwise negative visualized abdominal viscera.  T10-T11:  Negative.  T11-T12:  Negative.  T12-L1:  Negative.  L1-L2: Disc space loss. Circumferential disc osteophyte complex. Mild to moderate facet hypertrophy. Mild spinal and moderate L1 foraminal stenosis.  L2-L3: Circumferential disc osteophyte complex. Mild facet and ligament flavum hypertrophy. Borderline to mild spinal stenosis. No significant foraminal stenosis.  L3-L4: Circumferential disc osteophyte complex. Moderate facet and ligament flavum hypertrophy. Mild spinal and L3 foraminal stenosis.  L4-L5: Disc space loss. Right eccentric circumferential disc osteophyte complex. Mild facet and ligament flavum hypertrophy. No significant spinal stenosis. Mild lateral recess and L4 foraminal stenosis.  L5-S1: Disc space loss. Circumferential disc osteophyte complex. Mild facet hypertrophy. No spinal or lateral recess stenosis. Moderate to severe L5 foraminal stenosis, primarily related to bony spurring.  IMPRESSION: 1. Multilevel chronic compression fractures. Mild to moderate marrow edema at the T12 level suggesting acute or subacute exacerbation of chronic compression. Trace marrow edema also at the L2-L3 endplates. 2. Mild retropulsion of bone at most levels exacerbating chronic disc and endplate degeneration. Up to mild degenerative spinal stenosis at L1-L2, L2-L3 and L3-L4. Mild lateral recess stenosis at L4-L5. Moderate to severe foraminal stenosis at L5-S1.   Electronically Signed   By: Genevie Ann M.D.   On: 05/02/2014 14:13    ROS Blood pressure 156/72, pulse 82, temperature 98.1 F  (36.7 C), temperature source Oral, resp. rate 18, height '5\' 3"'  (1.6 m), SpO2 98 %. Physical Exam  Assessment/Plan: Significant lumbar spondylosis with some radicular pain in a patient with new on old T12 compression fracture, and L3 superior endplate fracture.  My suspicion is that the bulk of the patient's pain is coming from the T12 fracture. She may very well be a candidate for an acrylic balloon kyphoplasty at the T12 level. Not certain that placing any cement at L3 is going to help significantly but this can also be considered. Beyond this I believe then interlaminar epidural steroid injection may help to manage her pain. His can be achieved with an interventional radiology consult. No more vigorous surgical intervention is suggested or required at this time.  Valda Christenson J 05/02/2014, 9:54 PM

## 2014-05-02 NOTE — ED Notes (Signed)
Per EMS they were contacted by pt daughter. Pt has been experiencing difficulty walking d/t pain left hip/,rib, back. Pt has not taken anything to assist with pain. She reports usually being active but has been unable. No hx of trauma.

## 2014-05-02 NOTE — Telephone Encounter (Signed)
pt daughter came in to cx todays appts due to pt in the ER......she will call us to r/s once pts gets out of hospital

## 2014-05-03 DIAGNOSIS — M549 Dorsalgia, unspecified: Secondary | ICD-10-CM | POA: Insufficient documentation

## 2014-05-03 DIAGNOSIS — M4316 Spondylolisthesis, lumbar region: Secondary | ICD-10-CM | POA: Diagnosis not present

## 2014-05-03 DIAGNOSIS — S22080A Wedge compression fracture of T11-T12 vertebra, initial encounter for closed fracture: Secondary | ICD-10-CM | POA: Diagnosis not present

## 2014-05-03 DIAGNOSIS — M4854XA Collapsed vertebra, not elsewhere classified, thoracic region, initial encounter for fracture: Secondary | ICD-10-CM | POA: Diagnosis not present

## 2014-05-03 DIAGNOSIS — M545 Low back pain: Secondary | ICD-10-CM | POA: Diagnosis not present

## 2014-05-03 DIAGNOSIS — M5442 Lumbago with sciatica, left side: Secondary | ICD-10-CM | POA: Diagnosis not present

## 2014-05-03 LAB — COMPREHENSIVE METABOLIC PANEL
ALBUMIN: 2.9 g/dL — AB (ref 3.5–5.2)
ALT: 16 U/L (ref 0–35)
ANION GAP: 6 (ref 5–15)
AST: 21 U/L (ref 0–37)
Alkaline Phosphatase: 86 U/L (ref 39–117)
BILIRUBIN TOTAL: 0.6 mg/dL (ref 0.3–1.2)
BUN: 32 mg/dL — AB (ref 6–23)
CO2: 27 mmol/L (ref 19–32)
CREATININE: 1.91 mg/dL — AB (ref 0.50–1.10)
Calcium: 8.8 mg/dL (ref 8.4–10.5)
Chloride: 105 mmol/L (ref 96–112)
GFR, EST AFRICAN AMERICAN: 27 mL/min — AB (ref >90)
GFR, EST NON AFRICAN AMERICAN: 24 mL/min — AB (ref >90)
Glucose, Bld: 88 mg/dL (ref 70–99)
Potassium: 4.4 mmol/L (ref 3.5–5.1)
Sodium: 138 mmol/L (ref 135–145)
Total Protein: 6 g/dL (ref 6.0–8.3)

## 2014-05-03 LAB — PROTIME-INR
INR: 0.98 (ref 0.00–1.49)
PROTHROMBIN TIME: 13.1 s (ref 11.6–15.2)

## 2014-05-03 LAB — CBC
HEMATOCRIT: 30.3 % — AB (ref 36.0–46.0)
Hemoglobin: 9.9 g/dL — ABNORMAL LOW (ref 12.0–15.0)
MCH: 30.8 pg (ref 26.0–34.0)
MCHC: 32.7 g/dL (ref 30.0–36.0)
MCV: 94.4 fL (ref 78.0–100.0)
PLATELETS: 364 10*3/uL (ref 150–400)
RBC: 3.21 MIL/uL — ABNORMAL LOW (ref 3.87–5.11)
RDW: 14.2 % (ref 11.5–15.5)
WBC: 6.9 10*3/uL (ref 4.0–10.5)

## 2014-05-03 LAB — APTT: APTT: 35 s (ref 24–37)

## 2014-05-03 NOTE — Progress Notes (Signed)
UR completed 

## 2014-05-03 NOTE — Progress Notes (Addendum)
Patient ID: [name] [name], female   DOB: 07-29-1931, 81 y.o.   MRN: [id number] After further review of the patient's symptoms and findings I believe that should best be served with kyphoplasty or vertebroplasty of the fractured segments noted on her MRI. We'll hold off on any epidural steroid injection at this time. I had spoken with the daughter and described the plan of action to do kyphoplasty's first and hold off on epidural steroid injections as an alternative treatment modality if much relief is not obtained with a kyphoplasty.

## 2014-05-03 NOTE — Progress Notes (Signed)
Patient ID: [name] [name], female   DOB: 1931/07/20, 81 y.o.   MRN: [id number]  [facility] PROGRESS NOTE  [name] [name] [id number] DOB: 1931/06/23 DOA: 05/02/2014 PCP: [name], MD   Brief narrative:    81 y.o. female with known CAD, status post CABG at [facility] in 2015, CKD HTN, chronic low back pain, anemia from CKD, presented to [facility] emergency department with main concern of several days duration of progressively worsening left lower back pain, constant and throbbing, occasionally sharp and stabbing, radiating to the central back area and occasionally to left lower extremity, 10/10 in severity, worse with movement and with no specific alleviating factors. Subsequently she became unable to ambulate due to pain. Patient denies any fevers, no recent traumas to the area, no numbness or tingling.  In emergency department, patient noted to be hemodynamically stable, vital signs stable, blood work notable for WBC 11.9, Cr 2.22. MRI of the lumbar spine consistent with multilevel chronic compression fractures. Neurosurgery has been consulted and [facility] asked to admit for further evaluation.  Assessment/Plan:    Principal Problem:   Left-sided low back pain with sciatica and multiple compression fractures - Patient with significant lumbar spondylosis with radicular pain, new on old T12 compression fractures, L3 superior endplate fracture - Per neurosurgeon opinion, pain most likely coming from T12 fracture - His recommendation is to proceed with kyphoplasty by interventional radiology with no more vigorous surgical intervention suggested at this time - IR consult requested for assistance - For now continue analgesia as needed   Acute functional quadriplegia - Secondary to the above - IR consult placed, will attempt PT evaluation after vertebral plasty  Active Problems:   Acute on chronic renal failure, CKD stage IV, GFR 15-30 - Likely prerenal in etiology - IV  fluids have been provided - Patient tolerating diet well, stop IV fluids and resume patient's home medical regimen with Lasix - Cr threatening down, repeat BMP in the morning   Essential hypertension - Continue Lasix and metoprolol the patient takes at home   Coronary atherosclerosis - Status post CABG - Blood pressure control, continue statin   Anemia in chronic renal disease - Drop in hemoglobin since admission likely dilutional from IV fluids that patient has received - No signs of active bleeding, repeat CBC in the morning   Hypothyroidism - Continue Synthroid   Hyperlipidemia - Continue statin   Obesity, overweight  - Body mass index is 33.3   Leukocytosis - Unclear etiology, urine culture notable for staph coag-negative species, 55 K - Patient is afebrile, WBC within normal limits this morning, patient with no urinary concerns - We'll hold off on antibiotics for now with very low threshold for initiating if patient develops fever or WBC goes up  DVT prophylaxis - heparin SQ  Code Status: Full.  Family Communication:  plan of care discussed with the patient Disposition Plan: Home when stable. Will need PT evaluation prior to discharge  IV access:  Peripheral IV  Procedures and diagnostic studies:    Mr Lumbar Spine Wo Contrast  05/02/2014  Multilevel chronic compression fractures. Mild to moderate marrow edema at the T12 level suggesting acute or subacute exacerbation of chronic compression. Trace marrow edema also at the L2-L3 endplates. 2. Mild retropulsion of bone at most levels exacerbating chronic disc and endplate degeneration. Up to mild degenerative spinal stenosis at L1-L2, L2-L3 and L3-L4. Mild lateral recess stenosis at L4-L5. Moderate to severe foraminal stenosis at L5-S1.  Medical Consultants:  Neurosurgery Interventional radiology for consideration of vertebroplasty  Other Consultants:  PT  IAnti-Infectives:   None  Faye Ramsay, MD  [facility] Pager  [phone]  If 7PM-7AM, please contact night-coverage [email or web address] Password [facility] 05/03/2014, 11:39 AM  HPI/Subjective: Persistent low back pain, throbbing and sharp, 5/10 in severity with analgesia.  Objective: Filed Vitals:   05/02/14 1535 05/02/14 2049 05/02/14 2314 05/03/14 0634  BP: 156/72 134/57  117/58  Pulse: 82 80  87  Temp: 98.1 F (36.7 C) 98.7 F (37.1 C)  98.8 F (37.1 C)  TempSrc: Oral Oral  Oral  Resp: 18 20  16   Height:   5\' 2"  (1.575 m)   Weight:   82.6 kg (182 lb 1.6 oz)   SpO2: 98% 97%  94%    Intake/Output Summary (Last 24 hours) at 05/03/14 1139 Last data filed at 05/02/14 1823  Gross per 24 hour  Intake    240 ml  Output      0 ml  Net    240 ml    Exam:   General:  Pt is alert, follows commands appropriately, not in acute distress  Cardiovascular: Regular rate and rhythm, no rubs, no gallops  Respiratory: Clear to auscultation bilaterally, no wheezing, no crackles, no rhonchi  Abdomen: Soft, non tender, non distended, bowel sounds present, no guarding  Musculoskeletal: Significant tenderness in paraspinal thoracic and lumbar area, patient hesitant to stand up due to pain  Data Reviewed: Basic Metabolic Panel:  Recent Labs Lab 05/02/14 0739 05/03/14 0530  NA 139 138  K 3.8 4.4  CL 103 105  CO2 26 27  GLUCOSE 130* 88  BUN 45* 32*  CREATININE 2.22* 1.91*  CALCIUM 9.1 8.8   Liver Function Tests:  Recent Labs Lab 05/03/14 0530  AST 21  ALT 16  ALKPHOS 86  BILITOT 0.6  PROT 6.0  ALBUMIN 2.9*   CBC:  Recent Labs Lab 05/02/14 0739 05/03/14 0530  WBC 11.9* 6.9  NEUTROABS 9.2*  --   HGB 12.0 9.9*  HCT 35.9* 30.3*  MCV 92.8 94.4  PLT 392 364   Recent Results (from the past 240 hour(s))  Urine culture     Status: None (Preliminary result)   Collection Time: 05/02/14  8:42 AM  Result Value Ref Range Status   Specimen Description URINE, CLEAN CATCH  Final   Special Requests NONE  Final   Colony Count   Final     55,000 COLONIES/ML Performed at [facility]    Culture   Final    STAPHYLOCOCCUS SPECIES (COAGULASE NEGATIVE) Note: RIFAMPIN AND GENTAMICIN SHOULD NOT BE USED AS SINGLE DRUGS FOR TREATMENT OF STAPH INFECTIONS. Performed at [facility]    Report Status PENDING  Incomplete     Scheduled Meds: . aspirin EC  325 mg Oral Daily  . atorvastatin  40 mg Oral Daily  . cholecalciferol  1,000 Units Oral Daily  . darbepoetin (ARANESP) injection - NON-DIALYSIS  100 mcg Subcutaneous Once  . furosemide  20 mg Oral Daily  . heparin  5,000 Units Subcutaneous 3 times per day  . levothyroxine  50 mcg Oral QAC breakfast  . metoprolol tartrate  12.5 mg Oral BID  . pantoprazole  40 mg Oral Daily  . polyethylene glycol  17 g Oral Daily  . potassium chloride  10 mEq Oral Daily  . senna-docusate  1 tablet Oral Daily  . sodium chloride  3 mL Intravenous Q12H  . thiamine  100 mg  Oral Daily   Continuous Infusions:

## 2014-05-03 NOTE — Progress Notes (Signed)
PT Cancellation Note  Patient Details Name: [name] [name] MRN: [id number] DOB: 1931-06-13   Cancelled Treatment:    Reason Eval/Treat Not Completed: Medical issues which prohibited therapy (for planned KP soon. check back afterwards.)   Claretha Cooper 05/03/2014, 3:50 PM Tresa Endo PT [phone]

## 2014-05-03 NOTE — Progress Notes (Signed)
OT Cancellation Note  Patient Details Name: [name] [name] MRN: [id number] DOB: [birth year]   Cancelled Treatment:    Reason Eval/Treat Not Completed: Medical issues which prohibited therapy.  Noted plan is for KP. Will check back after this.  Elveta Rape 05/03/2014, 9:25 AM  Lesle Chris, OTR/L [phone] 05/03/2014

## 2014-05-03 NOTE — Consult Note (Signed)
Reason for consult: Back pain, compression fractures, assess for possible vertebroplasty/kyphoplasty   Referring Physician(s): Dr. Dustin Folks  History of Present Illness: [name] [name] is a 81 y.o. female with significant past medical history including coronary artery disease, status post CABG at [facility] 2015, chronic kidney disease, hypertension, anemia, and chronic low back pain. Patient recently presented to the [facility] emergency department with several day history of worsening left lower back pain with occasional radiation down the left leg. Patient states she had been working in her garden recently and this past Sunday she noticed increasing back pain which did not resolve with conservative measures and became progressively worse with any movement. She currently denies any lower extremity paresthesias, bowel or bladder dysfunction. Recent urine culture revealed small amount of staph aureus but  the patient is currently afebrile with normal white count and no dysuria. At this time she states that any subtle movement exacerbates pain in the left lower back region. There is some mild tenderness in the mid back but not as significant as left lower back region. Patient denies any recent falls at home or trauma. She does report a remote history of back surgery as a child secondary to osteomyelitis. MRI of the lumbar spine performed on 05/02/14 revealed multilevel chronic compression fractures with mild to moderate marrow edema at the T12 level suggesting acute subacute exacerbation of the chronic compression fracture. There was also trace edema at L2-3 endplates and mild retropulsion of bone at most levels exacerbating chronic disc and endplate degeneration. Request has now been received for possible vertebroplasty / kyphoplasty.  Past Medical History  Diagnosis Date  . Unspecified essential hypertension     Dr [name]  . Other and unspecified hyperlipidemia   . Unspecified  disorder resulting from impaired renal function   . Diverticulosis of colon with hemorrhage   . Unspecified vitamin D deficiency   . Unspecified asthma(493.90)   . Persistent disorder of initiating or maintaining sleep   . Lumbago   . Esophageal reflux   . Diverticulosis of colon (without mention of hemorrhage)   . Depressive disorder, not elsewhere classified   . Anxiety state, unspecified   . Anemia, unspecified   . Unspecified osteomyelitis, site unspecified   . Myocardial infarction   . Thyroid disease     Past Surgical History  Procedure Laterality Date  . Bone marrow biopsy    . Status post right carotid enterectomy    . Partial colectomy  1995    diverticulosis of colon iwth hemorrhage.   . Coronary artery bypass graft  03/25/13    x4. CABS ON PUMP-Surgeon Jomarie Longs.    Allergies: Codeine sulfate; Darvon; Propoxyphene napsylate; Sulfacetamide sodium-sulfur; Sulfamethoxazole-trimethoprim; and Zolpidem  Medications: Prior to Admission medications   Medication Sig Start Date End Date Taking? Authorizing Provider  acetaminophen (TYLENOL) 650 MG CR tablet Take 325 mg by mouth every 8 (eight) hours as needed for pain (arthritis).    Yes Historical Provider, MD  albuterol (PROAIR HFA) 108 (90 BASE) MCG/ACT inhaler Inhale 2 puffs into the lungs every 4 (four) hours as needed. 01/13/14  Yes Aleksei Plotnikov V, MD  albuterol (PROVENTIL) (5 MG/ML) 0.5% nebulizer solution Take 2.5 mg by nebulization every 6 (six) hours as needed for wheezing or shortness of breath.   Yes Historical Provider, MD  aspirin 325 MG EC tablet Take 325 mg by mouth daily.   Yes Historical Provider, MD  atorvastatin (LIPITOR) 40 MG tablet Take 1 tablet (40  mg total) by mouth daily. 05/19/13  Yes Josue Hector, MD  b complex vitamins capsule Take 1 capsule by mouth daily.   Yes Historical Provider, MD  Biotin 5000 MCG CAPS Take 1 capsule by mouth daily.   Yes Historical Provider, MD  Cholecalciferol (EQL  VITAMIN D3) 1000 UNITS tablet Take 1 tablet (1,000 Units total) by mouth daily. 05/28/13  Yes Aleksei Plotnikov V, MD  darbepoetin (ARANESP) 100 MCG/0.5ML SOLN injection Inject 100 mcg into the skin every 8 (eight) weeks.   Yes Historical Provider, MD  furosemide (LASIX) 20 MG tablet Take 1 tablet (20 mg total) by mouth daily. 03/09/14  Yes Josue Hector, MD  levothyroxine (SYNTHROID, LEVOTHROID) 50 MCG tablet Take 1 tablet (50 mcg total) by mouth daily before breakfast. 05/28/13  Yes Aleksei Plotnikov V, MD  LORazepam (ATIVAN) 0.5 MG tablet Take 1 tablet (0.5 mg total) by mouth every 6 (six) hours as needed for anxiety. 04/01/14  Yes Aleksei Plotnikov V, MD  metoprolol tartrate (LOPRESSOR) 25 MG tablet Take 0.5 tablets (12.5 mg total) by mouth 2 (two) times daily. 05/19/13  Yes Josue Hector, MD  mirtazapine (REMERON) 15 MG tablet Take 1 tablet (15 mg total) by mouth at bedtime. 05/28/13  Yes Aleksei Plotnikov V, MD  omeprazole (PRILOSEC) 20 MG capsule Take 20 mg by mouth daily.   Yes Historical Provider, MD  potassium chloride (KLOR-CON 10) 10 MEQ tablet Take 1 tablet (10 mEq total) by mouth daily. 12/28/13  Yes Aleksei Plotnikov V, MD  sennosides-docusate sodium (SENOKOT-S) 8.6-50 MG tablet Take 1 tablet by mouth daily.    Yes Historical Provider, MD  predniSONE (DELTASONE) 10 MG tablet 1 tid with meals 04/18/14   Hendricks Limes, MD     Family History  Problem Relation Age of Onset  . Hypertension Other   . COPD Mother   . Cancer Father     lymphoma    History   Social History  . Marital Status: Divorced    Spouse Name: N/A  . Number of Children: N/A  . Years of Education: N/A   Social History Main Topics  . Smoking status: Never Smoker   . Smokeless tobacco: Never Used  . Alcohol Use: 0.0 oz/week    1-2 Shots of liquor per week  . Drug Use: No  . Sexual Activity: Not on file   Other Topics Concern  . None   Social History Narrative      Review of Systems  Constitutional:  Negative for fever and chills.  Respiratory: Negative for cough and shortness of breath.   Cardiovascular: Negative for chest pain.  Gastrointestinal: Positive for constipation. Negative for nausea, vomiting, abdominal pain and blood in stool.  Genitourinary: Positive for frequency. Negative for dysuria.  Musculoskeletal: Positive for back pain and gait problem.  Neurological: Negative for headaches.    Vital Signs: BP 129/60 mmHg  Pulse 82  Temp(Src) 98 F (36.7 C) (Oral)  Resp 18  Ht _0  (1.575 m)  Wt 182 lb 1.6 oz (82.6 kg)  BMI 33.30 kg/m2  SpO2 97%  Physical Exam  Constitutional: She is oriented to person, place, and time. She appears well-developed and well-nourished.  Cardiovascular: Normal rate and regular rhythm.   Pulmonary/Chest: Effort normal and breath sounds normal.  Abdominal: Soft. Bowel sounds are normal. There is no tenderness.  Musculoskeletal:  Some limitation in range of motion secondary to back pain; mild paravertebral tenderness noted at T12 level; moderate tenderness noted slightly above  and along left SI joint region  Neurological: She is alert and oriented to person, place, and time.  Sensory function intact    Mallampati Score:     Imaging: Mr Lumbar Spine Wo Contrast  05/02/2014   CLINICAL DATA:  81 year old female with chronic lumbar back pain radiating to the left lower extremity. Unable to ambulate. Left side sciatica. Initial encounter.  EXAM: MRI LUMBAR SPINE WITHOUT CONTRAST  TECHNIQUE: Multiplanar, multisequence MR imaging of the lumbar spine was performed. No intravenous contrast was administered.  COMPARISON:  Lumbar radiographs 08/25/2013 and earlier  FINDINGS: Normal lumbar segmentation demonstrated on the comparisons. Chronic L3, L2, and to a lesser extent L1 compression fractures. The L3 level was compressed in 2015, although there is mild endplate edema at that level and also inferiorly at L2.  Moderate T12 compression fracture also  appears present on the prior studies but demonstrates mild to moderate marrow edema (series 6, image 9).  The T11, L4, and L5 levels appear intact.  Visible sacrum intact.  Mild retropulsion of bone related to the above compression deformities and confounding degenerative disc osteophyte complex in the lumbar spine. Details below.  Visualized lower thoracic spinal cord is normal with conus medularis at L1.  Nonspecific perinephric stranding. Otherwise negative visualized abdominal viscera.  T10-T11:  Negative.  T11-T12:  Negative.  T12-L1:  Negative.  L1-L2: Disc space loss. Circumferential disc osteophyte complex. Mild to moderate facet hypertrophy. Mild spinal and moderate L1 foraminal stenosis.  L2-L3: Circumferential disc osteophyte complex. Mild facet and ligament flavum hypertrophy. Borderline to mild spinal stenosis. No significant foraminal stenosis.  L3-L4: Circumferential disc osteophyte complex. Moderate facet and ligament flavum hypertrophy. Mild spinal and L3 foraminal stenosis.  L4-L5: Disc space loss. Right eccentric circumferential disc osteophyte complex. Mild facet and ligament flavum hypertrophy. No significant spinal stenosis. Mild lateral recess and L4 foraminal stenosis.  L5-S1: Disc space loss. Circumferential disc osteophyte complex. Mild facet hypertrophy. No spinal or lateral recess stenosis. Moderate to severe L5 foraminal stenosis, primarily related to bony spurring.  IMPRESSION: 1. Multilevel chronic compression fractures. Mild to moderate marrow edema at the T12 level suggesting acute or subacute exacerbation of chronic compression. Trace marrow edema also at the L2-L3 endplates. 2. Mild retropulsion of bone at most levels exacerbating chronic disc and endplate degeneration. Up to mild degenerative spinal stenosis at L1-L2, L2-L3 and L3-L4. Mild lateral recess stenosis at L4-L5. Moderate to severe foraminal stenosis at L5-S1.   Electronically Signed   By: Genevie Ann M.D.   On: 05/02/2014  14:13   Dg Toe Great Right  04/19/2014   CLINICAL DATA:  81 year old female with right first toe pain for 3 days. No history trauma provided. Initial encounter.  EXAM: RIGHT GREAT TOE  COMPARISON:  None.  FINDINGS: No fracture or dislocation.  Minimal hallux valgus deformity.  No bony erosion.  IMPRESSION: Minimal hallux valgus.   Electronically Signed   By: Genia Del M.D.   On: 04/19/2014 08:48    Labs:  CBC:  Recent Labs  03/10/14 1051 04/18/14 1702 05/02/14 0739 05/03/14 0530  WBC 7.1 11.4* 11.9* 6.9  HGB 10.3* 11.2* 12.0 9.9*  HCT 31.7* 33.3* 35.9* 30.3*  PLT 324 385.0 392 364    COAGS:  Recent Labs  05/03/14 1005  INR 0.98  APTT 35    BMP:  Recent Labs  10/01/13 0950 11/15/13 1038 12/27/13 0935 05/02/14 0739 05/03/14 0530  NA 140 140 138 139 138  K 3.6 3.7 3.4* 3.8 4.4  CL 102  --  103 103 105  CO2 _0 GLUCOSE 96 101 101* 130* 88  BUN 38* 42.9* 39* 45* 32*  CALCIUM 9.2 9.4 9.2 9.1 8.8  CREATININE 2.2* 2.0* 2.1* 2.22* 1.91*  GFRNONAA  --   --   --  20* 24*  GFRAA  --   --   --  23* 27*    LIVER FUNCTION TESTS:  Recent Labs  05/31/13 1016 10/01/13 0950 11/15/13 1038 05/03/14 0530  BILITOT 0.38 0.6 0.53 0.6  AST _1 ALT _2 ALKPHOS 103 122* 101 86  PROT 7.2 7.1 6.6 6.0  ALBUMIN 3.2* 3.3* 3.3* 2.9*    TUMOR MARKERS: No results for input(s): AFPTM, CEA, CA199, CHROMGRNA in the last 8760 hours.  Assessment and Plan: Patient with history of acute on chronic low back pain unrelieved with conservative measures and recent MRI lumbar spine revealing multilevel chronic compression fractures. There is mild to moderate edema at T12 as well as L3 levels suggesting  acute or subacute exacerbation of the chronic compression fractures. Imaging studies have been reviewed and patient appears to be candidate for T12 and possibly L3 vertebroplasty/kyphoplasty. Risks and benefits discussed with the patient/daughter including, but  not limited to education regarding the natural healing process of compression fractures without intervention, bleeding, infection, cement migration which may cause spinal cord damage, paralysis, pulmonary embolism or even death. All of the patient's questions were answered, patient is agreeable to proceed. Consent signed and in chart. Procedure will be tentatively scheduled for 4/14.       Signed: Autumn Messing 05/03/2014, 1:51 PM   I spent a total of 20 minutes in face to face in clinical consultation, greater than 50% of which was counseling/coordinating care for T12 and possibly L3 vertebral body cement augmentation

## 2014-05-04 DIAGNOSIS — M8448XD Pathological fracture, other site, subsequent encounter for fracture with routine healing: Secondary | ICD-10-CM

## 2014-05-04 DIAGNOSIS — M4854XG Collapsed vertebra, not elsewhere classified, thoracic region, subsequent encounter for fracture with delayed healing: Secondary | ICD-10-CM | POA: Diagnosis not present

## 2014-05-04 DIAGNOSIS — N184 Chronic kidney disease, stage 4 (severe): Secondary | ICD-10-CM | POA: Diagnosis not present

## 2014-05-04 DIAGNOSIS — M4854XA Collapsed vertebra, not elsewhere classified, thoracic region, initial encounter for fracture: Secondary | ICD-10-CM | POA: Diagnosis not present

## 2014-05-04 DIAGNOSIS — Z789 Other specified health status: Secondary | ICD-10-CM | POA: Diagnosis not present

## 2014-05-04 DIAGNOSIS — S22080G Wedge compression fracture of T11-T12 vertebra, subsequent encounter for fracture with delayed healing: Secondary | ICD-10-CM

## 2014-05-04 LAB — CBC
HCT: 32.6 % — ABNORMAL LOW (ref 36.0–46.0)
Hemoglobin: 10.6 g/dL — ABNORMAL LOW (ref 12.0–15.0)
MCH: 30.7 pg (ref 26.0–34.0)
MCHC: 32.5 g/dL (ref 30.0–36.0)
MCV: 94.5 fL (ref 78.0–100.0)
PLATELETS: 378 10*3/uL (ref 150–400)
RBC: 3.45 MIL/uL — ABNORMAL LOW (ref 3.87–5.11)
RDW: 14.2 % (ref 11.5–15.5)
WBC: 7.2 10*3/uL (ref 4.0–10.5)

## 2014-05-04 LAB — BASIC METABOLIC PANEL
Anion gap: 9 (ref 5–15)
BUN: 35 mg/dL — ABNORMAL HIGH (ref 6–23)
CALCIUM: 8.8 mg/dL (ref 8.4–10.5)
CHLORIDE: 104 mmol/L (ref 96–112)
CO2: 28 mmol/L (ref 19–32)
Creatinine, Ser: 2.29 mg/dL — ABNORMAL HIGH (ref 0.50–1.10)
GFR calc Af Amer: 22 mL/min — ABNORMAL LOW (ref >90)
GFR calc non Af Amer: 19 mL/min — ABNORMAL LOW (ref >90)
Glucose, Bld: 90 mg/dL (ref 70–99)
Potassium: 4.5 mmol/L (ref 3.5–5.1)
SODIUM: 141 mmol/L (ref 135–145)

## 2014-05-04 LAB — URINE CULTURE

## 2014-05-04 MED ORDER — OXYCODONE HCL 5 MG PO TABS: 5.0000 mg | ORAL_TABLET | ORAL | Status: DC | PRN

## 2014-05-04 MED ORDER — HEPARIN SODIUM (PORCINE) 5000 UNIT/ML IJ SOLN
5000.0000 [IU] | Freq: Three times a day (TID) | INTRAMUSCULAR | Status: DC
  Filled 2014-05-04: qty 1

## 2014-05-04 NOTE — Progress Notes (Addendum)
EDCM spoke to patient's daughters Fraser Din and Wannetta Sender at bedside.  Patient's daughters concerned about patient's safety at home if patient were to go home this evening.  Patient is non ambulatory per patient's daughters.  Patient's daughter reports she has read the ABN note that was left at patient's bedside table.  EDCM asked patient's daughter if she had any questions regarding ABN, patient's daughter stated, "No I don't, it's quite clear.  But I have asked that it not be given to me until I got there, but it was left on my mother's bedside table, she read it,and now she is very upset."  EDCM explained to patient's daughter that the purpose of ABN was to notify them that Medicare may or may not pay for patient's continued observation stay.  When [facility] entered patient's room, patient's daughter was on the phone with Medicare trying to appeal the patient's discharge.  EDCM informed patient's daughter that patient is observation and has been since admission and therefore she does not have the right to appeal her discharge.  Patient's daughters very upset that no one notified them that patient was observation. Patient's daughter stated that someone at the front desk told her that the patient was discharged and that there was a discharge summary.  Per chart review, there is no discharge order or discharge summary.  EDCM informed patient's daughter that home health services can be arranged, and equipment delivery could also be arranged if needed.  Patient stated, "If your trained professionals won't touch her, how am I going to take care of her at home?"  [facility] informed patient's daughter that patient would be taken home by ambulance and home health services could be arranged.  Patient's daughter reports, "I'm teeter tottering on whether I should sign that form or not (ABN)."  EDCM informed patient's daughter she has a right not to sign, it's purpose was just to notify the patient that she may get billed.  EDCM informed  patient's daughter that kyphoplasty is an outpatient procedure and can be taken to this procedure from home.  Patient's daughters want to know what time patient is scheduled for procedure tomorrow.  Unit RN trying to find out this information.  EDCM left voice message for Medical Director updating on current patient situation at 2028pm. 05/04/2014 A. Dallis Czaja RNCM 2120pm EDCM called unit RN to see if ABN was signed and left in patient's room.  ABN not in patient's room anymore, patient's family no longer in the room.

## 2014-05-04 NOTE — Progress Notes (Addendum)
PROGRESS NOTE  [name] [name] [id number] DOB: 07/05/1931 DOA: 05/02/2014 PCP: [name], MD Brief History 81 y.o. female with known CAD, status post CABG at [facility] in [facility], CKD HTN, chronic low back pain, anemia from CKD, presented to [facility] emergency department with main concern of several days duration of progressively worsening left lower back pain, constant and throbbing, occasionally sharp and stabbing, radiating to the central back area and occasionally to left lower extremity, 10/10 in severity, worse with movement and with no specific alleviating factors. Subsequently she became unable to ambulate due to pain. Patient denies any fevers, no recent traumas to the area, no numbness or tingling.  In emergency department, patient noted to be hemodynamically stable, vital signs stable, blood work notable for WBC 11.9, Cr 2.22. MRI of the lumbar spine consistent with multilevel chronic compression fractures. Neurosurgery has been consulted and [facility] asked to admit for further evaluation. Assessment/Plan:  Left-sided low back pain with sciatica and multiple Lumbar compression fractures - Patient with significant lumbar spondylosis with radicular pain, acute on chronicT12 compression fractures, L3 superior endplate fracture - Per neurosurgeon opinion, pain most likely coming from T12 fracture - Dr. Ellene Route recommendation is to proceed with kyphoplasty by interventional radiology with no more vigorous surgical intervention suggested at this time - IR plans kyphoplasty on 05/05/14 - continue opioids -discussed with RN and ancillary staff-->pt is max assist with ADLs and physical therapy does not wish to eval patient until after kyphoplasty due to safety concerns -patient normally lives by herself and d/c would be to home -spoke with IR--if pt is discharged, kyphoplasty would need to be rescheduled for logistical and authorization reasons thus delaying definitive tx    -Therefore, clinically, patient is not clinically safe for discharge at this time to home by herself and I also question safety of discharge to family member given there would be delay in the procedure (kyphoplasty)  in the outpatient setting.  Deconditioning - Secondary to the above - IR consult placed -will attempt PT evaluation after vertebral plasty  Active Problems: CKD stage IV, GFR 15-30 - Baseline creatinine 1.9-2 0.3 - IV fluids have been provided - Patient tolerating diet well, stop IV fluids and resume patient's home medical regimen with Lasix - Cr threatening down - repeat BMP in the morning  Essential hypertension - Continue Lasix and metoprolol the patient takes at home  Coronary atherosclerosis - Status post CABG - Blood pressure control, continue statin and ASA  Anemia in chronic renal disease - Drop in hemoglobin since admission likely dilutional from IV fluids that patient has received - No signs of active bleeding -Baseline hemoglobin 10-11  Hypothyroidism - Continue Synthroid -TSH 2.24, free T4 1.44   Hyperlipidemia - Continue statin  Obesity, overweight  - Body mass index is 33.3  Leukocytosis - improved - due to stress demargination - urine culture notable for staph coag-negative species, 55 K - Patient is afebrile, WBC within normal limits this morning, patient with no urinary concerns - We'll hold off on antibiotics for now with very low threshold for initiating if patient develops fever or WBC goes up Constipation -start colace and senna -no BM in one week DVT prophylaxis - heparin SQ  Code Status: Full.  Family Communication: plan of care discussed with the patient Disposition Plan: Home 1-2 days. Will need PT evaluation prior to discharge     Family Communication:   Pt at beside Disposition Plan:  Home when medically stable       Procedures/Studies: Mr Lumbar Spine Wo Contrast  05/02/2014   CLINICAL DATA:  81 year old  female with chronic lumbar back pain radiating to the left lower extremity. Unable to ambulate. Left side sciatica. Initial encounter.  EXAM: MRI LUMBAR SPINE WITHOUT CONTRAST  TECHNIQUE: Multiplanar, multisequence MR imaging of the lumbar spine was performed. No intravenous contrast was administered.  COMPARISON:  Lumbar radiographs 08/25/2013 and earlier  FINDINGS: Normal lumbar segmentation demonstrated on the comparisons. Chronic L3, L2, and to a lesser extent L1 compression fractures. The L3 level was compressed in 2015, although there is mild endplate edema at that level and also inferiorly at L2.  Moderate T12 compression fracture also appears present on the prior studies but demonstrates mild to moderate marrow edema (series 6, image 9).  The T11, L4, and L5 levels appear intact.  Visible sacrum intact.  Mild retropulsion of bone related to the above compression deformities and confounding degenerative disc osteophyte complex in the lumbar spine. Details below.  Visualized lower thoracic spinal cord is normal with conus medularis at L1.  Nonspecific perinephric stranding. Otherwise negative visualized abdominal viscera.  T10-T11:  Negative.  T11-T12:  Negative.  T12-L1:  Negative.  L1-L2: Disc space loss. Circumferential disc osteophyte complex. Mild to moderate facet hypertrophy. Mild spinal and moderate L1 foraminal stenosis.  L2-L3: Circumferential disc osteophyte complex. Mild facet and ligament flavum hypertrophy. Borderline to mild spinal stenosis. No significant foraminal stenosis.  L3-L4: Circumferential disc osteophyte complex. Moderate facet and ligament flavum hypertrophy. Mild spinal and L3 foraminal stenosis.  L4-L5: Disc space loss. Right eccentric circumferential disc osteophyte complex. Mild facet and ligament flavum hypertrophy. No significant spinal stenosis. Mild lateral recess and L4 foraminal stenosis.  L5-S1: Disc space loss. Circumferential disc osteophyte complex. Mild facet  hypertrophy. No spinal or lateral recess stenosis. Moderate to severe L5 foraminal stenosis, primarily related to bony spurring.  IMPRESSION: 1. Multilevel chronic compression fractures. Mild to moderate marrow edema at the T12 level suggesting acute or subacute exacerbation of chronic compression. Trace marrow edema also at the L2-L3 endplates. 2. Mild retropulsion of bone at most levels exacerbating chronic disc and endplate degeneration. Up to mild degenerative spinal stenosis at L1-L2, L2-L3 and L3-L4. Mild lateral recess stenosis at L4-L5. Moderate to severe foraminal stenosis at L5-S1.   Electronically Signed   By: Genevie Ann M.D.   On: 05/02/2014 14:13   Dg Toe Great Right  04/19/2014   CLINICAL DATA:  81 year old female with right first toe pain for 3 days. No history trauma provided. Initial encounter.  EXAM: RIGHT GREAT TOE  COMPARISON:  None.  FINDINGS: No fracture or dislocation.  Minimal hallux valgus deformity.  No bony erosion.  IMPRESSION: Minimal hallux valgus.   Electronically Signed   By: Genia Del M.D.   On: 04/19/2014 08:48         Subjective: patient complains of low back pain shooting to the left leg with movement. Denies fevers, chills, chest pain, shortness breath, nausea, vomiting, diarrhea. Complains of constipation.   Objective: Filed Vitals:   05/03/14 0634 05/03/14 1312 05/03/14 2127 05/04/14 0528  BP: 117/58 129/60 130/54 125/59  Pulse: 87 82 89 80  Temp: 98.8 F (37.1 C) 98 F (36.7 C) 98 F (36.7 C) 98.4 F (36.9 C)  TempSrc: Oral Oral Oral Oral  Resp: 16 18 18 18   Height:      Weight:      SpO2: 94% 97% 96% 95%  Intake/Output Summary (Last 24 hours) at 05/04/14 0823 Last data filed at 05/03/14 1426  Gross per 24 hour  Intake    480 ml  Output      0 ml  Net    480 ml   Weight change:  Exam:   General:  Pt is alert, follows commands appropriately, not in acute distress  HEENT: No icterus, No thrush,[place]/AT  Cardiovascular: RRR, S1/S2, no  rubs, no gallops  Respiratory: CTA bilaterally, no wheezing, no crackles, no rhonchi  Abdomen: Soft/+BS, non tender, non distended, no guarding  Extremities: No edema, No lymphangitis, No petechiae, No rashes, no synovitis  Data Reviewed: Basic Metabolic Panel:  Recent Labs Lab 05/02/14 0739 05/03/14 0530 05/04/14 0510  NA 139 138 141  K 3.8 4.4 4.5  CL 103 105 104  CO2 26 27 28   GLUCOSE 130* 88 90  BUN 45* 32* 35*  CREATININE 2.22* 1.91* 2.29*  CALCIUM 9.1 8.8 8.8   Liver Function Tests:  Recent Labs Lab 05/03/14 0530  AST 21  ALT 16  ALKPHOS 86  BILITOT 0.6  PROT 6.0  ALBUMIN 2.9*   No results for input(s): LIPASE, AMYLASE in the last 168 hours. No results for input(s): AMMONIA in the last 168 hours. CBC:  Recent Labs Lab 05/02/14 0739 05/03/14 0530 05/04/14 0510  WBC 11.9* 6.9 7.2  NEUTROABS 9.2*  --   --   HGB 12.0 9.9* 10.6*  HCT 35.9* 30.3* 32.6*  MCV 92.8 94.4 94.5  PLT 392 364 378   Cardiac Enzymes: No results for input(s): CKTOTAL, CKMB, CKMBINDEX, TROPONINI in the last 168 hours. BNP: Invalid input(s): POCBNP CBG: No results for input(s): GLUCAP in the last 168 hours.  Recent Results (from the past 240 hour(s))  Urine culture     Status: None (Preliminary result)   Collection Time: 05/02/14  8:42 AM  Result Value Ref Range Status   Specimen Description URINE, CLEAN CATCH  Final   Special Requests NONE  Final   Colony Count   Final    55,000 COLONIES/ML Performed at [facility]    Culture   Final    STAPHYLOCOCCUS SPECIES (COAGULASE NEGATIVE) Note: RIFAMPIN AND GENTAMICIN SHOULD NOT BE USED AS SINGLE DRUGS FOR TREATMENT OF STAPH INFECTIONS. Performed at [facility]    Report Status PENDING  Incomplete     Scheduled Meds: . aspirin EC  325 mg Oral Daily  . atorvastatin  40 mg Oral Daily  . cholecalciferol  1,000 Units Oral Daily  . furosemide  20 mg Oral Daily  . heparin  5,000 Units Subcutaneous 3 times per  day  . levothyroxine  50 mcg Oral QAC breakfast  . metoprolol tartrate  12.5 mg Oral BID  . pantoprazole  40 mg Oral Daily  . polyethylene glycol  17 g Oral Daily  . senna-docusate  1 tablet Oral Daily  . sodium chloride  3 mL Intravenous Q12H  . thiamine  100 mg Oral Daily   Continuous Infusions:    Alyene Predmore, DO  [facility] Pager [phone]  If 7PM-7AM, please contact night-coverage [email or web address] Password [facility] 05/04/2014, 8:23 AM

## 2014-05-04 NOTE — Progress Notes (Signed)
UR completed 

## 2014-05-04 NOTE — Progress Notes (Signed)
PT Cancellation Note  Patient Details Name: [name] [name] MRN: [id number] DOB: [birth year]   Cancelled Treatment:    Reason Eval/Treat Not Completed:  (noted for KP/VP 4/14 possibly. will await orders following procedure. )   Claretha Cooper 05/04/2014, 6:58 AM Tresa Endo PT [phone]

## 2014-05-04 NOTE — Progress Notes (Signed)
Pt's family at bedside. Pt is d/c per MD, Family reluctant to take Pt home. MD notified. Will continue to monitor.

## 2014-05-04 NOTE — Progress Notes (Signed)
CARE MANAGEMENT NOTE 05/04/2014  Patient:  [name] [name], [name] [name]   Account Number:  [id number]  Date Initiated:  05/02/2014  Documentation initiated by:  Edwyna Shell  Subjective/Objective Assessment:   81 yo admitted with Left-sided low back pain with sciatica     Action/Plan:   discharge planning   Anticipated DC Date:  05/04/2014   Anticipated DC Plan:  [place]  CM consult      Choice offered to / List presented to:             Status of service:  In process, will continue to follow Medicare Important Message given?   (If response is "NO", the following Medicare IM given date fields will be blank) Date Medicare IM given:   Medicare IM given by:   Date Additional Medicare IM given:   Additional Medicare IM given by:    Discharge Disposition:  [place]  Per UR Regulation:    If discussed at Long Length of Stay Meetings, dates discussed:    Comments:  05/04/14 Edwyna Shell RN BSN CM [phone] Informed by medical advisor to issue an ABN to the patient. Contacted the patient's daughter, [name] [name], [place] to inform her of the need to isssue the ABN, read the ABN to patircia over the phone, providing education on the role of the ABN and the options available and signature needed. [name] [name] stated that she will not provide an option selection over the phone, that when she arrives at the hospital she will review the form and make an informed decision at that time. Explained that a copy of the form will be left in the patient's room and to contact this CM when she arrives. MD aware that ABn has been served.  05/02/14 Edwyna Shell RN BSN CM [phone] Spoke with patient and family present in the room. Patient is from home alone and has a walker, cane and elevated toilet seat although the patient stated that she does not use the DME regularly. She has had [place] services in the past with [facility]. Will continue to follow for CM  needs.

## 2014-05-04 NOTE — Progress Notes (Addendum)
EDCM called AC who reports patient is on the list for IR for kyphoplasty tomorrow for here at [facility], however, will not know exactly what time she will be having the procedure until after 7am.  EDCM called and left voice mail for patient's daughter Gilmore Laroche [phone] informing her of above.  05/04/2014 A. Chavela Justiniano RNCM 2200pm EDCM received phone call from patient's daughter Gilmore Laroche confirming she has received [facility] message regarding kyphoplasty.  Gilmore Laroche went on to apologize if she came across the wrong way during previous conversation.  Gilmore Laroche went on to say that she does not hold [facility] or nursing staff accountable for this situation.  EDCM again emphasized that ADN was just a notification letter not a letter saying that the patient has to be discharged.  Gilmore Laroche reports she understands this.  Gilmore Laroche reports she has the ADN in her purse and will bring it back with her tomorrow.  EDCM offered support to patient's daughter.  Patient's daughter very thankful for [facility] follow up phone call.  Patient's daughter requesting phone call in the morning regarding time of procedure.  [facility] will notify unit RN.

## 2014-05-05 ENCOUNTER — Observation Stay (HOSPITAL_COMMUNITY): Payer: Medicare Other

## 2014-05-05 ENCOUNTER — Inpatient Hospital Stay (HOSPITAL_COMMUNITY): Admit: 2014-05-05 | Payer: Self-pay

## 2014-05-05 ENCOUNTER — Other Ambulatory Visit: Payer: Self-pay | Admitting: Cardiovascular Disease

## 2014-05-05 ENCOUNTER — Telehealth (HOSPITAL_COMMUNITY): Payer: Self-pay | Admitting: Radiology

## 2014-05-05 DIAGNOSIS — M4854XA Collapsed vertebra, not elsewhere classified, thoracic region, initial encounter for fracture: Secondary | ICD-10-CM | POA: Diagnosis not present

## 2014-05-05 DIAGNOSIS — N189 Chronic kidney disease, unspecified: Secondary | ICD-10-CM | POA: Diagnosis not present

## 2014-05-05 DIAGNOSIS — N184 Chronic kidney disease, stage 4 (severe): Secondary | ICD-10-CM | POA: Diagnosis not present

## 2014-05-05 DIAGNOSIS — M4854XG Collapsed vertebra, not elsewhere classified, thoracic region, subsequent encounter for fracture with delayed healing: Secondary | ICD-10-CM | POA: Diagnosis not present

## 2014-05-05 DIAGNOSIS — D631 Anemia in chronic kidney disease: Secondary | ICD-10-CM | POA: Diagnosis not present

## 2014-05-05 LAB — BASIC METABOLIC PANEL
ANION GAP: 9 (ref 5–15)
BUN: 32 mg/dL — AB (ref 6–23)
CO2: 28 mmol/L (ref 19–32)
CREATININE: 2.38 mg/dL — AB (ref 0.50–1.10)
Calcium: 9.1 mg/dL (ref 8.4–10.5)
Chloride: 100 mmol/L (ref 96–112)
GFR calc Af Amer: 21 mL/min — ABNORMAL LOW (ref >90)
GFR calc non Af Amer: 18 mL/min — ABNORMAL LOW (ref >90)
Glucose, Bld: 94 mg/dL (ref 70–99)
Potassium: 4.4 mmol/L (ref 3.5–5.1)
Sodium: 137 mmol/L (ref 135–145)

## 2014-05-05 MED ORDER — MIDAZOLAM HCL 2 MG/2ML IJ SOLN
INTRAMUSCULAR | Status: AC | PRN
  Administered 2014-05-05 (×2): 0.5 mg via INTRAVENOUS
  Administered 2014-05-05: 1 mg via INTRAVENOUS
  Administered 2014-05-05 (×5): 0.5 mg via INTRAVENOUS

## 2014-05-05 MED ORDER — FENTANYL CITRATE 0.05 MG/ML IJ SOLN
INTRAMUSCULAR | Status: AC | PRN
  Administered 2014-05-05: 25 ug via INTRAVENOUS
  Administered 2014-05-05: 50 ug via INTRAVENOUS
  Administered 2014-05-05: 25 ug via INTRAVENOUS

## 2014-05-05 MED ORDER — LIDOCAINE HCL (PF) 1 % IJ SOLN
INTRAMUSCULAR | Status: AC
  Filled 2014-05-05: qty 30

## 2014-05-05 MED ORDER — CEFAZOLIN SODIUM-DEXTROSE 2-3 GM-% IV SOLR
INTRAVENOUS | Status: AC
Start: 2014-05-05 — End: 2014-05-05
  Filled 2014-05-05: qty 50

## 2014-05-05 MED ORDER — CEFAZOLIN SODIUM-DEXTROSE 2-3 GM-% IV SOLR
2.0000 g | Freq: Once | INTRAVENOUS | Status: AC
  Administered 2014-05-05: 2 g via INTRAVENOUS

## 2014-05-05 MED ORDER — MIDAZOLAM HCL 2 MG/2ML IJ SOLN
INTRAMUSCULAR | Status: AC
  Filled 2014-05-05: qty 6

## 2014-05-05 MED ORDER — FENTANYL CITRATE 0.05 MG/ML IJ SOLN
INTRAMUSCULAR | Status: AC
  Filled 2014-05-05: qty 4

## 2014-05-05 NOTE — Progress Notes (Signed)
   05/05/14 1642  PT Time Calculation  PT Start Time (ACUTE ONLY) 1515  PT Stop Time (ACUTE ONLY) 1600  PT Time Calculation (min) (ACUTE ONLY) 45 min  PT G-Codes **NOT FOR INPATIENT CLASS**  Functional Assessment Tool Used (clinical judgement)  Functional Limitation Mobility: Walking and moving around  Mobility: Walking and Moving Around Current Status [id number]) CK  Mobility: Walking and Moving Around Goal Status [id number]) CI  PT General Charges  $$ ACUTE PT VISIT 1 Procedure  PT Evaluation  $Initial PT Evaluation Tier I 1 Procedure   Dayvion Sans, MPT [phone]

## 2014-05-05 NOTE — Evaluation (Signed)
Occupational Therapy Evaluation Patient Details Name: [name] [name] MRN: [id number] DOB: [birth year] Today's Date: 05/05/2014    History of Present Illness pt was admitted with back pain and found to have T12 compression fx.  She is s/p KP   Clinical Impression   This 81 year old female was admitted for the above.  She will benefit from skilled OT to increase safety and independence with adls.  Pt was mod I prior to admission. Daughters will stay with pt and assist.  Goals in acute are for overall min A.  She currently needs mod to max A for ADLs and min A for ambulating.  +2 assist (mod was needed for bed mobility at time of evaluation.      Follow Up Recommendations  Home health OT;Supervision/Assistance - 24 hour    Equipment Recommendations  None recommended by OT    Recommendations for Other Services       Precautions / Restrictions Precautions Precautions: Back Restrictions Weight Bearing Restrictions: No      Mobility Bed Mobility Overal bed mobility: +2 for physical assistance Bed Mobility: Rolling;Sidelying to Sit Rolling: Mod assist Sidelying to sit: Mod assist;+2 for safety/equipment       General bed mobility comments: utilized pad to roll--educated family that they can use folded sheet to assist.  Pt did use rail to push up  Transfers Overall transfer level: Needs assistance Equipment used: Rolling walker (2 wheeled) Transfers: Sit to/from Stand Sit to Stand: Min assist         General transfer comment: cues for UE placement    Balance                                            ADL Overall ADL's : Needs assistance/impaired     Grooming: Set up;Sitting   Upper Body Bathing: Minimal assitance;Sitting   Lower Body Bathing: Maximal assistance;Sit to/from stand   Upper Body Dressing : Minimal assistance;Sitting   Lower Body Dressing: Maximal assistance;Sit to/from stand   Toilet Transfer: Minimal  assistance;Ambulation;BSC   Toileting- Water quality scientist and Hygiene: Sit to/from stand;Maximal assistance         General ADL Comments: Woke pt up from sound sleep.  She was not dizzy initially when getting up:  supine BP 157/73 sitting 167/60.  C/o dizziness after ambulating into hall--did not get BP.  After sitting for several minutes, she ambulated to bathroom without dizziness.  She was having difficulty producing BM.  Called NT to stay with her in bathroom after we waited 10 minutes. Pt does have some AE at home.  She had difficulty bending prior to admission. Educated daughters on back precautions for comfort and handout given.  Pt and family appreciative.       Vision     Perception     Praxis      Pertinent Vitals/Pain Pain Assessment: Faces Faces Pain Scale: Hurts even more Pain Location: L neck/shoulder Pain Descriptors / Indicators: Aching Pain Intervention(s): Limited activity within patient's tolerance;Monitored during session;Repositioned;Heat applied     Hand Dominance     Extremity/Trunk Assessment Upper Extremity Assessment Upper Extremity Assessment:  (able to reach to 90 bil:  MMT deferred)           Communication Communication Communication: No difficulties   Cognition Arousal/Alertness: Awake/alert Behavior During Therapy: WFL for tasks assessed/performed Overall Cognitive Status: Within Functional Limits for  tasks assessed                     General Comments       Exercises       Shoulder Instructions      Home Living Family/patient expects to be discharged to:: Private residence Living Arrangements: Alone Available Help at Discharge: Family;Available 24 hours/day                   Bathroom Toilet: Standard     Home Equipment: Walker - 2 wheels;Bedside commode          Prior Functioning/Environment Level of Independence: Independent with assistive device(s)             OT Diagnosis: Generalized  weakness;Acute pain   OT Problem List: Decreased strength;Decreased activity tolerance;Decreased knowledge of use of DME or AE;Pain;Decreased knowledge of precautions   OT Treatment/Interventions: Self-care/ADL training;DME and/or AE instruction;Patient/family education    OT Goals(Current goals can be found in the care plan section) Acute Rehab OT Goals Patient Stated Goal: decreased pain and get back to being independent OT Goal Formulation: With patient Time For Goal Achievement: 05/12/14 Potential to Achieve Goals: Good ADL Goals Pt Will Perform Lower Body Bathing: with min assist;with adaptive equipment;sit to/from stand Pt Will Perform Lower Body Dressing: with mod assist;with adaptive equipment;sit to/from stand Pt Will Transfer to Toilet: with min guard assist;ambulating;bedside commode Pt Will Perform Toileting - Clothing Manipulation and hygiene: with min assist;sit to/from stand Additional ADL Goal #1: pt will perform bed mobility following back precautions with min A  OT Frequency: Min 2X/week   Barriers to D/C:            Co-evaluation PT/OT/SLP Co-Evaluation/Treatment: Yes Reason for Co-Treatment: For patient/therapist safety PT goals addressed during session: Mobility/safety with mobility OT goals addressed during session: ADL's and self-care      End of Session    Activity Tolerance: No increased pain;Patient limited by fatigue Patient left: with nursing/sitter in room;with family/visitor present (on 3:1 commode:  NT in room)   Time: [name] [name] OT Time Calculation (min): 54 min Charges:  OT General Charges $OT Visit: 1 Procedure OT Evaluation $Initial OT Evaluation Tier I: 1 Procedure OT Treatments $Self Care/Home Management : 8-22 mins G-Codes: OT G-codes **NOT FOR INPATIENT CLASS** Functional Assessment Tool Used: clinical observation and judgment Functional Limitation: Self care Self Care Current Status [id number]): At least 60 percent but less than 80  percent impaired, limited or restricted Self Care Goal Status [id number]): At least 20 percent but less than 40 percent impaired, limited or restricted  [name] [name] 05/05/2014, 4:29 PM [name] [name], OTR/L [phone] 05/05/2014

## 2014-05-05 NOTE — Procedures (Signed)
Technically successful cement augmentation of the T12 vertebral body. Patient is to lie flat for 3 hours. No immediate post procedural complications.

## 2014-05-05 NOTE — Progress Notes (Signed)
UR completed 

## 2014-05-05 NOTE — Evaluation (Signed)
Physical Therapy Evaluation Patient Details Name: [name] [name] MRN: [id number] DOB: 1931/06/19 Today's Date: 05/05/2014   History of Present Illness  pt was admitted with back pain and found to have a T12 compression fx.  She is s/p T12 KP 05/05/14  Clinical Impression  On eval, pt required Mod assist +2 for bed mobility and Min assist to stand/ambulate ~25 feet with RW. Pt complaining mostly of L sided neck pain-given warm pack during session. Daughters present during session-multiple questions addressed.     Follow Up Recommendations Home health PT;Supervision/Assistance - 24 hour    Equipment Recommendations  None recommended by PT    Recommendations for Other Services OT consult     Precautions / Restrictions Precautions Precautions: Back Precaution Comments: Educated on logroll and back precautions for safe mobility Restrictions Weight Bearing Restrictions: No      Mobility  Bed Mobility Overal bed mobility: Needs Assistance Bed Mobility: Rolling;Sidelying to Sit Rolling: Mod assist Sidelying to sit: Mod assist;+2 for physical assistance;+2 for safety/equipment       General bed mobility comments: utilized pad to roll--educated family that they can use folded sheet to assist.  Pt did use rail to push up. Increased time.   Transfers Overall transfer level: Needs assistance Equipment used: Rolling walker (2 wheeled) Transfers: Sit to/from Stand Sit to Stand: Min assist         General transfer comment: Assist to rise, stabilize, control descent. VCS safety, hand placement. Increased time.   Ambulation/Gait Ambulation/Gait assistance: Min assist Ambulation Distance (Feet): 25 Feet Assistive device: Rolling walker (2 wheeled) Gait Pattern/deviations: Step-through pattern;Decreased stride length     General Gait Details: slow gait speed. 2  brief standing rest breaks. Pt fatigues easily.   Stairs            Wheelchair Mobility    Modified Rankin  (Stroke Patients Only)       Balance                                             Pertinent Vitals/Pain Pain Assessment: Faces Faces Pain Scale: Hurts even more Pain Location: L neck/shoulder, back Pain Descriptors / Indicators: Aching;Sore Pain Intervention(s): Monitored during session;Heat applied;Repositioned    Home Living Family/patient expects to be discharged to:: Private residence Living Arrangements: Alone Available Help at Discharge: Family;Available 24 hours/day (daughters)           Home Equipment: Walker - 2 wheels;Bedside commode      Prior Function Level of Independence: Independent with assistive device(s)         Comments: not using walker until most recently.      Hand Dominance        Extremity/Trunk Assessment   Upper Extremity Assessment: Defer to OT evaluation           Lower Extremity Assessment: Generalized weakness      Cervical / Trunk Assessment: Kyphotic  Communication   Communication: No difficulties  Cognition Arousal/Alertness: Awake/alert Behavior During Therapy: WFL for tasks assessed/performed Overall Cognitive Status: Within Functional Limits for tasks assessed                      General Comments      Exercises        Assessment/Plan    PT Assessment Patient needs continued PT services  PT Diagnosis Difficulty walking;Acute pain;Generalized  weakness   PT Problem List Decreased strength;Decreased activity tolerance;Decreased balance;Decreased mobility;Decreased knowledge of use of DME;Pain;Decreased knowledge of precautions  PT Treatment Interventions DME instruction;Gait training;Functional mobility training;Therapeutic activities;Therapeutic exercise;Patient/family education;Balance training   PT Goals (Current goals can be found in the Care Plan section) Acute Rehab PT Goals Patient Stated Goal: decreased pain and get back to being independent PT Goal Formulation: With  patient/family Time For Goal Achievement: 05/19/14 Potential to Achieve Goals: Good    Frequency Min 3X/week   Barriers to discharge        Co-evaluation   Reason for Co-Treatment: For patient/therapist safety PT goals addressed during session: Mobility/safety with mobility;Proper use of DME OT goals addressed during session: ADL's and self-care       End of Session   Activity Tolerance: Patient limited by fatigue;Patient limited by pain Patient left: with family/visitor present;with call bell/phone within reach (bathroom-made RN aware of need for assistnce out. )           Time: 1515-1600 PT Time Calculation (min) (ACUTE ONLY): 45 min   Charges:   PT Evaluation $Initial PT Evaluation Tier I: 1 Procedure     PT G Codes:        Weston Anna, MPT Pager: [phone]

## 2014-05-05 NOTE — Progress Notes (Signed)
CARE MANAGEMENT NOTE 05/05/2014  Patient:  [name] [name], [name] [name]   Account Number:  [id number]  Date Initiated:  05/02/2014  Documentation initiated by:  Edwyna Shell  Subjective/Objective Assessment:   81 yo admitted with Left-sided low back pain with sciatica     Action/Plan:   discharge planning   Anticipated DC Date:  05/04/2014   Anticipated DC Plan:  [place]  CM consult      [facility] Choice  HOME HEALTH   Choice offered to / List presented to:  [name]   DME arranged  Vassie Moselle      DME agency  [place] arranged  HH-3 OT  HH-1 RN  HH-10 DISEASE MANAGEMENT  [place].   Status of service:  Completed, signed off Medicare Important Message given?   (If response is "NO", the following Medicare IM given date fields will be blank) Date Medicare IM given:   Medicare IM given by:   Date Additional Medicare IM given:   Additional Medicare IM given by:    Discharge Disposition:  [place]  Per UR Regulation:    If discussed at Long Length of Stay Meetings, dates discussed:    Comments:  05/05/14 [name] CM Patient and daughter's chose [facility] for [facility] services, [facility] rep, Cyril Mourning made aware of referral and spoke with patient and daughters. Rolling walker was also delivered to the room by [facility] DME specialtist. This CM also provided the patient and daughters with private duty sitter list if they felt that extra support is needed in the home.  05/04/14 Edwyna Shell RN BSN CM [phone] Informed by medical advisor to issue an ABN to the patient. Contacted the patient's daughter, [name] [name], [place] to inform her of the need to isssue the ABN, read the ABN to [name] [name] over the phone, providing education on the role of the ABN and the options available and signature needed. [name] [name] stated that she will not provide an option selection over the  phone, that when she arrives at the hospital she will review the form and make an informed decision at that time. Explained that a copy of the form will be left in the patient's room and to contact this CM when she arrives. MD aware that ABn has been served. Daughter refused to sign ABN.  05/02/14 Edwyna Shell RN BSN CM [phone] Spoke with patient and family present in the room. Patient is from home alone and has a walker, cane and elevated toilet seat although the patient stated that she does not use the DME regularly. She has had [place] services in the past with [facility]. Will continue to follow for CM needs.

## 2014-05-05 NOTE — Discharge Summary (Addendum)
Physician Discharge Summary  [name] [name] [id number] DOB: April 13, 1931 DOA: 05/02/2014  PCP: [name], MD  Admit date: 05/02/2014 Discharge date: 05/05/2014  Recommendations for Outpatient Follow-up:  1. Pt will need to follow up with PCP in 1-2 weeks post discharge 2. Please obtain BMP and CBC in 1-2 weeks  Discharge Diagnoses:   Left-sided low back pain with sciatica and multiple Lumbar compression fractures - Patient with significant lumbar spondylosis with radicular pain, acute on chronicT12 compression fractures, L3 superior endplate fracture - Per neurosurgeon opinion, pain most likely coming from T12 fracture - Dr. Ellene Route recommendation is to proceed with kyphoplasty by interventional radiology with no more vigorous surgical intervention suggested at this time - IR kyphoplasty on 05/05/14 - continue opioids -OT and PT evaluated the patient after kyphoplasty and recommended home health PT and OT Deconditioning - Secondary to the above - IR consult placed -PT/OT  Evaluation-->recommended home health PT/OT which was set up with the assistance of care management  Active Problems: CKD stage IV, GFR 15-30 - Baseline creatinine 1.9-2.3 - IV fluids have been provided - Patient tolerating diet well, stop IV fluids and resume patient's home medical regimen with Lasix - Serum creatinine remained within patient's baseline throughout the hospitalization  Essential hypertension - Continue Lasix and metoprolol the patient takes at home  Coronary atherosclerosis - Status post CABG - Blood pressure control, continue statin and ASA  Anemia in chronic renal disease - Drop in hemoglobin since admission likely dilutional from IV fluids that patient has received -after initial drop, Hgb remained stable thereafter - No signs of active bleeding -Baseline hemoglobin 10-11  Hypothyroidism - Continue Synthroid -TSH 2.24, free T4 1.44   Hyperlipidemia - Continue statin   Obesity, overweight  - Body mass index is 33.3  Leukocytosis - improved - due to stress demargination - urine culture notable for staph coag-negative species, 55 K - Patient is afebrile, WBC within normal limits this morning, patient with no urinary concerns - We'll hold off on antibiotics for now with very low threshold for initiating if patient develops fever or WBC goes up Constipation -start colace and senna -no BM in one week DVT prophylaxis - heparin SQ  Code Status: Full.  Family Communication: plan of care discussed with the patient and daughters at bedside  Discharge Condition: stable  Disposition: home  Diet:heart heatlhy Wt Readings from Last 3 Encounters:  05/02/14 82.6 kg (182 lb 1.6 oz)  05/02/14 82.555 kg (182 lb)  04/18/14 82.555 kg (182 lb)    History of present illness:  81 y.o. female with known CAD, status post CABG at [facility] in 2015, CKD HTN, chronic low back pain, anemia from CKD, presented to [facility] emergency department with main concern of several days duration of progressively worsening left lower back pain, constant and throbbing, occasionally sharp and stabbing, radiating to the central back area and occasionally to left lower extremity, 10/10 in severity, worse with movement and with no specific alleviating factors. Subsequently she became unable to ambulate due to pain. Patient denies any fevers, no recent traumas to the area, no numbness or tingling.  In emergency department, patient noted to be hemodynamically stable, vital signs stable, blood work notable for WBC 11.9, Cr 2.22. MRI of the lumbar spine consistent with multilevel chronic compression fractures. Neurosurgery has been consulted and [facility] asked to admit for further evaluation. Neurosurgery did not feel that the patient was a good surgical candidate. They recommended kyphoplasty by interventional radiology. The patient's  pain was controlled with oral oxycodone although she  remained quite deconditioned and had significant pain with movement. Unfortunately, the patient's kyphoplasty was scheduled on 05/05/2014. The patient remained under observation status per her care management and utilization review's recommendations. The patient's family did not feel that the patient was stable to be able to take home. The patient did require significant assistance for activities of daily living.  As a result, the patient stayed until the day of the kyphoplasty on 05/05/2014. She was evaluated by OT and physical therapy after kyphoplasty and they recommended home health PT and OT which was set up with the assistance of care management.  Consultants: IR Neurosurgery--Elsner  Discharge Exam: Filed Vitals:   05/05/14 1418  BP: 165/86  Pulse: 94  Temp: 98.6 F (37 C)  Resp: 16   Filed Vitals:   05/05/14 1203 05/05/14 1256 05/05/14 1257 05/05/14 1418  BP: 131/64 130/57  165/86  Pulse: 90 87  94  Temp: 97.7 F (36.5 C) 98.1 F (36.7 C)  98.6 F (37 C)  TempSrc: Oral Oral  Oral  Resp: 16 14  16   Height:      Weight:      SpO2: 92% 89% 97% 99%   General: A&O x 3, NAD, pleasant, cooperative Cardiovascular: RRR, no rub, no gallop, no S3 Respiratory: Bibasilar crackles. No wheezing. Good air movement. Abdomen:soft, nontender, nondistended, positive bowel sounds Extremities: No edema, No lymphangitis, no petechiae  Discharge Instructions     Medication List    STOP taking these medications        mirtazapine 15 MG tablet  Commonly known as:  REMERON     omeprazole 20 MG capsule  Commonly known as:  PRILOSEC     potassium chloride 10 MEQ tablet  Commonly known as:  KLOR-CON 10     predniSONE 10 MG tablet  Commonly known as:  DELTASONE      TAKE these medications        acetaminophen 650 MG CR tablet  Commonly known as:  TYLENOL  Take 325 mg by mouth every 8 (eight) hours as needed for pain (arthritis).     albuterol (5 MG/ML) 0.5% nebulizer solution    Commonly known as:  PROVENTIL  Take 2.5 mg by nebulization every 6 (six) hours as needed for wheezing or shortness of breath.     albuterol 108 (90 BASE) MCG/ACT inhaler  Commonly known as:  PROAIR HFA  Inhale 2 puffs into the lungs every 4 (four) hours as needed.     aspirin 325 MG EC tablet  Take 325 mg by mouth daily.     atorvastatin 40 MG tablet  Commonly known as:  LIPITOR  Take 1 tablet (40 mg total) by mouth daily.     b complex vitamins capsule  Take 1 capsule by mouth daily.     Biotin 5000 MCG Caps  Take 1 capsule by mouth daily.     Cholecalciferol 1000 UNITS tablet  Commonly known as:  EQL VITAMIN D3  Take 1 tablet (1,000 Units total) by mouth daily.     darbepoetin 100 MCG/0.5ML Soln injection  Commonly known as:  ARANESP  Inject 100 mcg into the skin every 8 (eight) weeks.     furosemide 20 MG tablet  Commonly known as:  LASIX  Take 1 tablet (20 mg total) by mouth daily.     levothyroxine 50 MCG tablet  Commonly known as:  SYNTHROID, LEVOTHROID  Take 1 tablet (50 mcg total)  by mouth daily before breakfast.     LORazepam 0.5 MG tablet  Commonly known as:  ATIVAN  Take 1 tablet (0.5 mg total) by mouth every 6 (six) hours as needed for anxiety.     metoprolol tartrate 25 MG tablet  Commonly known as:  LOPRESSOR  Take 0.5 tablets (12.5 mg total) by mouth 2 (two) times daily.     oxyCODONE 5 MG immediate release tablet  Commonly known as:  Oxy IR/ROXICODONE  Take 1 tablet (5 mg total) by mouth every 4 (four) hours as needed for moderate pain.     sennosides-docusate sodium 8.6-50 MG tablet  Commonly known as:  SENOKOT-S  Take 1 tablet by mouth daily.         The results of significant diagnostics from this hospitalization (including imaging, microbiology, ancillary and laboratory) are listed below for reference.    Significant Diagnostic Studies: Mr Lumbar Spine Wo Contrast  05/02/2014   CLINICAL DATA:  81 year old female with chronic lumbar  back pain radiating to the left lower extremity. Unable to ambulate. Left side sciatica. Initial encounter.  EXAM: MRI LUMBAR SPINE WITHOUT CONTRAST  TECHNIQUE: Multiplanar, multisequence MR imaging of the lumbar spine was performed. No intravenous contrast was administered.  COMPARISON:  Lumbar radiographs 08/25/2013 and earlier  FINDINGS: Normal lumbar segmentation demonstrated on the comparisons. Chronic L3, L2, and to a lesser extent L1 compression fractures. The L3 level was compressed in 2015, although there is mild endplate edema at that level and also inferiorly at L2.  Moderate T12 compression fracture also appears present on the prior studies but demonstrates mild to moderate marrow edema (series 6, image 9).  The T11, L4, and L5 levels appear intact.  Visible sacrum intact.  Mild retropulsion of bone related to the above compression deformities and confounding degenerative disc osteophyte complex in the lumbar spine. Details below.  Visualized lower thoracic spinal cord is normal with conus medularis at L1.  Nonspecific perinephric stranding. Otherwise negative visualized abdominal viscera.  T10-T11:  Negative.  T11-T12:  Negative.  T12-L1:  Negative.  L1-L2: Disc space loss. Circumferential disc osteophyte complex. Mild to moderate facet hypertrophy. Mild spinal and moderate L1 foraminal stenosis.  L2-L3: Circumferential disc osteophyte complex. Mild facet and ligament flavum hypertrophy. Borderline to mild spinal stenosis. No significant foraminal stenosis.  L3-L4: Circumferential disc osteophyte complex. Moderate facet and ligament flavum hypertrophy. Mild spinal and L3 foraminal stenosis.  L4-L5: Disc space loss. Right eccentric circumferential disc osteophyte complex. Mild facet and ligament flavum hypertrophy. No significant spinal stenosis. Mild lateral recess and L4 foraminal stenosis.  L5-S1: Disc space loss. Circumferential disc osteophyte complex. Mild facet hypertrophy. No spinal or lateral  recess stenosis. Moderate to severe L5 foraminal stenosis, primarily related to bony spurring.  IMPRESSION: 1. Multilevel chronic compression fractures. Mild to moderate marrow edema at the T12 level suggesting acute or subacute exacerbation of chronic compression. Trace marrow edema also at the L2-L3 endplates. 2. Mild retropulsion of bone at most levels exacerbating chronic disc and endplate degeneration. Up to mild degenerative spinal stenosis at L1-L2, L2-L3 and L3-L4. Mild lateral recess stenosis at L4-L5. Moderate to severe foraminal stenosis at L5-S1.   Electronically Signed   By: Genevie Ann M.D.   On: 05/02/2014 14:13   Dg Toe Great Right  04/19/2014   CLINICAL DATA:  81 year old female with right first toe pain for 3 days. No history trauma provided. Initial encounter.  EXAM: RIGHT GREAT TOE  COMPARISON:  None.  FINDINGS: No fracture  or dislocation.  Minimal hallux valgus deformity.  No bony erosion.  IMPRESSION: Minimal hallux valgus.   Electronically Signed   By: Genia Del M.D.   On: 04/19/2014 08:48   Ir Kypho Vertebral Thoracic Augmentation  05/05/2014   CLINICAL DATA:  Patient recently presented to the [facility] emergency department with several day history of worsening left lower back pain with occasional radiation down the left leg. Patient states she had been working in her garden recently and this past Sunday when she noticed increasing back pain which did not resolve with conservative measures and became progressively worse with any movement.  MRI of the lumbar spine performed on 05/02/14 revealed multilevel chronic compression fractures with mild to moderate marrow edema at the T12 level suggesting acute subacute exacerbation of the chronic compression fracture. There was also trace edema at L2-3 endplates and mild retropulsion of bone at most levels exacerbating chronic disc and endplate degeneration.  Patient was seen in consultation by interventional radiology PA, Rowe Robert, on  05/03/2014, with bedside examination demonstrating focal back pain pain reproduction with direct palpation of the T12 vertebral body. On physical examination, it is uncertain whether the patient has pain reproducible to the mild compression deformity involving the superior endplate of L3.  As such, request made for cement augmentation of the T12 vertebral body and possibly the L3 vertebral body.  EXAM: FLUOROSCOPIC GUIDED KYPHOPLASTY OF THE T12 VERTEBRAL BODY  COMPARISON:  Lumbar spine MRI - 05/02/2014  MEDICATIONS: As antibiotic prophylaxis, and staph 2 g IV was ordered pre-procedure and administered intravenously within 1 hour of incision.  ANESTHESIA/SEDATION: Versed 4.5 mg IV; Fentanyl 100 mcg IV  Sedation time:  min  FLUOROSCOPY TIME:  9 min, ([id number] mGy)  COMPLICATIONS: None immediate  TECHNIQUE: The procedure, risks (including but not limited to bleeding, infection, organ damage), benefits, and alternatives were explained to the patient. Questions regarding the procedure were encouraged and answered. The patient understands and consents to the procedure.  The patient was placed prone on the fluoroscopic table. Fluoroscopic imaging was performed to demarcate both the T12 and L3 vertebral bodies. Patient reports reproduction of her admitting back pain with direct palpation of the posterior elements of the T12 vertebral body. The patient was not focally tender with direct palpation of the L3 vertebral body and as such, the decision was made to proceed only with T12 kyphoplasty.  As such, the skin overlying the lower thoracic / upper lumbar region was then prepped and draped in the usual sterile fashion. Maximal barrier sterile technique was utilized including caps, mask, sterile gowns, sterile gloves, sterile drape, hand hygiene and skin antiseptic. Intravenous Fentanyl and Versed were administered as conscious sedation during continuous cardiorespiratory monitoring by the radiology RN.  The left posterior lateral  aspect of the T12 vertebral body was then infiltrated with 1% lidocaine followed by the advancement of a Kyphon trocar needle was advanced into the posterior lateral aspect of the T12 vertebral body via an extra pedicular approach. Subsequently, the osteo drill was advanced to the anterior third of the vertebral body. The osteo drill was retracted. Through the working cannula, a Kyphon inflatable bone tamp 20 x 3 was advanced and positioned with the distal marker approximately 5 mm from the anterior aspect of the cortex. Appropriate positioning was confirmed on the AP projection. At this time, the balloon was expanded using contrast via a Kyphon inflation syringe device via micro tubing.  In similar fashion, the right posterior lateral aspect of T12 vertebral  body was infiltrated with 1% lidocaine followed by the advancement of a second Kyphon trocar needle into the posterior third of the vertebral body also via an extra pedicular approach. Subsequently, the osteo drill was coaxially advanced to the anterior right third. The osteo drill was exchanged for a Kyphon inflatable bone tamp 20 x 3, advanced to the 5 mm of the anterior aspect of the cortex. The balloon was then expanded using contrast as above.  Inflations were continued until there was near apposition with the superior end plate. At this time, methylmethacrylate mixture was reconstituted in the Kyphon bone mixing device system. This was then loaded into the delivery mechanism, attached to Kyphon bone fillers.  The balloons were deflated and removed followed by the instillation of methylmethacrylate mixture with excellent filling in the AP and lateral projections. No extravasation was noted in the disk spaces or posteriorly into the spinal canal. No epidural venous contamination was seen.  The working cannulae and the bone filler were then retrieved and removed. Hemostasis was achieved with manual compression. The patient tolerated the procedure well  without immediate postprocedural complication.  IMPRESSION: 1. Technically successful T12 vertebral body augmentation using balloon kyphoplasty. 2. Patient did not report reproduction of back pain with direct palpation of the L3 vertebral body. As such, cement augmentation was not performed at this level. 3. Per CMS PQRS reporting requirements (PQRS Measure 24): Given the patient's age of greater than 10 and the fracture site (hip, distal radius, or spine), the patient should be tested for osteoporosis using DXA, and the appropriate treatment considered based on the DXA results.   Electronically Signed   By: Sandi Mariscal M.D.   On: 05/05/2014 12:45     Microbiology: Recent Results (from the past 240 hour(s))  Urine culture     Status: None   Collection Time: 05/02/14  8:42 AM  Result Value Ref Range Status   Specimen Description URINE, CLEAN CATCH  Final   Special Requests NONE  Final   Colony Count   Final    55,000 COLONIES/ML Performed at [facility]    Culture   Final    STAPHYLOCOCCUS SPECIES (COAGULASE NEGATIVE) Note: RIFAMPIN AND GENTAMICIN SHOULD NOT BE USED AS SINGLE DRUGS FOR TREATMENT OF STAPH INFECTIONS. Performed at [facility]    Report Status 05/04/2014 FINAL  Final   Organism ID, Bacteria STAPHYLOCOCCUS SPECIES (COAGULASE NEGATIVE)  Final      Susceptibility   Staphylococcus species (coagulase negative) - MIC*    GENTAMICIN <=0.5 SENSITIVE Sensitive     LEVOFLOXACIN <=0.12 SENSITIVE Sensitive     NITROFURANTOIN <=16 SENSITIVE Sensitive     OXACILLIN <=0.25 SENSITIVE Sensitive     PENICILLIN >=0.5 RESISTANT Resistant     RIFAMPIN <=0.5 SENSITIVE Sensitive     TRIMETH/SULFA <=10 SENSITIVE Sensitive     VANCOMYCIN <=0.5 SENSITIVE Sensitive     TETRACYCLINE <=1 SENSITIVE Sensitive     * STAPHYLOCOCCUS SPECIES (COAGULASE NEGATIVE)     Labs: Basic Metabolic Panel:  Recent Labs Lab 05/02/14 0739 05/03/14 0530 05/04/14 0510 05/05/14 0535  NA 139  138 141 137  K 3.8 4.4 4.5 4.4  CL 103 105 104 100  CO2 26 27 28 28   GLUCOSE 130* 88 90 94  BUN 45* 32* 35* 32*  CREATININE 2.22* 1.91* 2.29* 2.38*  CALCIUM 9.1 8.8 8.8 9.1   Liver Function Tests:  Recent Labs Lab 05/03/14 0530  AST 21  ALT 16  ALKPHOS 86  BILITOT 0.6  PROT 6.0  ALBUMIN 2.9*   No results for input(s): LIPASE, AMYLASE in the last 168 hours. No results for input(s): AMMONIA in the last 168 hours. CBC:  Recent Labs Lab 05/02/14 0739 05/03/14 0530 05/04/14 0510  WBC 11.9* 6.9 7.2  NEUTROABS 9.2*  --   --   HGB 12.0 9.9* 10.6*  HCT 35.9* 30.3* 32.6*  MCV 92.8 94.4 94.5  PLT 392 364 378   Cardiac Enzymes: No results for input(s): CKTOTAL, CKMB, CKMBINDEX, TROPONINI in the last 168 hours. BNP: Invalid input(s): POCBNP CBG: No results for input(s): GLUCAP in the last 168 hours.  Time coordinating discharge:  Greater than 30 minutes  Signed:  Traniece Boffa, DO [facility] Pager: [phone] 05/05/2014, 2:31 PM

## 2014-05-05 NOTE — Progress Notes (Signed)
Pt has been discharged, discharge instructions given to pt/dtr; verbalized understanding. Pt's daughter is upset that she has not had a bowel movement. MD notified, Pt already had  meds. as ordered. Eventually, Pt had a bowel movement and states  She feels better. PT's daughter still not satisfied and tried to leave without the pt. RN asked that she please moves her car to D/C area and Pt will be brought on W/C for D/C home. The daughter left angry telling RN that that was the icing on the cake. Pt was taken to the car by RN and Nurse Tech and  they left safely.

## 2014-05-06 ENCOUNTER — Telehealth: Payer: Self-pay | Admitting: *Deleted

## 2014-05-06 DIAGNOSIS — N184 Chronic kidney disease, stage 4 (severe): Secondary | ICD-10-CM | POA: Diagnosis not present

## 2014-05-06 DIAGNOSIS — M4854XD Collapsed vertebra, not elsewhere classified, thoracic region, subsequent encounter for fracture with routine healing: Secondary | ICD-10-CM | POA: Diagnosis not present

## 2014-05-06 DIAGNOSIS — Z4789 Encounter for other orthopedic aftercare: Secondary | ICD-10-CM | POA: Diagnosis not present

## 2014-05-06 DIAGNOSIS — I129 Hypertensive chronic kidney disease with stage 1 through stage 4 chronic kidney disease, or unspecified chronic kidney disease: Secondary | ICD-10-CM | POA: Diagnosis not present

## 2014-05-06 DIAGNOSIS — M4726 Other spondylosis with radiculopathy, lumbar region: Secondary | ICD-10-CM | POA: Diagnosis not present

## 2014-05-06 DIAGNOSIS — M5432 Sciatica, left side: Secondary | ICD-10-CM | POA: Diagnosis not present

## 2014-05-06 DIAGNOSIS — M4856XD Collapsed vertebra, not elsewhere classified, lumbar region, subsequent encounter for fracture with routine healing: Secondary | ICD-10-CM | POA: Diagnosis not present

## 2014-05-06 NOTE — Telephone Encounter (Signed)
Transition Care Management Follow-up Telephone Call D/C 05/05/14  How have you been since you were released from the hospital? Spoke to pt/daughter [name] [name]) mom is doing better  Do you understand why you were in the hospital? YES   Do you understand the discharge instrcutions? YES  Items Reviewed:  Medications reviewed: YES, Needing to get clarification on medication that is on d/c summary but pt states she need to take. Hosp stop remeron, omeprazole,and potassium. Inform pdaughter will send md msg to clarify  Allergies reviewed: YES  Dietary changes reviewed: YES  Referrals reviewed: YES, daughter stated advance has been out today to see mom   Functional Questionnaire:   Activities of Daily Living (ADLs):   She states she are independent in the following: ambulation, bathing and hygiene, feeding, continence, grooming, toileting and dressing States she require assistance with the following: ambulation sometimes   Any transportation issues/concerns?: NO   Any patient concerns? NO   Confirmed importance and date/time of follow-up visits scheduled: YES, made appt for 05/12/14 w/Dr. Plotnikov}   Confirmed with patient if condition begins to worsen call PCP or go to the ER.

## 2014-05-06 NOTE — Telephone Encounter (Signed)
Called pt to set up her TCM appt d/c yesterday. On d/c it was stated for pt to stop taking her remeron, omeprazole, and potassium. Did not list a reason why these medications was stop. Patient states she need to take her omeprazole for acid reflex. They have her taking lasix not sure why potassium was stop. She has made appt for next Thursday but wanting to continue taking meds, and reason why med was stop....Johny Chess

## 2014-05-06 NOTE — Telephone Encounter (Signed)
Ok re-start Omeprazole, Remeron. Do not take potassium. Use lasix for swelling only (if needed) Thx

## 2014-05-07 ENCOUNTER — Other Ambulatory Visit: Payer: Self-pay | Admitting: Cardiovascular Disease

## 2014-05-07 DIAGNOSIS — M4726 Other spondylosis with radiculopathy, lumbar region: Secondary | ICD-10-CM | POA: Diagnosis not present

## 2014-05-07 DIAGNOSIS — M4854XD Collapsed vertebra, not elsewhere classified, thoracic region, subsequent encounter for fracture with routine healing: Secondary | ICD-10-CM | POA: Diagnosis not present

## 2014-05-07 DIAGNOSIS — I129 Hypertensive chronic kidney disease with stage 1 through stage 4 chronic kidney disease, or unspecified chronic kidney disease: Secondary | ICD-10-CM | POA: Diagnosis not present

## 2014-05-07 DIAGNOSIS — M4856XD Collapsed vertebra, not elsewhere classified, lumbar region, subsequent encounter for fracture with routine healing: Secondary | ICD-10-CM | POA: Diagnosis not present

## 2014-05-07 DIAGNOSIS — M5432 Sciatica, left side: Secondary | ICD-10-CM | POA: Diagnosis not present

## 2014-05-07 DIAGNOSIS — I6529 Occlusion and stenosis of unspecified carotid artery: Secondary | ICD-10-CM

## 2014-05-07 DIAGNOSIS — Z4789 Encounter for other orthopedic aftercare: Secondary | ICD-10-CM | POA: Diagnosis not present

## 2014-05-09 DIAGNOSIS — M5432 Sciatica, left side: Secondary | ICD-10-CM | POA: Diagnosis not present

## 2014-05-09 DIAGNOSIS — M4854XD Collapsed vertebra, not elsewhere classified, thoracic region, subsequent encounter for fracture with routine healing: Secondary | ICD-10-CM | POA: Diagnosis not present

## 2014-05-09 DIAGNOSIS — M4856XD Collapsed vertebra, not elsewhere classified, lumbar region, subsequent encounter for fracture with routine healing: Secondary | ICD-10-CM | POA: Diagnosis not present

## 2014-05-09 DIAGNOSIS — Z4789 Encounter for other orthopedic aftercare: Secondary | ICD-10-CM | POA: Diagnosis not present

## 2014-05-09 DIAGNOSIS — M4726 Other spondylosis with radiculopathy, lumbar region: Secondary | ICD-10-CM | POA: Diagnosis not present

## 2014-05-09 DIAGNOSIS — I129 Hypertensive chronic kidney disease with stage 1 through stage 4 chronic kidney disease, or unspecified chronic kidney disease: Secondary | ICD-10-CM | POA: Diagnosis not present

## 2014-05-09 NOTE — Telephone Encounter (Signed)
Called pt/daughter no answer LMOM RTC...[name] [name]

## 2014-05-09 NOTE — Telephone Encounter (Signed)
Pt return call back gave her md response...[name] [name]

## 2014-05-10 DIAGNOSIS — M4854XD Collapsed vertebra, not elsewhere classified, thoracic region, subsequent encounter for fracture with routine healing: Secondary | ICD-10-CM | POA: Diagnosis not present

## 2014-05-10 DIAGNOSIS — M4856XD Collapsed vertebra, not elsewhere classified, lumbar region, subsequent encounter for fracture with routine healing: Secondary | ICD-10-CM | POA: Diagnosis not present

## 2014-05-10 DIAGNOSIS — I129 Hypertensive chronic kidney disease with stage 1 through stage 4 chronic kidney disease, or unspecified chronic kidney disease: Secondary | ICD-10-CM | POA: Diagnosis not present

## 2014-05-10 DIAGNOSIS — M5432 Sciatica, left side: Secondary | ICD-10-CM | POA: Diagnosis not present

## 2014-05-10 DIAGNOSIS — Z4789 Encounter for other orthopedic aftercare: Secondary | ICD-10-CM | POA: Diagnosis not present

## 2014-05-10 DIAGNOSIS — M4726 Other spondylosis with radiculopathy, lumbar region: Secondary | ICD-10-CM | POA: Diagnosis not present

## 2014-05-11 DIAGNOSIS — M4726 Other spondylosis with radiculopathy, lumbar region: Secondary | ICD-10-CM | POA: Diagnosis not present

## 2014-05-11 DIAGNOSIS — I129 Hypertensive chronic kidney disease with stage 1 through stage 4 chronic kidney disease, or unspecified chronic kidney disease: Secondary | ICD-10-CM | POA: Diagnosis not present

## 2014-05-11 DIAGNOSIS — M4854XD Collapsed vertebra, not elsewhere classified, thoracic region, subsequent encounter for fracture with routine healing: Secondary | ICD-10-CM | POA: Diagnosis not present

## 2014-05-11 DIAGNOSIS — M5432 Sciatica, left side: Secondary | ICD-10-CM | POA: Diagnosis not present

## 2014-05-11 DIAGNOSIS — Z4789 Encounter for other orthopedic aftercare: Secondary | ICD-10-CM | POA: Diagnosis not present

## 2014-05-11 DIAGNOSIS — M4856XD Collapsed vertebra, not elsewhere classified, lumbar region, subsequent encounter for fracture with routine healing: Secondary | ICD-10-CM | POA: Diagnosis not present

## 2014-05-12 ENCOUNTER — Ambulatory Visit (INDEPENDENT_AMBULATORY_CARE_PROVIDER_SITE_OTHER): Payer: Medicare Other | Admitting: Internal Medicine

## 2014-05-12 ENCOUNTER — Encounter: Payer: Self-pay | Admitting: Internal Medicine

## 2014-05-12 VITALS — BP 110/58 | HR 79 | Wt 176.0 lb

## 2014-05-12 DIAGNOSIS — M5441 Lumbago with sciatica, right side: Secondary | ICD-10-CM | POA: Diagnosis not present

## 2014-05-12 DIAGNOSIS — I214 Non-ST elevation (NSTEMI) myocardial infarction: Secondary | ICD-10-CM | POA: Diagnosis not present

## 2014-05-12 DIAGNOSIS — M4854XG Collapsed vertebra, not elsewhere classified, thoracic region, subsequent encounter for fracture with delayed healing: Secondary | ICD-10-CM

## 2014-05-12 DIAGNOSIS — S22080G Wedge compression fracture of T11-T12 vertebra, subsequent encounter for fracture with delayed healing: Secondary | ICD-10-CM

## 2014-05-12 DIAGNOSIS — M5442 Lumbago with sciatica, left side: Secondary | ICD-10-CM

## 2014-05-12 DIAGNOSIS — I10 Essential (primary) hypertension: Secondary | ICD-10-CM | POA: Diagnosis not present

## 2014-05-12 MED ORDER — PANTOPRAZOLE SODIUM 40 MG PO TBEC: 40.0000 mg | DELAYED_RELEASE_TABLET | Freq: Every day | ORAL | Status: DC

## 2014-05-12 MED ORDER — OXYCODONE HCL 5 MG PO TABS: 5.0000 mg | ORAL_TABLET | Freq: Two times a day (BID) | ORAL | Status: DC | PRN

## 2014-05-12 MED ORDER — POLYETHYLENE GLYCOL 3350 17 GM/SCOOP PO POWD: 17.0000 g | Freq: Two times a day (BID) | ORAL | Status: DC | PRN

## 2014-05-12 MED ORDER — CHOLECALCIFEROL 25 MCG (1000 UT) PO TABS: 1000.0000 [IU] | ORAL_TABLET | Freq: Every day | ORAL | Status: AC

## 2014-05-12 NOTE — Assessment & Plan Note (Signed)
acute on chronicT12 compression fractures, L3 superior endplate fracture - Per neurosurgeon opinion, pain most likely coming from T12 fracture - Dr. Ellene Route recommendation is to proceed with kyphoplasty by interventional radiology with no more vigorous surgical intervention suggested at this time - IR kyphoplasty on 05/05/14

## 2014-05-12 NOTE — Progress Notes (Signed)
Subjective:     HPI  Pt is here w/Mike, her son-in-law  Post-hosp f/up:  Admit date: 05/02/2014 Discharge date: 05/05/2014  Recommendations for Outpatient Follow-up:  1. Pt will need to follow up with PCP in 1-2 weeks post discharge 2. Please obtain BMP and CBC in 1-2 weeks  Discharge Diagnoses:   Left-sided low back pain with sciatica and multiple Lumbar compression fractures - Patient with significant lumbar spondylosis with radicular pain, acute on chronicT12 compression fractures, L3 superior endplate fracture - Per neurosurgeon opinion, pain most likely coming from T12 fracture - Dr. Ellene Route recommendation is to proceed with kyphoplasty by interventional radiology with no more vigorous surgical intervention suggested at this time - IR kyphoplasty on 05/05/14 - continue opioids -OT and PT evaluated the patient after kyphoplasty and recommended home health PT and OT Deconditioning - Secondary to the above - IR consult placed -PT/OT Evaluation-->recommended home health PT/OT which was set up with the assistance of care management  Active Problems: CKD stage IV, GFR 15-30 - Baseline creatinine 1.9-2.3 - IV fluids have been provided - Patient tolerating diet well, stop IV fluids and resume patient's home medical regimen with Lasix - Serum creatinine remained within patient's baseline throughout the hospitalization  Essential hypertension - Continue Lasix and metoprolol the patient takes at home  Coronary atherosclerosis - Status post CABG - Blood pressure control, continue statin and ASA  Anemia in chronic renal disease - Drop in hemoglobin since admission likely dilutional from IV fluids that patient has received -after initial drop, Hgb remained stable thereafter - No signs of active bleeding -Baseline hemoglobin 10-11  Hypothyroidism - Continue Synthroid -TSH 2.24, free T4 1.44   Hyperlipidemia - Continue statin  Obesity, overweight  - Body mass index  is 33.3  Leukocytosis - improved - due to stress demargination - urine culture notable for staph coag-negative species, 55 K - Patient is afebrile, WBC within normal limits this morning, patient with no urinary concerns - We'll hold off on antibiotics for now with very low threshold for initiating if patient develops fever or WBC goes up Constipation -start colace and senna -no BM in one week DVT prophylaxis - heparin SQ  Code Status: Full.  Family Communication: plan of care discussed with the patient and daughters at bedside  Discharge Condition: stable  Disposition: home  Diet:heart heatlhy Wt Readings from Last 3 Encounters:  05/02/14 82.6 kg (182 lb 1.6 oz)  05/02/14 82.555 kg (182 lb)  04/18/14 82.555 kg (182 lb)    History of present illness:  81 y.o. female with known CAD, status post CABG at [facility] in 2015, CKD HTN, chronic low back pain, anemia from CKD, presented to [facility] emergency department with main concern of several days duration of progressively worsening left lower back pain, constant and throbbing, occasionally sharp and stabbing, radiating to the central back area and occasionally to left lower extremity, 10/10 in severity, worse with movement and with no specific alleviating factors. Subsequently she became unable to ambulate due to pain. Patient denies any fevers, no recent traumas to the area, no numbness or tingling.  In emergency department, patient noted to be hemodynamically stable, vital signs stable, blood work notable for WBC 11.9, Cr 2.22. MRI of the lumbar spine consistent with multilevel chronic compression fractures. Neurosurgery has been consulted and [facility] asked to admit for further evaluation. Neurosurgery did not feel that the patient was a good surgical candidate. They recommended kyphoplasty by interventional radiology. The patient's pain  was controlled with oral oxycodone although she remained quite deconditioned and  had significant pain with movement. Unfortunately, the patient's kyphoplasty was scheduled on 05/05/2014. The patient remained under observation status per her care management and utilization review's recommendations. The patient's family did not feel that the patient was stable to be able to take home. The patient did require significant assistance for activities of daily living. As a result, the patient stayed until the day of the kyphoplasty on 05/05/2014. She was evaluated by OT and physical therapy after kyphoplasty and they recommended home health PT and OT which was set up with the assistance of care management.  Consultants: IR Neurosurgery--Elsner  Discharge Exam: Filed Vitals:   05/05/14 1418  BP: 165/86  Pulse: 94  Temp: 98.6 F (37 C)  Resp: 16   Filed Vitals:   05/05/14 1203 05/05/14 1256 05/05/14 1257 05/05/14 1418  BP: 131/64 130/57  165/86  Pulse: 90 87  94  Temp: 97.7 F (36.5 C) 98.1 F (36.7 C)  98.6 F (37 C)  TempSrc: Oral Oral  Oral  Resp: 16 14  16   Height:      Weight:      SpO2: 92% 89% 97% 99%   General: A&O x 3, NAD, pleasant, cooperative Cardiovascular: RRR, no rub, no gallop, no S3 Respiratory: Bibasilar crackles. No wheezing. Good air movement. Abdomen:soft, nontender, nondistended, positive bowel sounds Extremities: No edema, No lymphangitis, no petechiae  Discharge Instructions     Medication List    STOP taking these medications       mirtazapine 15 MG tablet  Commonly known as: REMERON     omeprazole 20 MG capsule  Commonly known as: PRILOSEC     potassium chloride 10 MEQ tablet  Commonly known as: KLOR-CON 10     predniSONE 10 MG tablet  Commonly known as: DELTASONE      TAKE these medications       acetaminophen 650 MG CR tablet  Commonly known as: TYLENOL  Take 325 mg by mouth every 8 (eight) hours as needed for pain  (arthritis).     albuterol (5 MG/ML) 0.5% nebulizer solution  Commonly known as: PROVENTIL  Take 2.5 mg by nebulization every 6 (six) hours as needed for wheezing or shortness of breath.     albuterol 108 (90 BASE) MCG/ACT inhaler  Commonly known as: PROAIR HFA  Inhale 2 puffs into the lungs every 4 (four) hours as needed.     aspirin 325 MG EC tablet  Take 325 mg by mouth daily.     atorvastatin 40 MG tablet  Commonly known as: LIPITOR  Take 1 tablet (40 mg total) by mouth daily.     b complex vitamins capsule  Take 1 capsule by mouth daily.     Biotin 5000 MCG Caps  Take 1 capsule by mouth daily.     Cholecalciferol 1000 UNITS tablet  Commonly known as: EQL VITAMIN D3  Take 1 tablet (1,000 Units total) by mouth daily.     darbepoetin 100 MCG/0.5ML Soln injection  Commonly known as: ARANESP  Inject 100 mcg into the skin every 8 (eight) weeks.     furosemide 20 MG tablet  Commonly known as: LASIX  Take 1 tablet (20 mg total) by mouth daily.     levothyroxine 50 MCG tablet  Commonly known as: SYNTHROID, LEVOTHROID  Take 1 tablet (50 mcg total) by mouth daily before breakfast.     LORazepam 0.5 MG tablet  Commonly known as:  ATIVAN  Take 1 tablet (0.5 mg total) by mouth every 6 (six) hours as needed for anxiety.     metoprolol tartrate 25 MG tablet  Commonly known as: LOPRESSOR  Take 0.5 tablets (12.5 mg total) by mouth 2 (two) times daily.     oxyCODONE 5 MG immediate release tablet  Commonly known as: Oxy IR/ROXICODONE  Take 1 tablet (5 mg total) by mouth every 4 (four) hours as needed for moderate pain.     sennosides-docusate sodium 8.6-50 MG tablet  Commonly known as: SENOKOT-S  Take 1 tablet by mouth daily.          C/o pains all over  Had an MI/CABG in Feb 2015 Hospitalized at [facility] for syncope in March Subsequently had chest pain and cath done Had CABG with Dr Clementeen Graham  SVG  d RCA, SVG OM1, distal circumflex and LIMA LAD  Had brief periop afib Rx with amidarone and left hospital in [place] Back home from [place] 2 wks ago. In PT at home now 5/15). The patient presents for a follow-up of  chronic hypertension, chronic dyslipidemia, anemia, CRI, vit D def  Wt Readings from Last 3 Encounters:  05/12/14 176 lb (79.833 kg)  05/02/14 182 lb 1.6 oz (82.6 kg)  05/02/14 182 lb (82.555 kg)   BP Readings from Last 3 Encounters:  05/12/14 110/58  05/05/14 122/60  04/18/14 122/68     Review of Systems  Constitutional: Negative for chills, activity change, appetite change, fatigue and unexpected weight change.  HENT: Negative for congestion, mouth sores and sinus pressure.   Eyes: Negative for visual disturbance.  Respiratory: Negative for cough and chest tightness.   Cardiovascular: Positive for leg swelling.  Gastrointestinal: Negative for nausea and abdominal pain.  Genitourinary: Negative for frequency, difficulty urinating and vaginal pain.  Musculoskeletal: Negative for back pain and gait problem.       Cramps  Skin: Negative for pallor and rash.  Neurological: Negative for dizziness, tremors and weakness.  Psychiatric/Behavioral: Positive for sleep disturbance. Negative for suicidal ideas and confusion. The patient is nervous/anxious.        Objective:   Physical Exam  Constitutional: She appears well-developed. No distress.  HENT:  Head: Normocephalic.  Right Ear: External ear normal.  Left Ear: External ear normal.  Nose: Nose normal.  Mouth/Throat: Oropharynx is clear and moist.  Eyes: Conjunctivae are normal. Pupils are equal, round, and reactive to light. Right eye exhibits no discharge. Left eye exhibits no discharge.  Neck: Normal range of motion. Neck supple. No JVD present. No tracheal deviation present. No thyromegaly present.  Cardiovascular: Normal rate, regular rhythm and normal heart sounds.   Pulmonary/Chest: No stridor. No  respiratory distress. She has no wheezes.  Abdominal: Soft. Bowel sounds are normal. She exhibits no distension and no mass. There is no tenderness. There is no rebound and no guarding.  Musculoskeletal: She exhibits no edema or tenderness.  Lymphadenopathy:    She has no cervical adenopathy.  Neurological: She displays normal reflexes. No cranial nerve deficit. She exhibits normal muscle tone. Coordination normal.  Skin: No rash noted. No erythema.  Psychiatric: She has a normal mood and affect. Her behavior is normal. Judgment and thought content normal.  Walker L knee is in a brace - better L hip w/nl ROM Knee extensors on L knee 5-/5 Using a walker   Lab Results  Component Value Date   WBC 7.2 05/04/2014   HGB 10.6* 05/04/2014   HCT 32.6* 05/04/2014  PLT 378 05/04/2014   GLUCOSE 94 05/05/2014   CHOL 92 04/15/2013   TRIG 62 04/15/2013   HDL 40 04/15/2013   LDLCALC 30 04/15/2013   ALT 16 05/03/2014   AST 21 05/03/2014   NA 137 05/05/2014   K 4.4 05/05/2014   CL 100 05/05/2014   CREATININE 2.38* 05/05/2014   BUN 32* 05/05/2014   CO2 28 05/05/2014   TSH 2.214 05/02/2014   INR 0.98 05/03/2014        Assessment & Plan:

## 2014-05-12 NOTE — Assessment & Plan Note (Signed)
acute on chronicT12 compression fractures, L3 superior endplate fracture - Per neurosurgeon opinion, pain most likely coming from T12 fracture - Dr. Ellene Route recommendation is to proceed with kyphoplasty by interventional radiology with no more vigorous surgical intervention suggested at this time - IR kyphoplasty on 05/05/14  Oxycodone prn  Potential benefits of a long term opioids use as well as potential risks (i.e. addiction risk, apnea etc) and complications (i.e. Somnolence, constipation and others) were explained to the patient and were aknowledged.

## 2014-05-12 NOTE — Progress Notes (Signed)
Pre visit review using our clinic review tool, if applicable. No additional management support is needed unless otherwise documented below in the visit note. 

## 2014-05-12 NOTE — Patient Instructions (Addendum)
Use Senakot for constipation as needed Hold Lipitor for 1 week to see if pains are better

## 2014-05-12 NOTE — Assessment & Plan Note (Signed)
Chronic  Lopressor, Furosemide

## 2014-05-12 NOTE — Assessment & Plan Note (Signed)
ASA, Atenolol

## 2014-05-13 DIAGNOSIS — M5432 Sciatica, left side: Secondary | ICD-10-CM | POA: Diagnosis not present

## 2014-05-13 DIAGNOSIS — M4856XD Collapsed vertebra, not elsewhere classified, lumbar region, subsequent encounter for fracture with routine healing: Secondary | ICD-10-CM | POA: Diagnosis not present

## 2014-05-13 DIAGNOSIS — M4854XD Collapsed vertebra, not elsewhere classified, thoracic region, subsequent encounter for fracture with routine healing: Secondary | ICD-10-CM | POA: Diagnosis not present

## 2014-05-13 DIAGNOSIS — M4726 Other spondylosis with radiculopathy, lumbar region: Secondary | ICD-10-CM | POA: Diagnosis not present

## 2014-05-13 DIAGNOSIS — I129 Hypertensive chronic kidney disease with stage 1 through stage 4 chronic kidney disease, or unspecified chronic kidney disease: Secondary | ICD-10-CM | POA: Diagnosis not present

## 2014-05-13 DIAGNOSIS — Z4789 Encounter for other orthopedic aftercare: Secondary | ICD-10-CM | POA: Diagnosis not present

## 2014-05-16 DIAGNOSIS — M4726 Other spondylosis with radiculopathy, lumbar region: Secondary | ICD-10-CM | POA: Diagnosis not present

## 2014-05-16 DIAGNOSIS — I129 Hypertensive chronic kidney disease with stage 1 through stage 4 chronic kidney disease, or unspecified chronic kidney disease: Secondary | ICD-10-CM | POA: Diagnosis not present

## 2014-05-16 DIAGNOSIS — M4854XD Collapsed vertebra, not elsewhere classified, thoracic region, subsequent encounter for fracture with routine healing: Secondary | ICD-10-CM | POA: Diagnosis not present

## 2014-05-16 DIAGNOSIS — M5432 Sciatica, left side: Secondary | ICD-10-CM | POA: Diagnosis not present

## 2014-05-16 DIAGNOSIS — Z4789 Encounter for other orthopedic aftercare: Secondary | ICD-10-CM | POA: Diagnosis not present

## 2014-05-16 DIAGNOSIS — M4856XD Collapsed vertebra, not elsewhere classified, lumbar region, subsequent encounter for fracture with routine healing: Secondary | ICD-10-CM | POA: Diagnosis not present

## 2014-05-17 DIAGNOSIS — M4726 Other spondylosis with radiculopathy, lumbar region: Secondary | ICD-10-CM | POA: Diagnosis not present

## 2014-05-17 DIAGNOSIS — I129 Hypertensive chronic kidney disease with stage 1 through stage 4 chronic kidney disease, or unspecified chronic kidney disease: Secondary | ICD-10-CM | POA: Diagnosis not present

## 2014-05-17 DIAGNOSIS — Z4789 Encounter for other orthopedic aftercare: Secondary | ICD-10-CM | POA: Diagnosis not present

## 2014-05-17 DIAGNOSIS — M4856XD Collapsed vertebra, not elsewhere classified, lumbar region, subsequent encounter for fracture with routine healing: Secondary | ICD-10-CM | POA: Diagnosis not present

## 2014-05-17 DIAGNOSIS — M5432 Sciatica, left side: Secondary | ICD-10-CM | POA: Diagnosis not present

## 2014-05-17 DIAGNOSIS — M4854XD Collapsed vertebra, not elsewhere classified, thoracic region, subsequent encounter for fracture with routine healing: Secondary | ICD-10-CM | POA: Diagnosis not present

## 2014-05-18 DIAGNOSIS — M4856XD Collapsed vertebra, not elsewhere classified, lumbar region, subsequent encounter for fracture with routine healing: Secondary | ICD-10-CM | POA: Diagnosis not present

## 2014-05-18 DIAGNOSIS — I129 Hypertensive chronic kidney disease with stage 1 through stage 4 chronic kidney disease, or unspecified chronic kidney disease: Secondary | ICD-10-CM | POA: Diagnosis not present

## 2014-05-18 DIAGNOSIS — Z4789 Encounter for other orthopedic aftercare: Secondary | ICD-10-CM | POA: Diagnosis not present

## 2014-05-18 DIAGNOSIS — M5432 Sciatica, left side: Secondary | ICD-10-CM | POA: Diagnosis not present

## 2014-05-18 DIAGNOSIS — M4854XD Collapsed vertebra, not elsewhere classified, thoracic region, subsequent encounter for fracture with routine healing: Secondary | ICD-10-CM | POA: Diagnosis not present

## 2014-05-18 DIAGNOSIS — M4726 Other spondylosis with radiculopathy, lumbar region: Secondary | ICD-10-CM | POA: Diagnosis not present

## 2014-05-19 DIAGNOSIS — M4726 Other spondylosis with radiculopathy, lumbar region: Secondary | ICD-10-CM | POA: Diagnosis not present

## 2014-05-19 DIAGNOSIS — M4854XD Collapsed vertebra, not elsewhere classified, thoracic region, subsequent encounter for fracture with routine healing: Secondary | ICD-10-CM | POA: Diagnosis not present

## 2014-05-19 DIAGNOSIS — M4856XD Collapsed vertebra, not elsewhere classified, lumbar region, subsequent encounter for fracture with routine healing: Secondary | ICD-10-CM | POA: Diagnosis not present

## 2014-05-19 DIAGNOSIS — Z4789 Encounter for other orthopedic aftercare: Secondary | ICD-10-CM | POA: Diagnosis not present

## 2014-05-19 DIAGNOSIS — M5432 Sciatica, left side: Secondary | ICD-10-CM | POA: Diagnosis not present

## 2014-05-19 DIAGNOSIS — I129 Hypertensive chronic kidney disease with stage 1 through stage 4 chronic kidney disease, or unspecified chronic kidney disease: Secondary | ICD-10-CM | POA: Diagnosis not present

## 2014-05-23 DIAGNOSIS — M4856XD Collapsed vertebra, not elsewhere classified, lumbar region, subsequent encounter for fracture with routine healing: Secondary | ICD-10-CM | POA: Diagnosis not present

## 2014-05-23 DIAGNOSIS — I129 Hypertensive chronic kidney disease with stage 1 through stage 4 chronic kidney disease, or unspecified chronic kidney disease: Secondary | ICD-10-CM | POA: Diagnosis not present

## 2014-05-23 DIAGNOSIS — M5432 Sciatica, left side: Secondary | ICD-10-CM | POA: Diagnosis not present

## 2014-05-23 DIAGNOSIS — M4726 Other spondylosis with radiculopathy, lumbar region: Secondary | ICD-10-CM | POA: Diagnosis not present

## 2014-05-23 DIAGNOSIS — M4854XD Collapsed vertebra, not elsewhere classified, thoracic region, subsequent encounter for fracture with routine healing: Secondary | ICD-10-CM | POA: Diagnosis not present

## 2014-05-23 DIAGNOSIS — Z4789 Encounter for other orthopedic aftercare: Secondary | ICD-10-CM | POA: Diagnosis not present

## 2014-05-25 DIAGNOSIS — M4854XD Collapsed vertebra, not elsewhere classified, thoracic region, subsequent encounter for fracture with routine healing: Secondary | ICD-10-CM | POA: Diagnosis not present

## 2014-05-25 DIAGNOSIS — Z4789 Encounter for other orthopedic aftercare: Secondary | ICD-10-CM | POA: Diagnosis not present

## 2014-05-25 DIAGNOSIS — M4856XD Collapsed vertebra, not elsewhere classified, lumbar region, subsequent encounter for fracture with routine healing: Secondary | ICD-10-CM | POA: Diagnosis not present

## 2014-05-25 DIAGNOSIS — M4726 Other spondylosis with radiculopathy, lumbar region: Secondary | ICD-10-CM | POA: Diagnosis not present

## 2014-05-25 DIAGNOSIS — M5432 Sciatica, left side: Secondary | ICD-10-CM | POA: Diagnosis not present

## 2014-05-25 DIAGNOSIS — I129 Hypertensive chronic kidney disease with stage 1 through stage 4 chronic kidney disease, or unspecified chronic kidney disease: Secondary | ICD-10-CM | POA: Diagnosis not present

## 2014-05-26 ENCOUNTER — Other Ambulatory Visit: Payer: Self-pay | Admitting: Cardiovascular Disease

## 2014-05-26 DIAGNOSIS — M4854XD Collapsed vertebra, not elsewhere classified, thoracic region, subsequent encounter for fracture with routine healing: Secondary | ICD-10-CM | POA: Diagnosis not present

## 2014-05-26 DIAGNOSIS — M5432 Sciatica, left side: Secondary | ICD-10-CM | POA: Diagnosis not present

## 2014-05-26 DIAGNOSIS — Z4789 Encounter for other orthopedic aftercare: Secondary | ICD-10-CM | POA: Diagnosis not present

## 2014-05-26 DIAGNOSIS — M4856XD Collapsed vertebra, not elsewhere classified, lumbar region, subsequent encounter for fracture with routine healing: Secondary | ICD-10-CM | POA: Diagnosis not present

## 2014-05-26 DIAGNOSIS — I6523 Occlusion and stenosis of bilateral carotid arteries: Secondary | ICD-10-CM

## 2014-05-26 DIAGNOSIS — M4726 Other spondylosis with radiculopathy, lumbar region: Secondary | ICD-10-CM | POA: Diagnosis not present

## 2014-05-26 DIAGNOSIS — I129 Hypertensive chronic kidney disease with stage 1 through stage 4 chronic kidney disease, or unspecified chronic kidney disease: Secondary | ICD-10-CM | POA: Diagnosis not present

## 2014-05-30 ENCOUNTER — Other Ambulatory Visit: Payer: Self-pay | Admitting: Internal Medicine

## 2014-05-31 DIAGNOSIS — M5432 Sciatica, left side: Secondary | ICD-10-CM | POA: Diagnosis not present

## 2014-05-31 DIAGNOSIS — I129 Hypertensive chronic kidney disease with stage 1 through stage 4 chronic kidney disease, or unspecified chronic kidney disease: Secondary | ICD-10-CM | POA: Diagnosis not present

## 2014-05-31 DIAGNOSIS — M4726 Other spondylosis with radiculopathy, lumbar region: Secondary | ICD-10-CM | POA: Diagnosis not present

## 2014-05-31 DIAGNOSIS — Z4789 Encounter for other orthopedic aftercare: Secondary | ICD-10-CM | POA: Diagnosis not present

## 2014-05-31 DIAGNOSIS — M4854XD Collapsed vertebra, not elsewhere classified, thoracic region, subsequent encounter for fracture with routine healing: Secondary | ICD-10-CM | POA: Diagnosis not present

## 2014-05-31 DIAGNOSIS — M4856XD Collapsed vertebra, not elsewhere classified, lumbar region, subsequent encounter for fracture with routine healing: Secondary | ICD-10-CM | POA: Diagnosis not present

## 2014-06-02 DIAGNOSIS — Z4789 Encounter for other orthopedic aftercare: Secondary | ICD-10-CM | POA: Diagnosis not present

## 2014-06-02 DIAGNOSIS — M5432 Sciatica, left side: Secondary | ICD-10-CM | POA: Diagnosis not present

## 2014-06-02 DIAGNOSIS — I129 Hypertensive chronic kidney disease with stage 1 through stage 4 chronic kidney disease, or unspecified chronic kidney disease: Secondary | ICD-10-CM | POA: Diagnosis not present

## 2014-06-02 DIAGNOSIS — M4726 Other spondylosis with radiculopathy, lumbar region: Secondary | ICD-10-CM | POA: Diagnosis not present

## 2014-06-02 DIAGNOSIS — M4856XD Collapsed vertebra, not elsewhere classified, lumbar region, subsequent encounter for fracture with routine healing: Secondary | ICD-10-CM | POA: Diagnosis not present

## 2014-06-02 DIAGNOSIS — M4854XD Collapsed vertebra, not elsewhere classified, thoracic region, subsequent encounter for fracture with routine healing: Secondary | ICD-10-CM | POA: Diagnosis not present

## 2014-06-09 DIAGNOSIS — I129 Hypertensive chronic kidney disease with stage 1 through stage 4 chronic kidney disease, or unspecified chronic kidney disease: Secondary | ICD-10-CM | POA: Diagnosis not present

## 2014-06-09 DIAGNOSIS — Z4789 Encounter for other orthopedic aftercare: Secondary | ICD-10-CM | POA: Diagnosis not present

## 2014-06-09 DIAGNOSIS — M4856XD Collapsed vertebra, not elsewhere classified, lumbar region, subsequent encounter for fracture with routine healing: Secondary | ICD-10-CM | POA: Diagnosis not present

## 2014-06-09 DIAGNOSIS — M4726 Other spondylosis with radiculopathy, lumbar region: Secondary | ICD-10-CM | POA: Diagnosis not present

## 2014-06-09 DIAGNOSIS — M5432 Sciatica, left side: Secondary | ICD-10-CM | POA: Diagnosis not present

## 2014-06-09 DIAGNOSIS — M4854XD Collapsed vertebra, not elsewhere classified, thoracic region, subsequent encounter for fracture with routine healing: Secondary | ICD-10-CM | POA: Diagnosis not present

## 2014-06-13 ENCOUNTER — Ambulatory Visit (INDEPENDENT_AMBULATORY_CARE_PROVIDER_SITE_OTHER): Payer: Medicare Other | Admitting: Internal Medicine

## 2014-06-13 ENCOUNTER — Encounter: Payer: Self-pay | Admitting: Internal Medicine

## 2014-06-13 VITALS — BP 130/60 | HR 90 | Wt 175.0 lb

## 2014-06-13 DIAGNOSIS — S22080G Wedge compression fracture of T11-T12 vertebra, subsequent encounter for fracture with delayed healing: Secondary | ICD-10-CM

## 2014-06-13 DIAGNOSIS — M4854XG Collapsed vertebra, not elsewhere classified, thoracic region, subsequent encounter for fracture with delayed healing: Secondary | ICD-10-CM

## 2014-06-13 DIAGNOSIS — I2510 Atherosclerotic heart disease of native coronary artery without angina pectoris: Secondary | ICD-10-CM

## 2014-06-13 DIAGNOSIS — M4854XS Collapsed vertebra, not elsewhere classified, thoracic region, sequela of fracture: Secondary | ICD-10-CM

## 2014-06-13 DIAGNOSIS — I10 Essential (primary) hypertension: Secondary | ICD-10-CM

## 2014-06-13 DIAGNOSIS — I6529 Occlusion and stenosis of unspecified carotid artery: Secondary | ICD-10-CM | POA: Diagnosis not present

## 2014-06-13 DIAGNOSIS — I480 Paroxysmal atrial fibrillation: Secondary | ICD-10-CM

## 2014-06-13 DIAGNOSIS — S22080S Wedge compression fracture of T11-T12 vertebra, sequela: Secondary | ICD-10-CM

## 2014-06-13 NOTE — Progress Notes (Signed)
   Subjective:     HPI  F/u LBP, compr fx - doing well s/p T12 kyphoplasty C/o pains all over  Had an MI/CABG in Feb 2015 Hospitalized at [facility] for syncope in March Subsequently had chest pain and cath done Had CABG with Dr Clementeen Graham  SVG d RCA, SVG OM1, distal circumflex and LIMA LAD  Had brief periop afib Rx with amidarone and left hospital in [place] Back home from [place] 2 wks ago. In PT at home now 5/15). The patient presents for a follow-up of  chronic hypertension, chronic dyslipidemia, anemia, CRI, vit D def  Wt Readings from Last 3 Encounters:  06/13/14 175 lb (79.379 kg)  05/12/14 176 lb (79.833 kg)  05/02/14 182 lb 1.6 oz (82.6 kg)   BP Readings from Last 3 Encounters:  06/13/14 130/60  05/12/14 110/58  05/05/14 122/60     Review of Systems  Constitutional: Negative for chills, activity change, appetite change, fatigue and unexpected weight change.  HENT: Negative for congestion, mouth sores and sinus pressure.   Eyes: Negative for visual disturbance.  Respiratory: Negative for cough and chest tightness.   Cardiovascular: Positive for leg swelling.  Gastrointestinal: Negative for nausea and abdominal pain.  Genitourinary: Negative for frequency, difficulty urinating and vaginal pain.  Musculoskeletal: Negative for back pain and gait problem.       Cramps  Skin: Negative for pallor and rash.  Neurological: Negative for dizziness, tremors and weakness.  Psychiatric/Behavioral: Positive for sleep disturbance. Negative for suicidal ideas and confusion. The patient is nervous/anxious.        Objective:   Physical Exam  Constitutional: She appears well-developed. No distress.  HENT:  Head: Normocephalic.  Right Ear: External ear normal.  Left Ear: External ear normal.  Nose: Nose normal.  Mouth/Throat: Oropharynx is clear and moist.  Eyes: Conjunctivae are normal. Pupils are equal, round, and reactive to light. Right eye exhibits no discharge. Left eye  exhibits no discharge.  Neck: Normal range of motion. Neck supple. No JVD present. No tracheal deviation present. No thyromegaly present.  Cardiovascular: Normal rate, regular rhythm and normal heart sounds.   Pulmonary/Chest: No stridor. No respiratory distress. She has no wheezes.  Abdominal: Soft. Bowel sounds are normal. She exhibits no distension and no mass. There is no tenderness. There is no rebound and no guarding.  Musculoskeletal: She exhibits no edema or tenderness.  Lymphadenopathy:    She has no cervical adenopathy.  Neurological: She displays normal reflexes. No cranial nerve deficit. She exhibits normal muscle tone. Coordination normal.  Skin: No rash noted. No erythema.  Psychiatric: She has a normal mood and affect. Her behavior is normal. Judgment and thought content normal.  Walker L knee is in a brace - better L hip w/nl ROM Knee extensors on L knee 5-/5 Using a walker   Lab Results  Component Value Date   WBC 7.2 05/04/2014   HGB 10.6* 05/04/2014   HCT 32.6* 05/04/2014   PLT 378 05/04/2014   GLUCOSE 94 05/05/2014   CHOL 92 04/15/2013   TRIG 62 04/15/2013   HDL 40 04/15/2013   LDLCALC 30 04/15/2013   ALT 16 05/03/2014   AST 21 05/03/2014   NA 137 05/05/2014   K 4.4 05/05/2014   CL 100 05/05/2014   CREATININE 2.38* 05/05/2014   BUN 32* 05/05/2014   CO2 28 05/05/2014   TSH 2.214 05/02/2014   INR 0.98 05/03/2014        Assessment & Plan:

## 2014-06-13 NOTE — Assessment & Plan Note (Signed)
LBP, compr fx - doing well s/p T12 kyphoplasty

## 2014-06-13 NOTE — Progress Notes (Signed)
Pre visit review using our clinic review tool, if applicable. No additional management support is needed unless otherwise documented below in the visit note. 

## 2014-06-13 NOTE — Assessment & Plan Note (Signed)
Lopressor, Furosemide 

## 2014-06-13 NOTE — Assessment & Plan Note (Signed)
Developed Afib RVR on POD#4(CABG 03/25/13) 03/28/13-Amiodarone started and she was converted to NSR. On ASA

## 2014-06-13 NOTE — Assessment & Plan Note (Signed)
S/p CABG Chronic  On Lipitor, ASA

## 2014-06-14 ENCOUNTER — Telehealth: Payer: Self-pay | Admitting: Internal Medicine

## 2014-06-14 NOTE — Telephone Encounter (Signed)
Please call patient inst interference to her medciation list

## 2014-06-15 NOTE — Telephone Encounter (Signed)
I called pt- she states she is no longer using Albuterol in a nebulizer, Klor Con 10 meq was d/c by PCP. She wants them removed.

## 2014-06-15 NOTE — Telephone Encounter (Signed)
Ok Done Thx 

## 2014-06-16 DIAGNOSIS — M4856XD Collapsed vertebra, not elsewhere classified, lumbar region, subsequent encounter for fracture with routine healing: Secondary | ICD-10-CM | POA: Diagnosis not present

## 2014-06-16 DIAGNOSIS — M5432 Sciatica, left side: Secondary | ICD-10-CM | POA: Diagnosis not present

## 2014-06-16 DIAGNOSIS — M4854XD Collapsed vertebra, not elsewhere classified, thoracic region, subsequent encounter for fracture with routine healing: Secondary | ICD-10-CM | POA: Diagnosis not present

## 2014-06-16 DIAGNOSIS — M4726 Other spondylosis with radiculopathy, lumbar region: Secondary | ICD-10-CM | POA: Diagnosis not present

## 2014-06-16 DIAGNOSIS — Z4789 Encounter for other orthopedic aftercare: Secondary | ICD-10-CM | POA: Diagnosis not present

## 2014-06-16 DIAGNOSIS — I129 Hypertensive chronic kidney disease with stage 1 through stage 4 chronic kidney disease, or unspecified chronic kidney disease: Secondary | ICD-10-CM | POA: Diagnosis not present

## 2014-06-23 DIAGNOSIS — M4856XD Collapsed vertebra, not elsewhere classified, lumbar region, subsequent encounter for fracture with routine healing: Secondary | ICD-10-CM | POA: Diagnosis not present

## 2014-06-23 DIAGNOSIS — Z4789 Encounter for other orthopedic aftercare: Secondary | ICD-10-CM | POA: Diagnosis not present

## 2014-06-23 DIAGNOSIS — M4854XD Collapsed vertebra, not elsewhere classified, thoracic region, subsequent encounter for fracture with routine healing: Secondary | ICD-10-CM | POA: Diagnosis not present

## 2014-06-23 DIAGNOSIS — I129 Hypertensive chronic kidney disease with stage 1 through stage 4 chronic kidney disease, or unspecified chronic kidney disease: Secondary | ICD-10-CM | POA: Diagnosis not present

## 2014-06-23 DIAGNOSIS — M5432 Sciatica, left side: Secondary | ICD-10-CM | POA: Diagnosis not present

## 2014-06-23 DIAGNOSIS — M4726 Other spondylosis with radiculopathy, lumbar region: Secondary | ICD-10-CM | POA: Diagnosis not present

## 2014-06-27 ENCOUNTER — Telehealth: Payer: Self-pay | Admitting: Hematology

## 2014-06-27 ENCOUNTER — Other Ambulatory Visit (HOSPITAL_BASED_OUTPATIENT_CLINIC_OR_DEPARTMENT_OTHER): Payer: Medicare Other

## 2014-06-27 ENCOUNTER — Ambulatory Visit (HOSPITAL_BASED_OUTPATIENT_CLINIC_OR_DEPARTMENT_OTHER): Payer: Medicare Other

## 2014-06-27 VITALS — BP 142/67 | HR 70 | Temp 98.6°F

## 2014-06-27 DIAGNOSIS — D631 Anemia in chronic kidney disease: Secondary | ICD-10-CM

## 2014-06-27 DIAGNOSIS — N189 Chronic kidney disease, unspecified: Secondary | ICD-10-CM

## 2014-06-27 LAB — CBC WITH DIFFERENTIAL/PLATELET
BASO%: 1 % (ref 0.0–2.0)
Basophils Absolute: 0.1 10*3/uL (ref 0.0–0.1)
EOS%: 3.4 % (ref 0.0–7.0)
Eosinophils Absolute: 0.3 10*3/uL (ref 0.0–0.5)
HCT: 29.3 % — ABNORMAL LOW (ref 34.8–46.6)
HGB: 9.8 g/dL — ABNORMAL LOW (ref 11.6–15.9)
LYMPH%: 24.1 % (ref 14.0–49.7)
MCH: 29.9 pg (ref 25.1–34.0)
MCHC: 33.6 g/dL (ref 31.5–36.0)
MCV: 88.9 fL (ref 79.5–101.0)
MONO#: 0.6 10*3/uL (ref 0.1–0.9)
MONO%: 7.3 % (ref 0.0–14.0)
NEUT#: 5 10*3/uL (ref 1.5–6.5)
NEUT%: 64.2 % (ref 38.4–76.8)
Platelets: 393 10*3/uL (ref 145–400)
RBC: 3.29 10*6/uL — ABNORMAL LOW (ref 3.70–5.45)
RDW: 13.3 % (ref 11.2–14.5)
WBC: 7.9 10*3/uL (ref 3.9–10.3)
lymph#: 1.9 10*3/uL (ref 0.9–3.3)

## 2014-06-27 MED ORDER — DARBEPOETIN ALFA 200 MCG/0.4ML IJ SOSY
200.0000 ug | PREFILLED_SYRINGE | Freq: Once | INTRAMUSCULAR | Status: AC
  Administered 2014-06-27: 200 ug via SUBCUTANEOUS
  Filled 2014-06-27: qty 0.4

## 2014-06-27 MED ORDER — DARBEPOETIN ALFA-POLYSORBATE 500 MCG/ML IJ SOLN: 200.0000 ug | Freq: Once | INTRAMUSCULAR | Status: DC

## 2014-06-27 MED ORDER — DARBEPOETIN ALFA 200 MCG/ML IJ SOLN: Freq: Once | INTRAMUSCULAR | Status: DC

## 2014-06-27 NOTE — Telephone Encounter (Signed)
pt wanted to r/s appt-gave pt copy of avs 

## 2014-06-30 DIAGNOSIS — M4726 Other spondylosis with radiculopathy, lumbar region: Secondary | ICD-10-CM | POA: Diagnosis not present

## 2014-06-30 DIAGNOSIS — M5432 Sciatica, left side: Secondary | ICD-10-CM | POA: Diagnosis not present

## 2014-06-30 DIAGNOSIS — M4856XD Collapsed vertebra, not elsewhere classified, lumbar region, subsequent encounter for fracture with routine healing: Secondary | ICD-10-CM | POA: Diagnosis not present

## 2014-06-30 DIAGNOSIS — Z4789 Encounter for other orthopedic aftercare: Secondary | ICD-10-CM | POA: Diagnosis not present

## 2014-06-30 DIAGNOSIS — I129 Hypertensive chronic kidney disease with stage 1 through stage 4 chronic kidney disease, or unspecified chronic kidney disease: Secondary | ICD-10-CM | POA: Diagnosis not present

## 2014-06-30 DIAGNOSIS — M4854XD Collapsed vertebra, not elsewhere classified, thoracic region, subsequent encounter for fracture with routine healing: Secondary | ICD-10-CM | POA: Diagnosis not present

## 2014-07-01 ENCOUNTER — Ambulatory Visit: Payer: Self-pay | Admitting: Internal Medicine

## 2014-07-11 ENCOUNTER — Ambulatory Visit (HOSPITAL_COMMUNITY): Payer: Medicare Other | Attending: Cardiology

## 2014-07-11 DIAGNOSIS — I6523 Occlusion and stenosis of bilateral carotid arteries: Secondary | ICD-10-CM | POA: Insufficient documentation

## 2014-07-13 ENCOUNTER — Ambulatory Visit (HOSPITAL_BASED_OUTPATIENT_CLINIC_OR_DEPARTMENT_OTHER): Payer: Medicare Other | Admitting: Hematology

## 2014-07-13 ENCOUNTER — Telehealth: Payer: Self-pay | Admitting: Hematology

## 2014-07-13 ENCOUNTER — Other Ambulatory Visit (HOSPITAL_BASED_OUTPATIENT_CLINIC_OR_DEPARTMENT_OTHER): Payer: Medicare Other

## 2014-07-13 ENCOUNTER — Encounter: Payer: Self-pay | Admitting: Hematology

## 2014-07-13 VITALS — BP 152/63 | HR 74 | Temp 97.9°F | Resp 18 | Ht 62.0 in | Wt 173.8 lb

## 2014-07-13 DIAGNOSIS — D649 Anemia, unspecified: Secondary | ICD-10-CM

## 2014-07-13 DIAGNOSIS — D631 Anemia in chronic kidney disease: Secondary | ICD-10-CM

## 2014-07-13 DIAGNOSIS — J45909 Unspecified asthma, uncomplicated: Secondary | ICD-10-CM

## 2014-07-13 DIAGNOSIS — N189 Chronic kidney disease, unspecified: Secondary | ICD-10-CM

## 2014-07-13 LAB — CBC WITH DIFFERENTIAL/PLATELET
BASO%: 1.3 % (ref 0.0–2.0)
BASOS ABS: 0.1 10*3/uL (ref 0.0–0.1)
EOS%: 4.6 % (ref 0.0–7.0)
Eosinophils Absolute: 0.2 10*3/uL (ref 0.0–0.5)
HCT: 32.8 % — ABNORMAL LOW (ref 34.8–46.6)
HEMOGLOBIN: 10.8 g/dL — AB (ref 11.6–15.9)
LYMPH#: 1.8 10*3/uL (ref 0.9–3.3)
LYMPH%: 33.9 % (ref 14.0–49.7)
MCH: 30 pg (ref 25.1–34.0)
MCHC: 32.9 g/dL (ref 31.5–36.0)
MCV: 91.1 fL (ref 79.5–101.0)
MONO#: 0.5 10*3/uL (ref 0.1–0.9)
MONO%: 9.6 % (ref 0.0–14.0)
NEUT#: 2.7 10*3/uL (ref 1.5–6.5)
NEUT%: 50.6 % (ref 38.4–76.8)
Platelets: 407 10*3/uL — ABNORMAL HIGH (ref 145–400)
RBC: 3.61 10*6/uL — ABNORMAL LOW (ref 3.70–5.45)
RDW: 14.6 % — ABNORMAL HIGH (ref 11.2–14.5)
WBC: 5.3 10*3/uL (ref 3.9–10.3)

## 2014-07-13 NOTE — Progress Notes (Signed)
[facility] OFFICE PROGRESS NOTE DATE OF VISIT: 11/15/2013  [name] Kehr, MD [place] [address]  DIAGNOSIS: Asthma, unspecified asthma severity, uncomplicated  Anemia, unspecified anemia type - Plan: Ferritin, Iron and TIBC [facility]  Chief Complaint  Patient presents with  . Anemia  . Follow-up    CURRENT THERAPY: Aranesp 200 mcg subcutaneous every 2 months for a hemoglobin less than 11.   INTERVAL HISTORY: [name] 81 y.o. female with a history of anemia secondary to renal insufficiency is here for follow-up. She was last seen by Dr. Lona Kettle about 8 month ago. She has been getting Aranesp injection every 2 months. She is doing well overall. She had a gout flare in April 2016, resolved after steroids. She was also hospitalized for severe back pain and underwent kyphoplasty by interventional radiology, which resolved her back pain. She otherwise denies any other new symptoms, still lives independently and functioning well.   MEDICAL HISTORY: Past Medical History  Diagnosis Date  . Unspecified essential hypertension     Dr [name]  . Other and unspecified hyperlipidemia   . Unspecified disorder resulting from impaired renal function   . Diverticulosis of colon with hemorrhage   . Unspecified vitamin D deficiency   . Unspecified asthma(493.90)   . Persistent disorder of initiating or maintaining sleep   . Lumbago   . Esophageal reflux   . Diverticulosis of colon (without mention of hemorrhage)   . Depressive disorder, not elsewhere classified   . Anxiety state, unspecified   . Anemia, unspecified   . Unspecified osteomyelitis, site unspecified   . Myocardial infarction   . Thyroid disease    INTERIM HISTORY: has Vitamin D deficiency; Dyslipidemia; DEHYDRATION (VOLUME DEPLETION); Anxiety state; INSOMNIA, PERSISTENT; Essential hypertension; Coronary atherosclerosis; Bilateral carotid bruits; PERIPHERAL VASCULAR DISEASE; BRONCHITIS NOT  SPECIFIED AS ACUTE OR CHRONIC; Asthma; GERD; DIVERTICULOSIS, COLON; Diverticulosis of colon with hemorrhage; PANCREATITIS; Disorder resulting from impaired renal function; PYELONEPHRITIS; UNSPECIFIED DISORDER OF URETHRA&URINARY TRACT; LOW BACK PAIN; OSTEOMYELITIS; SYNCOPE; Fatigue; Anemia in chronic renal disease; Cramps, extremity; Nausea alone; Unspecified constipation; Unspecified hypothyroidism; Cough; A-fib; CHF (congestive heart failure); PNA (pneumonia); Thyroid nodule; Influenza due to influenza A virus; Minimal cognitive impairment; Neuropathy; Acute non-ST segment elevation myocardial infarction; Disuse syndrome; Restless leg; Left leg weakness; Fall against object; Left ankle pain; Primary localized osteoarthrosis, lower leg; Alopecia; Pes anserine bursitis; Situational depression; Decreased ambulation status; Left-sided low back pain with sciatica; CKD (chronic kidney disease) stage 4, GFR 15-29 ml/min; Back pain; Compression fracture of T12 vertebra with delayed healing; and Poor tolerance for ambulation on her problem list.    ALLERGIES:  is allergic to codeine sulfate; darvon; propoxyphene napsylate; sulfacetamide sodium-sulfur; sulfamethoxazole-trimethoprim; and zolpidem.  MEDICATIONS: has a current medication list which includes the following prescription(s): acetaminophen, aspirin, atorvastatin, b complex vitamins, biotin, cholecalciferol, darbepoetin, furosemide, levothyroxine, lorazepam, metoprolol tartrate, pantoprazole, sennosides-docusate sodium, albuterol, and polyethylene glycol powder.  SURGICAL HISTORY:  Past Surgical History  Procedure Laterality Date  . Bone marrow biopsy    . Status post right carotid enterectomy    . Partial colectomy  1995    diverticulosis of colon iwth hemorrhage.   . Coronary artery bypass graft  03/25/13    x4. CABS ON PUMP-Surgeon Jomarie Longs.    REVIEW OF SYSTEMS:   Constitutional: Denies fevers, chills; 14 lb weight lost doing recent  hospitalization but now regaining her  Eyes: Denies blurriness of vision Ears, nose, mouth, throat, and face: Denies mucositis or sore  throat Respiratory: Denies cough, dyspnea or wheezes Cardiovascular: Denies palpitation, chest discomfort or lower extremity swelling Gastrointestinal:  Denies nausea, heartburn or change in bowel habits Skin: Denies abnormal skin rashes Lymphatics: Denies new lymphadenopathy or easy bruising Neurological:Denies numbness, tingling or new weaknesses Behavioral/Psych: Mood is stable, no new changes  All other systems were reviewed with the patient and are negative.  PHYSICAL EXAMINATION: ECOG PERFORMANCE STATUS: 1  Blood pressure 152/63, pulse 74, temperature 97.9 F (36.6 C), temperature source Oral, resp. rate 18, height 5' 2" (1.575 m), weight 173 lb 12.8 oz (78.835 kg), SpO2 98 %. (178 lbs last visit).   GENERAL:alert, no distress and comfortable; elderly female who is well developed, well nourished.  SKIN: skin color, texture, turgor are normal, no rashes or significant lesions; scar on chest well healed.  EYES: normal, Conjunctiva are pink and non-injected, sclera clear OROPHARYNX:no exudate, no erythema and lips, buccal mucosa, and tongue normal  NECK: supple, thyroid normal size, non-tender, without nodularity LYMPH:  no palpable lymphadenopathy in the cervical, axillary or supraclavicular LUNGS: clear to auscultation and percussion with normal breathing effort HEART: regular rate & rhythm and no murmurs and no lower extremity edema ABDOMEN:abdomen soft, non-tender and normal bowel sounds Musculoskeletal:no cyanosis of digits and no clubbing  NEURO: alert & oriented x 3 with fluent speech, no focal motor/sensory deficits  LABORATORY DATA: CBC Latest Ref Rng 07/13/2014 06/27/2014 05/04/2014  WBC 3.9 - 10.3 10e3/uL 5.3 7.9 7.2  Hemoglobin 11.6 - 15.9 g/dL 10.8(L) 9.8(L) 10.6(L)  Hematocrit 34.8 - 46.6 % 32.8(L) 29.3(L) 32.6(L)  Platelets 145 - 400  10e3/uL 407(H) 393 378    CMP Latest Ref Rng 05/05/2014 05/04/2014 05/03/2014  Glucose 70 - 99 mg/dL 94 90 88  BUN 6 - 23 mg/dL 32(H) 35(H) 32(H)  Creatinine 0.50 - 1.10 mg/dL 2.38(H) 2.29(H) 1.91(H)  Sodium 135 - 145 mmol/L 137 141 138  Potassium 3.5 - 5.1 mmol/L 4.4 4.5 4.4  Chloride 96 - 112 mmol/L 100 104 105  CO2 19 - 32 mmol/L _0 Calcium 8.4 - 10.5 mg/dL 9.1 8.8 8.8  Total Protein 6.0 - 8.3 g/dL - - 6.0  Total Bilirubin 0.3 - 1.2 mg/dL - - 0.6  Alkaline Phos 39 - 117 U/L - - 86  AST 0 - 37 U/L - - 21  ALT 0 - 35 U/L - - 16      Studies:  No results found.   RADIOGRAPHIC STUDY No new study    ASSESSMENT: [name] [name] 81 y.o. female with a history of Asthma, unspecified asthma severity, uncomplicated  Anemia, unspecified anemia type - Plan: Ferritin, Iron and TIBC [facility]   PLAN:  1. Anemia in chronic renal disease.   -She is clinically doing well, her hemoglobin has been around 10 most of time, occasionally goes above 11. -We again reviewed the benefit and the risks of Aranesp injection, especially the risk of thrombosis, including heart attack and stroke. Given her prior history of extensive heart disease which required a quadruple bypass, and advanced age, I'll decrease her Aranesp injection to every 3 months if Hb<11.  -Her last iron was normal in 2014. I'll repeat her ferritin and iron study on next visit.  2. Chronic kidney disease.  --Avoid nephrotoxins and counseled on continued adequate hydration.  Her creatinine is 2.0 today and stable.    3. Follow-up.   --RTC in 6 months with repeat labs as outlined above.    All questions were answered. The patient  knows to call the clinic with any problems, questions or concerns. We can certainly see the patient much sooner if necessary.  I spent 15 minutes counseling the patient face to face. The total time spent in the appointment was 20 minutes.   [name]  07/13/2014

## 2014-07-13 NOTE — Telephone Encounter (Signed)
Gave avs & calendar for September & December.

## 2014-09-02 ENCOUNTER — Other Ambulatory Visit: Payer: Self-pay | Admitting: Internal Medicine

## 2014-09-14 ENCOUNTER — Encounter: Payer: Self-pay | Admitting: Internal Medicine

## 2014-09-14 ENCOUNTER — Ambulatory Visit (INDEPENDENT_AMBULATORY_CARE_PROVIDER_SITE_OTHER): Payer: Medicare Other | Admitting: Internal Medicine

## 2014-09-14 VITALS — BP 138/72 | HR 76 | Wt 174.0 lb

## 2014-09-14 DIAGNOSIS — I6529 Occlusion and stenosis of unspecified carotid artery: Secondary | ICD-10-CM | POA: Diagnosis not present

## 2014-09-14 DIAGNOSIS — E559 Vitamin D deficiency, unspecified: Secondary | ICD-10-CM

## 2014-09-14 DIAGNOSIS — I10 Essential (primary) hypertension: Secondary | ICD-10-CM | POA: Diagnosis not present

## 2014-09-14 DIAGNOSIS — M4854XG Collapsed vertebra, not elsewhere classified, thoracic region, subsequent encounter for fracture with delayed healing: Secondary | ICD-10-CM

## 2014-09-14 DIAGNOSIS — I480 Paroxysmal atrial fibrillation: Secondary | ICD-10-CM | POA: Diagnosis not present

## 2014-09-14 DIAGNOSIS — I2510 Atherosclerotic heart disease of native coronary artery without angina pectoris: Secondary | ICD-10-CM | POA: Diagnosis not present

## 2014-09-14 DIAGNOSIS — I5020 Unspecified systolic (congestive) heart failure: Secondary | ICD-10-CM | POA: Diagnosis not present

## 2014-09-14 DIAGNOSIS — S22080G Wedge compression fracture of T11-T12 vertebra, subsequent encounter for fracture with delayed healing: Secondary | ICD-10-CM

## 2014-09-14 DIAGNOSIS — M810 Age-related osteoporosis without current pathological fracture: Secondary | ICD-10-CM | POA: Insufficient documentation

## 2014-09-14 DIAGNOSIS — Z23 Encounter for immunization: Secondary | ICD-10-CM

## 2014-09-14 MED ORDER — ATORVASTATIN CALCIUM 40 MG PO TABS: 40.0000 mg | ORAL_TABLET | Freq: Every day | ORAL | Status: DC

## 2014-09-14 MED ORDER — IBANDRONATE SODIUM 150 MG PO TABS: 150.0000 mg | ORAL_TABLET | ORAL | Status: DC

## 2014-09-14 NOTE — Assessment & Plan Note (Signed)
On ASA 

## 2014-09-14 NOTE — Patient Instructions (Signed)
Osteoporosis Throughout your life, your body breaks down old bone and replaces it with new bone. As you get older, your body does not replace bone as quickly as it breaks it down. By the age of 30 years, most people begin to gradually lose bone because of the imbalance between bone loss and replacement. Some people lose more bone than others. Bone loss beyond a specified normal degree is considered osteoporosis.  Osteoporosis affects the strength and durability of your bones. The inside of the ends of your bones and your flat bones, like the bones of your pelvis, look like honeycomb, filled with tiny open spaces. As bone loss occurs, your bones become less dense. This means that the open spaces inside your bones become bigger and the walls between these spaces become thinner. This makes your bones weaker. Bones of a person with osteoporosis can become so weak that they can break (fracture) during minor accidents, such as a simple fall. CAUSES  The following factors have been associated with the development of osteoporosis:  Smoking.  Drinking more than 2 alcoholic drinks several days per week.  Long-term use of certain medicines:  Corticosteroids.  Chemotherapy medicines.  Thyroid medicines.  Antiepileptic medicines.  Gonadal hormone suppression medicine.  Immunosuppression medicine.  Being underweight.  Lack of physical activity.  Lack of exposure to the sun. This can lead to vitamin D deficiency.  Certain medical conditions:  Certain inflammatory bowel diseases, such as Crohn disease and ulcerative colitis.  Diabetes.  Hyperthyroidism.  Hyperparathyroidism. RISK FACTORS Anyone can develop osteoporosis. However, the following factors can increase your risk of developing osteoporosis:  Gender--Women are at higher risk than men.  Age--Being older than 50 years increases your risk.  Ethnicity--White and Asian people have an increased risk.  Weight --Being extremely  underweight can increase your risk of osteoporosis.  Family history of osteoporosis--Having a family member who has developed osteoporosis can increase your risk. SYMPTOMS  Usually, people with osteoporosis have no symptoms.  DIAGNOSIS  Signs during a physical exam that may prompt your caregiver to suspect osteoporosis include:  Decreased height. This is usually caused by the compression of the bones that form your spine (vertebrae) because they have weakened and become fractured.  A curving or rounding of the upper back (kyphosis). To confirm signs of osteoporosis, your caregiver may request a procedure that uses 2 low-dose X-ray beams with different levels of energy to measure your bone mineral density (dual-energy X-ray absorptiometry [DXA]). Also, your caregiver may check your level of vitamin D. TREATMENT  The goal of osteoporosis treatment is to strengthen bones in order to decrease the risk of bone fractures. There are different types of medicines available to help achieve this goal. Some of these medicines work by slowing the processes of bone loss. Some medicines work by increasing bone density. Treatment also involves making sure that your levels of calcium and vitamin D are adequate. PREVENTION  There are things you can do to help prevent osteoporosis. Adequate intake of calcium and vitamin D can help you achieve optimal bone mineral density. Regular exercise can also help, especially resistance and weight-bearing activities. If you smoke, quitting smoking is an important part of osteoporosis prevention. MAKE SURE YOU:  Understand these instructions.  Will watch your condition.  Will get help right away if you are not doing well or get worse. FOR MORE INFORMATION www.osteo.org and www.nof.org Document Released: 10/17/2004 Document Revised: 05/04/2012 Document Reviewed: 12/22/2010 [employer] Patient Information 2015 [employer]. This information is not   intended to replace advice  given to you by your health care provider. Make sure you discuss any questions you have with your health care provider.  

## 2014-09-14 NOTE — Assessment & Plan Note (Signed)
Lopressor, Furosemide 

## 2014-09-14 NOTE — Assessment & Plan Note (Signed)
On Vit D 

## 2014-09-14 NOTE — Progress Notes (Signed)
Pre visit review using our clinic review tool, if applicable. No additional management support is needed unless otherwise documented below in the visit note. 

## 2014-09-14 NOTE — Assessment & Plan Note (Signed)
Chronic w/vertebral fx Options discussed Boniva Rx

## 2014-09-14 NOTE — Assessment & Plan Note (Signed)
S/p CABG Chronic  On Lipitor, ASA

## 2014-09-14 NOTE — Assessment & Plan Note (Signed)
Doing well s/p kyphoplasty

## 2014-09-14 NOTE — Progress Notes (Signed)
Subjective:  Patient ID: [name] [name], female    DOB: 07/05/1931  Age: 81 y.o. MRN: [id number]  CC: No chief complaint on file.   HPI [name] presents for LBP, hypothyroidism,  Outpatient Prescriptions Prior to Visit  Medication Sig Dispense Refill  . albuterol (PROAIR HFA) 108 (90 BASE) MCG/ACT inhaler Inhale 2 puffs into the lungs every 4 (four) hours as needed. 18 g 1  . aspirin 325 MG EC tablet Take 325 mg by mouth daily.    [name] b complex vitamins capsule Take 1 capsule by mouth daily.    . Biotin 5000 MCG CAPS Take 1 capsule by mouth daily.    . Cholecalciferol (EQL VITAMIN D3) 1000 UNITS tablet Take 1 tablet (1,000 Units total) by mouth daily. 100 tablet 3  . furosemide (LASIX) 20 MG tablet Take 1 tablet (20 mg total) by mouth daily. 30 tablet 11  . levothyroxine (SYNTHROID, LEVOTHROID) 50 MCG tablet TAKE 1 TABLET BY MOUTH EVERY DAY BEFORE BREAKFAST 90 tablet 3  . LORazepam (ATIVAN) 0.5 MG tablet TAKE 1 TABLET BY MOUTH EVERY 6 HOURS AS NEEDED FOR ANXIETY 30 tablet 2  . metoprolol tartrate (LOPRESSOR) 25 MG tablet Take 0.5 tablets (12.5 mg total) by mouth 2 (two) times daily. 30 tablet 11  . sennosides-docusate sodium (SENOKOT-S) 8.6-50 MG tablet Take 1 tablet by mouth daily.     [name] atorvastatin (LIPITOR) 40 MG tablet TAKE 1 TABLET BY MOUTH DAILY 30 tablet 6  . acetaminophen (TYLENOL) 650 MG CR tablet Take 325 mg by mouth every 8 (eight) hours as needed for pain (arthritis).     . darbepoetin (ARANESP) 100 MCG/0.5ML SOLN injection Inject 100 mcg into the skin every 8 (eight) weeks.    . pantoprazole (PROTONIX) 40 MG tablet Take 1 tablet (40 mg total) by mouth daily. (Patient not taking: Reported on 09/14/2014) 30 tablet 11  . polyethylene glycol powder (GLYCOLAX/MIRALAX) powder Take 17 g by mouth 2 (two) times daily as needed. (Patient not taking: Reported on 09/14/2014) 500 g 1   No facility-administered medications prior to visit.    ROS Review of Systems  Constitutional:  Negative for chills, activity change, appetite change, fatigue and unexpected weight change.  HENT: Negative for congestion, mouth sores and sinus pressure.   Eyes: Negative for visual disturbance.  Respiratory: Negative for cough and chest tightness.   Gastrointestinal: Negative for nausea and abdominal pain.  Genitourinary: Negative for frequency, difficulty urinating and vaginal pain.  Musculoskeletal: Positive for back pain, arthralgias and gait problem.  Skin: Negative for pallor and rash.  Neurological: Negative for dizziness, tremors, weakness, numbness and headaches.  Hematological: Does not bruise/bleed easily.  Psychiatric/Behavioral: Negative for confusion and sleep disturbance.    Objective:  BP 138/72 mmHg  Pulse 76  Wt 174 lb (78.926 kg)  SpO2 98%  BP Readings from Last 3 Encounters:  09/14/14 138/72  07/13/14 152/63  06/27/14 142/67    Wt Readings from Last 3 Encounters:  09/14/14 174 lb (78.926 kg)  07/13/14 173 lb 12.8 oz (78.835 kg)  06/13/14 175 lb (79.379 kg)    Physical Exam  Constitutional: She appears well-developed. No distress.  HENT:  Head: Normocephalic.  Right Ear: External ear normal.  Left Ear: External ear normal.  Nose: Nose normal.  Mouth/Throat: Oropharynx is clear and moist.  Eyes: Conjunctivae are normal. Pupils are equal, round, and reactive to light. Right eye exhibits no discharge. Left eye exhibits no discharge.  Neck: Normal range of motion.  Neck supple. No JVD present. No tracheal deviation present. No thyromegaly present.  Cardiovascular: Normal rate, regular rhythm and normal heart sounds.   Pulmonary/Chest: No stridor. No respiratory distress. She has no wheezes.  Abdominal: Soft. Bowel sounds are normal. She exhibits no distension and no mass. There is no tenderness. There is no rebound and no guarding.  Musculoskeletal: She exhibits no edema or tenderness.  Lymphadenopathy:    She has no cervical adenopathy.  Neurological:  She displays normal reflexes. No cranial nerve deficit. She exhibits normal muscle tone. Coordination normal.  Skin: No rash noted. No erythema.  Psychiatric: She has a normal mood and affect. Her behavior is normal. Judgment and thought content normal.  walker LS stiff  Lab Results  Component Value Date   WBC 5.3 07/13/2014   HGB 10.8* 07/13/2014   HCT 32.8* 07/13/2014   PLT 407* 07/13/2014   GLUCOSE 94 05/05/2014   CHOL 92 04/15/2013   TRIG 62 04/15/2013   HDL 40 04/15/2013   LDLCALC 30 04/15/2013   ALT 16 05/03/2014   AST 21 05/03/2014   NA 137 05/05/2014   K 4.4 05/05/2014   CL 100 05/05/2014   CREATININE 2.38* 05/05/2014   BUN 32* 05/05/2014   CO2 28 05/05/2014   TSH 2.214 05/02/2014   INR 0.98 05/03/2014    Mr Lumbar Spine Wo Contrast  05/02/2014   CLINICAL DATA:  81 year old female with chronic lumbar back pain radiating to the left lower extremity. Unable to ambulate. Left side sciatica. Initial encounter.  EXAM: MRI LUMBAR SPINE WITHOUT CONTRAST  TECHNIQUE: Multiplanar, multisequence MR imaging of the lumbar spine was performed. No intravenous contrast was administered.  COMPARISON:  Lumbar radiographs 08/25/2013 and earlier  FINDINGS: Normal lumbar segmentation demonstrated on the comparisons. Chronic L3, L2, and to a lesser extent L1 compression fractures. The L3 level was compressed in 2015, although there is mild endplate edema at that level and also inferiorly at L2.  Moderate T12 compression fracture also appears present on the prior studies but demonstrates mild to moderate marrow edema (series 6, image 9).  The T11, L4, and L5 levels appear intact.  Visible sacrum intact.  Mild retropulsion of bone related to the above compression deformities and confounding degenerative disc osteophyte complex in the lumbar spine. Details below.  Visualized lower thoracic spinal cord is normal with conus medularis at L1.  Nonspecific perinephric stranding. Otherwise negative visualized  abdominal viscera.  T10-T11:  Negative.  T11-T12:  Negative.  T12-L1:  Negative.  L1-L2: Disc space loss. Circumferential disc osteophyte complex. Mild to moderate facet hypertrophy. Mild spinal and moderate L1 foraminal stenosis.  L2-L3: Circumferential disc osteophyte complex. Mild facet and ligament flavum hypertrophy. Borderline to mild spinal stenosis. No significant foraminal stenosis.  L3-L4: Circumferential disc osteophyte complex. Moderate facet and ligament flavum hypertrophy. Mild spinal and L3 foraminal stenosis.  L4-L5: Disc space loss. Right eccentric circumferential disc osteophyte complex. Mild facet and ligament flavum hypertrophy. No significant spinal stenosis. Mild lateral recess and L4 foraminal stenosis.  L5-S1: Disc space loss. Circumferential disc osteophyte complex. Mild facet hypertrophy. No spinal or lateral recess stenosis. Moderate to severe L5 foraminal stenosis, primarily related to bony spurring.  IMPRESSION: 1. Multilevel chronic compression fractures. Mild to moderate marrow edema at the T12 level suggesting acute or subacute exacerbation of chronic compression. Trace marrow edema also at the L2-L3 endplates. 2. Mild retropulsion of bone at most levels exacerbating chronic disc and endplate degeneration. Up to mild degenerative spinal stenosis at L1-L2,  L2-L3 and L3-L4. Mild lateral recess stenosis at L4-L5. Moderate to severe foraminal stenosis at L5-S1.   Electronically Signed   By: Genevie Ann M.D.   On: 05/02/2014 14:13    Assessment & Plan:   Diagnoses and all orders for this visit:  Paroxysmal atrial fibrillation -     Vit D  25 hydroxy (rtn osteoporosis monitoring); Future -     Hepatic function panel; Future -     Basic metabolic panel; Future -     TSH; Future  Essential hypertension -     Vit D  25 hydroxy (rtn osteoporosis monitoring); Future -     Hepatic function panel; Future -     Basic metabolic panel; Future -     TSH; Future  Atherosclerosis of  native coronary artery of native heart without angina pectoris -     Vit D  25 hydroxy (rtn osteoporosis monitoring); Future -     Hepatic function panel; Future -     Basic metabolic panel; Future -     TSH; Future  Systolic congestive heart failure, unspecified congestive heart failure chronicity -     Vit D  25 hydroxy (rtn osteoporosis monitoring); Future -     Hepatic function panel; Future -     Basic metabolic panel; Future -     TSH; Future  Compression fracture of T12 vertebra with delayed healing -     Vit D  25 hydroxy (rtn osteoporosis monitoring); Future -     Hepatic function panel; Future -     Basic metabolic panel; Future -     TSH; Future  Vitamin D deficiency -     Vit D  25 hydroxy (rtn osteoporosis monitoring); Future -     Hepatic function panel; Future -     Basic metabolic panel; Future -     TSH; Future  Osteoporosis -     Vit D  25 hydroxy (rtn osteoporosis monitoring); Future -     Hepatic function panel; Future -     Basic metabolic panel; Future -     TSH; Future  Other orders -     atorvastatin (LIPITOR) 40 MG tablet; Take 1 tablet (40 mg total) by mouth daily. -     ibandronate (BONIVA) 150 MG tablet; Take 1 tablet (150 mg total) by mouth every 30 (thirty) days. Take in the morning with a full glass of water, on an empty stomach, and do not take anything else by mouth or lie down for the next 30 min.  I have changed Ms. Marolf's atorvastatin. I am also having her start on ibandronate. Additionally, I am having her maintain her aspirin, sennosides-docusate sodium, metoprolol tartrate, acetaminophen, b complex vitamins, Biotin, albuterol, furosemide, darbepoetin, Cholecalciferol, polyethylene glycol powder, pantoprazole, levothyroxine, and LORazepam.  Meds ordered this encounter  Medications  . atorvastatin (LIPITOR) 40 MG tablet    Sig: Take 1 tablet (40 mg total) by mouth daily.    Dispense:  90 tablet    Refill:  3  . ibandronate (BONIVA) 150  MG tablet    Sig: Take 1 tablet (150 mg total) by mouth every 30 (thirty) days. Take in the morning with a full glass of water, on an empty stomach, and do not take anything else by mouth or lie down for the next 30 min.    Dispense:  3 tablet    Refill:  3     Follow-up: Return in about 3 months (around  12/15/2014) for a follow-up visit.  [name], MD

## 2014-09-14 NOTE — Assessment & Plan Note (Signed)
Furosemide, Lopressor 

## 2014-09-14 NOTE — Addendum Note (Signed)
Addended by: Cresenciano Lick on: 09/14/2014 11:20 AM   Modules accepted: Orders

## 2014-09-18 NOTE — Progress Notes (Signed)
Patient ID: [name] [name], female   DOB: 1932/01/04, 81 y.o.   MRN: [id number]   [name] [name] returns today for F/U of HTN, palpitations and fatigue. She was hospitalized 2014 for a diverticular bleed. This seems stable and she has F/U with Dr. Earlean Shawl. She has had palpitations. Event monitor  Benign  with no significant arrythmias 2014 . She has been compliant with her BP meds. She has had a previous RCEA by Dr. Amedeo Plenty. 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 She denies SSCP, palpitations, dyspnea, PND orthopnea and edema   She is on a study at [facility] for BP And gets it followed every 3 months   Hospitalized at [facility] for syncope in March  2015 Subsequently had chest pain and cath done Had CABG  3/15 with Dr Clementeen Graham  SVG d RCA, SVG OM1, distal circumflex and LIMA LAD  Had brief periop afib Rx with amidarone and left hospital in [place]  07/11/14   60-79% left ICA stenosis  Needs f/u 12/16    Injured left knee and ankle getting cortisone shots Primary increased lipitor earlier this year    ROS: Denies fever, malais, weight loss, blurry vision, decreased visual acuity, cough, sputum, SOB, hemoptysis, pleuritic pain, palpitaitons, heartburn, abdominal pain, melena, lower extremity edema, claudication, or rash.  All other systems reviewed and negative  General: Affect appropriate Pale elderly female  HEENT: normal Neck supple with no adenopathy JVP normal  Left bruit   no thyromegaly Lungs clear with no wheezing and good diaphragmatic motion Heart:  S1/S2 no murmur, no rub, gallop or click post sternotimy PMI normal Abdomen: benighn, BS positve, no tenderness, no AAA no bruit.  No HSM or HJR Distal pulses intact with no bruits No edema Neuro non-focal Skin warm and dry No muscular weakness Brace on left knee    Current Outpatient Prescriptions  Medication Sig Dispense  Refill  . acetaminophen (TYLENOL) 650 MG CR tablet Take 325 mg by mouth every 8 (eight) hours as needed for pain (arthritis).     [name] albuterol (PROVENTIL HFA;VENTOLIN HFA) 108 (90 BASE) MCG/ACT inhaler Inhale into the lungs every 6 (six) hours as needed for wheezing or shortness of breath.    [name] aspirin 325 MG EC tablet Take 325 mg by mouth daily.    [name] atorvastatin (LIPITOR) 40 MG tablet Take 1 tablet (40 mg total) by mouth daily. 90 tablet 3  . b complex vitamins capsule Take 1 capsule by mouth daily.    . Biotin 5000 MCG CAPS Take 1 capsule by mouth daily.    . Cholecalciferol (EQL VITAMIN D3) 1000 UNITS tablet Take 1 tablet (1,000 Units total) by mouth daily. 100 tablet 3  . darbepoetin (ARANESP) 100 MCG/0.5ML SOLN injection Inject 100 mcg into the skin every 8 (eight) weeks.    . furosemide (LASIX) 20 MG tablet Take 1 tablet (20 mg total) by mouth daily. 30 tablet 11  . levothyroxine (SYNTHROID, LEVOTHROID) 50 MCG tablet TAKE 1 TABLET BY MOUTH EVERY DAY BEFORE BREAKFAST 90 tablet 3  . LORazepam (ATIVAN) 0.5 MG tablet TAKE 1 TABLET BY MOUTH EVERY 6 HOURS AS NEEDED FOR ANXIETY 30 tablet 2  . metoprolol tartrate (LOPRESSOR) 25 MG tablet Take 0.5 tablets (12.5 mg total) by mouth 2 (two) times daily. 30 tablet 11  . pantoprazole (PROTONIX) 40 MG  tablet Take 40 mg by mouth daily.    . sennosides-docusate sodium (SENOKOT-S) 8.6-50 MG tablet Take 1 tablet by mouth daily.     [name] ibandronate (BONIVA) 150 MG tablet Take 1 tablet (150 mg total) by mouth every 30 (thirty) days. Take in the morning with a full glass of water, on an empty stomach, and do not take anything else by mouth or lie down for the next 30 min. (Patient not taking: Reported on 09/19/2014) 3 tablet 3   No current facility-administered medications for this visit.    Allergies  Codeine sulfate; Darvon; Propoxyphene napsylate; Sulfacetamide sodium-sulfur; Sulfamethoxazole-trimethoprim; and Zolpidem  Electrocardiogram:   10/23/12  SR rate  75 nonspecific ST changes incorrectly read by computer as accelerated junctional  8/15  SR rate 73 normal  09/19/14  SR rate 70  nornmal  Assessment and Plan PAF:  In setting post CABG NSR asa/ lopresser no anticoagulation CAD/CABG:  2015  Babtist  SVG OM/PLB  SVG RCA  LIMA LAD  No angina continue medical Rx Carotid:  Left ICA 60-79% stenosis.  No TIA symptoms.  Continue antiplatelet Rx and F/U carotid duplex in 12/2014   December 2016  Chol:   Lab Results  Component Value Date   LDLCALC 30 04/15/2013

## 2014-09-19 ENCOUNTER — Ambulatory Visit (INDEPENDENT_AMBULATORY_CARE_PROVIDER_SITE_OTHER): Payer: Medicare Other | Admitting: Cardiovascular Disease

## 2014-09-19 ENCOUNTER — Encounter: Payer: Self-pay | Admitting: Cardiovascular Disease

## 2014-09-19 VITALS — BP 136/60 | HR 70 | Ht 62.0 in | Wt 174.4 lb

## 2014-09-19 DIAGNOSIS — I10 Essential (primary) hypertension: Secondary | ICD-10-CM

## 2014-09-19 DIAGNOSIS — I6529 Occlusion and stenosis of unspecified carotid artery: Secondary | ICD-10-CM | POA: Diagnosis not present

## 2014-09-19 NOTE — Patient Instructions (Addendum)
Medication Instructions:  NO CHANGES  Labwork: NONE Your physician has requested that you have a carotid duplex. This test is an ultrasound of the carotid arteries in your neck. It looks at blood flow through these arteries that supply the brain with blood. Allow one hour for this exam. There are no restrictions or special instructions.  DUE IN DECEMBER  Follow-Up: Your physician wants you to follow-up in: [place] will receive a reminder letter in the mail two months in advance. If you don't receive a letter, please call our office to schedule the follow-up appointment. Any Other Special Instructions Will Be Listed Below (If Applicable).

## 2014-09-21 DIAGNOSIS — D1801 Hemangioma of skin and subcutaneous tissue: Secondary | ICD-10-CM | POA: Diagnosis not present

## 2014-09-21 DIAGNOSIS — L918 Other hypertrophic disorders of the skin: Secondary | ICD-10-CM | POA: Diagnosis not present

## 2014-09-21 DIAGNOSIS — Z85828 Personal history of other malignant neoplasm of skin: Secondary | ICD-10-CM | POA: Diagnosis not present

## 2014-09-21 DIAGNOSIS — L570 Actinic keratosis: Secondary | ICD-10-CM | POA: Diagnosis not present

## 2014-09-21 DIAGNOSIS — L821 Other seborrheic keratosis: Secondary | ICD-10-CM | POA: Diagnosis not present

## 2014-09-21 DIAGNOSIS — L814 Other melanin hyperpigmentation: Secondary | ICD-10-CM | POA: Diagnosis not present

## 2014-09-28 ENCOUNTER — Other Ambulatory Visit (HOSPITAL_BASED_OUTPATIENT_CLINIC_OR_DEPARTMENT_OTHER): Payer: Medicare Other

## 2014-09-28 ENCOUNTER — Ambulatory Visit (HOSPITAL_BASED_OUTPATIENT_CLINIC_OR_DEPARTMENT_OTHER): Payer: Medicare Other

## 2014-09-28 VITALS — BP 151/63 | HR 66 | Temp 98.4°F

## 2014-09-28 DIAGNOSIS — N189 Chronic kidney disease, unspecified: Secondary | ICD-10-CM

## 2014-09-28 DIAGNOSIS — D631 Anemia in chronic kidney disease: Secondary | ICD-10-CM

## 2014-09-28 LAB — CBC WITH DIFFERENTIAL/PLATELET
BASO%: 0.8 % (ref 0.0–2.0)
BASOS ABS: 0.1 10*3/uL (ref 0.0–0.1)
EOS ABS: 0.3 10*3/uL (ref 0.0–0.5)
EOS%: 4 % (ref 0.0–7.0)
HCT: 28.4 % — ABNORMAL LOW (ref 34.8–46.6)
HGB: 9.4 g/dL — ABNORMAL LOW (ref 11.6–15.9)
LYMPH%: 26.1 % (ref 14.0–49.7)
MCH: 29.9 pg (ref 25.1–34.0)
MCHC: 33.1 g/dL (ref 31.5–36.0)
MCV: 90.4 fL (ref 79.5–101.0)
MONO#: 0.7 10*3/uL (ref 0.1–0.9)
MONO%: 10.5 % (ref 0.0–14.0)
NEUT#: 3.6 10*3/uL (ref 1.5–6.5)
NEUT%: 58.6 % (ref 38.4–76.8)
Platelets: 321 10*3/uL (ref 145–400)
RBC: 3.14 10*6/uL — AB (ref 3.70–5.45)
RDW: 13.5 % (ref 11.2–14.5)
WBC: 6.2 10*3/uL (ref 3.9–10.3)
lymph#: 1.6 10*3/uL (ref 0.9–3.3)

## 2014-09-28 MED ORDER — DARBEPOETIN ALFA-POLYSORBATE 500 MCG/ML IJ SOLN: 200.0000 ug | Freq: Once | INTRAMUSCULAR | Status: DC

## 2014-09-28 MED ORDER — DARBEPOETIN ALFA 200 MCG/0.4ML IJ SOSY
200.0000 ug | PREFILLED_SYRINGE | Freq: Once | INTRAMUSCULAR | Status: AC
  Administered 2014-09-28: 200 ug via SUBCUTANEOUS
  Filled 2014-09-28: qty 0.4

## 2014-09-29 IMAGING — CR DG ANKLE COMPLETE 3+V*L*
3 series · 3 of 3 positions shown · non-contrast
Comparison: None.

CLINICAL DATA: 82-year-old female status post fall with pain and
swelling. Initial encounter.

EXAM:
LEFT ANKLE COMPLETE - 3+ VIEW

[view not recorded (1 of 3)]
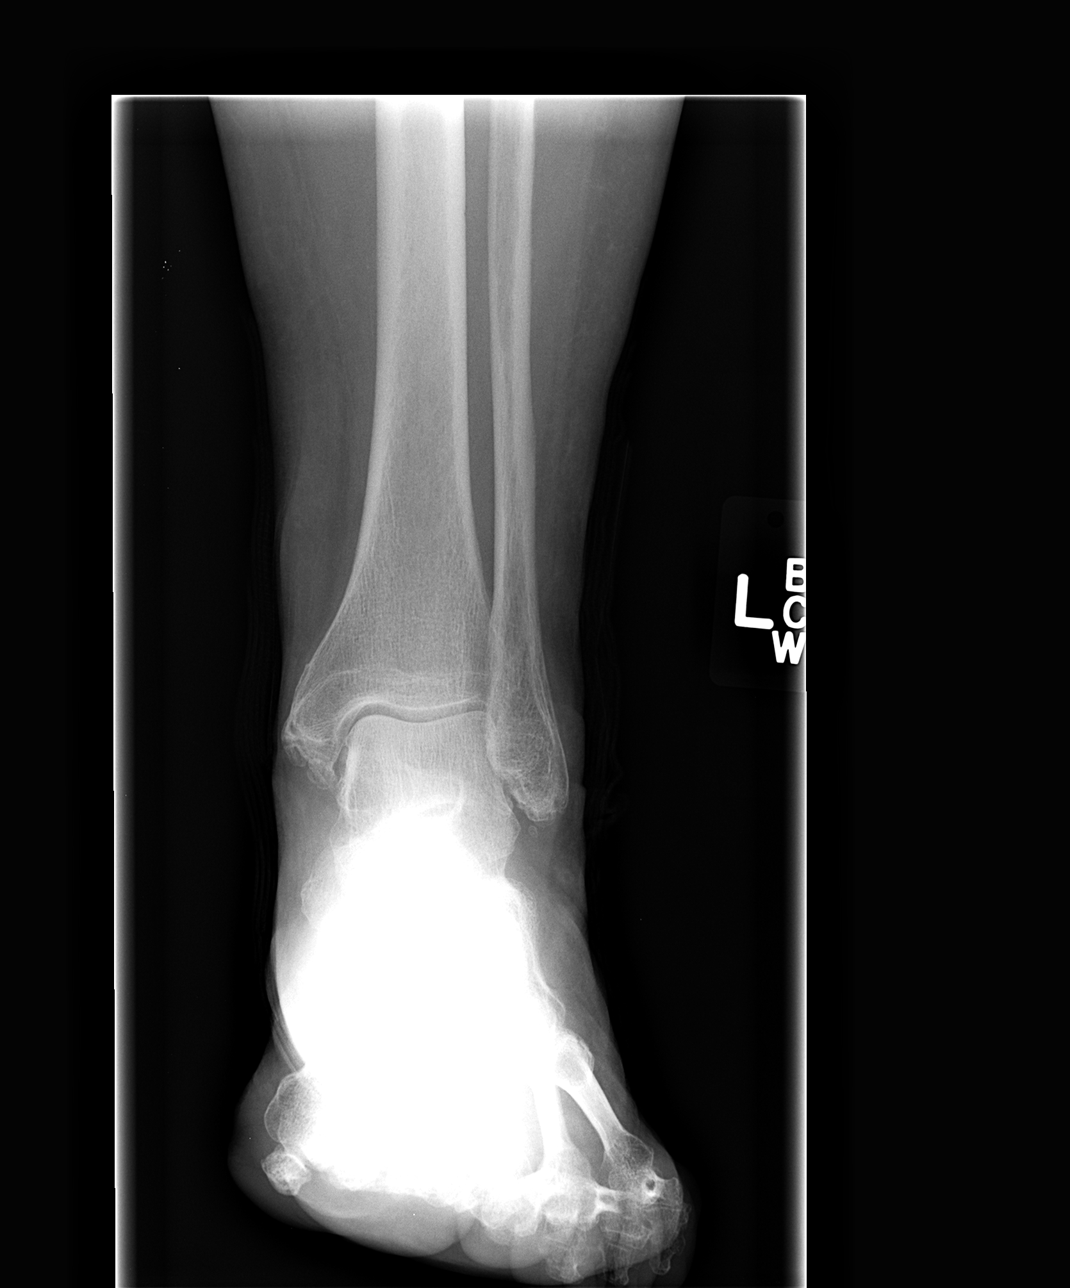

[view not recorded (2 of 3)]
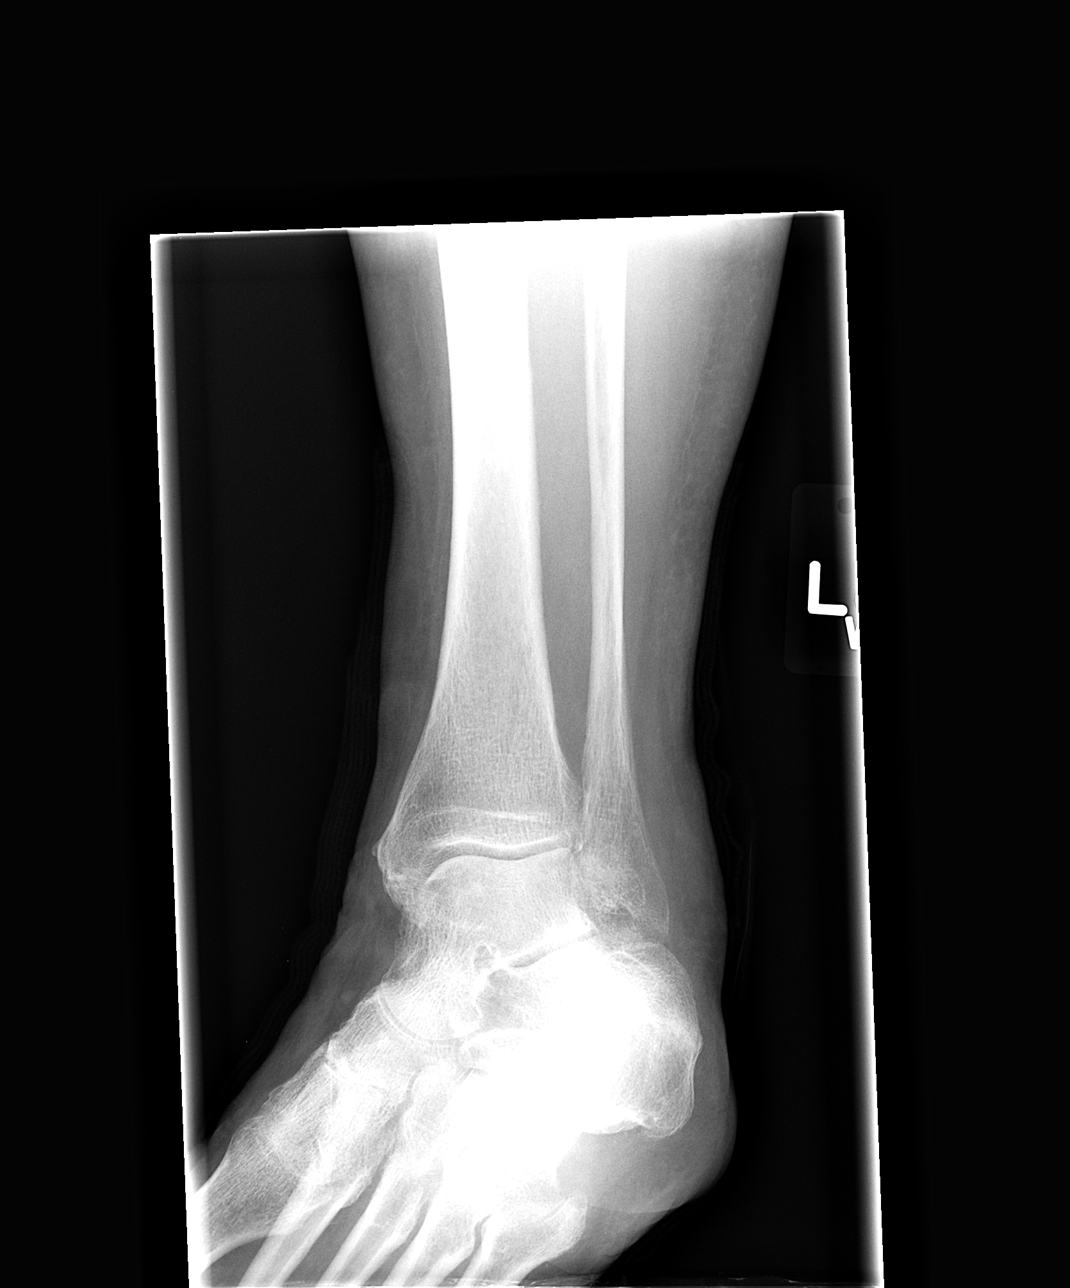

[view not recorded (3 of 3)]
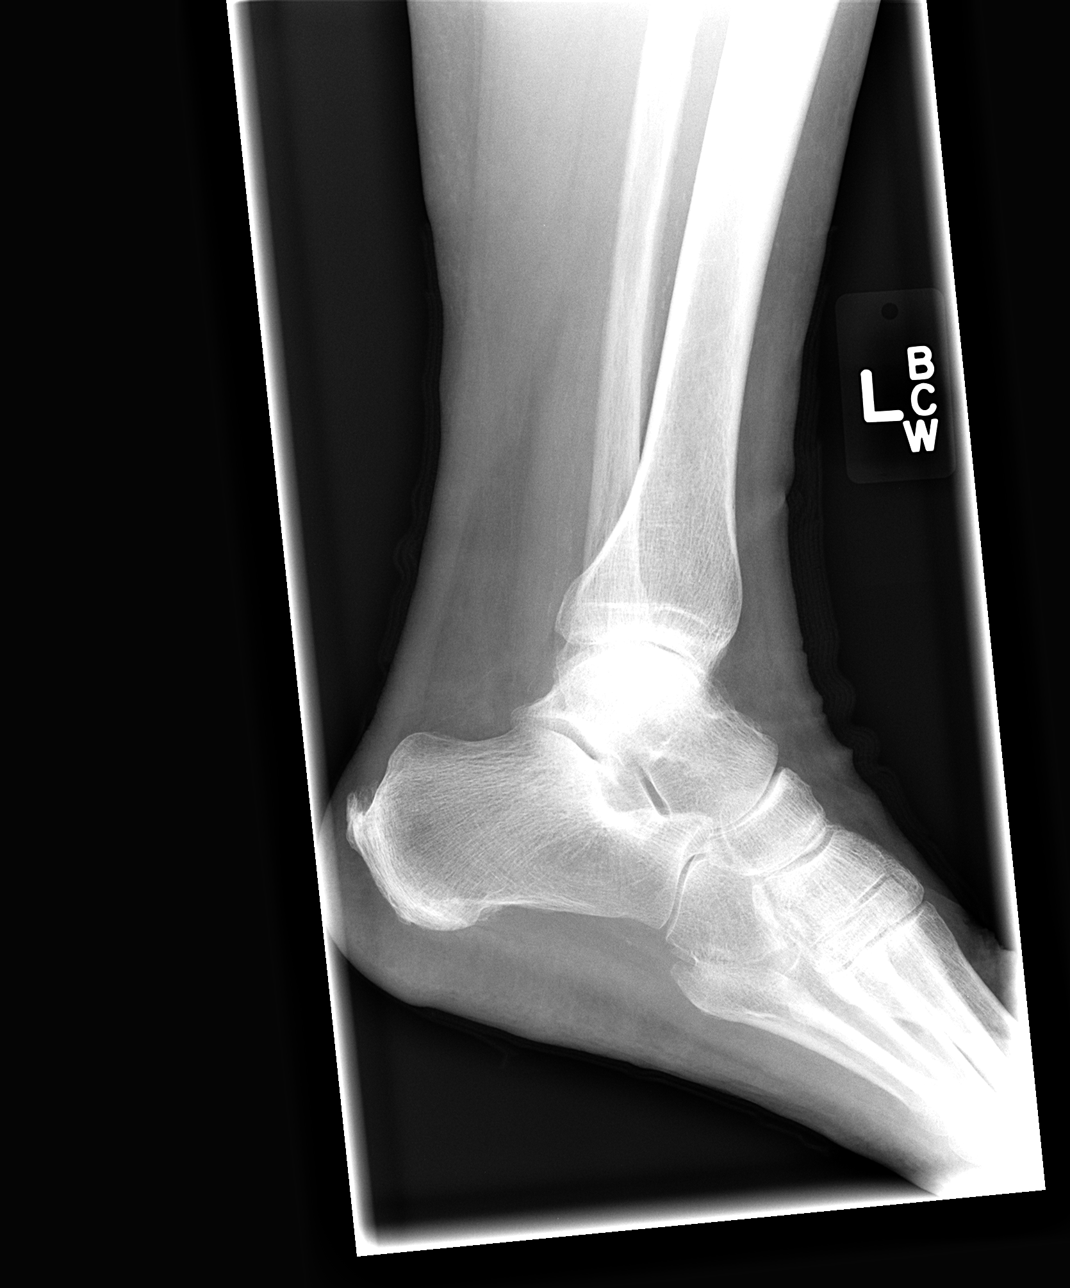

[3 of 3 positions shown; findings below may reference images not displayed]

FINDINGS: Bone mineralization is within normal limits for age. Calcaneus
appears intact. No joint effusion identified. Mortise joint
alignment preserved. Talar dome intact.

Cortical irregularity at the tips of the medial and lateral
malleoli, with small ossific fragments. A small acute avulsion
fracture cannot be excluded. No other acute fracture or dislocation
identified.

Subtle calcified atherosclerosis about the left ankle.
IMPRESSION: Small ossific fragments at the medial and lateral malleoli, may be
chronic but cannot exclude small acute avulsion fractures. No other
acute fracture or dislocation about the left ankle.

## 2014-11-23 ENCOUNTER — Ambulatory Visit (INDEPENDENT_AMBULATORY_CARE_PROVIDER_SITE_OTHER): Payer: Medicare Other | Admitting: Family Medicine

## 2014-11-23 ENCOUNTER — Other Ambulatory Visit (INDEPENDENT_AMBULATORY_CARE_PROVIDER_SITE_OTHER): Payer: Medicare Other

## 2014-11-23 ENCOUNTER — Encounter: Payer: Self-pay | Admitting: Family Medicine

## 2014-11-23 VITALS — BP 138/82 | HR 85 | Ht 62.0 in | Wt 173.0 lb

## 2014-11-23 DIAGNOSIS — E559 Vitamin D deficiency, unspecified: Secondary | ICD-10-CM | POA: Diagnosis not present

## 2014-11-23 DIAGNOSIS — M1712 Unilateral primary osteoarthritis, left knee: Secondary | ICD-10-CM | POA: Diagnosis not present

## 2014-11-23 DIAGNOSIS — I480 Paroxysmal atrial fibrillation: Secondary | ICD-10-CM

## 2014-11-23 DIAGNOSIS — I10 Essential (primary) hypertension: Secondary | ICD-10-CM | POA: Diagnosis not present

## 2014-11-23 DIAGNOSIS — M79662 Pain in left lower leg: Secondary | ICD-10-CM | POA: Diagnosis not present

## 2014-11-23 DIAGNOSIS — M4854XG Collapsed vertebra, not elsewhere classified, thoracic region, subsequent encounter for fracture with delayed healing: Secondary | ICD-10-CM

## 2014-11-23 DIAGNOSIS — I2510 Atherosclerotic heart disease of native coronary artery without angina pectoris: Secondary | ICD-10-CM | POA: Diagnosis not present

## 2014-11-23 DIAGNOSIS — I6529 Occlusion and stenosis of unspecified carotid artery: Secondary | ICD-10-CM | POA: Diagnosis not present

## 2014-11-23 DIAGNOSIS — M810 Age-related osteoporosis without current pathological fracture: Secondary | ICD-10-CM

## 2014-11-23 DIAGNOSIS — S22080G Wedge compression fracture of T11-T12 vertebra, subsequent encounter for fracture with delayed healing: Secondary | ICD-10-CM

## 2014-11-23 DIAGNOSIS — I5020 Unspecified systolic (congestive) heart failure: Secondary | ICD-10-CM

## 2014-11-23 LAB — HEPATIC FUNCTION PANEL
ALBUMIN: 3.7 g/dL (ref 3.5–5.2)
ALK PHOS: 105 U/L (ref 39–117)
ALT: 14 U/L (ref 0–35)
AST: 22 U/L (ref 0–37)
Bilirubin, Direct: 0.1 mg/dL (ref 0.0–0.3)
TOTAL PROTEIN: 7 g/dL (ref 6.0–8.3)
Total Bilirubin: 0.6 mg/dL (ref 0.2–1.2)

## 2014-11-23 LAB — BASIC METABOLIC PANEL
BUN: 35 mg/dL — AB (ref 6–23)
CALCIUM: 9.4 mg/dL (ref 8.4–10.5)
CHLORIDE: 102 meq/L (ref 96–112)
CO2: 31 meq/L (ref 19–32)
CREATININE: 1.89 mg/dL — AB (ref 0.40–1.20)
GFR: 27.09 mL/min — ABNORMAL LOW (ref >60.00)
Glucose, Bld: 90 mg/dL (ref 70–99)
Potassium: 3.8 mEq/L (ref 3.5–5.1)
Sodium: 141 mEq/L (ref 135–145)

## 2014-11-23 LAB — VITAMIN D 25 HYDROXY (VIT D DEFICIENCY, FRACTURES): VITD: 40.41 ng/mL (ref 30.00–100.00)

## 2014-11-23 LAB — TSH: TSH: 1.76 u[IU]/mL (ref 0.35–4.50)

## 2014-11-23 NOTE — Progress Notes (Signed)
  [name] [address] [place], [place] [address] Phone: [phone] Subjective:      CC: Left leg pain follow up  [id number] [name] [name] is a 81 y.o. female coming in with complaint of left leg pain. Patient was seen by me for degenerative meniscal tear as well as moderate arthritis of the knee. Patient's last injection was almost 1 year ago. Patient's last follow-up was 9 months ago and patient was doing relatively well. Patient states the pain is starting to come back over the course last several weeks. States that it seems to be starting to agitate her and keep her from going up and down stairs. Patient has not been wearing the brace as regularly because she did not needed. Continues to use the aid of a walker from time to time. Patient denies any radiation of the pain significantly. Denies any numbness or would consider any weakness.  Past medical history, social, surgical and family history all reviewed in electronic medical record.   Review of Systems: No headache, visual changes, nausea, vomiting, diarrhea, constipation, dizziness, abdominal pain, skin rash, fevers, chills, night sweats, weight loss, swollen lymph nodes, body aches, joint swelling, muscle aches, chest pain, shortness of breath, mood changes.   Objective Blood pressure 138/82, pulse 85, height 5\' 2"  (1.575 m), weight 173 lb (78.472 kg), SpO2 98 %.  General: No apparent distress alert and oriented x3 mood and affect normal, dressed appropriately. Patient does appear somewhat pale HEENT: Pupils equal, extraocular movements intact  Respiratory: Patient's speak in full sentences and does not appear short of breath  Cardiovascular: No lower extremity edema, non tender, no erythema  Skin: Warm dry intact with no signs of infection or rash on extremities or on axial skeleton.  Abdomen: Soft nontender  Neuro: Cranial nerves II through XII are intact, neurovascularly intact in all  extremities with 2+ DTRs and 2+ pulses.  Lymph: No lymphadenopathy of posterior or anterior cervical chain or axillae bilaterally.  Gait ambulates with kyphosis and a walker MSK:  mild tender with near full range of motion and good stability and symmetric strength and tone of shoulders, elbows, wrist, hips bilaterally. Multiple osteoarthritic changes in multiple joints. Knee: Left Mild valgus deformity noted Tender to palpation over the medial joint line Lacking last 5 of extension and less 5 of flexion Ligaments with solid consistent endpoints including ACL, PCL, LCL, MCL. Mild instability noted. negativeMcmurray's, Apley's, and Thessalonian tests. Non painful patellar compression. Patellar glide with minimal crepitus. Patellar and quadriceps tendons unremarkable. Hamstring and quadriceps strength is normal.   contralateral knee has some minimal tenderness to palpation as well.   After informed written and verbal consent, patient was seated on exam table. Left knee was prepped with alcohol swab and utilizing anterolateral approach, patient's left knee space was injected with 4:1  marcaine 0.5%: Kenalog 40mg /dL. Patient tolerated the procedure well without immediate complications.      Impression and Recommendations:     This case required medical decision making of moderate complexity.

## 2014-11-23 NOTE — Assessment & Plan Note (Signed)
Patient given an injection today and tolerated the procedure very well. We discussed icing regimen and home exercises. Patient given topical anti-inflammatory that I think be beneficial. Encourage patient to takes certain nutritional and supplemental vitamins. We discussed icing regimen and possible custom brace which patient [name] [name] today. Patient is to remain active. Patient come back in 3 weeks. Continuing to have difficulty patient could be a candidate for viscous supplementation.  Spent  25 minutes with patient face-to-face and had greater than 50% of counseling including as described above in assessment and plan.

## 2014-11-23 NOTE — Progress Notes (Signed)
Pre visit review using our clinic review tool, if applicable. No additional management support is needed unless otherwise documented below in the visit note. 

## 2014-11-23 NOTE — Patient Instructions (Signed)
Wonderful to see you! I would even consider iron with vitamin C 3 times a week For the hands wear gloves at night Increase vitmain D to 2000 IU daily For the knee pennsaid pinkie amount topically 2 times daily as needed.  We will wait on the brace for now Tylenol 500mg  3 times a day scheduled See me again in 3 weeks if in pain and we can start the other injections.

## 2014-12-13 ENCOUNTER — Encounter: Payer: Self-pay | Admitting: Family Medicine

## 2014-12-13 ENCOUNTER — Ambulatory Visit (INDEPENDENT_AMBULATORY_CARE_PROVIDER_SITE_OTHER): Payer: Medicare Other | Admitting: Family Medicine

## 2014-12-13 ENCOUNTER — Other Ambulatory Visit (INDEPENDENT_AMBULATORY_CARE_PROVIDER_SITE_OTHER): Payer: Medicare Other

## 2014-12-13 ENCOUNTER — Telehealth: Payer: Self-pay | Admitting: Internal Medicine

## 2014-12-13 VITALS — BP 132/72 | HR 78 | Ht 62.0 in | Wt 173.0 lb

## 2014-12-13 DIAGNOSIS — G5702 Lesion of sciatic nerve, left lower limb: Secondary | ICD-10-CM

## 2014-12-13 DIAGNOSIS — I6529 Occlusion and stenosis of unspecified carotid artery: Secondary | ICD-10-CM | POA: Diagnosis not present

## 2014-12-13 DIAGNOSIS — M1712 Unilateral primary osteoarthritis, left knee: Secondary | ICD-10-CM | POA: Diagnosis not present

## 2014-12-13 MED ORDER — GABAPENTIN 100 MG PO CAPS: 100.0000 mg | ORAL_CAPSULE | Freq: Every day | ORAL | Status: DC

## 2014-12-13 NOTE — Assessment & Plan Note (Signed)
Patient given injection today and tolerated the procedure very well. Differential also includes lumbar radiculopathy especially at the amount of arthritis as well as compression fracture seen previously in April. We discussed of this does not seem to work I would consider an L5-S1 epidural as well as possibly a nerve root block of the sciatic nerve on the left side. Patient though did have a negative straight leg test today. Encourage patient to continue the vitamin supplementation. Handout given a some mild range of motion exercises mostly working on stretching. Discussed that this is secondary to muscle imbalances and we'll need to do some strengthening. Declined formal physical therapy. Today. Patient come back in 3-4 weeks for further evaluation and treatment.

## 2014-12-13 NOTE — Telephone Encounter (Signed)
Spoke with pt, she was concerned that she was suppose to go have an ultrasound done today. i explained to the pt that was not the case, dr Tamala Julian used the ultrasound today to do a guided injection. Pt understood.

## 2014-12-13 NOTE — Patient Instructions (Addendum)
Good to see you Ice is your friend We tried an injection today.  Gabapentin 100mg  at night to help if there is a nerve in your back. It will make you a little sleepy.  Continue the vitamins Have a great holiday.  See you again 3-4 weeks, otherwise we may need to try an epidural and you can just call me.

## 2014-12-13 NOTE — Progress Notes (Signed)
[name] [address] [place], [place] [address] Phone: [phone] Subjective:      CC: Left leg pain follow up  [id number] [name] [name] is a 81 y.o. female coming in with complaint of left leg pain. Patient was seen by me for degenerative meniscal tear as well as moderate arthritis of the knee. Patient 2 weeks ago was given another injection in her knee. Patient was to do conservative therapy. Patient states he is feeling significantly better. No significant instability at this time. Still ambulating with the aid of a walker.  She is complaining of a new pain. Seems to be more the left buttocks. Patient did have a past medical history significant for multiple compression fractures after a fall and did need kyphoplasty back in this year. Patient states that she does not know if this is associated with her back or not. Seems to be worse after sitting a long amount of time. States that going up and down stairs can make it painful. Mild radiation down the posterior her aspect of the leg intermittently. No significant weakness. Rates the severity of 710 Past medical history, social, surgical and family history all reviewed in electronic medical record.   Review of Systems: No headache, visual changes, nausea, vomiting, diarrhea, constipation, dizziness, abdominal pain, skin rash, fevers, chills, night sweats, weight loss, swollen lymph nodes, body aches, joint swelling, muscle aches, chest pain, shortness of breath, mood changes.   Objective Blood pressure 132/72, pulse 78, height 5\' 2"  (1.575 m), weight 173 lb (78.472 kg), SpO2 98 %.  General: No apparent distress alert and oriented x3 mood and affect normal, dressed appropriately. Patient does appear somewhat pale HEENT: Pupils equal, extraocular movements intact  Respiratory: Patient's speak in full sentences and does not appear short of breath  Cardiovascular: No lower extremity edema, non tender, no  erythema  Skin: Warm dry intact with no signs of infection or rash on extremities or on axial skeleton.  Abdomen: Soft nontender  Neuro: Cranial nerves II through XII are intact, neurovascularly intact in all extremities with 2+ DTRs and 2+ pulses.  Lymph: No lymphadenopathy of posterior or anterior cervical chain or axillae bilaterally.  Gait ambulates with kyphosis and a walker MSK:  mild tender with near full range of motion and good stability and symmetric strength and tone of shoulders, elbows, wrist, hips bilaterally. Multiple osteoarthritic changes in multiple joints. Knee: Left Mild valgus deformity noted Emil tenderness over the medial joint line still. Lacking last 5 of extension and less 5 of flexion Ligaments with solid consistent endpoints including ACL, PCL, LCL, MCL. Mild instability noted. negativeMcmurray's, Apley's, and Thessalonian tests. Non painful patellar compression. Patellar glide with minimal crepitus. Patellar and quadriceps tendons unremarkable. Hamstring and quadriceps strength is normal.   contralateral knee has some minimal tenderness to palpation as well.  . Back exam shows the patient does have mild increasing kyphosis. For flexion 225 extension of 15. Patient does have some limitation with range of motion side bending and rotation bilaterally but symmetric. Negative straight leg test. Neurovascular intact distally with full strength of the ankles. Deep tendon reflexes are intact. Patient does have positive Corky Sox on the left side. Severely tender over the piriformis muscle on the left side. Mild tenderness of the paraspinal musculature of the lumbar spine.   Procedure: Real-time Ultrasound Guided Injection of left piriformis muscle Device: GE Logiq E  Ultrasound guided injection is preferred based studies that show increased duration, increased effect, greater  accuracy, decreased procedural pain, increased response rate, and decreased cost with ultrasound  guided versus blind injection.  Verbal informed consent obtained.  Time-out conducted.  Noted no overlying erythema, induration, or other signs of local infection.  Skin prepped in a sterile fashion.  Local anesthesia: Topical Ethyl chloride.  With sterile technique and under real time ultrasound guidance:  With a 22-gauge 3 inch needle patient was injected with a total of 0.5 mL of 0.5% Marcaine and 0.5 mL of Kenalog 40 mg/dL. Completed without difficulty  Pain immediately resolved suggesting accurate placement of the medication.  Advised to call if fevers/chills, erythema, induration, drainage, or persistent bleeding.  Images permanently stored and available for review in the ultrasound unit.  Impression: Technically successful ultrasound guided injection.      Impression and Recommendations:     This case required medical decision making of moderate complexity.

## 2014-12-13 NOTE — Telephone Encounter (Signed)
Pt called in and has questions about the ultrasound that was scheduled?  She did not go because she did not know what it was for.  She wants to know what she should do about it

## 2014-12-13 NOTE — Assessment & Plan Note (Signed)
Doing well after injection. Patient can follow-up again in 6-8 weeks if another injection is necessary.

## 2014-12-19 ENCOUNTER — Ambulatory Visit (INDEPENDENT_AMBULATORY_CARE_PROVIDER_SITE_OTHER): Payer: Medicare Other | Admitting: Internal Medicine

## 2014-12-19 ENCOUNTER — Encounter: Payer: Self-pay | Admitting: Internal Medicine

## 2014-12-19 VITALS — BP 140/74 | HR 72 | Wt 172.0 lb

## 2014-12-19 DIAGNOSIS — I6529 Occlusion and stenosis of unspecified carotid artery: Secondary | ICD-10-CM | POA: Diagnosis not present

## 2014-12-19 DIAGNOSIS — Z23 Encounter for immunization: Secondary | ICD-10-CM | POA: Diagnosis not present

## 2014-12-19 DIAGNOSIS — I480 Paroxysmal atrial fibrillation: Secondary | ICD-10-CM

## 2014-12-19 DIAGNOSIS — I2510 Atherosclerotic heart disease of native coronary artery without angina pectoris: Secondary | ICD-10-CM | POA: Diagnosis not present

## 2014-12-19 DIAGNOSIS — G5702 Lesion of sciatic nerve, left lower limb: Secondary | ICD-10-CM | POA: Diagnosis not present

## 2014-12-19 DIAGNOSIS — I5020 Unspecified systolic (congestive) heart failure: Secondary | ICD-10-CM

## 2014-12-19 DIAGNOSIS — N184 Chronic kidney disease, stage 4 (severe): Secondary | ICD-10-CM

## 2014-12-19 MED ORDER — IRON POLYSACCH CMPLX-B12-FA 150-0.025-1 MG PO CAPS: 1.0000 | ORAL_CAPSULE | Freq: Every day | ORAL | Status: DC

## 2014-12-19 MED ORDER — LORAZEPAM 0.5 MG PO TABS: 0.5000 mg | ORAL_TABLET | Freq: Four times a day (QID) | ORAL | Status: DC | PRN

## 2014-12-19 NOTE — Progress Notes (Signed)
Subjective:  Patient ID: [name] [name], female    DOB: 08-23-1931  Age: 81 y.o. MRN: [id number]  CC: No chief complaint on file.   HPI [name] [name] presents for CHF, CAD, A fib, anxiety f/u  Outpatient Prescriptions Prior to Visit  Medication Sig Dispense Refill  . acetaminophen (TYLENOL) 650 MG CR tablet Take 325 mg by mouth every 8 (eight) hours as needed for pain (arthritis).     [name] albuterol (PROVENTIL HFA;VENTOLIN HFA) 108 (90 BASE) MCG/ACT inhaler Inhale into the lungs every 6 (six) hours as needed for wheezing or shortness of breath.    [name] aspirin 325 MG EC tablet Take 325 mg by mouth daily.    [name] atorvastatin (LIPITOR) 40 MG tablet Take 1 tablet (40 mg total) by mouth daily. 90 tablet 3  . b complex vitamins capsule Take 1 capsule by mouth daily.    . Biotin 5000 MCG CAPS Take 1 capsule by mouth daily.    . Cholecalciferol (EQL VITAMIN D3) 1000 UNITS tablet Take 1 tablet (1,000 Units total) by mouth daily. 100 tablet 3  . darbepoetin (ARANESP) 100 MCG/0.5ML SOLN injection Inject 100 mcg into the skin every 8 (eight) weeks.    . furosemide (LASIX) 20 MG tablet Take 1 tablet (20 mg total) by mouth daily. 30 tablet 11  . gabapentin (NEURONTIN) 100 MG capsule Take 1 capsule (100 mg total) by mouth at bedtime. 30 capsule 3  . levothyroxine (SYNTHROID, LEVOTHROID) 50 MCG tablet TAKE 1 TABLET BY MOUTH EVERY DAY BEFORE BREAKFAST 90 tablet 3  . metoprolol tartrate (LOPRESSOR) 25 MG tablet Take 0.5 tablets (12.5 mg total) by mouth 2 (two) times daily. (Patient taking differently: Take 12.5 mg by mouth daily. ) 30 tablet 11  . pantoprazole (PROTONIX) 40 MG tablet Take 40 mg by mouth daily.    . sennosides-docusate sodium (SENOKOT-S) 8.6-50 MG tablet Take 1 tablet by mouth daily.     [name] LORazepam (ATIVAN) 0.5 MG tablet TAKE 1 TABLET BY MOUTH EVERY 6 HOURS AS NEEDED FOR ANXIETY 30 tablet 2   No facility-administered medications prior to visit.    ROS Review of Systems  Constitutional:  Positive for fatigue. Negative for chills, activity change, appetite change and unexpected weight change.  HENT: Negative for congestion, mouth sores and sinus pressure.   Eyes: Negative for visual disturbance.  Respiratory: Negative for cough and chest tightness.   Gastrointestinal: Negative for nausea and abdominal pain.  Genitourinary: Negative for frequency, difficulty urinating and vaginal pain.  Musculoskeletal: Positive for back pain, arthralgias and gait problem.  Skin: Negative for pallor and rash.  Neurological: Negative for dizziness, tremors, weakness, numbness and headaches.  Psychiatric/Behavioral: Positive for decreased concentration. Negative for confusion and sleep disturbance.    Objective:  BP 140/74 mmHg  Pulse 72  Wt 172 lb (78.019 kg)  SpO2 95%  BP Readings from Last 3 Encounters:  12/19/14 140/74  12/13/14 132/72  11/23/14 138/82    Wt Readings from Last 3 Encounters:  12/19/14 172 lb (78.019 kg)  12/13/14 173 lb (78.472 kg)  11/23/14 173 lb (78.472 kg)    Physical Exam  Constitutional: She appears well-developed. No distress.  HENT:  Head: Normocephalic.  Right Ear: External ear normal.  Left Ear: External ear normal.  Nose: Nose normal.  Mouth/Throat: Oropharynx is clear and moist.  Eyes: Conjunctivae are normal. Pupils are equal, round, and reactive to light. Right eye exhibits no discharge. Left eye exhibits no discharge.  Neck: Normal range  of motion. Neck supple. No JVD present. No tracheal deviation present. No thyromegaly present.  Cardiovascular: Normal rate, regular rhythm and normal heart sounds.   Pulmonary/Chest: No stridor. No respiratory distress. She has no wheezes.  Abdominal: Soft. Bowel sounds are normal. She exhibits no distension and no mass. There is no tenderness. There is no rebound and no guarding.  Musculoskeletal: She exhibits tenderness. She exhibits no edema.  Lymphadenopathy:    She has no cervical adenopathy.    Neurological: She displays normal reflexes. No cranial nerve deficit. She exhibits normal muscle tone. Coordination abnormal.  Skin: No rash noted. No erythema.  Psychiatric: She has a normal mood and affect. Her behavior is normal. Judgment and thought content normal.  LS tender, L buttock is tender Using a walker  Lab Results  Component Value Date   WBC 6.2 09/28/2014   HGB 9.4* 09/28/2014   HCT 28.4* 09/28/2014   PLT 321 09/28/2014   GLUCOSE 90 11/23/2014   CHOL 92 04/15/2013   TRIG 62 04/15/2013   HDL 40 04/15/2013   LDLCALC 30 04/15/2013   ALT 14 11/23/2014   AST 22 11/23/2014   NA 141 11/23/2014   K 3.8 11/23/2014   CL 102 11/23/2014   CREATININE 1.89* 11/23/2014   BUN 35* 11/23/2014   CO2 31 11/23/2014   TSH 1.76 11/23/2014   INR 0.98 05/03/2014    Mr Lumbar Spine Wo Contrast  05/02/2014  CLINICAL DATA:  81 year old female with chronic lumbar back pain radiating to the left lower extremity. Unable to ambulate. Left side sciatica. Initial encounter. EXAM: MRI LUMBAR SPINE WITHOUT CONTRAST TECHNIQUE: Multiplanar, multisequence MR imaging of the lumbar spine was performed. No intravenous contrast was administered. COMPARISON:  Lumbar radiographs 08/25/2013 and earlier FINDINGS: Normal lumbar segmentation demonstrated on the comparisons. Chronic L3, L2, and to a lesser extent L1 compression fractures. The L3 level was compressed in 2015, although there is mild endplate edema at that level and also inferiorly at L2. Moderate T12 compression fracture also appears present on the prior studies but demonstrates mild to moderate marrow edema (series 6, image 9). The T11, L4, and L5 levels appear intact.  Visible sacrum intact. Mild retropulsion of bone related to the above compression deformities and confounding degenerative disc osteophyte complex in the lumbar spine. Details below. Visualized lower thoracic spinal cord is normal with conus medularis at L1. Nonspecific perinephric  stranding. Otherwise negative visualized abdominal viscera. T10-T11:  Negative. T11-T12:  Negative. T12-L1:  Negative. L1-L2: Disc space loss. Circumferential disc osteophyte complex. Mild to moderate facet hypertrophy. Mild spinal and moderate L1 foraminal stenosis. L2-L3: Circumferential disc osteophyte complex. Mild facet and ligament flavum hypertrophy. Borderline to mild spinal stenosis. No significant foraminal stenosis. L3-L4: Circumferential disc osteophyte complex. Moderate facet and ligament flavum hypertrophy. Mild spinal and L3 foraminal stenosis. L4-L5: Disc space loss. Right eccentric circumferential disc osteophyte complex. Mild facet and ligament flavum hypertrophy. No significant spinal stenosis. Mild lateral recess and L4 foraminal stenosis. L5-S1: Disc space loss. Circumferential disc osteophyte complex. Mild facet hypertrophy. No spinal or lateral recess stenosis. Moderate to severe L5 foraminal stenosis, primarily related to bony spurring. IMPRESSION: 1. Multilevel chronic compression fractures. Mild to moderate marrow edema at the T12 level suggesting acute or subacute exacerbation of chronic compression. Trace marrow edema also at the L2-L3 endplates. 2. Mild retropulsion of bone at most levels exacerbating chronic disc and endplate degeneration. Up to mild degenerative spinal stenosis at L1-L2, L2-L3 and L3-L4. Mild lateral recess stenosis at L4-L5.  Moderate to severe foraminal stenosis at L5-S1. Electronically Signed   By: Genevie Ann M.D.   On: 05/02/2014 14:13    Assessment & Plan:   There are no diagnoses linked to this encounter. I have changed Ms. Hoheisel's LORazepam. I am also having her maintain her aspirin, sennosides-docusate sodium, metoprolol tartrate, acetaminophen, b complex vitamins, Biotin, furosemide, darbepoetin, Cholecalciferol, levothyroxine, atorvastatin, albuterol, pantoprazole, and gabapentin.  Meds ordered this encounter  Medications  . LORazepam (ATIVAN) 0.5 MG  tablet    Sig: Take 1 tablet (0.5 mg total) by mouth every 6 (six) hours as needed. for anxiety    Dispense:  30 tablet    Refill:  3     Follow-up: No Follow-up on file.  [name], MD

## 2014-12-19 NOTE — Assessment & Plan Note (Addendum)
Appt w/Dr Tamala Julian in Dec

## 2014-12-19 NOTE — Assessment & Plan Note (Signed)
On ASA, Toprol 

## 2014-12-19 NOTE — Assessment & Plan Note (Signed)
ASA, Lopressor, Lipitor

## 2014-12-19 NOTE — Progress Notes (Signed)
Pre visit review using our clinic review tool, if applicable. No additional management support is needed unless otherwise documented below in the visit note. 

## 2014-12-19 NOTE — Assessment & Plan Note (Signed)
Furosemide, Lopressor 

## 2014-12-23 ENCOUNTER — Ambulatory Visit (INDEPENDENT_AMBULATORY_CARE_PROVIDER_SITE_OTHER)
Admission: RE | Admit: 2014-12-23 | Discharge: 2014-12-23 | Disposition: A | Discharge status: Home / Self Care | Payer: Medicare Other | Source: Ambulatory Visit | Attending: Internal Medicine | Admitting: Internal Medicine

## 2014-12-23 ENCOUNTER — Ambulatory Visit (INDEPENDENT_AMBULATORY_CARE_PROVIDER_SITE_OTHER): Payer: Medicare Other | Admitting: Internal Medicine

## 2014-12-23 ENCOUNTER — Encounter: Payer: Self-pay | Admitting: Internal Medicine

## 2014-12-23 ENCOUNTER — Other Ambulatory Visit (INDEPENDENT_AMBULATORY_CARE_PROVIDER_SITE_OTHER): Payer: Medicare Other

## 2014-12-23 VITALS — BP 152/80 | HR 67 | Temp 98.0°F | Ht 62.0 in | Wt 174.1 lb

## 2014-12-23 DIAGNOSIS — I6529 Occlusion and stenosis of unspecified carotid artery: Secondary | ICD-10-CM

## 2014-12-23 DIAGNOSIS — M79671 Pain in right foot: Secondary | ICD-10-CM

## 2014-12-23 DIAGNOSIS — Z8639 Personal history of other endocrine, nutritional and metabolic disease: Secondary | ICD-10-CM | POA: Diagnosis not present

## 2014-12-23 DIAGNOSIS — S51812A Laceration without foreign body of left forearm, initial encounter: Secondary | ICD-10-CM

## 2014-12-23 DIAGNOSIS — Z8739 Personal history of other diseases of the musculoskeletal system and connective tissue: Secondary | ICD-10-CM

## 2014-12-23 LAB — URIC ACID: Uric Acid, Serum: 10.3 mg/dL — ABNORMAL HIGH (ref 2.4–7.0)

## 2014-12-23 MED ORDER — PREDNISONE 10 MG PO TABS: ORAL_TABLET | ORAL | Status: DC

## 2014-12-23 NOTE — Progress Notes (Signed)
   Subjective:    Patient ID: [name] [name], female    DOB: August 30, 1931, 81 y.o.   MRN: [id number]  HPI  She became unbalanced and fell 12/20/14 and injured her left forearm. Her arm hit the underside of the counter causing some sloughing. She applied a Band-Aid and noted bleeding subsequently. She is on 325 mg of aspirin. There was no cardiac or neurologic prodrome prior to the fall  Since that day she's had pressure in the left forefoot dorsally and inferiorly. There is a been associated discoloration as well. The discomfort increases with mobilization.  She has no fever, chills, or sweats.  She has a history of gout in 2015.  Review of Systems  Denied were any change in heart rhythm or rate prior to the event. There was no associated chest pain or shortness of breath .  Also specifically denied prior to the episode were headache, limb weakness, tingling, or numbness. No seizure activity noted.     Objective:   Physical Exam  Pertinent or positive findings include: There is accentuated curvature of the upper thoracic spine. She has isolated DIP osteoarthritic change in the hands. Isolated hammertoe changes are noted of the feet. There is an area of avulsion of the dermis measuring 4 x 2 cm of the left forearm. Above this is an area of bruising 3.5 x 3.5 cm. There is diffuse, very faint erythema over the left forefoot. There is marked tenderness generally but especially at the base of the right great toe.  General appearance :adequately nourished; in no distress.  Eyes: No conjunctival inflammation or scleral icterus is present.  Heart:  Slow rate and regular rhythm. S1 and S2 normal without gallop, murmur, click, rub or other extra sounds    Lungs:Chest clear to auscultation; no wheezes, rhonchi,rales ,or rubs present.No increased work of breathing.   Abdomen: bowel sounds normal, soft and non-tender without masses, organomegaly or hernias noted.  No guarding or  rebound.  Vascular : all pulses equal ; no bruits present.  Skin:Warm & dry.  Intact without suspicious lesions or rashes ; no tenting or jaundice   Lymphatic: No lymphadenopathy is noted about the head, neck, axilla.   Neuro: Strength, tone generally decreased. She ambulates with a rolling walker.      Assessment & Plan:  #1 right foot pain; clinically gout is present  #2 skin tear left forearm without evidence of cellulitis  See orders and recommendations

## 2014-12-23 NOTE — Patient Instructions (Signed)
Please report warning signs as we discussed. Worrisome would be red streaks up the army, increased pain, fever, or pus production. Use Telfa , non stick bandaids.

## 2014-12-23 NOTE — Progress Notes (Signed)
Pre visit review using our clinic review tool, if applicable. No additional management support is needed unless otherwise documented below in the visit note. 

## 2014-12-26 ENCOUNTER — Ambulatory Visit: Payer: Self-pay | Admitting: Internal Medicine

## 2014-12-29 ENCOUNTER — Other Ambulatory Visit (HOSPITAL_BASED_OUTPATIENT_CLINIC_OR_DEPARTMENT_OTHER): Payer: Medicare Other

## 2014-12-29 ENCOUNTER — Ambulatory Visit (HOSPITAL_BASED_OUTPATIENT_CLINIC_OR_DEPARTMENT_OTHER): Payer: Medicare Other | Admitting: Hematology

## 2014-12-29 ENCOUNTER — Encounter: Payer: Self-pay | Admitting: Hematology

## 2014-12-29 ENCOUNTER — Ambulatory Visit (HOSPITAL_BASED_OUTPATIENT_CLINIC_OR_DEPARTMENT_OTHER): Payer: Medicare Other

## 2014-12-29 VITALS — BP 150/68 | HR 70 | Temp 97.0°F | Resp 18 | Ht 62.0 in | Wt 171.7 lb

## 2014-12-29 DIAGNOSIS — I2510 Atherosclerotic heart disease of native coronary artery without angina pectoris: Secondary | ICD-10-CM | POA: Diagnosis not present

## 2014-12-29 DIAGNOSIS — N184 Chronic kidney disease, stage 4 (severe): Secondary | ICD-10-CM | POA: Diagnosis not present

## 2014-12-29 DIAGNOSIS — M1990 Unspecified osteoarthritis, unspecified site: Secondary | ICD-10-CM | POA: Diagnosis not present

## 2014-12-29 DIAGNOSIS — D631 Anemia in chronic kidney disease: Secondary | ICD-10-CM

## 2014-12-29 DIAGNOSIS — N189 Chronic kidney disease, unspecified: Principal | ICD-10-CM

## 2014-12-29 DIAGNOSIS — I509 Heart failure, unspecified: Secondary | ICD-10-CM | POA: Diagnosis not present

## 2014-12-29 DIAGNOSIS — M109 Gout, unspecified: Secondary | ICD-10-CM

## 2014-12-29 DIAGNOSIS — D649 Anemia, unspecified: Secondary | ICD-10-CM

## 2014-12-29 DIAGNOSIS — I10 Essential (primary) hypertension: Secondary | ICD-10-CM

## 2014-12-29 LAB — CBC WITH DIFFERENTIAL/PLATELET
BASO%: 0.2 % (ref 0.0–2.0)
Basophils Absolute: 0 10*3/uL (ref 0.0–0.1)
EOS%: 1.3 % (ref 0.0–7.0)
Eosinophils Absolute: 0.1 10*3/uL (ref 0.0–0.5)
HCT: 31.2 % — ABNORMAL LOW (ref 34.8–46.6)
HEMOGLOBIN: 10.3 g/dL — AB (ref 11.6–15.9)
LYMPH%: 23 % (ref 14.0–49.7)
MCH: 30.1 pg (ref 25.1–34.0)
MCHC: 33 g/dL (ref 31.5–36.0)
MCV: 91.2 fL (ref 79.5–101.0)
MONO#: 0.8 10*3/uL (ref 0.1–0.9)
MONO%: 8.8 % (ref 0.0–14.0)
NEUT%: 66.7 % (ref 38.4–76.8)
NEUTROS ABS: 6.3 10*3/uL (ref 1.5–6.5)
PLATELETS: 431 10*3/uL — AB (ref 145–400)
RBC: 3.42 10*6/uL — AB (ref 3.70–5.45)
RDW: 13.8 % (ref 11.2–14.5)
WBC: 9.4 10*3/uL (ref 3.9–10.3)
lymph#: 2.2 10*3/uL (ref 0.9–3.3)

## 2014-12-29 LAB — IRON AND TIBC
%SAT: 30 % (ref 21–57)
IRON: 77 ug/dL (ref 41–142)
TIBC: 258 ug/dL (ref 236–444)
UIBC: 180 ug/dL (ref 120–384)

## 2014-12-29 LAB — FERRITIN: FERRITIN: 159 ng/mL (ref 9–269)

## 2014-12-29 MED ORDER — DARBEPOETIN ALFA 200 MCG/0.4ML IJ SOSY
200.0000 ug | PREFILLED_SYRINGE | Freq: Once | INTRAMUSCULAR | Status: AC
  Administered 2014-12-29: 200 ug via SUBCUTANEOUS
  Filled 2014-12-29: qty 0.4

## 2014-12-29 MED ORDER — DARBEPOETIN ALFA-POLYSORBATE 500 MCG/ML IJ SOLN: 200.0000 ug | Freq: Once | INTRAMUSCULAR | Status: DC

## 2014-12-29 NOTE — Progress Notes (Addendum)
[facility] HEMATOLOGY OFFICE PROGRESS NOTE DATE OF VISIT: 12/29/2014   Alex Plotnikov, MD [address] [place] [address]  DIAGNOSIS: Anemia in chronic renal disease  CKD (chronic kidney disease) stage 4, GFR 15-29 ml/min (HCC)  No chief complaint on file.   CURRENT THERAPY: Aranesp 200 mcg subcutaneous every 3 months for hemoglobin less than 11.   INTERVAL HISTORY: [name] [name] 81 y.o. female with a history of anemia secondary to renal insufficiency is here for follow-up. She was last seen by me 6 month ago. She has been getting Aranesp injection every 3 months (previously every 2 months).  She had two episodes of gout flare in the past several months, and recently finished a course of steroids. She had many questions about why she keep getting gout flare. She otherwise is doing well, still has mild fatigue, but able to tolerate daily activity without much difficulty. She lives alone, does use a walker when she goes out, but is totally independent.   MEDICAL HISTORY: Past Medical History  Diagnosis Date  . Unspecified essential hypertension     Dr Nishan  . Other and unspecified hyperlipidemia   . Unspecified disorder resulting from impaired renal function   . Diverticulosis of colon with hemorrhage   . Unspecified vitamin D deficiency   . Unspecified asthma(493.90)   . Persistent disorder of initiating or maintaining sleep   . Lumbago   . Esophageal reflux   . Diverticulosis of colon (without mention of hemorrhage)   . Depressive disorder, not elsewhere classified   . Anxiety state, unspecified   . Anemia, unspecified   . Unspecified osteomyelitis, site unspecified   . Myocardial infarction (HCC)   . Thyroid disease    INTERIM HISTORY: has Vitamin D deficiency; Dyslipidemia; DEHYDRATION (VOLUME DEPLETION); Anxiety state; INSOMNIA, PERSISTENT; Essential hypertension; Coronary atherosclerosis; Bilateral carotid bruits; PERIPHERAL VASCULAR DISEASE;  BRONCHITIS NOT SPECIFIED AS ACUTE OR CHRONIC; Asthma; GERD; DIVERTICULOSIS, COLON; Diverticulosis of colon with hemorrhage; PANCREATITIS; Disorder resulting from impaired renal function; PYELONEPHRITIS; UNSPECIFIED DISORDER OF URETHRA&URINARY TRACT; LOW BACK PAIN; OSTEOMYELITIS; SYNCOPE; Fatigue; Anemia in chronic renal disease; Cramps, extremity; Nausea alone; Unspecified constipation; Unspecified hypothyroidism; Cough; A-fib (HCC); CHF (congestive heart failure) (HCC); PNA (pneumonia); Thyroid nodule; Influenza due to influenza A virus; Minimal cognitive impairment; Neuropathy (HCC); Acute non-ST segment elevation myocardial infarction (HCC); Disuse syndrome; Restless leg; Left leg weakness; Fall against object; Left ankle pain; Primary localized osteoarthrosis, lower leg; Alopecia; Pes anserine bursitis; Situational depression; Decreased ambulation status; Left-sided low back pain with sciatica; CKD (chronic kidney disease) stage 4, GFR 15-29 ml/min (HCC); Back pain; Compression fracture of T12 vertebra with delayed healing; Poor tolerance for ambulation; Osteoporosis; and Piriformis syndrome of left side on her problem list.    ALLERGIES:  is allergic to codeine sulfate; darvon; propoxyphene napsylate; sulfacetamide sodium-sulfur; sulfamethoxazole-trimethoprim; and zolpidem.  MEDICATIONS: has a current medication list which includes the following prescription(s): acetaminophen, albuterol, aspirin, atorvastatin, b complex vitamins, biotin, cholecalciferol, darbepoetin, furosemide, gabapentin, iron polysacch cmplx-b12-fa, levothyroxine, lorazepam, metoprolol tartrate, pantoprazole, prednisone, and sennosides-docusate sodium.  SURGICAL HISTORY:  Past Surgical History  Procedure Laterality Date  . Bone marrow biopsy    . Status post right carotid enterectomy    . Partial colectomy  1995    diverticulosis of colon iwth hemorrhage.   . Coronary artery bypass graft  03/25/13    x4. CABS ON PUMP-Surgeon Neal  Kon.    REVIEW OF SYSTEMS:   Constitutional: Denies fevers, chills; 14 lb weight lost   doing recent hospitalization but now regaining her  Eyes: Denies blurriness of vision Ears, nose, mouth, throat, and face: Denies mucositis or sore throat Respiratory: Denies cough, dyspnea or wheezes Cardiovascular: Denies palpitation, chest discomfort or lower extremity swelling Gastrointestinal:  Denies nausea, heartburn or change in bowel habits Skin: Denies abnormal skin rashes Lymphatics: Denies new lymphadenopathy or easy bruising Neurological:Denies numbness, tingling or new weaknesses Behavioral/Psych: Mood is stable, no new changes  All other systems were reviewed with the patient and are negative.  PHYSICAL EXAMINATION: ECOG PERFORMANCE STATUS: 1  Blood pressure 150/68, pulse 70, temperature 97 F (36.1 C), temperature source Oral, resp. rate 18, height 5' 2" (1.575 m), weight 171 lb 11.2 oz (77.883 kg), SpO2 98 %. (178 lbs last visit).   GENERAL:alert, no distress and comfortable; elderly female who is well developed, well nourished.  SKIN: skin color, texture, turgor are normal, no rashes or significant lesions; scar on chest well healed.  EYES: normal, Conjunctiva are pink and non-injected, sclera clear OROPHARYNX:no exudate, no erythema and lips, buccal mucosa, and tongue normal  NECK: supple, thyroid normal size, non-tender, without nodularity LYMPH:  no palpable lymphadenopathy in the cervical, axillary or supraclavicular LUNGS: clear to auscultation and percussion with normal breathing effort HEART: regular rate & rhythm and no murmurs and no lower extremity edema ABDOMEN:abdomen soft, non-tender and normal bowel sounds Musculoskeletal:no cyanosis of digits and no clubbing  NEURO: alert & oriented x 3 with fluent speech, no focal motor/sensory deficits  LABORATORY DATA: CBC Latest Ref Rng 12/29/2014 09/28/2014 07/13/2014  WBC 3.9 - 10.3 10e3/uL 9.4 6.2 5.3  Hemoglobin 11.6 - 15.9  g/dL 10.3(L) 9.4(L) 10.8(L)  Hematocrit 34.8 - 46.6 % 31.2(L) 28.4(L) 32.8(L)  Platelets 145 - 400 10e3/uL 431(H) 321 407(H)    CMP Latest Ref Rng 11/23/2014 05/05/2014 05/04/2014  Glucose 70 - 99 mg/dL 90 94 90  BUN 6 - 23 mg/dL 35(H) 32(H) 35(H)  Creatinine 0.40 - 1.20 mg/dL 1.89(H) 2.38(H) 2.29(H)  Sodium 135 - 145 mEq/L 141 137 141  Potassium 3.5 - 5.1 mEq/L 3.8 4.4 4.5  Chloride 96 - 112 mEq/L 102 100 104  CO2 19 - 32 mEq/L 31 28 28  Calcium 8.4 - 10.5 mg/dL 9.4 9.1 8.8  Total Protein 6.0 - 8.3 g/dL 7.0 - -  Total Bilirubin 0.2 - 1.2 mg/dL 0.6 - -  Alkaline Phos 39 - 117 U/L 105 - -  AST 0 - 37 U/L 22 - -  ALT 0 - 35 U/L 14 - -    Results for Goering, Carinna M (MRN [id number]) as of 12/29/2014 23:03  Ref. Range 12/29/2014 10:36  Iron Latest Ref Range: 41-142 ug/dL 77  UIBC Latest Ref Range: 120-384 ug/dL 180  TIBC Latest Ref Range: 236-444 ug/dL 258  %SAT Latest Ref Range: 21-57 % 30  Ferritin Latest Ref Range: 9-269 ng/ml 159    Studies:  No results found.   RADIOGRAPHIC STUDY No new study    ASSESSMENT: Gardenia M Albers 81 y.o. female with a history of Anemia in chronic renal disease  CKD (chronic kidney disease) stage 4, GFR 15-29 ml/min (HCC)   PLAN:  1. Anemia in chronic renal disease.   -She is clinically doing well, her hemoglobin today is 10.3 today. She has been able to maintain her hemoglobin well with Aranesp every 3 months. However she complains about fatigue, and would like to go back to Aranesp injection every 2 months.  -We again reviewed the benefit and the risks of Aranesp   injection, especially the risk of thrombosis, including heart attack and stroke. She voiced good understanding.  -Her iron study today is completely normal.  2. Chronic kidney disease.  --Avoid nephrotoxins and counseled on continued adequate hydration.  Her creatinine is 1.9 today and stable.    3. Gout  -I encouraged her to follow-up with her primary care physician, and discussed  if she would benefit from allopurinol to prevent gout flare  4. CAD, CHF, HTN, ARTHRITIS  -She will continue follow-up with her primary care physician   Follow-up.   --RTC in 6 months with repeat labs. She will continue Aranesp every 2 months  Patient had many questions, especially about her gout today. All questions were answered. The patient knows to call the clinic with any problems, questions or concerns. We can certainly see the patient much sooner if necessary.  I spent 20 minutes counseling the patient face to face. The total time spent in the appointment was 25 minutes.   [name]  12/29/2014

## 2014-12-30 ENCOUNTER — Other Ambulatory Visit: Payer: Self-pay | Admitting: Hematology

## 2014-12-30 ENCOUNTER — Telehealth: Payer: Self-pay | Admitting: Hematology

## 2015-01-02 ENCOUNTER — Ambulatory Visit (HOSPITAL_COMMUNITY)
Admission: RE | Admit: 2015-01-02 | Discharge: 2015-01-02 | Disposition: A | Discharge status: Home / Self Care | Payer: Medicare Other | Source: Ambulatory Visit | Attending: Cardiology | Admitting: Cardiology

## 2015-01-02 ENCOUNTER — Telehealth: Payer: Self-pay | Admitting: Hematology

## 2015-01-02 DIAGNOSIS — I6523 Occlusion and stenosis of bilateral carotid arteries: Secondary | ICD-10-CM | POA: Insufficient documentation

## 2015-01-02 DIAGNOSIS — I10 Essential (primary) hypertension: Secondary | ICD-10-CM | POA: Diagnosis not present

## 2015-01-02 DIAGNOSIS — E785 Hyperlipidemia, unspecified: Secondary | ICD-10-CM | POA: Diagnosis not present

## 2015-01-02 DIAGNOSIS — I6529 Occlusion and stenosis of unspecified carotid artery: Secondary | ICD-10-CM

## 2015-01-02 DIAGNOSIS — R0989 Other specified symptoms and signs involving the circulatory and respiratory systems: Secondary | ICD-10-CM | POA: Diagnosis not present

## 2015-01-02 NOTE — Telephone Encounter (Signed)
per pof to sch pt appt-gave ptcopy of avs-per pt req to mail

## 2015-01-03 ENCOUNTER — Encounter: Payer: Self-pay | Admitting: Family Medicine

## 2015-01-03 ENCOUNTER — Ambulatory Visit (INDEPENDENT_AMBULATORY_CARE_PROVIDER_SITE_OTHER): Payer: Medicare Other | Admitting: Family Medicine

## 2015-01-03 VITALS — BP 136/72 | HR 78 | Ht 62.0 in | Wt 171.0 lb

## 2015-01-03 DIAGNOSIS — I6529 Occlusion and stenosis of unspecified carotid artery: Secondary | ICD-10-CM | POA: Diagnosis not present

## 2015-01-03 DIAGNOSIS — M1712 Unilateral primary osteoarthritis, left knee: Secondary | ICD-10-CM | POA: Diagnosis not present

## 2015-01-03 DIAGNOSIS — G5702 Lesion of sciatic nerve, left lower limb: Secondary | ICD-10-CM | POA: Diagnosis not present

## 2015-01-03 NOTE — Assessment & Plan Note (Signed)
Continue to monitor

## 2015-01-03 NOTE — Progress Notes (Signed)
  [name] [place], [place] [address] Phone: [phone] Subjective:      CC: Left leg pain follow up  [id number] [name] [name] is a 81 y.o. female coming in with complaint of left leg pain. Patient was seen by me for degenerative meniscal tear as well as moderate arthritis of the knee. Continues to do well with conservative therapy. Nothing that is stopping her from activity. Continues to walk with a walker just because it makes her feel more confident and she is more active.  Patient was found to have more of a piriformis syndrome. Patient elected to have an injection. Tolerated procedure well. Didn't do much better overall. Patient does have some x-ray showing some L5-S1 pathology that can also be contributing. States that she is 70% better overall. Nothing that is stopping her from activities. Patient elected continue with conservative management at this time. No radicular symptoms down the legs. Still in a flexed position she has severe pain in the back though. Seems to get better with extension..   Review of Systems: No headache, visual changes, nausea, vomiting, diarrhea, constipation, dizziness, abdominal pain, skin rash, fevers, chills, night sweats, weight loss, swollen lymph nodes, body aches, joint swelling, muscle aches, chest pain, shortness of breath, mood changes.   Objective Blood pressure 136/72, pulse 78, height 5\' 2"  (1.575 m), weight 171 lb (77.565 kg), SpO2 97 %.  General: No apparent distress alert and oriented x3 mood and affect normal, dressed appropriately. Patient does appear somewhat pale HEENT: Pupils equal, extraocular movements intact  Respiratory: Patient's speak in full sentences and does not appear short of breath  Cardiovascular: No lower extremity edema, non tender, no erythema  Skin: Warm dry intact with no signs of infection or rash on extremities or on axial skeleton.  Abdomen: Soft nontender  Neuro:  Cranial nerves II through XII are intact, neurovascularly intact in all extremities with 2+ DTRs and 2+ pulses.  Lymph: No lymphadenopathy of posterior or anterior cervical chain or axillae bilaterally.  Gait ambulates with kyphosis and a walker MSK:  mild tender with near full range of motion and good stability and symmetric strength and tone of shoulders, elbows, wrist, hips bilaterally. Multiple osteoarthritic changes in multiple joints. Knee: Left Mild valgus deformity noted Mild medial joint line tenderness. Lacking last 5 of extension and less 5 of flexion Ligaments with solid consistent endpoints including ACL, PCL, LCL, MCL. Mild instability noted. negativeMcmurray's, Apley's, and Thessalonian tests. Non painful patellar compression. Patellar glide with minimal crepitus. Patellar and quadriceps tendons unremarkable. Hamstring and quadriceps strength is normal.   contralateral knee has some minimal tenderness to palpation as well.  . Back exam shows the patient does have mild increasing kyphosis. For flexion 25 extension of 15. Patient does have some limitation with range of motion side bending and rotation bilaterally but symmetric. Negative straight leg test. Neurovascular intact distally with full strength of the ankles. Deep tendon reflexes are intact. P significant improvement in range of motion and negative Faber's today.         Impression and Recommendations:     This case required medical decision making of moderate complexity.

## 2015-01-03 NOTE — Assessment & Plan Note (Signed)
Much better after injection. We can repeat every 3-4 weeks if necessary. I do think that there is some lumbar radiculopathy that is also playing a role. We discussed continuing the conservative therapy at this point and patient will follow-up in 6 weeks for further evaluation. Patient had significant number of questions we did discuss a small amount of an exercise prescription. After all this discussion patient once again does agree to continue with conservative therapy.  Spent  25 minutes with patient face-to-face and had greater than 50% of counseling including as described above in assessment and plan.

## 2015-01-03 NOTE — Patient Instructions (Signed)
Good to see you Gabapentin 200mg  at night if it does not make you drowsy.  Continue to stay active I like the walker We can repeat injection in buttocks if needed in 3 months or we can consider epidural in back if needed See me when you need me Happy holidays!

## 2015-01-03 NOTE — Progress Notes (Signed)
Pre visit review using our clinic review tool, if applicable. No additional management support is needed unless otherwise documented below in the visit note. 

## 2015-01-12 ENCOUNTER — Telehealth: Payer: Self-pay | Admitting: *Deleted

## 2015-01-12 DIAGNOSIS — M79671 Pain in right foot: Secondary | ICD-10-CM

## 2015-01-12 DIAGNOSIS — Z8739 Personal history of other diseases of the musculoskeletal system and connective tissue: Secondary | ICD-10-CM

## 2015-01-12 MED ORDER — PREDNISONE 10 MG PO TABS: 10.0000 mg | ORAL_TABLET | Freq: Three times a day (TID) | ORAL | Status: DC

## 2015-01-12 NOTE — Telephone Encounter (Signed)
OK to ref Prednisone Sch ROV pls Thx

## 2015-01-12 NOTE — Telephone Encounter (Signed)
Received call pt states she saw Dr. Linna Darner on 12/2, was having a gout flare-up. MD rx prednisone which she has taking. Pt states she thinks its still not cleared up. Her (R) foot id still swollen, but not like it was & not as red. Pt is wanting to know can she get another refill on the prednisone, and any recommendation on prevention of gout flare...Johny Chess

## 2015-01-12 NOTE — Telephone Encounter (Signed)
Called pt no answer LMOM rx has been sent to [facility], and if she continue having problem will need to make f/u appt...[name] [name]

## 2015-02-17 ENCOUNTER — Other Ambulatory Visit: Payer: Self-pay | Admitting: *Deleted

## 2015-02-17 ENCOUNTER — Telehealth: Payer: Self-pay | Admitting: Cardiovascular Disease

## 2015-02-17 MED ORDER — METOPROLOL TARTRATE 25 MG PO TABS: 12.5000 mg | ORAL_TABLET | Freq: Two times a day (BID) | ORAL | Status: DC

## 2015-02-17 NOTE — Telephone Encounter (Signed)
New message      *STAT* If patient is at the pharmacy, call can be transferred to refill team.   1. Which medications need to be refilled? (please list name of each medication and dose if known) metorpolol 25mg --1/2 in am and 1/2 in pm  2. Which pharmacy/location (including street and city if local pharmacy) is medication to be sent to? [email or web address]   3. Do they need a 30 day or 90 day supply? 90day supply Pt was getting the medication free while on a research study at [employer].  It has now ended and she needs a presc.

## 2015-02-17 NOTE — Telephone Encounter (Signed)
Rx sent in as requested. 

## 2015-03-03 ENCOUNTER — Other Ambulatory Visit: Payer: Self-pay | Admitting: Hematology

## 2015-03-06 ENCOUNTER — Other Ambulatory Visit (HOSPITAL_BASED_OUTPATIENT_CLINIC_OR_DEPARTMENT_OTHER): Payer: Medicare Other

## 2015-03-06 ENCOUNTER — Ambulatory Visit (HOSPITAL_BASED_OUTPATIENT_CLINIC_OR_DEPARTMENT_OTHER): Payer: Medicare Other

## 2015-03-06 ENCOUNTER — Other Ambulatory Visit: Payer: Self-pay | Admitting: *Deleted

## 2015-03-06 VITALS — BP 141/64 | HR 65 | Temp 98.3°F

## 2015-03-06 DIAGNOSIS — D631 Anemia in chronic kidney disease: Secondary | ICD-10-CM

## 2015-03-06 DIAGNOSIS — N189 Chronic kidney disease, unspecified: Secondary | ICD-10-CM

## 2015-03-06 LAB — CBC WITH DIFFERENTIAL/PLATELET
BASO%: 1.2 % (ref 0.0–2.0)
Basophils Absolute: 0.1 10*3/uL (ref 0.0–0.1)
EOS%: 3.9 % (ref 0.0–7.0)
Eosinophils Absolute: 0.2 10*3/uL (ref 0.0–0.5)
HEMATOCRIT: 31.5 % — AB (ref 34.8–46.6)
HEMOGLOBIN: 10.7 g/dL — AB (ref 11.6–15.9)
LYMPH#: 1.7 10*3/uL (ref 0.9–3.3)
LYMPH%: 27 % (ref 14.0–49.7)
MCH: 30.7 pg (ref 25.1–34.0)
MCHC: 33.8 g/dL (ref 31.5–36.0)
MCV: 90.8 fL (ref 79.5–101.0)
MONO#: 0.6 10*3/uL (ref 0.1–0.9)
MONO%: 10.4 % (ref 0.0–14.0)
NEUT%: 57.5 % (ref 38.4–76.8)
NEUTROS ABS: 3.5 10*3/uL (ref 1.5–6.5)
PLATELETS: 346 10*3/uL (ref 145–400)
RBC: 3.47 10*6/uL — ABNORMAL LOW (ref 3.70–5.45)
RDW: 13.2 % (ref 11.2–14.5)
WBC: 6.1 10*3/uL (ref 3.9–10.3)

## 2015-03-06 MED ORDER — DARBEPOETIN ALFA 200 MCG/0.4ML IJ SOSY
200.0000 ug | PREFILLED_SYRINGE | Freq: Once | INTRAMUSCULAR | Status: AC
  Administered 2015-03-06: 200 ug via SUBCUTANEOUS
  Filled 2015-03-06: qty 0.4

## 2015-03-06 MED ORDER — DARBEPOETIN ALFA-POLYSORBATE 500 MCG/ML IJ SOLN: 200.0000 ug | Freq: Once | INTRAMUSCULAR | Status: DC

## 2015-03-13 ENCOUNTER — Other Ambulatory Visit: Payer: Self-pay | Admitting: *Deleted

## 2015-03-13 MED ORDER — GABAPENTIN 100 MG PO CAPS: 100.0000 mg | ORAL_CAPSULE | Freq: Every day | ORAL | Status: DC

## 2015-03-13 NOTE — Telephone Encounter (Signed)
Refill done.  

## 2015-03-20 ENCOUNTER — Telehealth: Payer: Self-pay

## 2015-03-20 ENCOUNTER — Ambulatory Visit (INDEPENDENT_AMBULATORY_CARE_PROVIDER_SITE_OTHER): Payer: Medicare Other | Admitting: Internal Medicine

## 2015-03-20 ENCOUNTER — Telehealth: Payer: Self-pay | Admitting: Internal Medicine

## 2015-03-20 ENCOUNTER — Encounter: Payer: Self-pay | Admitting: Internal Medicine

## 2015-03-20 VITALS — BP 128/70 | HR 82 | Wt 175.0 lb

## 2015-03-20 DIAGNOSIS — I2510 Atherosclerotic heart disease of native coronary artery without angina pectoris: Secondary | ICD-10-CM | POA: Diagnosis not present

## 2015-03-20 DIAGNOSIS — I10 Essential (primary) hypertension: Secondary | ICD-10-CM | POA: Diagnosis not present

## 2015-03-20 DIAGNOSIS — I480 Paroxysmal atrial fibrillation: Secondary | ICD-10-CM

## 2015-03-20 DIAGNOSIS — D509 Iron deficiency anemia, unspecified: Secondary | ICD-10-CM

## 2015-03-20 DIAGNOSIS — E559 Vitamin D deficiency, unspecified: Secondary | ICD-10-CM | POA: Diagnosis not present

## 2015-03-20 DIAGNOSIS — R5382 Chronic fatigue, unspecified: Secondary | ICD-10-CM

## 2015-03-20 DIAGNOSIS — M5442 Lumbago with sciatica, left side: Secondary | ICD-10-CM

## 2015-03-20 DIAGNOSIS — I5020 Unspecified systolic (congestive) heart failure: Secondary | ICD-10-CM

## 2015-03-20 DIAGNOSIS — G8929 Other chronic pain: Secondary | ICD-10-CM

## 2015-03-20 NOTE — Assessment & Plan Note (Signed)
On Vit D 

## 2015-03-20 NOTE — Assessment & Plan Note (Signed)
Mild Iron rich foods

## 2015-03-20 NOTE — Progress Notes (Signed)
Pre visit review using our clinic review tool, if applicable. No additional management support is needed unless otherwise documented below in the visit note. 

## 2015-03-20 NOTE — Telephone Encounter (Signed)
Please call patient with additional information on medicare well visit.

## 2015-03-20 NOTE — Assessment & Plan Note (Signed)
Doing well 

## 2015-03-20 NOTE — Assessment & Plan Note (Signed)
On ASA, Toprol 

## 2015-03-20 NOTE — Progress Notes (Signed)
Subjective:  Patient ID: [name] [name], female    DOB: 1931/04/22  Age: 82 y.o. MRN: [id number]  CC: No chief complaint on file.   HPI [name] [name] presents for anemia, CAD, LBP f/u. PO Iron would cause nausea...  Outpatient Prescriptions Prior to Visit  Medication Sig Dispense Refill  . acetaminophen (TYLENOL) 650 MG CR tablet Take 325 mg by mouth every 8 (eight) hours as needed for pain (arthritis).     [name] albuterol (PROVENTIL HFA;VENTOLIN HFA) 108 (90 BASE) MCG/ACT inhaler Inhale into the lungs every 6 (six) hours as needed for wheezing or shortness of breath.    [name] aspirin 325 MG EC tablet Take 325 mg by mouth daily.    [name] atorvastatin (LIPITOR) 40 MG tablet Take 1 tablet (40 mg total) by mouth daily. (Patient taking differently: Take 40 mg by mouth every other day. ) 90 tablet 3  . b complex vitamins capsule Take 1 capsule by mouth daily.    . Biotin 5000 MCG CAPS Take 1 capsule by mouth daily.    . Cholecalciferol (EQL VITAMIN D3) 1000 UNITS tablet Take 1 tablet (1,000 Units total) by mouth daily. 100 tablet 3  . darbepoetin (ARANESP) 100 MCG/0.5ML SOLN injection Inject 100 mcg into the skin every 8 (eight) weeks.    . furosemide (LASIX) 20 MG tablet Take 1 tablet (20 mg total) by mouth daily. 30 tablet 11  . gabapentin (NEURONTIN) 100 MG capsule Take 1 capsule (100 mg total) by mouth at bedtime. 90 capsule 1  . Iron Polysacch Cmplx-B12-FA 150-0.025-1 MG CAPS Take 1 capsule by mouth daily. 30 each 5  . levothyroxine (SYNTHROID, LEVOTHROID) 50 MCG tablet TAKE 1 TABLET BY MOUTH EVERY DAY BEFORE BREAKFAST 90 tablet 3  . LORazepam (ATIVAN) 0.5 MG tablet Take 1 tablet (0.5 mg total) by mouth every 6 (six) hours as needed. for anxiety 30 tablet 3  . metoprolol tartrate (LOPRESSOR) 25 MG tablet Take 0.5 tablets (12.5 mg total) by mouth 2 (two) times daily. 90 tablet 1  . pantoprazole (PROTONIX) 40 MG tablet Take 40 mg by mouth daily.    . sennosides-docusate sodium (SENOKOT-S) 8.6-50 MG  tablet Take 1 tablet by mouth daily.     . predniSONE (DELTASONE) 10 MG tablet Take 1 tablet (10 mg total) by mouth 3 (three) times daily. (Patient not taking: Reported on 03/20/2015) 21 tablet 0   No facility-administered medications prior to visit.    ROS Review of Systems  Constitutional: Negative for chills, activity change, appetite change, fatigue and unexpected weight change.  HENT: Negative for congestion, mouth sores and sinus pressure.   Eyes: Negative for visual disturbance.  Respiratory: Negative for cough and chest tightness.   Gastrointestinal: Negative for nausea and abdominal pain.  Genitourinary: Negative for frequency, difficulty urinating and vaginal pain.  Musculoskeletal: Positive for back pain and gait problem.  Skin: Negative for pallor and rash.  Neurological: Negative for dizziness, tremors, weakness, numbness and headaches.  Psychiatric/Behavioral: Negative for confusion and sleep disturbance.    Objective:  BP 128/70 mmHg  Pulse 82  Wt 175 lb (79.379 kg)  SpO2 94%  BP Readings from Last 3 Encounters:  03/20/15 128/70  03/06/15 141/64  01/03/15 136/72    Wt Readings from Last 3 Encounters:  03/20/15 175 lb (79.379 kg)  01/03/15 171 lb (77.565 kg)  12/29/14 171 lb 11.2 oz (77.883 kg)    Physical Exam  Constitutional: She appears well-developed. No distress.  HENT:  Head: Normocephalic.  Right Ear: External ear normal.  Left Ear: External ear normal.  Nose: Nose normal.  Mouth/Throat: Oropharynx is clear and moist.  Eyes: Conjunctivae are normal. Pupils are equal, round, and reactive to light. Right eye exhibits no discharge. Left eye exhibits no discharge.  Neck: Normal range of motion. Neck supple. No JVD present. No tracheal deviation present. No thyromegaly present.  Cardiovascular: Normal rate, regular rhythm and normal heart sounds.   Pulmonary/Chest: No stridor. No respiratory distress. She has no wheezes.  Abdominal: Soft. Bowel sounds  are normal. She exhibits no distension and no mass. There is no tenderness. There is no rebound and no guarding.  Musculoskeletal: She exhibits tenderness. She exhibits no edema.  Lymphadenopathy:    She has no cervical adenopathy.  Neurological: She displays normal reflexes. No cranial nerve deficit. She exhibits normal muscle tone. Coordination abnormal.  Skin: No rash noted. No erythema.  Psychiatric: She has a normal mood and affect. Her behavior is normal. Judgment and thought content normal.  using a walker   Lab Results  Component Value Date   WBC 6.1 03/06/2015   HGB 10.7* 03/06/2015   HCT 31.5* 03/06/2015   PLT 346 03/06/2015   GLUCOSE 90 11/23/2014   CHOL 92 04/15/2013   TRIG 62 04/15/2013   HDL 40 04/15/2013   LDLCALC 30 04/15/2013   ALT 14 11/23/2014   AST 22 11/23/2014   NA 141 11/23/2014   K 3.8 11/23/2014   CL 102 11/23/2014   CREATININE 1.89* 11/23/2014   BUN 35* 11/23/2014   CO2 31 11/23/2014   TSH 1.76 11/23/2014   INR 0.98 05/03/2014    No results found.  Assessment & Plan:   Diagnoses and all orders for this visit:  Paroxysmal atrial fibrillation ([place])  Atherosclerosis of native coronary artery of native heart without angina pectoris  Essential hypertension  Vitamin D deficiency  Chronic fatigue  Systolic congestive heart failure, unspecified congestive heart failure chronicity (HCC)  Chronic left-sided low back pain with left-sided sciatica  Anemia, iron deficiency   I have discontinued [name] [name]'s predniSONE. I am also having her maintain her aspirin, sennosides-docusate sodium, acetaminophen, b complex vitamins, Biotin, furosemide, darbepoetin, Cholecalciferol, levothyroxine, atorvastatin, albuterol, pantoprazole, LORazepam, Iron Polysacch Cmplx-B12-FA, metoprolol tartrate, and gabapentin.  No orders of the defined types were placed in this encounter.     Follow-up: Return in about 3 months (around 06/17/2015) for a follow-up  visit.  [name], MD

## 2015-03-20 NOTE — Telephone Encounter (Signed)
Call to [name] [name] who was here in the office today. Has labs posted for May when she will see Dr. Alain Marion. Agreed to come in a week prior when she gets labs drawn and will come in for AWV same day around 10am

## 2015-03-20 NOTE — Patient Instructions (Signed)
Foods rich in iron include:  Red meat, pork and poultry.  Seafood.  Beans.  Dark green leafy vegetables, such as spinach.  Dried fruit, such as raisins and apricots.  Iron-fortified cereals, breads and pastas.  Peas.

## 2015-03-20 NOTE — Assessment & Plan Note (Signed)
Lopressor, Furosemide 

## 2015-03-20 NOTE — Assessment & Plan Note (Signed)
Chronic  On Lipitor, ASA

## 2015-03-20 NOTE — Assessment & Plan Note (Signed)
Use a walker Stretch

## 2015-03-20 NOTE — Assessment & Plan Note (Signed)
Better  

## 2015-03-20 NOTE — Assessment & Plan Note (Signed)
Furosemide, Lopressor 

## 2015-04-17 ENCOUNTER — Telehealth: Payer: Self-pay | Admitting: Internal Medicine

## 2015-04-17 NOTE — Telephone Encounter (Signed)
Pt is having another episode of gout. She says this seems to be coming back every month.  She is requesting some prednisone for this.  She also states her daughter is in the medical field and has told her she should be on preventative medicine for gout. Which is Indocen and allolurino.  Pharmacy is [facility] on [address]

## 2015-04-18 MED ORDER — COLCHICINE 0.6 MG PO TABS: 0.6000 mg | ORAL_TABLET | Freq: Every day | ORAL | Status: DC

## 2015-04-18 MED ORDER — FEBUXOSTAT 40 MG PO TABS: 40.0000 mg | ORAL_TABLET | Freq: Every day | ORAL | Status: DC

## 2015-04-18 NOTE — Telephone Encounter (Signed)
OK Uloric and Colchicine Indocin, Allopurinol may affect the kidneys

## 2015-04-18 NOTE — Telephone Encounter (Signed)
Notified pt w/MD response. Pt is wanting to know does she have to take both med. She states she is up to 11 pills a day. She don't mind taking 1 wanting to know which one is more effective to take if she choose to take just 1.../lmb

## 2015-04-18 NOTE — Telephone Encounter (Signed)
Uloric is to reduce uric acid levels. Colchicine is to prevent attacks. Start with Colchicine. Thx

## 2015-04-19 ENCOUNTER — Other Ambulatory Visit: Payer: Self-pay | Admitting: Cardiovascular Disease

## 2015-04-19 NOTE — Telephone Encounter (Signed)
Notified pt w/MD response.../lmb 

## 2015-04-21 ENCOUNTER — Other Ambulatory Visit: Payer: Self-pay

## 2015-04-21 DIAGNOSIS — Z1231 Encounter for screening mammogram for malignant neoplasm of breast: Secondary | ICD-10-CM

## 2015-05-04 ENCOUNTER — Ambulatory Visit: Payer: Medicare Other

## 2015-05-04 ENCOUNTER — Other Ambulatory Visit (HOSPITAL_BASED_OUTPATIENT_CLINIC_OR_DEPARTMENT_OTHER): Payer: Medicare Other

## 2015-05-04 DIAGNOSIS — N189 Chronic kidney disease, unspecified: Principal | ICD-10-CM

## 2015-05-04 DIAGNOSIS — D631 Anemia in chronic kidney disease: Secondary | ICD-10-CM

## 2015-05-04 LAB — CBC WITH DIFFERENTIAL/PLATELET
BASO%: 0.6 % (ref 0.0–2.0)
Basophils Absolute: 0 10*3/uL (ref 0.0–0.1)
EOS%: 4.7 % (ref 0.0–7.0)
Eosinophils Absolute: 0.2 10*3/uL (ref 0.0–0.5)
HEMATOCRIT: 33 % — AB (ref 34.8–46.6)
HGB: 11 g/dL — ABNORMAL LOW (ref 11.6–15.9)
LYMPH#: 1.4 10*3/uL (ref 0.9–3.3)
LYMPH%: 30.4 % (ref 14.0–49.7)
MCH: 30.2 pg (ref 25.1–34.0)
MCHC: 33.3 g/dL (ref 31.5–36.0)
MCV: 90.7 fL (ref 79.5–101.0)
MONO#: 0.7 10*3/uL (ref 0.1–0.9)
MONO%: 14.3 % — ABNORMAL HIGH (ref 0.0–14.0)
NEUT%: 50 % (ref 38.4–76.8)
NEUTROS ABS: 2.4 10*3/uL (ref 1.5–6.5)
PLATELETS: 350 10*3/uL (ref 145–400)
RBC: 3.64 10*6/uL — ABNORMAL LOW (ref 3.70–5.45)
RDW: 13.4 % (ref 11.2–14.5)
WBC: 4.7 10*3/uL (ref 3.9–10.3)

## 2015-05-04 MED ORDER — DARBEPOETIN ALFA-POLYSORBATE 500 MCG/ML IJ SOLN: 200.0000 ug | Freq: Once | INTRAMUSCULAR | Status: DC

## 2015-05-11 ENCOUNTER — Ambulatory Visit (INDEPENDENT_AMBULATORY_CARE_PROVIDER_SITE_OTHER): Payer: Medicare Other | Admitting: Family

## 2015-05-11 ENCOUNTER — Ambulatory Visit (INDEPENDENT_AMBULATORY_CARE_PROVIDER_SITE_OTHER)
Admission: RE | Admit: 2015-05-11 | Discharge: 2015-05-11 | Disposition: A | Discharge status: Home / Self Care | Payer: Medicare Other | Source: Ambulatory Visit | Attending: Family | Admitting: Family

## 2015-05-11 ENCOUNTER — Encounter: Payer: Self-pay | Admitting: Family

## 2015-05-11 VITALS — BP 140/90 | HR 79 | Temp 97.4°F | Ht 62.0 in | Wt 170.0 lb

## 2015-05-11 DIAGNOSIS — R05 Cough: Secondary | ICD-10-CM

## 2015-05-11 DIAGNOSIS — R0602 Shortness of breath: Secondary | ICD-10-CM | POA: Diagnosis not present

## 2015-05-11 DIAGNOSIS — I2510 Atherosclerotic heart disease of native coronary artery without angina pectoris: Secondary | ICD-10-CM

## 2015-05-11 DIAGNOSIS — J189 Pneumonia, unspecified organism: Secondary | ICD-10-CM

## 2015-05-11 DIAGNOSIS — R059 Cough, unspecified: Secondary | ICD-10-CM

## 2015-05-11 MED ORDER — ALBUTEROL SULFATE (2.5 MG/3ML) 0.083% IN NEBU
2.5000 mg | INHALATION_SOLUTION | Freq: Once | RESPIRATORY_TRACT | Status: AC
  Administered 2015-05-11: 2.5 mg via RESPIRATORY_TRACT

## 2015-05-11 MED ORDER — CEFDINIR 300 MG PO CAPS: 300.0000 mg | ORAL_CAPSULE | Freq: Two times a day (BID) | ORAL | Status: DC

## 2015-05-11 MED ORDER — GUAIFENESIN ER 600 MG PO TB12: 1200.0000 mg | ORAL_TABLET | Freq: Two times a day (BID) | ORAL | Status: DC | PRN

## 2015-05-11 MED ORDER — ALBUTEROL SULFATE HFA 108 (90 BASE) MCG/ACT IN AERS: 1.0000 | INHALATION_SPRAY | Freq: Four times a day (QID) | RESPIRATORY_TRACT | Status: AC | PRN

## 2015-05-11 NOTE — Patient Instructions (Addendum)
Your x-ray shows pneumonia. I would like for a close friend or family  to stay with you overnight to make sure that sure feeling better by tomorrow.  Please make f/u for Monday ( early next week.)  Please have repeat chest x-ray done in 4-6 weeks- orders are to placed so just go a straight radiology.  Use albuterol every 6 hours for first 24 hours to get good medication into the lungs and loosen congestion; after, you may use as needed and eventually stop all together when cough resolves.  Increase intake of clear fluids. Congestion is best treated by hydration, when mucus is wetter, it is thinner, less sticky, and easier to expel from the body, either through coughing up drainage, or by blowing your nose.   Get plenty of rest.   Use saline nasal drops and blow your nose frequently. Run a humidifier at night and elevate the head of the bed. Vicks Vapor rub will help with congestion and cough. Steam showers and sinus massage for congestion.   Use Acetaminophen or Ibuprofen as needed for fever or pain. Avoid second hand smoke. Even the smallest exposure will worsen symptoms.   Over the counter medications you can try include Delsym for cough, a decongestant for congestion, and Mucinex or Robitussin as an expectorant. Be sure to just get the plain Mucinex or Robitussin that just has one medication (Guaifenesen). We don't recommend the combination products. Note, be sure to drink two glasses of water with each dose of Mucinex as the medication will not work well without adequate hydration.   You can also try a teaspoon of honey to see if this will help reduce cough. Throat lozenges can sometimes be beneficial as well.    This illness will typically last 7 - 10 days.   Please follow up with our clinic if you develop a fever greater than 101 F, symptoms worsen, or do not resolve in the next week.

## 2015-05-11 NOTE — Progress Notes (Signed)
Pre visit review using our clinic review tool, if applicable. No additional management support is needed unless otherwise documented below in the visit note. 

## 2015-05-11 NOTE — Progress Notes (Signed)
Subjective:    Patient ID: [name] [name], female    DOB: 01/09/1932, 82 y.o.   MRN: [id number]   LEATTA JUMAN is a 82 y.o. female who presents today for an acute visit.    Cough This is a new problem. The current episode started 1 to 4 weeks ago. The problem has been unchanged. The problem occurs every few hours. The cough is non-productive. Associated symptoms include nasal congestion, rhinorrhea and wheezing. Pertinent negatives include no chest pain, chills, ear pain, fever, headaches, myalgias, rash, sore throat or shortness of breath. The symptoms are aggravated by lying down and exercise. Treatments tried: delsym. The treatment provided mild relief. Her past medical history is significant for asthma, bronchitis and pneumonia. There is no history of COPD.   Past Medical History  Diagnosis Date  . Unspecified essential hypertension     Dr [name]  . Other and unspecified hyperlipidemia   . Unspecified disorder resulting from impaired renal function   . Diverticulosis of colon with hemorrhage   . Unspecified vitamin D deficiency   . Unspecified asthma(493.90)   . Persistent disorder of initiating or maintaining sleep   . Lumbago   . Esophageal reflux   . Diverticulosis of colon (without mention of hemorrhage)   . Depressive disorder, not elsewhere classified   . Anxiety state, unspecified   . Anemia, unspecified   . Unspecified osteomyelitis, site unspecified   . Myocardial infarction ([place])   . Thyroid disease    Codeine sulfate; Darvon; Propoxyphene napsylate; Sulfacetamide sodium-sulfur; Sulfamethoxazole-trimethoprim; and Zolpidem Current Outpatient Prescriptions on File Prior to Visit  Medication Sig Dispense Refill  . acetaminophen (TYLENOL) 650 MG CR tablet Take 325 mg by mouth every 8 (eight) hours as needed for pain (arthritis).     [name] aspirin 325 MG EC tablet Take 325 mg by mouth daily.    [name] atorvastatin (LIPITOR) 40 MG tablet Take 1 tablet (40 mg total) by mouth  daily. (Patient taking differently: Take 40 mg by mouth every other day. ) 90 tablet 3  . b complex vitamins capsule Take 1 capsule by mouth daily.    . Biotin 5000 MCG CAPS Take 1 capsule by mouth daily.    . Cholecalciferol (EQL VITAMIN D3) 1000 UNITS tablet Take 1 tablet (1,000 Units total) by mouth daily. 100 tablet 3  . colchicine 0.6 MG tablet Take 1 tablet (0.6 mg total) by mouth daily. 30 tablet 11  . darbepoetin (ARANESP) 100 MCG/0.5ML SOLN injection Inject 100 mcg into the skin every 8 (eight) weeks.    . febuxostat (ULORIC) 40 MG tablet Take 1 tablet (40 mg total) by mouth daily. 30 tablet 11  . furosemide (LASIX) 20 MG tablet TAKE 1 TABLET BY MOUTH DAILY 30 tablet 5  . gabapentin (NEURONTIN) 100 MG capsule Take 1 capsule (100 mg total) by mouth at bedtime. 90 capsule 1  . Iron Polysacch Cmplx-B12-FA 150-0.025-1 MG CAPS Take 1 capsule by mouth daily. 30 each 5  . levothyroxine (SYNTHROID, LEVOTHROID) 50 MCG tablet TAKE 1 TABLET BY MOUTH EVERY DAY BEFORE BREAKFAST 90 tablet 3  . LORazepam (ATIVAN) 0.5 MG tablet Take 1 tablet (0.5 mg total) by mouth every 6 (six) hours as needed. for anxiety 30 tablet 3  . metoprolol tartrate (LOPRESSOR) 25 MG tablet Take 0.5 tablets (12.5 mg total) by mouth 2 (two) times daily. 90 tablet 1  . pantoprazole (PROTONIX) 40 MG tablet Take 40 mg by mouth daily.    . sennosides-docusate sodium (SENOKOT-S)  8.6-50 MG tablet Take 1 tablet by mouth daily.      No current facility-administered medications on file prior to visit.    Social History  Substance Use Topics  . Smoking status: Never Smoker   . Smokeless tobacco: Never Used  . Alcohol Use: 0.0 oz/week    1-2 Shots of liquor per week    Review of Systems  Constitutional: Negative for fever, chills and unexpected weight change.  HENT: Positive for congestion and rhinorrhea. Negative for ear pain, sinus pressure and sore throat.   Eyes: Negative for visual disturbance.  Respiratory: Positive for  cough and wheezing. Negative for shortness of breath.   Cardiovascular: Negative for chest pain, palpitations and leg swelling.  Gastrointestinal: Negative for nausea and vomiting.  Musculoskeletal: Negative for myalgias and arthralgias.  Skin: Negative for rash.  Neurological: Negative for headaches.  Hematological: Negative for adenopathy.      Objective:    BP 140/90 mmHg  Pulse 79  Temp(Src) 97.4 F (36.3 C) (Oral)  Ht 5\' 2"  (X33443 m)  Wt 579FGE lb (77.111 kg)  BMI 31.09 kg/m2  SpO2 94%   Physical Exam  Constitutional: She appears well-developed and well-nourished.  HENT:  Head: Normocephalic and atraumatic.  Right Ear: Hearing, tympanic membrane, external ear and ear canal normal. No drainage, swelling or tenderness. No foreign bodies. Tympanic membrane is not erythematous and not bulging. No middle ear effusion. No decreased hearing is noted.  Left Ear: Hearing, tympanic membrane, external ear and ear canal normal. No drainage, swelling or tenderness. No foreign bodies. Tympanic membrane is not erythematous and not bulging.  No middle ear effusion. No decreased hearing is noted.  Nose: Nose normal. No rhinorrhea. Right sinus exhibits no maxillary sinus tenderness and no frontal sinus tenderness. Left sinus exhibits no maxillary sinus tenderness and no frontal sinus tenderness.  Mouth/Throat: Uvula is midline, oropharynx is clear and moist and mucous membranes are normal. No oropharyngeal exudate, posterior oropharyngeal edema, posterior oropharyngeal erythema or tonsillar abscesses.  Eyes: Conjunctivae are normal.  Cardiovascular: Regular rhythm, normal heart sounds and normal pulses.   Pulmonary/Chest: Effort normal. She has wheezes. She has no rhonchi. She has rales.  Crackles bilateral RMF, RLF, and LLF. Worse on right.   Lymphadenopathy:       Head (right side): No submental, no submandibular, no tonsillar, no preauricular, no posterior auricular and no occipital adenopathy  present.       Head (left side): No submental, no submandibular, no tonsillar, no preauricular, no posterior auricular and no occipital adenopathy present.    She has no cervical adenopathy.  Neurological: She is alert.  Skin: Skin is warm and dry.  Psychiatric: She has a normal mood and affect. Her speech is normal and behavior is normal. Thought content normal.  Vitals reviewed.  Patient felt significantly better after albuterol treatment. Lung sounds increased, crackles persist RLL.      Assessment & Plan:   1. PNA  - DG Chest 2 View- suspect PNA left lung base.   -Omnicef 300 BIG 10 days - guaiFENesin (MUCINEX) 600 MG 12 hr tablet; Take 2 tablets (1,200 mg total) by mouth 2 (two) times daily as needed for cough or to loosen phlegm.  Dispense: 60 tablet; Refill: 1 - albuterol (PROVENTIL HFA;VENTOLIN HFA) 108 (90 Base) MCG/ACT inhaler; Inhale 1-2 puffs into the lungs every 6 (six) hours as needed for wheezing or shortness of breath.  Dispense: 1 Inhaler; Refill: 1   I have changed Ms. Sanford's albuterol.  I am also having her start on guaiFENesin and cefdinir. Additionally, I am having her maintain her aspirin, sennosides-docusate sodium, acetaminophen, b complex vitamins, Biotin, darbepoetin, Cholecalciferol, levothyroxine, atorvastatin, pantoprazole, LORazepam, Iron Polysacch Cmplx-B12-FA, metoprolol tartrate, gabapentin, colchicine, febuxostat, and furosemide. We administered albuterol.   Meds ordered this encounter  Medications  . guaiFENesin (MUCINEX) 600 MG 12 hr tablet    Sig: Take 2 tablets (1,200 mg total) by mouth 2 (two) times daily as needed for cough or to loosen phlegm.    Dispense:  60 tablet    Refill:  1    Order Specific Question:  Supervising Provider    Answer:  Cassandria Anger [[id number]]  . albuterol (PROVENTIL HFA;VENTOLIN HFA) 108 (90 Base) MCG/ACT inhaler    Sig: Inhale 1-2 puffs into the lungs every 6 (six) hours as needed for wheezing or shortness of  breath.    Dispense:  1 Inhaler    Refill:  1    Order Specific Question:  Supervising Provider    Answer:  Cassandria Anger [[id number]]  . albuterol (PROVENTIL) (2.5 MG/3ML) 0.083% nebulizer solution 2.5 mg    Sig:   . cefdinir (OMNICEF) 300 MG capsule    Sig: Take 1 capsule (300 mg total) by mouth 2 (two) times daily.    Dispense:  20 capsule    Refill:  0    Order Specific Question:  Supervising Provider    Answer:  Cassandria Anger [[id number]]    Start medications as prescribed and explained to patient on After Visit Summary ( AVS). Risks, benefits, and alternatives of the medications and treatment plan prescribed today were discussed, and patient expressed understanding.   Education regarding symptom management and diagnosis given to patient.   Follow-up:Plan follow-up as discussed or as needed if any worsening symptoms or change in condition. F/u next week  Continue to follow with [name], MD for routine health maintenance.   Eulogio Ditch and I agreed with plan.   Mable Paris, FNP  d 

## 2015-05-12 ENCOUNTER — Ambulatory Visit: Payer: Medicare Other

## 2015-05-12 ENCOUNTER — Telehealth: Payer: Self-pay

## 2015-05-12 NOTE — Telephone Encounter (Signed)
Patient came in for teaching on her inhaler. Instructions and help were given by both myself and tamara.

## 2015-05-15 ENCOUNTER — Telehealth: Payer: Self-pay

## 2015-05-15 ENCOUNTER — Encounter: Payer: Self-pay | Admitting: Family

## 2015-05-15 ENCOUNTER — Ambulatory Visit (INDEPENDENT_AMBULATORY_CARE_PROVIDER_SITE_OTHER): Payer: Medicare Other | Admitting: Family

## 2015-05-15 VITALS — BP 140/80 | HR 98 | Temp 98.4°F | Resp 20 | Wt 170.0 lb

## 2015-05-15 DIAGNOSIS — M791 Myalgia, unspecified site: Secondary | ICD-10-CM

## 2015-05-15 DIAGNOSIS — M109 Gout, unspecified: Secondary | ICD-10-CM

## 2015-05-15 DIAGNOSIS — I2510 Atherosclerotic heart disease of native coronary artery without angina pectoris: Secondary | ICD-10-CM | POA: Diagnosis not present

## 2015-05-15 DIAGNOSIS — M10079 Idiopathic gout, unspecified ankle and foot: Secondary | ICD-10-CM

## 2015-05-15 DIAGNOSIS — J690 Pneumonitis due to inhalation of food and vomit: Secondary | ICD-10-CM | POA: Diagnosis not present

## 2015-05-15 MED ORDER — DICLOFENAC SODIUM 1 % TD GEL: 4.0000 g | Freq: Four times a day (QID) | TRANSDERMAL | Status: DC

## 2015-05-15 MED ORDER — PREDNISONE 10 MG PO TABS: ORAL_TABLET | ORAL | Status: DC

## 2015-05-15 NOTE — Patient Instructions (Addendum)
Keep up the good work, you're doing great. You may try Voltaren gel for that right sided rib pain from coughing. Please use colchicine with food. Prednisone taper for gout.  If there is no improvement in your symptoms, or if there is any worsening of symptoms, or if you have any additional concerns, please return for re-evaluation; or, if we are closed, consider going to the Emergency Room for evaluation if symptoms urgent.  Gout Gout is an inflammatory arthritis caused by a buildup of uric acid crystals in the joints. Uric acid is a chemical that is normally present in the blood. When the level of uric acid in the blood is too high it can form crystals that deposit in your joints and tissues. This causes joint redness, soreness, and swelling (inflammation). Repeat attacks are common. Over time, uric acid crystals can form into masses (tophi) near a joint, destroying bone and causing disfigurement. Gout is treatable and often preventable. CAUSES  The disease begins with elevated levels of uric acid in the blood. Uric acid is produced by your body when it breaks down a naturally found substance called purines. Certain foods you eat, such as meats and fish, contain high amounts of purines. Causes of an elevated uric acid level include:  Being passed down from parent to child (heredity).  Diseases that cause increased uric acid production (such as obesity, psoriasis, and certain cancers).  Excessive alcohol use.  Diet, especially diets rich in meat and seafood.  Medicines, including certain cancer-fighting medicines (chemotherapy), water pills (diuretics), and aspirin.  Chronic kidney disease. The kidneys are no longer able to remove uric acid well.  Problems with metabolism. Conditions strongly associated with gout include:  Obesity.  High blood pressure.  High cholesterol.  Diabetes. Not everyone with elevated uric acid levels gets gout. It is not understood why some people get gout and  others do not. Surgery, joint injury, and eating too much of certain foods are some of the factors that can lead to gout attacks. SYMPTOMS   An attack of gout comes on quickly. It causes intense pain with redness, swelling, and warmth in a joint.  Fever can occur.  Often, only one joint is involved. Certain joints are more commonly involved:  Base of the big toe.  Knee.  Ankle.  Wrist.  Finger. Without treatment, an attack usually goes away in a few days to weeks. Between attacks, you usually will not have symptoms, which is different from many other forms of arthritis. DIAGNOSIS  Your caregiver will suspect gout based on your symptoms and exam. In some cases, tests may be recommended. The tests may include:  Blood tests.  Urine tests.  X-rays.  Joint fluid exam. This exam requires a needle to remove fluid from the joint (arthrocentesis). Using a microscope, gout is confirmed when uric acid crystals are seen in the joint fluid. TREATMENT  There are two phases to gout treatment: treating the sudden onset (acute) attack and preventing attacks (prophylaxis).  Treatment of an Acute Attack.  Medicines are used. These include anti-inflammatory medicines or steroid medicines.  An injection of steroid medicine into the affected joint is sometimes necessary.  The painful joint is rested. Movement can worsen the arthritis.  You may use warm or cold treatments on painful joints, depending which works best for you.  Treatment to Prevent Attacks.  If you suffer from frequent gout attacks, your caregiver may advise preventive medicine. These medicines are started after the acute attack subsides. These medicines either  help your kidneys eliminate uric acid from your body or decrease your uric acid production. You may need to stay on these medicines for a very long time.  The early phase of treatment with preventive medicine can be associated with an increase in acute gout attacks. For  this reason, during the first few months of treatment, your caregiver may also advise you to take medicines usually used for acute gout treatment. Be sure you understand your caregiver's directions. Your caregiver may make several adjustments to your medicine dose before these medicines are effective.  Discuss dietary treatment with your caregiver or dietitian. Alcohol and drinks high in sugar and fructose and foods such as meat, poultry, and seafood can increase uric acid levels. Your caregiver or dietitian can advise you on drinks and foods that should be limited. HOME CARE INSTRUCTIONS   Do not take aspirin to relieve pain. This raises uric acid levels.  Only take over-the-counter or prescription medicines for pain, discomfort, or fever as directed by your caregiver.  Rest the joint as much as possible. When in bed, keep sheets and blankets off painful areas.  Keep the affected joint raised (elevated).  Apply warm or cold treatments to painful joints. Use of warm or cold treatments depends on which works best for you.  Use crutches if the painful joint is in your leg.  Drink enough fluids to keep your urine clear or pale yellow. This helps your body get rid of uric acid. Limit alcohol, sugary drinks, and fructose drinks.  Follow your dietary instructions. Pay careful attention to the amount of protein you eat. Your daily diet should emphasize fruits, vegetables, whole grains, and fat-free or low-fat milk products. Discuss the use of coffee, vitamin C, and cherries with your caregiver or dietitian. These may be helpful in lowering uric acid levels.  Maintain a healthy body weight. SEEK MEDICAL CARE IF:   You develop diarrhea, vomiting, or any side effects from medicines.  You do not feel better in 24 hours, or you are getting worse. SEEK IMMEDIATE MEDICAL CARE IF:   Your joint becomes suddenly more tender, and you have chills or a fever. MAKE SURE YOU:   Understand these  instructions.  Will watch your condition.  Will get help right away if you are not doing well or get worse.   This information is not intended to replace advice given to you by your health care provider. Make sure you discuss any questions you have with your health care provider.   Document Released: 01/05/2000 Document Revised: 01/28/2014 Document Reviewed: 08/21/2011 Elsevier Interactive Patient Education 2016 [employer].   Gout diet: What's allowed, what's not By [facility] Staff  Definition Gout, a painful form of arthritis, occurs when high levels of uric acid in the blood cause crystals to form and accumulate around a joint. Uric acid is produced when the body breaks down a chemical called purine. Purine occurs naturally in your body, but it's also found in certain foods. Uric acid is eliminated from the body in urine. A gout diet may help decrease uric acid levels in the blood. While a gout diet is not a cure, it may lower the risk of recurring painful gout attacks and slow the progression of joint damage. Medication also is needed to manage pain and to lower levels of uric acid. Purpose A little history Gout has been associated for centuries with overindulgence in meats, seafood and alcohol. The condition was, in fact, considered a disease of the wealthiest  people - those who could afford such eating habits. And long before the cause of gout was understood, doctors had observed some benefit of a restricted diet on gout management. For many years, treatment for gout focused on eliminating all foods that had moderate to high amounts of purine. The list of foods to avoid was long, which made the diet difficult to follow. Current understanding More recent research on gout has created a clearer picture of the role of diet in disease management. Some foods should be avoided, but not all foods with purines should be eliminated. And some foods should be included in your diet to control  uric acid levels. The purpose of a gout diet today is to address all factors related to disease risk and management. Above all, the goals are a healthy weight and healthy eating - a message that applies to lowering the risk of many diseases. Diet details The general principles of a gout diet are essentially the same as recommendations for a balanced, healthy diet: Weight loss. Being overweight increases the risk of developing gout, and losing weight lowers the risk of gout. Research suggests that reducing the number of calories and losing weight - even without a purine-restricted diet - lowers uric acid levels and reduces the number of gout attacks. Losing weight also lessens the overall stress on joints.  Complex carbs. Eat more fruits, vegetables and whole grains, which provide complex carbohydrates. Avoid foods such as white bread, cakes, candy, sugar-sweetened beverages and products with high-fructose corn syrup.  Water. Keep yourself hydrated by drinking water. An increase in water consumption has been linked to fewer gout attacks. Aim for eight to 16 glasses of fluids a day with at least half of that as water. A glass is 8 ounces (237 milliliters). Talk to your doctor about appropriate fluid intake goals for you.  Fats. Cut back on saturated fats from red meats, fatty poultry and high-fat dairy products.  Proteins. Limit daily proteins from lean meat, fish and poultry to 4 to 6 ounces (113 to 170 grams). Add protein to your diet with low-fat or fat-free dairy products, such as low-fat yogurt or skim milk, which are associated with reduced uric acid levels. Recommendations for specific foods or supplements include the following: High-purine vegetables. Studies have shown that vegetables high in purines do not increase the risk of gout or recurring gout attacks. A healthy diet based on lots of fruits and vegetables can include high-purine vegetables, such as asparagus, spinach, peas, cauliflower or  mushrooms. You can also eat beans or lentils, which are moderately high in purines but are also a good source of protein.  Organ and glandular meats. Avoid meats such as liver, kidney and sweetbreads, which have high purine levels and contribute to high blood levels of uric acid.  Selected seafood. Avoid the following types of seafood, which are higher in purines than others: anchovies, herring, sardines, mussels, scallops, trout, haddock, mackerel and tuna.  Alcohol. The metabolism of alcohol in your body is thought to increase uric acid production, and alcohol contributes to dehydration. Beer is associated with an increased risk of gout and recurring attacks, as are distilled liquors to some extent. The effect of wine is not as well-understood. If you drink alcohol, talk to your doctor about what is appropriate for you.  Vitamin C. Vitamin C may help lower uric acid levels. Talk to your doctor about whether a 500-milligram vitamin C supplement fits into your diet and medication plan.  Coffee. Some research  suggests that moderate coffee consumption may be associated with a reduced risk of gout, particularly with regular caffeinated coffee. Drinking coffee may not be appropriate for other medical conditions. Talk to your doctor about how much coffee is right for you.  Cherries. There is some evidence that eating cherries is associated with a reduced risk of gout attacks. A sample menu Here's a look at what you might eat during a typical day on a gout diet: Breakfast Whole-grain, unsweetened cereal with skim or low-fat milk  1 cup fresh strawberries  Coffee  Water Lunch Roasted chicken breast slices (2 ounces) on a whole-grain roll with mustard  Mixed green salad with balsamic vinegar and olive oil dressing  Skim or low-fat milk  Water Afternoon snack 1 cup fresh cherries  Water Dinner Roasted salmon (3-4 ounces)  Roasted or steamed green beans  1/2 cup whole-grain pasta with olive oil and  lemon pepper  Water  Low-fat yogurt  1 cup fresh melon  Caffeine-free beverage, such as herbal tea

## 2015-05-15 NOTE — Progress Notes (Signed)
Subjective:    Patient ID: [name] [name], female    DOB: 1931-02-20, 82 y.o.   MRN: [id number]   [name] [name] is a 82 y.o. female who presents today for an acute visit.    HPI Comments: Patient here for reevaluation after being diagnosed with left lobe pneumonia last week. Started on omnicef and mucinex.Daughter has been staying with her.  Feeling much better.   Also complains of gout pain for past couple of days in both great toes. Red. Trace swelling on dorsal aspect of feet. Preventative colchicine causes GI upset and she has stopped taking a few days ago.  Denies fever, SOB. Denies use of alcohol or liver meats.   Past Medical History  Diagnosis Date  . Unspecified essential hypertension     Dr [name]  . Other and unspecified hyperlipidemia   . Unspecified disorder resulting from impaired renal function   . Diverticulosis of colon with hemorrhage   . Unspecified vitamin D deficiency   . Unspecified asthma(493.90)   . Persistent disorder of initiating or maintaining sleep   . Lumbago   . Esophageal reflux   . Diverticulosis of colon (without mention of hemorrhage)   . Depressive disorder, not elsewhere classified   . Anxiety state, unspecified   . Anemia, unspecified   . Unspecified osteomyelitis, site unspecified   . Myocardial infarction ([place])   . Thyroid disease    Codeine sulfate; Darvon; Propoxyphene napsylate; Sulfacetamide sodium-sulfur; Sulfamethoxazole-trimethoprim; and Zolpidem Current Outpatient Prescriptions on File Prior to Visit  Medication Sig Dispense Refill  . acetaminophen (TYLENOL) 650 MG CR tablet Take 325 mg by mouth every 8 (eight) hours as needed for pain (arthritis).     [name] albuterol (PROVENTIL HFA;VENTOLIN HFA) 108 (90 Base) MCG/ACT inhaler Inhale 1-2 puffs into the lungs every 6 (six) hours as needed for wheezing or shortness of breath. 1 Inhaler 1  . aspirin 325 MG EC tablet Take 325 mg by mouth daily.    [name] atorvastatin (LIPITOR) 40 MG tablet  Take 1 tablet (40 mg total) by mouth daily. (Patient taking differently: Take 40 mg by mouth every other day. ) 90 tablet 3  . b complex vitamins capsule Take 1 capsule by mouth daily.    . Biotin 5000 MCG CAPS Take 1 capsule by mouth daily.    . cefdinir (OMNICEF) 300 MG capsule Take 1 capsule (300 mg total) by mouth 2 (two) times daily. 20 capsule 0  . Cholecalciferol (EQL VITAMIN D3) 1000 UNITS tablet Take 1 tablet (1,000 Units total) by mouth daily. 100 tablet 3  . darbepoetin (ARANESP) 100 MCG/0.5ML SOLN injection Inject 100 mcg into the skin every 8 (eight) weeks.    . febuxostat (ULORIC) 40 MG tablet Take 1 tablet (40 mg total) by mouth daily. 30 tablet 11  . furosemide (LASIX) 20 MG tablet TAKE 1 TABLET BY MOUTH DAILY 30 tablet 5  . levothyroxine (SYNTHROID, LEVOTHROID) 50 MCG tablet TAKE 1 TABLET BY MOUTH EVERY DAY BEFORE BREAKFAST 90 tablet 3  . metoprolol tartrate (LOPRESSOR) 25 MG tablet Take 0.5 tablets (12.5 mg total) by mouth 2 (two) times daily. 90 tablet 1  . pantoprazole (PROTONIX) 40 MG tablet Take 40 mg by mouth daily.    . sennosides-docusate sodium (SENOKOT-S) 8.6-50 MG tablet Take 1 tablet by mouth daily.      No current facility-administered medications on file prior to visit.    Social History  Substance Use Topics  . Smoking status: Never Smoker   .  Smokeless tobacco: Never Used  . Alcohol Use: 0.0 oz/week    1-2 Shots of liquor per week    Review of Systems  Constitutional: Negative for fever, chills and unexpected weight change.  HENT: Negative for congestion, ear pain, sinus pressure and sore throat.   Eyes: Negative for visual disturbance.  Respiratory: Negative for cough, shortness of breath and wheezing.   Cardiovascular: Negative for chest pain, palpitations and leg swelling.  Gastrointestinal: Negative for nausea and vomiting.  Musculoskeletal: Negative for myalgias and arthralgias.  Skin: Negative for rash.  Neurological: Negative for headaches.    Hematological: Negative for adenopathy.      Objective:    BP 140/80 mmHg  Pulse 98  Temp(Src) 98.4 F (36.9 C) (Oral)  Resp 20  Wt 170 lb (77.111 kg)  SpO2 98%   Physical Exam  Constitutional: She appears well-developed and well-nourished.  Eyes: Conjunctivae are normal.  Cardiovascular: Normal rate, regular rhythm, normal heart sounds and normal pulses.   Trace non pitting edema dorsal aspect of bilateral feet. Skin intact.   Pulmonary/Chest: Effort normal and breath sounds normal. She has no wheezes. She has no rhonchi. She has no rales.  Musculoskeletal:       Back:       Feet:  Eyrthema and marked tenderness bilateral great toes.   Muscle tenderness noted right mid thoracic. No swelling, ecchymosis, deformity, rash noted.   Neurological: She is alert.  Skin: Skin is warm and dry.  Psychiatric: She has a normal mood and affect. Her speech is normal and behavior is normal. Thought content normal.  Vitals reviewed.      Assessment & Plan:   1. Muscle pain Suspect muscle spasm from coughing. No fracture seen on CXR last week.  - diclofenac sodium (VOLTAREN) 1 % GEL; Apply 4 g topically 4 (four) times daily.  Dispense: 1 Tube; Refill: 3  2. Acute gout of foot, unspecified cause, unspecified laterality Acute on chronic. Not a candidate for PO NSAIDs. Will restart colchicine after prednisone taper.   - diclofenac sodium (VOLTAREN) 1 % GEL; Apply 4 g topically 4 (four) times daily.  Dispense: 1 Tube; Refill: 3 - predniSONE (DELTASONE) 10 MG tablet; Take 4 tablets ( total 40 mg) by mouth for 2 days; take 3 tablets ( total 30 mg) by mouth for 2 days; take 2 tablets ( total 20 mg) by mouth for 1 day; take 1 tablet ( total 10 mg) by mouth for 1 day.  Dispense: 17 tablet; Refill: 0  3). PNA- improving, stable.   I have discontinued [name]'s LORazepam, Iron Polysacch Cmplx-B12-FA, gabapentin, colchicine, and guaiFENesin. I am also having her start on diclofenac sodium  and predniSONE. Additionally, I am having her maintain her aspirin, sennosides-docusate sodium, acetaminophen, b complex vitamins, Biotin, darbepoetin, Cholecalciferol, levothyroxine, atorvastatin, pantoprazole, metoprolol tartrate, febuxostat, furosemide, albuterol, and cefdinir.   Meds ordered this encounter  Medications  . diclofenac sodium (VOLTAREN) 1 % GEL    Sig: Apply 4 g topically 4 (four) times daily.    Dispense:  1 Tube    Refill:  3    Order Specific Question:  Supervising Provider    Answer:  Cassandria Anger [[id number]]  . predniSONE (DELTASONE) 10 MG tablet    Sig: Take 4 tablets ( total 40 mg) by mouth for 2 days; take 3 tablets ( total 30 mg) by mouth for 2 days; take 2 tablets ( total 20 mg) by mouth for 1 day; take 1 tablet (  total 10 mg) by mouth for 1 day.    Dispense:  17 tablet    Refill:  0    Order Specific Question:  Supervising Provider    Answer:  Cassandria Anger [[id number]]     Start medications as prescribed and explained to patient on After Visit Summary ( AVS). Risks, benefits, and alternatives of the medications and treatment plan prescribed today were discussed, and patient expressed understanding.   Education regarding symptom management and diagnosis given to patient.   Follow-up:Plan follow-up as discussed or as needed if any worsening symptoms or change in condition. No Follow-up on file.   Continue to follow with [name], MD for routine health maintenance.   Eulogio Ditch and I agreed with plan.   Mable Paris, FNP  25 minutes spent with patient. > 50% of time spent counseling patient related to gout and PNA diagnosis.

## 2015-05-15 NOTE — Progress Notes (Signed)
Pre visit review using our clinic review tool, if applicable. No additional management support is needed unless otherwise documented below in the visit note. 

## 2015-05-15 NOTE — Telephone Encounter (Signed)
Patient say one of the RX didn't know show up at the pharmacy. She should of had two but only had one.

## 2015-05-15 NOTE — Telephone Encounter (Signed)
OV today 05/15/15 - notes indicated to restart colchicine after the prednisone taper. Prednisone received at the pharmacy but colchicine not sent. (patient does have [facility]lor[facility] on Med list).

## 2015-05-16 NOTE — Telephone Encounter (Signed)
Pam (pt dtr) contacted and she confirmed that rx's have been picked up.

## 2015-05-16 NOTE — Telephone Encounter (Signed)
Prednisone rx was printed, do you know if it was faxed.

## 2015-05-29 ENCOUNTER — Telehealth: Payer: Self-pay

## 2015-05-29 NOTE — Telephone Encounter (Signed)
Call to given number and dtr referred to Ms. Fullen number at [phone]; Stated that my schedule has changed and I need to reschedule her Friday 10am apt on the 19th as I will be at [place].  LVM to return call

## 2015-05-29 NOTE — Telephone Encounter (Signed)
Mrs. Cumings called back and rescheduled apt for 11/17 at 11am for AWV with Manuela Schwartz

## 2015-05-30 ENCOUNTER — Other Ambulatory Visit: Payer: Self-pay | Admitting: Internal Medicine

## 2015-05-30 ENCOUNTER — Ambulatory Visit: Payer: Self-pay

## 2015-05-30 NOTE — Telephone Encounter (Signed)
Patient signed a DPR to allow her daughter, Desmond Lope, to have access to her medical records/ information 04-13-09

## 2015-06-01 ENCOUNTER — Ambulatory Visit
Admission: RE | Admit: 2015-06-01 | Discharge: 2015-06-01 | Disposition: A | Discharge status: Home / Self Care | Payer: Medicare Other | Source: Ambulatory Visit

## 2015-06-01 DIAGNOSIS — Z1231 Encounter for screening mammogram for malignant neoplasm of breast: Secondary | ICD-10-CM | POA: Diagnosis not present

## 2015-06-06 ENCOUNTER — Encounter: Payer: Self-pay | Admitting: Internal Medicine

## 2015-06-06 ENCOUNTER — Ambulatory Visit (INDEPENDENT_AMBULATORY_CARE_PROVIDER_SITE_OTHER): Payer: Medicare Other | Admitting: Internal Medicine

## 2015-06-06 VITALS — BP 142/64 | HR 88 | Temp 98.2°F | Wt 170.0 lb

## 2015-06-06 DIAGNOSIS — I10 Essential (primary) hypertension: Secondary | ICD-10-CM

## 2015-06-06 DIAGNOSIS — I2510 Atherosclerotic heart disease of native coronary artery without angina pectoris: Secondary | ICD-10-CM | POA: Diagnosis not present

## 2015-06-06 DIAGNOSIS — N184 Chronic kidney disease, stage 4 (severe): Secondary | ICD-10-CM

## 2015-06-06 DIAGNOSIS — J189 Pneumonia, unspecified organism: Secondary | ICD-10-CM | POA: Diagnosis not present

## 2015-06-06 DIAGNOSIS — M109 Gout, unspecified: Secondary | ICD-10-CM

## 2015-06-06 MED ORDER — COLCHICINE 0.6 MG PO TABS: 0.6000 mg | ORAL_TABLET | Freq: Every day | ORAL | Status: DC

## 2015-06-06 NOTE — Assessment & Plan Note (Signed)
Resolved clinically 

## 2015-06-06 NOTE — Assessment & Plan Note (Signed)
Monitor labs 

## 2015-06-06 NOTE — Progress Notes (Signed)
Subjective:  Patient ID: [name] [name], female    DOB: 06-Jul-1931  Age: 82 y.o. MRN: [id number]  CC: No chief complaint on file.   HPI AVERIANNA [name] presents for L foot pain x 3-4 d: gout vs insect bite to her L 2nd toe - better after Prednisone. She was treated for CAP 2 wks ago. F/u CAD, HTN, depression f/u. We need to review her meds  Outpatient Prescriptions Prior to Visit  Medication Sig Dispense Refill  . acetaminophen (TYLENOL) 650 MG CR tablet Take 325 mg by mouth every 8 (eight) hours as needed for pain (arthritis).     [name] albuterol (PROVENTIL HFA;VENTOLIN HFA) 108 (90 Base) MCG/ACT inhaler Inhale 1-2 puffs into the lungs every 6 (six) hours as needed for wheezing or shortness of breath. 1 Inhaler 1  . aspirin 325 MG EC tablet Take 325 mg by mouth daily.    [name] atorvastatin (LIPITOR) 40 MG tablet Take 1 tablet (40 mg total) by mouth daily. (Patient taking differently: Take 40 mg by mouth every other day. ) 90 tablet 3  . b complex vitamins capsule Take 1 capsule by mouth daily.    . Biotin 5000 MCG CAPS Take 1 capsule by mouth daily.    . Cholecalciferol (EQL VITAMIN D3) 1000 UNITS tablet Take 1 tablet (1,000 Units total) by mouth daily. 100 tablet 3  . darbepoetin (ARANESP) 100 MCG/0.5ML SOLN injection Inject 100 mcg into the skin every 8 (eight) weeks.    . diclofenac sodium (VOLTAREN) 1 % GEL Apply 4 g topically 4 (four) times daily. 1 Tube 3  . furosemide (LASIX) 20 MG tablet TAKE 1 TABLET BY MOUTH DAILY 30 tablet 5  . levothyroxine (SYNTHROID, LEVOTHROID) 50 MCG tablet TAKE 1 TABLET BY MOUTH EVERY DAY BEFORE BREAKFAST 90 tablet 3  . pantoprazole (PROTONIX) 40 MG tablet TAKE 1 TABLET BY MOUTH DAILY 90 tablet 3  . predniSONE (DELTASONE) 10 MG tablet Take 4 tablets ( total 40 mg) by mouth for 2 days; take 3 tablets ( total 30 mg) by mouth for 2 days; take 2 tablets ( total 20 mg) by mouth for 1 day; take 1 tablet ( total 10 mg) by mouth for 1 day. 17 tablet 0  .  sennosides-docusate sodium (SENOKOT-S) 8.6-50 MG tablet Take 1 tablet by mouth daily.     . febuxostat (ULORIC) 40 MG tablet Take 1 tablet (40 mg total) by mouth daily. (Patient not taking: Reported on 06/06/2015) 30 tablet 11  . metoprolol tartrate (LOPRESSOR) 25 MG tablet Take 0.5 tablets (12.5 mg total) by mouth 2 (two) times daily. (Patient not taking: Reported on 06/06/2015) 90 tablet 1  . cefdinir (OMNICEF) 300 MG capsule Take 1 capsule (300 mg total) by mouth 2 (two) times daily. (Patient not taking: Reported on 06/06/2015) 20 capsule 0  . pantoprazole (PROTONIX) 40 MG tablet Take 40 mg by mouth daily. Reported on 06/06/2015     No facility-administered medications prior to visit.    ROS Review of Systems  Constitutional: Negative for chills, activity change, appetite change, fatigue and unexpected weight change.  HENT: Negative for congestion, mouth sores and sinus pressure.   Eyes: Negative for visual disturbance.  Respiratory: Negative for cough and chest tightness.   Gastrointestinal: Negative for nausea and abdominal pain.  Genitourinary: Negative for frequency, difficulty urinating and vaginal pain.  Musculoskeletal: Positive for arthralgias and gait problem. Negative for back pain.  Skin: Negative for pallor and rash.  Neurological: Negative for dizziness,  tremors, weakness, numbness and headaches.  Psychiatric/Behavioral: Negative for confusion and sleep disturbance.    Objective:  BP 142/64 mmHg  Pulse 88  Temp(Src) 98.2 F (36.8 C) (Oral)  Wt 170 lb (77.111 kg)  SpO2 97%  BP Readings from Last 3 Encounters:  06/06/15 142/64  05/15/15 140/80  05/11/15 140/90    Wt Readings from Last 3 Encounters:  06/06/15 170 lb (77.111 kg)  05/15/15 170 lb (77.111 kg)  05/11/15 170 lb (77.111 kg)    Physical Exam  Constitutional: She appears well-developed. No distress.  HENT:  Head: Normocephalic.  Right Ear: External ear normal.  Left Ear: External ear normal.  Nose:  Nose normal.  Mouth/Throat: Oropharynx is clear and moist.  Eyes: Conjunctivae are normal. Pupils are equal, round, and reactive to light. Right eye exhibits no discharge. Left eye exhibits no discharge.  Neck: Normal range of motion. Neck supple. No JVD present. No tracheal deviation present. No thyromegaly present.  Cardiovascular: Normal rate, regular rhythm and normal heart sounds.   Pulmonary/Chest: No stridor. No respiratory distress. She has no wheezes.  Abdominal: Soft. Bowel sounds are normal. She exhibits no distension and no mass. There is no tenderness. There is no rebound and no guarding.  Musculoskeletal: She exhibits tenderness. She exhibits no edema.  Lymphadenopathy:    She has no cervical adenopathy.  Neurological: She displays normal reflexes. No cranial nerve deficit. She exhibits normal muscle tone. Coordination normal.  Skin: No rash noted. No erythema.  Psychiatric: She has a normal mood and affect. Her behavior is normal. Judgment and thought content normal.  L foot is tender w/a slight swlling  Lab Results  Component Value Date   WBC 4.7 05/04/2015   HGB 11.0* 05/04/2015   HCT 33.0* 05/04/2015   PLT 350 05/04/2015   GLUCOSE 90 11/23/2014   CHOL 92 04/15/2013   TRIG 62 04/15/2013   HDL 40 04/15/2013   LDLCALC 30 04/15/2013   ALT 14 11/23/2014   AST 22 11/23/2014   NA 141 11/23/2014   K 3.8 11/23/2014   CL 102 11/23/2014   CREATININE 1.89* 11/23/2014   BUN 35* 11/23/2014   CO2 31 11/23/2014   TSH 1.76 11/23/2014   INR 0.98 05/03/2014    Mm Screening Breast Tomo Bilateral  06/01/2015  CLINICAL DATA:  Screening. EXAM: 2D DIGITAL SCREENING BILATERAL MAMMOGRAM WITH CAD AND ADJUNCT TOMO COMPARISON:  Previous exam(s). ACR Breast Density Category b: There are scattered areas of fibroglandular density. FINDINGS: There are no findings suspicious for malignancy. Images were processed with CAD. IMPRESSION: No mammographic evidence of malignancy. A result letter of  this screening mammogram will be mailed directly to the patient. RECOMMENDATION: Screening mammogram in one year. (Code:SM-B-01Y) BI-RADS CATEGORY  1: Negative. Electronically Signed   By: Nolon Nations M.D.   On: 06/01/2015 16:42    Assessment & Plan:   There are no diagnoses linked to this encounter. I have discontinued Ms. Streng's cefdinir. I am also having her maintain her aspirin, sennosides-docusate sodium, acetaminophen, b complex vitamins, Biotin, darbepoetin, Cholecalciferol, atorvastatin, metoprolol tartrate, febuxostat, furosemide, albuterol, diclofenac sodium, predniSONE, pantoprazole, and levothyroxine.  No orders of the defined types were placed in this encounter.     Follow-up: No Follow-up on file.  [name], MD

## 2015-06-06 NOTE — Assessment & Plan Note (Signed)
Lopressor, Furosemide 

## 2015-06-06 NOTE — Assessment & Plan Note (Signed)
Colchicine qd - restart. D/c Uloric due to cost Predn prn

## 2015-06-06 NOTE — Progress Notes (Signed)
Pre visit review using our clinic review tool, if applicable. No additional management support is needed unless otherwise documented below in the visit note. 

## 2015-06-07 ENCOUNTER — Ambulatory Visit (INDEPENDENT_AMBULATORY_CARE_PROVIDER_SITE_OTHER): Payer: Medicare Other

## 2015-06-07 ENCOUNTER — Telehealth: Payer: Self-pay

## 2015-06-07 ENCOUNTER — Other Ambulatory Visit (INDEPENDENT_AMBULATORY_CARE_PROVIDER_SITE_OTHER): Payer: Medicare Other

## 2015-06-07 VITALS — BP 190/80 | HR 86 | Ht 63.0 in | Wt 168.0 lb

## 2015-06-07 DIAGNOSIS — I10 Essential (primary) hypertension: Secondary | ICD-10-CM | POA: Diagnosis not present

## 2015-06-07 DIAGNOSIS — I2510 Atherosclerotic heart disease of native coronary artery without angina pectoris: Secondary | ICD-10-CM

## 2015-06-07 DIAGNOSIS — Z0000 Encounter for general adult medical examination without abnormal findings: Secondary | ICD-10-CM

## 2015-06-07 DIAGNOSIS — I480 Paroxysmal atrial fibrillation: Secondary | ICD-10-CM

## 2015-06-07 DIAGNOSIS — I5020 Unspecified systolic (congestive) heart failure: Secondary | ICD-10-CM

## 2015-06-07 DIAGNOSIS — D509 Iron deficiency anemia, unspecified: Secondary | ICD-10-CM | POA: Diagnosis not present

## 2015-06-07 LAB — CBC WITH DIFFERENTIAL/PLATELET
BASOS ABS: 0 10*3/uL (ref 0.0–0.1)
BASOS PCT: 0.8 % (ref 0.0–3.0)
EOS ABS: 0.3 10*3/uL (ref 0.0–0.7)
Eosinophils Relative: 5.7 % — ABNORMAL HIGH (ref 0.0–5.0)
HEMATOCRIT: 30.4 % — AB (ref 36.0–46.0)
HEMOGLOBIN: 10.3 g/dL — AB (ref 12.0–15.0)
LYMPHS PCT: 29.1 % (ref 12.0–46.0)
Lymphs Abs: 1.6 10*3/uL (ref 0.7–4.0)
MCHC: 34 g/dL (ref 30.0–36.0)
MCV: 89.3 fl (ref 78.0–100.0)
MONO ABS: 0.6 10*3/uL (ref 0.1–1.0)
Monocytes Relative: 9.8 % (ref 3.0–12.0)
Neutro Abs: 3.1 10*3/uL (ref 1.4–7.7)
Neutrophils Relative %: 54.6 % (ref 43.0–77.0)
Platelets: 425 10*3/uL — ABNORMAL HIGH (ref 150.0–400.0)
RBC: 3.4 Mil/uL — AB (ref 3.87–5.11)
RDW: 14.1 % (ref 11.5–15.5)
WBC: 5.7 10*3/uL (ref 4.0–10.5)

## 2015-06-07 LAB — LIPID PANEL
CHOLESTEROL: 154 mg/dL (ref 0–200)
HDL: 47.6 mg/dL (ref >39.00)
LDL CALC: 87 mg/dL (ref 0–99)
NonHDL: 106.48
Total CHOL/HDL Ratio: 3
Triglycerides: 96 mg/dL (ref 0.0–149.0)
VLDL: 19.2 mg/dL (ref 0.0–40.0)

## 2015-06-07 LAB — BASIC METABOLIC PANEL
BUN: 34 mg/dL — AB (ref 6–23)
CHLORIDE: 105 meq/L (ref 96–112)
CO2: 27 mEq/L (ref 19–32)
CREATININE: 1.78 mg/dL — AB (ref 0.40–1.20)
Calcium: 9.5 mg/dL (ref 8.4–10.5)
GFR: 28.99 mL/min — AB (ref >60.00)
Glucose, Bld: 97 mg/dL (ref 70–99)
Potassium: 4 mEq/L (ref 3.5–5.1)
Sodium: 139 mEq/L (ref 135–145)

## 2015-06-07 LAB — IBC PANEL
Iron: 75 ug/dL (ref 42–145)
Saturation Ratios: 23 % (ref 20.0–50.0)
Transferrin: 233 mg/dL (ref 212.0–360.0)

## 2015-06-07 LAB — TSH: TSH: 0.6 u[IU]/mL (ref 0.35–4.50)

## 2015-06-07 LAB — HEPATIC FUNCTION PANEL
ALBUMIN: 3.9 g/dL (ref 3.5–5.2)
ALT: 14 U/L (ref 0–35)
AST: 22 U/L (ref 0–37)
Alkaline Phosphatase: 92 U/L (ref 39–117)
BILIRUBIN DIRECT: 0.1 mg/dL (ref 0.0–0.3)
BILIRUBIN TOTAL: 0.6 mg/dL (ref 0.2–1.2)
Total Protein: 7 g/dL (ref 6.0–8.3)

## 2015-06-07 NOTE — Telephone Encounter (Signed)
Patient in for AWV; Just FYI, BP was very elevated 190 90 but she had not taken her BP med. She will check it at home this week and call if it stays elevated.   Declined dexa due to cost post osteoporosis dx. Is not very clear regarding calcium intake in diet; Should she be on a supplement with Vit D? (in lieu of renal insufficiency)   Please advise. Tks

## 2015-06-07 NOTE — Progress Notes (Addendum)
Subjective:   [name] [name] is a 82 y.o. female who presents for Medicare Annual (Subsequent) preventive examination.  Review of Systems:  HRA assessment completed during visit; [name] [name], [name] [name]  The Patient was informed that this wellness visit is to identify risk and educate on how to reduce risk for increase disease through lifestyle changes.   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 Joya [name] tx; (also had osteoporosis) BY [name] [name] she had her first episode of gout/ feels her gout may be hereditary in the kidney; mother lost a kidney  Feels health is fair; very active   Psychosocial Lives alone 2 dtrs; both one is in [place]; the other is in [place] d[place] and great granddtr   Tobacco: never   ETOH 1-2 shots per week/ rum and coke sometimes at hs;  Medication review/ Adherent; Goes to the cancer center to take Aranesp/  GI; Medoff; Referred to [name]. hematology / oncology for anemia    BMI:29   Diet; eats breakfast; not a coffee drinker; [place] in the am/ likes cereal;  Lunch: goes out socialize Supper soup or other / frozen dinners; lean cuisine  Exercise;  HDL 40  Walk but not as much; walks with walker when up Stays active during the day; not an exercise person; but stays busy; Loves yard work and Heritage manager    HOME SAFETY; the children wanted her to sell the home and go to a retirement center post hospitalization; [name] opted to keep her home and states retirement center was  to costly Has a basement but lives on the 1st floor  There is nothing downstairs she has to get to.  Wears a lifeline; only good in her home No falls;  Removal of clutter clearing paths through the home,   Railing as needed discussed   Warehouse manager; YES Smoke detectors YES Firearms safety reviewed and will keep in a safe place if these exist. No guns Driving accidents; no;   Advised to use sun protection or large brim hat  Depression: Denies feeling depressed or hopeless; voices pleasure in daily life Was slightly depressed when she lost her job after MI  Goal; needs to socialize as she loves to talk; Recommended she get out every day if possible;   Cognitive; may forget some things;  Manages checkbook, medications; no failures of task Ad8 score reviewed for issues;  Issues making decisions; no  Less interest in hobbies / activities" no  Repeats questions, stories; family complaining: NO  Trouble using ordinary gadgets; microwave; computer: no  Forgets the month or year: no  Mismanaging finances: no  Missing apt: no but does write them down  Daily problems with thinking of memory NO Ad8 score is 0   Fall assessment no Gait assessment appears normal; walks without walker at home so she can hold on to furniture Uses walker when out as she feels safer; Still processing  hospitalization at [facility];    Mobilization and Functional losses from last year to this year; Is  using walker; still able to care for self and live independently    Urinary or fecal incontinence reviewed: no  Counseling Health Maintenance Colonoscopy; [name]. Earlean [name]; 8 years ago she thinks; no more recommended  EKG: 08/2014 Mammogram: 05/2015 neg Dexa/ dx with osteoporosis  report/ vit d 40 on 11/2014/  PAP: educated regarding the need for GYN exam;  Hearing: '2000hz'  both ears  Dentist x once a year;  Ophthalmology exam 06/2015 [name] [name] Had cataract removed   Immunizations Due: Tetanus;  States she has had a tetanus here at the office x 4 years ago  She feels certain but will discuss with [name]/ had chicken pox;  Pneumovax 03/1998 per the record  Advanced Directive; yes; will bring copy for chart  Health Recommendations and Referrals  Barriers to Success   Current Care Team reviewed and updated        Objective:     Vitals: BP 190/80 mmHg  Pulse 86  Ht '5\' 3"'  (1.6 m)  Wt 168 lb (76.204 kg)  BMI 29.77 kg/m2  SpO2 98%  Body mass index is 29.77 kg/(m^2).   Tobacco History  Smoking status  . Never Smoker   Smokeless tobacco  . Never Used     Counseling given: Yes   Past Medical History  Diagnosis Date  . Unspecified essential hypertension     [name] [name]  . Other and unspecified hyperlipidemia   . Unspecified disorder resulting from impaired renal function   . Diverticulosis of colon with hemorrhage   . Unspecified vitamin D deficiency   . Unspecified asthma(493.90)   . Persistent disorder of initiating or maintaining sleep   . Lumbago   . Esophageal reflux   . Diverticulosis of colon (without mention of hemorrhage)   . Depressive disorder, not elsewhere classified   . Anxiety state, unspecified   . Anemia, unspecified   . Unspecified osteomyelitis, site unspecified   . Myocardial infarction ([place])   . Thyroid disease    Past Surgical History  Procedure Laterality Date  . Bone marrow biopsy    . Status post right carotid enterectomy    . Partial colectomy  1995    diverticulosis of colon iwth hemorrhage.   . Coronary artery bypass graft  03/25/13    x4. CABS ON PUMP-Surgeon Jomarie Longs.   Family History  Problem Relation Age of Onset  . Hypertension Other   . COPD Mother   . Cancer Father     lymphoma   History  Sexual Activity  . Sexual Activity: Not on file    Outpatient Encounter Prescriptions as of 06/07/2015  Medication Sig  . acetaminophen (TYLENOL) 650 MG CR tablet Take 325 mg by mouth every 8 (eight) hours as needed  for pain (arthritis).   [name] albuterol (PROVENTIL HFA;VENTOLIN HFA) 108 (90 Base) MCG/ACT inhaler Inhale 1-2 puffs into the lungs every 6 (six) hours as needed for wheezing or shortness of breath.  [name] aspirin 325 MG EC tablet Take 325 mg by mouth daily.  [name] atorvastatin (LIPITOR) 40 MG tablet Take 1 tablet (40 mg total) by mouth daily. (Patient taking differently: Take 40 mg by mouth every other day. )  . b complex vitamins capsule Take 1 capsule by mouth daily.  . Biotin 5000 MCG CAPS Take 1 capsule by mouth daily.  . Cholecalciferol (EQL VITAMIN D3) 1000 UNITS tablet Take 1 tablet (1,000 Units total) by mouth daily.  . colchicine 0.6 MG tablet Take 1 tablet (0.6 mg total) by mouth  daily.  . darbepoetin (ARANESP) 100 MCG/0.5ML SOLN injection Inject 100 mcg into the skin every 8 (eight) weeks.  . furosemide (LASIX) 20 MG tablet TAKE 1 TABLET BY MOUTH DAILY  . levothyroxine (SYNTHROID, LEVOTHROID) 50 MCG tablet TAKE 1 TABLET BY MOUTH EVERY DAY BEFORE BREAKFAST  . metoprolol tartrate (LOPRESSOR) 25 MG tablet Take 0.5 tablets (12.5 mg total) by mouth 2 (two) times daily.  . pantoprazole (PROTONIX) 40 MG tablet TAKE 1 TABLET BY MOUTH DAILY  . sennosides-docusate sodium (SENOKOT-S) 8.6-50 MG tablet Take 1 tablet by mouth daily.   . diclofenac sodium (VOLTAREN) 1 % GEL Apply 4 g topically 4 (four) times daily. (Patient not taking: Reported on 06/07/2015)   No facility-administered encounter medications on file as of 06/07/2015.    Activities of Daily Living In your present state of health, do you have any difficulty performing the following activities: 06/07/2015  Hearing? N  Difficulty concentrating or making decisions? N    Patient Care Team: Cassandria Anger, MD as PCP - General Josue Hector, MD as Consulting Physician (Cardiology)    Assessment:     Exercise Activities and Dietary recommendations    Goals    . patient     Will review information on low purine foods;  Will also watch  sodium in diet        Fall Risk Fall Risk  06/07/2015 03/20/2015 12/19/2014 11/15/2013  Falls in the past year? No No No Yes  Number falls in past yr: - - - 1  Injury with Fall? - - - Yes  Risk Factor Category  - - - High Fall Risk   Uses walker when out to stay proactive in avoiding falls;  Stands straight with walker;   Depression Screen PHQ 2/9 Scores 06/07/2015 12/19/2014 10/18/2013 07/12/2013  PHQ - 2 Score 0 0 2 0  PHQ- 9 Score - - 6 -     Cognitive Testing No flowsheet data found.  No verbalized issues with memory; Ad8 score 0   Immunization History  Administered Date(s) Administered  . H1N1 12/28/2007  . Influenza Split 10/22/2011  . Influenza,inj,Quad PF,36+ Mos 10/01/2013, 09/14/2014  . Influenza-Unspecified 11/04/2012  . Pneumococcal Conjugate-13 12/19/2014  . Pneumococcal Polysaccharide-23 07/01/2012   Screening Tests Health Maintenance  Topic Date Due  . TETANUS/TDAP  04/09/1950  . ZOSTAVAX  04/09/1993  . DEXA SCAN  04/10/1998  . INFLUENZA VACCINE  08/22/2015  . PNA vac Low Risk Adult  Completed      Plan:   Ms Illescas was coming in for labs today and did not take her BP meds. BP was elevated x 2; will take her meds as soon as she gets home. States she has a BP cuff at home and will check her BP.   Also discussed Vit D and calcium; walks a lot for strength bearing exercise; Does not take Calcium as she states she cannot tolerate it. Given a list of foods with calcium to determine what her intake is; mentioned other calcium alternatives in gummies or viactiv; Will fup with [name]. Alain Marion for recommendation on taking ca in lieu of renal disease   During the course of the visit the patient was educated and counseled about the following appropriate screening and preventive services:   Vaccines to include Pneumoccal, Influenza, Hepatitis B, Td, Zostavax, HCV/ will discuss tetanus with [name]. Camila Li; states she has had one approx 4 years ago and thinks she had it  here;   Electrocardiogram-08/2014  Cardiovascular Disease/bp elevated;  Colorectal cancer screening- aged out  Bone density screening/ declined;   Diabetes screening/ neg  Glaucoma screening/06/2015  Mammography/05/2015 neg  Nutrition counseling adequate/ discussed decreasing sodium intake  Patient Instructions (the written plan) was given to the patient.   Wynetta Fines, RN  06/07/2015     Medical screening examination/treatment/procedure(s) were performed by non-physician practitioner and as supervising physician I was immediately available for consultation/collaboration. I agree with above. [name], MD

## 2015-06-07 NOTE — Patient Instructions (Addendum)
[name] [name] , Thank you for taking time to come for your Medicare Wellness Visit. I appreciate your ongoing commitment to your health goals. Please review the following plan we discussed and let me know if I can assist you in the future.   Educated to check with insurance regarding coverage of Shingles vaccination on Part D or Part B and may have lower co-pay if provided on the Part D side  Will be having eyes checked  Will discuss Tdap with Dr. Alain Marion     Go have lab work   Will review [employer]oporosis [employer] for more information Will defer dexa scan at this time  Will check BP at home this week and call if elevated    These are the goals we discussed: Goals    . patient     Will review information on low purine foods;  Will also watch sodium in diet         This is a list of the screening recommended for you and due dates:  Health Maintenance  Topic Date Due  . Tetanus Vaccine  04/09/1950  . Shingles Vaccine  04/09/1993  . DEXA scan (bone density measurement)  04/10/1998  . Flu Shot  08/22/2015  . Pneumonia vaccines  Completed   Osteoporosis Osteoporosis is the thinning and loss of density in the bones. Osteoporosis makes the bones more brittle, fragile, and likely to break (fracture). Over time, osteoporosis can cause the bones to become so weak that they fracture after a simple fall. The bones most likely to fracture are the bones in the hip, wrist, and spine. CAUSES  The exact cause is not known. RISK FACTORS Anyone can develop osteoporosis. You may be at greater risk if you have a family history of the condition or have poor nutrition. You may also have a higher risk if you are:   Female.   68 years old or older.  A smoker.  Not physically active.   White or Asian.  Slender. SIGNS AND SYMPTOMS  A fracture might be the first sign of the disease, especially if it results from a fall or injury that would not usually cause a bone to break. Other signs  and symptoms include:   Low back and neck pain.  Stooped posture.  Height loss. DIAGNOSIS  To make a diagnosis, your health care provider may:  Take a medical history.  Perform a physical exam.  Order tests, such as:  A bone mineral density test.  A dual-energy X-ray absorptiometry test. TREATMENT  The goal of osteoporosis treatment is to strengthen your bones to reduce your risk of a fracture. Treatment may involve:  Making lifestyle changes, such as:  Eating a diet rich in calcium.  Doing weight-bearing and muscle-strengthening exercises.  Stopping tobacco use.  Limiting alcohol intake.  Taking medicine to slow the process of bone loss or to increase bone density.  Monitoring your levels of calcium and vitamin D. HOME CARE INSTRUCTIONS  Include calcium and vitamin D in your diet. Calcium is important for bone health, and vitamin D helps the body absorb calcium.  Perform weight-bearing and muscle-strengthening exercises as directed by your health care provider.  Do not use any tobacco products, including cigarettes, chewing tobacco, and electronic cigarettes. If you need help quitting, ask your health care provider.  Limit your alcohol intake.  Take medicines only as directed by your health care provider.  Keep all follow-up visits as directed by your health care provider. This is important.  Take  precautions at home to lower your risk of falling, such as:  Keeping rooms well lit and clutter free.  Installing safety rails on stairs.  Using rubber mats in the bathroom and other areas that are often wet or slippery. SEEK IMMEDIATE MEDICAL CARE IF:  You fall or injure yourself.    This information is not intended to replace advice given to you by your health care provider. Make sure you discuss any questions you have with your health care provider.   Document Released: 10/17/2004 Document Revised: 01/28/2014 Document Reviewed: 06/17/2013 Elsevier  Interactive Patient Education 2016 [place] DASH stands for "Dietary Approaches to Stop Hypertension." The DASH eating plan is a healthy eating plan that has been shown to reduce high blood pressure (hypertension). Additional health benefits may include reducing the risk of type 2 diabetes mellitus, heart disease, and stroke. The DASH eating plan may also help with weight loss. WHAT DO I NEED TO KNOW ABOUT THE DASH EATING PLAN? For the DASH eating plan, you will follow these general guidelines:  Choose foods with a percent daily value for sodium of less than 5% (as listed on the food label).  Use salt-free seasonings or herbs instead of table salt or sea salt.  Check with your health care provider or pharmacist before using salt substitutes.  Eat lower-sodium products, often labeled as "lower sodium" or "no salt added."  Eat fresh foods.  Eat more vegetables, fruits, and low-fat dairy products.  Choose whole grains. Look for the word "whole" as the first word in the ingredient list.  Choose fish and skinless chicken or [place] more often than red meat. Limit fish, poultry, and meat to 6 oz (170 g) each day.  Limit sweets, desserts, sugars, and sugary drinks.  Choose heart-healthy fats.  Limit cheese to 1 oz (28 g) per day.  Eat more home-cooked food and less restaurant, buffet, and fast food.  Limit fried foods.  Cook foods using methods other than frying.  Limit canned vegetables. If you do use them, rinse them well to decrease the sodium.  When eating at a restaurant, ask that your food be prepared with less salt, or no salt if possible. WHAT FOODS CAN I EAT? Seek help from a dietitian for individual calorie needs. Grains Whole grain or whole wheat bread. Brown rice. Whole grain or whole wheat pasta. Quinoa, bulgur, and whole grain cereals. Low-sodium cereals. Corn or whole wheat flour tortillas. Whole grain cornbread. Whole grain crackers.  Low-sodium crackers. Vegetables Fresh or frozen vegetables (raw, steamed, roasted, or grilled). Low-sodium or reduced-sodium tomato and vegetable juices. Low-sodium or reduced-sodium tomato sauce and paste. Low-sodium or reduced-sodium canned vegetables.  Fruits All fresh, canned (in natural juice), or frozen fruits. Meat and Other Protein Products Ground beef (85% or leaner), grass-fed beef, or beef trimmed of fat. Skinless chicken or [place]. Ground chicken or [place]. Pork trimmed of fat. All fish and seafood. Eggs. Dried beans, peas, or lentils. Unsalted nuts and seeds. Unsalted canned beans. Dairy Low-fat dairy products, such as skim or 1% milk, 2% or reduced-fat cheeses, low-fat ricotta or cottage cheese, or plain low-fat yogurt. Low-sodium or reduced-sodium cheeses. Fats and Oils Tub margarines without trans fats. Light or reduced-fat mayonnaise and salad dressings (reduced sodium). Avocado. Safflower, olive, or canola oils. Natural peanut or almond butter. Other Unsalted popcorn and pretzels. The items listed above may not be a complete list of recommended foods or beverages. Contact your dietitian for more options. WHAT  FOODS ARE NOT RECOMMENDED? Grains White bread. White pasta. White rice. Refined cornbread. Bagels and croissants. Crackers that contain trans fat. Vegetables Creamed or fried vegetables. Vegetables in a cheese sauce. Regular canned vegetables. Regular canned tomato sauce and paste. Regular tomato and vegetable juices. Fruits Dried fruits. Canned fruit in light or heavy syrup. Fruit juice. Meat and Other Protein Products Fatty cuts of meat. Ribs, chicken wings, bacon, sausage, bologna, salami, chitterlings, fatback, hot dogs, bratwurst, and packaged luncheon meats. Salted nuts and seeds. Canned beans with salt. Dairy Whole or 2% milk, cream, half-and-half, and cream cheese. Whole-fat or sweetened yogurt. Full-fat cheeses or blue cheese. Nondairy creamers and whipped  toppings. Processed cheese, cheese spreads, or cheese curds. Condiments Onion and garlic salt, seasoned salt, table salt, and sea salt. Canned and packaged gravies. Worcestershire sauce. Tartar sauce. Barbecue sauce. Teriyaki sauce. Soy sauce, including reduced sodium. Steak sauce. Fish sauce. Oyster sauce. Cocktail sauce. Horseradish. Ketchup and mustard. Meat flavorings and tenderizers. Bouillon cubes. Hot sauce. Tabasco sauce. Marinades. Taco seasonings. Relishes. Fats and Oils Butter, stick margarine, lard, shortening, ghee, and bacon fat. Coconut, palm kernel, or palm oils. Regular salad dressings. Other Pickles and olives. Salted popcorn and pretzels. The items listed above may not be a complete list of foods and beverages to avoid. Contact your dietitian for more information. WHERE CAN I FIND MORE INFORMATION? National Heart, Lung, and Blood Institute: travelstabloid.com   This information is not intended to replace advice given to you by your health care provider. Make sure you discuss any questions you have with your health care provider.   Document Released: 12/27/2010 Document Revised: 01/28/2014 Document Reviewed: 11/11/2012 Elsevier Interactive Patient Education 2016 [place].   Bone Densitometry Bone densitometry is an imaging test that uses a special X-ray to measure the amount of calcium and other minerals in your bones (bone density). This test is also known as a bone mineral density test or dual-energy X-ray absorptiometry (DXA). The test can measure bone density at your hip and your spine. It is similar to having a regular X-ray. You may have this test to:  Diagnose a condition that causes weak or thin bones (osteoporosis).  Predict your risk of a broken bone (fracture).  Determine how well osteoporosis treatment is working. LET [facility] CARE PROVIDER KNOW ABOUT:  Any allergies you have.  All medicines you are taking, including  vitamins, herbs, eye drops, creams, and over-the-counter medicines.  Previous problems you or members of your family have had with the use of anesthetics.  Any blood disorders you have.  Previous surgeries you have had.  Medical conditions you have.  Possibility of pregnancy.  Any other medical test you had within the previous 14 days that used contrast material. RISKS AND COMPLICATIONS Generally, this is a safe procedure. However, problems can occur and may include the following:  This test exposes you to a very small amount of radiation.  The risks of radiation exposure may be greater to unborn children. BEFORE THE PROCEDURE  Do not take any calcium supplements for 24 hours before having the test. You can otherwise eat and drink what you usually do.  Take off all metal jewelry, eyeglasses, dental appliances, and any other metal objects. PROCEDURE  You may lie on an exam table. There will be an X-ray generator below you and an imaging device above you.  Other devices, such as boxes or braces, may be used to position your body properly for the scan.  You will need to  lie still while the machine slowly scans your body.  The images will show up on a computer monitor. AFTER THE PROCEDURE You may need more testing at a later time.   This information is not intended to replace advice given to you by your health care provider. Make sure you discuss any questions you have with your health care provider.   Document Released: 01/30/2004 Document Revised: 01/28/2014 Document Reviewed: 06/17/2013 Elsevier Interactive Patient Education 2016 [place] in the Home  Falls can cause injuries. They can happen to people of all ages. There are many things you can do to make your home safe and to help prevent falls.  WHAT CAN I DO ON THE OUTSIDE OF MY HOME?  Regularly fix the edges of walkways and driveways and fix any cracks.  Remove anything that might make you trip  as you walk through a door, such as a raised step or threshold.  Trim any bushes or trees on the path to your home.  Use bright outdoor lighting.  Clear any walking paths of anything that might make someone trip, such as rocks or tools.  Regularly check to see if handrails are loose or broken. Make sure that both sides of any steps have handrails.  Any raised decks and porches should have guardrails on the edges.  Have any leaves, snow, or ice cleared regularly.  Use sand or salt on walking paths during winter.  Clean up any spills in your garage right away. This includes oil or grease spills. WHAT CAN I DO IN THE BATHROOM?   Use night lights.  Install grab bars by the toilet and in the tub and shower. Do not use towel bars as grab bars.  Use non-skid mats or decals in the tub or shower.  If you need to sit down in the shower, use a plastic, non-slip stool.  Keep the floor dry. Clean up any water that spills on the floor as soon as it happens.  Remove soap buildup in the tub or shower regularly.  Attach bath mats securely with double-sided non-slip rug tape.  Do not have throw rugs and other things on the floor that can make you trip. WHAT CAN I DO IN THE BEDROOM?  Use night lights.  Make sure that you have a light by your bed that is easy to reach.  Do not use any sheets or blankets that are too big for your bed. They should not hang down onto the floor.  Have a firm chair that has side arms. You can use this for support while you get dressed.  Do not have throw rugs and other things on the floor that can make you trip. WHAT CAN I DO IN THE KITCHEN?  Clean up any spills right away.  Avoid walking on wet floors.  Keep items that you use a lot in easy-to-reach places.  If you need to reach something above you, use a strong step stool that has a grab bar.  Keep electrical cords out of the way.  Do not use floor polish or wax that makes floors slippery. If you  must use wax, use non-skid floor wax.  Do not have throw rugs and other things on the floor that can make you trip. WHAT CAN I DO WITH MY STAIRS?  Do not leave any items on the stairs.  Make sure that there are handrails on both sides of the stairs and use them. Fix handrails that are broken or  loose. Make sure that handrails are as long as the stairways.  Check any carpeting to make sure that it is firmly attached to the stairs. Fix any carpet that is loose or worn.  Avoid having throw rugs at the top or bottom of the stairs. If you do have throw rugs, attach them to the floor with carpet tape.  Make sure that you have a light switch at the top of the stairs and the bottom of the stairs. If you do not have them, ask someone to add them for you. WHAT ELSE CAN I DO TO HELP PREVENT FALLS?  Wear shoes that:  Do not have high heels.  Have rubber bottoms.  Are comfortable and fit you well.  Are closed at the toe. Do not wear sandals.  If you use a stepladder:  Make sure that it is fully opened. Do not climb a closed stepladder.  Make sure that both sides of the stepladder are locked into place.  Ask someone to hold it for you, if possible.  Clearly mark and make sure that you can see:  Any grab bars or handrails.  First and last steps.  Where the edge of each step is.  Use tools that help you move around (mobility aids) if they are needed. These include:  Canes.  Walkers.  Scooters.  Crutches.  Turn on the lights when you go into a dark area. Replace any light bulbs as soon as they burn out.  Set up your furniture so you have a clear path. Avoid moving your furniture around.  If any of your floors are uneven, fix them.  If there are any pets around you, be aware of where they are.  Review your medicines with your doctor. Some medicines can make you feel dizzy. This can increase your chance of falling. Ask your doctor what other things that you can do to help  prevent falls.   This information is not intended to replace advice given to you by your health care provider. Make sure you discuss any questions you have with your health care provider.   Document Released: 11/03/2008 Document Revised: 05/24/2014 Document Reviewed: 02/11/2014 Elsevier Interactive Patient Education 2016 [place] Maintenance, Female Adopting a healthy lifestyle and getting preventive care can go a long way to promote health and wellness. Talk with your health care provider about what schedule of regular examinations is right for you. This is a good chance for you to check in with your provider about disease prevention and staying healthy. In between checkups, there are plenty of things you can do on your own. Experts have done a lot of research about which lifestyle changes and preventive measures are most likely to keep you healthy. Ask your health care provider for more information. WEIGHT AND DIET  Eat a healthy diet  Be sure to include plenty of vegetables, fruits, low-fat dairy products, and lean protein.  Do not eat a lot of foods high in solid fats, added sugars, or salt.  Get regular exercise. This is one of the most important things you can do for your health.  Most adults should exercise for at least 150 minutes each week. The exercise should increase your heart rate and make you sweat (moderate-intensity exercise).  Most adults should also do strengthening exercises at least twice a week. This is in addition to the moderate-intensity exercise.  Maintain a healthy weight  Body mass index (BMI) is a measurement that can be used to identify  possible weight problems. It estimates body fat based on height and weight. Your health care provider can help determine your BMI and help you achieve or maintain a healthy weight.  For females 52 years of age and older:   A BMI below 18.5 is considered underweight.  A BMI of 18.5 to 24.9 is normal.  A BMI  of 25 to 29.9 is considered overweight.  A BMI of 30 and above is considered obese.  Watch levels of cholesterol and blood lipids  You should start having your blood tested for lipids and cholesterol at 80 years of age, then have this test every 5 years.  You may need to have your cholesterol levels checked more often if:  Your lipid or cholesterol levels are high.  You are older than 80 years of age.  You are at high risk for heart disease.  CANCER SCREENING   Lung Cancer  Lung cancer screening is recommended for adults 75-34 years old who are at high risk for lung cancer because of a history of smoking.  A yearly low-dose CT scan of the lungs is recommended for people who:  Currently smoke.  Have quit within the past 15 years.  Have at least a 30-pack-year history of smoking. A pack year is smoking an average of one pack of cigarettes a day for 1 year.  Yearly screening should continue until it has been 15 years since you quit.  Yearly screening should stop if you develop a health problem that would prevent you from having lung cancer treatment.  Breast Cancer  Practice breast self-awareness. This means understanding how your breasts normally appear and feel.  It also means doing regular breast self-exams. Let your health care provider know about any changes, no matter how small.  If you are in your 20s or 30s, you should have a clinical breast exam (CBE) by a health care provider every 1-3 years as part of a regular health exam.  If you are 39 or older, have a CBE every year. Also consider having a breast X-ray (mammogram) every year.  If you have a family history of breast cancer, talk to your health care provider about genetic screening.  If you are at high risk for breast cancer, talk to your health care provider about having an MRI and a mammogram every year.  Breast cancer gene (BRCA) assessment is recommended for women who have family members with  BRCA-related cancers. BRCA-related cancers include:  Breast.  Ovarian.  Tubal.  Peritoneal cancers.  Results of the assessment will determine the need for genetic counseling and BRCA1 and BRCA2 testing. Cervical Cancer Your health care provider may recommend that you be screened regularly for cancer of the pelvic organs (ovaries, uterus, and vagina). This screening involves a pelvic examination, including checking for microscopic changes to the surface of your cervix (Pap test). You may be encouraged to have this screening done every 3 years, beginning at age 54.  For women ages 57-65, health care providers may recommend pelvic exams and Pap testing every 3 years, or they may recommend the Pap and pelvic exam, combined with testing for human papilloma virus (HPV), every 5 years. Some types of HPV increase your risk of cervical cancer. Testing for HPV may also be done on women of any age with unclear Pap test results.  Other health care providers may not recommend any screening for nonpregnant women who are considered low risk for pelvic cancer and who do not have symptoms. Ask your  health care provider if a screening pelvic exam is right for you.  If you have had past treatment for cervical cancer or a condition that could lead to cancer, you need Pap tests and screening for cancer for at least 20 years after your treatment. If Pap tests have been discontinued, your risk factors (such as having a new sexual partner) need to be reassessed to determine if screening should resume. Some women have medical problems that increase the chance of getting cervical cancer. In these cases, your health care provider may recommend more frequent screening and Pap tests. Colorectal Cancer  This type of cancer can be detected and often prevented.  Routine colorectal cancer screening usually begins at 80 years of age and continues through 80 years of age.  Your health care provider may recommend screening at  an earlier age if you have risk factors for colon cancer.  Your health care provider may also recommend using home test kits to check for hidden blood in the stool.  A small camera at the end of a tube can be used to examine your colon directly (sigmoidoscopy or colonoscopy). This is done to check for the earliest forms of colorectal cancer.  Routine screening usually begins at age 62.  Direct examination of the colon should be repeated every 5-10 years through 80 years of age. However, you may need to be screened more often if early forms of precancerous polyps or small growths are found. Skin Cancer  Check your skin from head to toe regularly.  Tell your health care provider about any new moles or changes in moles, especially if there is a change in a mole's shape or color.  Also tell your health care provider if you have a mole that is larger than the size of a pencil eraser.  Always use sunscreen. Apply sunscreen liberally and repeatedly throughout the day.  Protect yourself by wearing long sleeves, pants, a wide-brimmed hat, and sunglasses whenever you are outside. HEART DISEASE, DIABETES, AND HIGH BLOOD PRESSURE   High blood pressure causes heart disease and increases the risk of stroke. High blood pressure is more likely to develop in:  People who have blood pressure in the high end of the normal range (130-139/85-89 mm Hg).  People who are overweight or obese.  People who are African American.  If you are 52-74 years of age, have your blood pressure checked every 3-5 years. If you are 2 years of age or older, have your blood pressure checked every year. You should have your blood pressure measured twice--once when you are at a hospital or clinic, and once when you are not at a hospital or clinic. Record the average of the two measurements. To check your blood pressure when you are not at a hospital or clinic, you can use:  An automated blood pressure machine at a  pharmacy.  A home blood pressure monitor.  If you are between 60 years and 1 years old, ask your health care provider if you should take aspirin to prevent strokes.  Have regular diabetes screenings. This involves taking a blood sample to check your fasting blood sugar level.  If you are at a normal weight and have a low risk for diabetes, have this test once every three years after 80 years of age.  If you are overweight and have a high risk for diabetes, consider being tested at a younger age or more often. PREVENTING INFECTION  Hepatitis B  If you have a higher  risk for hepatitis B, you should be screened for this virus. You are considered at high risk for hepatitis B if:  You were born in a country where hepatitis B is common. Ask your health care provider which countries are considered high risk.  Your parents were born in a high-risk country, and you have not been immunized against hepatitis B (hepatitis B vaccine).  You have HIV or AIDS.  You use needles to inject street drugs.  You live with someone who has hepatitis B.  You have had sex with someone who has hepatitis B.  You get hemodialysis treatment.  You take certain medicines for conditions, including cancer, organ transplantation, and autoimmune conditions. Hepatitis C  Blood testing is recommended for:  Everyone born from [birth year] through 1963.  Anyone with known risk factors for hepatitis C. Sexually transmitted infections (STIs)  You should be screened for sexually transmitted infections (STIs) including gonorrhea and chlamydia if:  You are sexually active and are younger than 80 years of age.  You are older than 80 years of age and your health care provider tells you that you are at risk for this type of infection.  Your sexual activity has changed since you were last screened and you are at an increased risk for chlamydia or gonorrhea. Ask your health care provider if you are at risk.  If you do not  have HIV, but are at risk, it may be recommended that you take a prescription medicine daily to prevent HIV infection. This is called pre-exposure prophylaxis (PrEP). You are considered at risk if:  You are sexually active and do not regularly use condoms or know the HIV status of your partner(s).  You take drugs by injection.  You are sexually active with a partner who has HIV. Talk with your health care provider about whether you are at high risk of being infected with HIV. If you choose to begin PrEP, you should first be tested for HIV. You should then be tested every 3 months for as long as you are taking PrEP.  PREGNANCY   If you are premenopausal and you may become pregnant, ask your health care provider about preconception counseling.  If you may become pregnant, take 400 to 800 micrograms (mcg) of folic acid every day.  If you want to prevent pregnancy, talk to your health care provider about birth control (contraception). OSTEOPOROSIS AND MENOPAUSE   Osteoporosis is a disease in which the bones lose minerals and strength with aging. This can result in serious bone fractures. Your risk for osteoporosis can be identified using a bone density scan.  If you are 7 years of age or older, or if you are at risk for osteoporosis and fractures, ask your health care provider if you should be screened.  Ask your health care provider whether you should take a calcium or vitamin D supplement to lower your risk for osteoporosis.  Menopause may have certain physical symptoms and risks.  Hormone replacement therapy may reduce some of these symptoms and risks. Talk to your health care provider about whether hormone replacement therapy is right for you.  HOME CARE INSTRUCTIONS   Schedule regular health, dental, and eye exams.  Stay current with your immunizations.   Do not use any tobacco products including cigarettes, chewing tobacco, or electronic cigarettes.  If you are pregnant, do not  drink alcohol.  If you are breastfeeding, limit how much and how often you drink alcohol.  Limit alcohol intake to no more  than 1 drink per day for nonpregnant women. One drink equals 12 ounces of beer, 5 ounces of wine, or 1 ounces of hard liquor.  Do not use street drugs.  Do not share needles.  Ask your health care provider for help if you need support or information about quitting drugs.  Tell your health care provider if you often feel depressed.  Tell your health care provider if you have ever been abused or do not feel safe at home.   This information is not intended to replace advice given to you by your health care provider. Make sure you discuss any questions you have with your health care provider.   Document Released: 07/23/2010 Document Revised: 01/28/2014 Document Reviewed: 12/09/2012 Elsevier Interactive Patient Education [employer].

## 2015-06-08 NOTE — Telephone Encounter (Signed)
Noted. Thx.

## 2015-06-09 ENCOUNTER — Ambulatory Visit: Payer: Self-pay

## 2015-06-16 ENCOUNTER — Ambulatory Visit (INDEPENDENT_AMBULATORY_CARE_PROVIDER_SITE_OTHER): Payer: Medicare Other | Admitting: Internal Medicine

## 2015-06-16 ENCOUNTER — Encounter: Payer: Self-pay | Admitting: Internal Medicine

## 2015-06-16 VITALS — BP 120/60 | HR 86 | Wt 168.0 lb

## 2015-06-16 DIAGNOSIS — I2510 Atherosclerotic heart disease of native coronary artery without angina pectoris: Secondary | ICD-10-CM

## 2015-06-16 DIAGNOSIS — M810 Age-related osteoporosis without current pathological fracture: Secondary | ICD-10-CM

## 2015-06-16 DIAGNOSIS — I480 Paroxysmal atrial fibrillation: Secondary | ICD-10-CM | POA: Diagnosis not present

## 2015-06-16 DIAGNOSIS — R6889 Other general symptoms and signs: Secondary | ICD-10-CM

## 2015-06-16 DIAGNOSIS — M109 Gout, unspecified: Secondary | ICD-10-CM | POA: Diagnosis not present

## 2015-06-16 DIAGNOSIS — Z7409 Other reduced mobility: Secondary | ICD-10-CM

## 2015-06-16 DIAGNOSIS — N184 Chronic kidney disease, stage 4 (severe): Secondary | ICD-10-CM

## 2015-06-16 DIAGNOSIS — D6489 Other specified anemias: Secondary | ICD-10-CM

## 2015-06-16 DIAGNOSIS — E559 Vitamin D deficiency, unspecified: Secondary | ICD-10-CM

## 2015-06-16 DIAGNOSIS — I10 Essential (primary) hypertension: Secondary | ICD-10-CM

## 2015-06-16 NOTE — Assessment & Plan Note (Signed)
Use a walker 

## 2015-06-16 NOTE — Assessment & Plan Note (Signed)
On Lipitor, ASA 

## 2015-06-16 NOTE — Assessment & Plan Note (Signed)
Vit D 

## 2015-06-16 NOTE — Assessment & Plan Note (Signed)
likely due to a renal insufficiency

## 2015-06-16 NOTE — Assessment & Plan Note (Signed)
On Vit D 

## 2015-06-16 NOTE — Progress Notes (Signed)
Pre visit review using our clinic review tool, if applicable. No additional management support is needed unless otherwise documented below in the visit note. 

## 2015-06-16 NOTE — Assessment & Plan Note (Signed)
On ASA, Toprol 

## 2015-06-16 NOTE — Assessment & Plan Note (Signed)
Doing well Colchicine qd Predn prn

## 2015-06-16 NOTE — Assessment & Plan Note (Signed)
Lopressor, Furosemide 

## 2015-06-16 NOTE — Progress Notes (Signed)
Subjective:  Patient ID: [name] [name], female    DOB: 07/29/1931  Age: 82 y.o. MRN: [id number]  CC: No chief complaint on file.   HPI YAN DOTTS presents for CAD, dyslipidemia, gout f/u. Doing well.  Outpatient Prescriptions Prior to Visit  Medication Sig Dispense Refill  . acetaminophen (TYLENOL) 650 MG CR tablet Take 325 mg by mouth every 8 (eight) hours as needed for pain (arthritis).     [name] albuterol (PROVENTIL HFA;VENTOLIN HFA) 108 (90 Base) MCG/ACT inhaler Inhale 1-2 puffs into the lungs every 6 (six) hours as needed for wheezing or shortness of breath. 1 Inhaler 1  . aspirin 325 MG EC tablet Take 325 mg by mouth daily.    [name] atorvastatin (LIPITOR) 40 MG tablet Take 1 tablet (40 mg total) by mouth daily. (Patient taking differently: Take 40 mg by mouth every other day. ) 90 tablet 3  . b complex vitamins capsule Take 1 capsule by mouth daily.    . Biotin 5000 MCG CAPS Take 1 capsule by mouth daily.    . Cholecalciferol (EQL VITAMIN D3) 1000 UNITS tablet Take 1 tablet (1,000 Units total) by mouth daily. 100 tablet 3  . colchicine 0.6 MG tablet Take 1 tablet (0.6 mg total) by mouth daily. 30 tablet 11  . darbepoetin (ARANESP) 100 MCG/0.5ML SOLN injection Inject 100 mcg into the skin every 8 (eight) weeks.    . furosemide (LASIX) 20 MG tablet TAKE 1 TABLET BY MOUTH DAILY 30 tablet 5  . levothyroxine (SYNTHROID, LEVOTHROID) 50 MCG tablet TAKE 1 TABLET BY MOUTH EVERY DAY BEFORE BREAKFAST 90 tablet 3  . metoprolol tartrate (LOPRESSOR) 25 MG tablet Take 0.5 tablets (12.5 mg total) by mouth 2 (two) times daily. 90 tablet 1  . pantoprazole (PROTONIX) 40 MG tablet TAKE 1 TABLET BY MOUTH DAILY 90 tablet 3  . sennosides-docusate sodium (SENOKOT-S) 8.6-50 MG tablet Take 1 tablet by mouth daily.     . diclofenac sodium (VOLTAREN) 1 % GEL Apply 4 g topically 4 (four) times daily. (Patient not taking: Reported on 06/16/2015) 1 Tube 3   No facility-administered medications prior to visit.     ROS Review of Systems  Constitutional: Positive for fatigue. Negative for chills, activity change, appetite change and unexpected weight change.  HENT: Negative for congestion, mouth sores and sinus pressure.   Eyes: Negative for visual disturbance.  Respiratory: Negative for cough and chest tightness.   Gastrointestinal: Negative for nausea and abdominal pain.  Genitourinary: Negative for frequency, difficulty urinating and vaginal pain.  Musculoskeletal: Negative for back pain and gait problem.  Skin: Negative for pallor and rash.  Neurological: Negative for dizziness, tremors, weakness, numbness and headaches.  Psychiatric/Behavioral: Negative for confusion and sleep disturbance. The patient is not nervous/anxious.     Objective:  BP 120/60 mmHg  Pulse 86  Wt 168 lb (76.204 kg)  SpO2 96%  BP Readings from Last 3 Encounters:  06/16/15 120/60  06/07/15 190/80  06/06/15 142/64    Wt Readings from Last 3 Encounters:  06/16/15 168 lb (76.204 kg)  06/07/15 168 lb (76.204 kg)  06/06/15 170 lb (77.111 kg)    Physical Exam  Constitutional: She appears well-developed. No distress.  HENT:  Head: Normocephalic.  Right Ear: External ear normal.  Left Ear: External ear normal.  Nose: Nose normal.  Mouth/Throat: Oropharynx is clear and moist.  Eyes: Conjunctivae are normal. Pupils are equal, round, and reactive to light. Right eye exhibits no discharge. Left eye exhibits no  discharge.  Neck: Normal range of motion. Neck supple. No JVD present. No tracheal deviation present. No thyromegaly present.  Cardiovascular: Normal rate, regular rhythm and normal heart sounds.   Pulmonary/Chest: No stridor. No respiratory distress. She has no wheezes.  Abdominal: Soft. Bowel sounds are normal. She exhibits no distension and no mass. There is no tenderness. There is no rebound and no guarding.  Musculoskeletal: She exhibits no edema or tenderness.  Lymphadenopathy:    She has no cervical  adenopathy.  Neurological: She displays normal reflexes. No cranial nerve deficit. She exhibits normal muscle tone. Coordination abnormal.  Skin: No rash noted. No erythema.  Psychiatric: She has a normal mood and affect. Her behavior is normal. Judgment and thought content normal.  using a walker   Lab Results  Component Value Date   WBC 5.7 06/07/2015   HGB 10.3* 06/07/2015   HCT 30.4* 06/07/2015   PLT 425.0* 06/07/2015   GLUCOSE 97 06/07/2015   CHOL 154 06/07/2015   TRIG 96.0 06/07/2015   HDL 47.60 06/07/2015   LDLCALC 87 06/07/2015   ALT 14 06/07/2015   AST 22 06/07/2015   NA 139 06/07/2015   K 4.0 06/07/2015   CL 105 06/07/2015   CREATININE 1.78* 06/07/2015   BUN 34* 06/07/2015   CO2 27 06/07/2015   TSH 0.60 06/07/2015   INR 0.98 05/03/2014    Mm Screening Breast Tomo Bilateral  06/01/2015  CLINICAL DATA:  Screening. EXAM: 2D DIGITAL SCREENING BILATERAL MAMMOGRAM WITH CAD AND ADJUNCT TOMO COMPARISON:  Previous exam(s). ACR Breast Density Category b: There are scattered areas of fibroglandular density. FINDINGS: There are no findings suspicious for malignancy. Images were processed with CAD. IMPRESSION: No mammographic evidence of malignancy. A result letter of this screening mammogram will be mailed directly to the patient. RECOMMENDATION: Screening mammogram in one year. (Code:SM-B-01Y) BI-RADS CATEGORY  1: Negative. Electronically Signed   By: Nolon Nations M.D.   On: 06/01/2015 16:42    Assessment & Plan:   There are no diagnoses linked to this encounter. I am having Ms. Rudnicki maintain her aspirin, sennosides-docusate sodium, acetaminophen, b complex vitamins, Biotin, darbepoetin, Cholecalciferol, atorvastatin, metoprolol tartrate, furosemide, albuterol, diclofenac sodium, pantoprazole, levothyroxine, and colchicine.  No orders of the defined types were placed in this encounter.     Follow-up: No Follow-up on file.  [name], MD

## 2015-06-16 NOTE — Assessment & Plan Note (Signed)
2017 Creatininne is better

## 2015-06-29 ENCOUNTER — Other Ambulatory Visit: Payer: Self-pay | Admitting: Hematology

## 2015-06-30 ENCOUNTER — Telehealth: Payer: Self-pay | Admitting: Hematology

## 2015-06-30 NOTE — Telephone Encounter (Signed)
cld & spoke to pt and gave pt time & date for r/s appt for 6/12-per Dr Burr Medico to move pt to [facility] 6/12

## 2015-07-03 ENCOUNTER — Other Ambulatory Visit (HOSPITAL_BASED_OUTPATIENT_CLINIC_OR_DEPARTMENT_OTHER): Payer: Medicare Other

## 2015-07-03 ENCOUNTER — Telehealth: Payer: Self-pay | Admitting: Hematology

## 2015-07-03 ENCOUNTER — Ambulatory Visit: Payer: Self-pay

## 2015-07-03 ENCOUNTER — Encounter: Payer: Self-pay | Admitting: Oncology

## 2015-07-03 ENCOUNTER — Other Ambulatory Visit: Payer: Self-pay

## 2015-07-03 ENCOUNTER — Ambulatory Visit: Payer: Self-pay | Admitting: Oncology

## 2015-07-03 ENCOUNTER — Ambulatory Visit (HOSPITAL_BASED_OUTPATIENT_CLINIC_OR_DEPARTMENT_OTHER): Payer: Medicare Other | Admitting: Oncology

## 2015-07-03 ENCOUNTER — Ambulatory Visit (HOSPITAL_BASED_OUTPATIENT_CLINIC_OR_DEPARTMENT_OTHER): Payer: Medicare Other

## 2015-07-03 VITALS — BP 117/52 | HR 75 | Temp 98.2°F | Resp 18 | Ht 63.0 in | Wt 168.1 lb

## 2015-07-03 DIAGNOSIS — D631 Anemia in chronic kidney disease: Secondary | ICD-10-CM

## 2015-07-03 DIAGNOSIS — I10 Essential (primary) hypertension: Secondary | ICD-10-CM

## 2015-07-03 DIAGNOSIS — N189 Chronic kidney disease, unspecified: Principal | ICD-10-CM

## 2015-07-03 DIAGNOSIS — N184 Chronic kidney disease, stage 4 (severe): Secondary | ICD-10-CM

## 2015-07-03 DIAGNOSIS — Z961 Presence of intraocular lens: Secondary | ICD-10-CM | POA: Diagnosis not present

## 2015-07-03 LAB — CBC WITH DIFFERENTIAL/PLATELET
BASO%: 0.9 % (ref 0.0–2.0)
BASOS ABS: 0.1 10*3/uL (ref 0.0–0.1)
EOS%: 4.9 % (ref 0.0–7.0)
Eosinophils Absolute: 0.4 10*3/uL (ref 0.0–0.5)
HEMATOCRIT: 30.6 % — AB (ref 34.8–46.6)
HEMOGLOBIN: 10.3 g/dL — AB (ref 11.6–15.9)
LYMPH#: 1.9 10*3/uL (ref 0.9–3.3)
LYMPH%: 25.8 % (ref 14.0–49.7)
MCH: 29.9 pg (ref 25.1–34.0)
MCHC: 33.5 g/dL (ref 31.5–36.0)
MCV: 89.1 fL (ref 79.5–101.0)
MONO#: 0.7 10*3/uL (ref 0.1–0.9)
MONO%: 9.3 % (ref 0.0–14.0)
NEUT#: 4.5 10*3/uL (ref 1.5–6.5)
NEUT%: 59.1 % (ref 38.4–76.8)
PLATELETS: 350 10*3/uL (ref 145–400)
RBC: 3.44 10*6/uL — ABNORMAL LOW (ref 3.70–5.45)
RDW: 13.7 % (ref 11.2–14.5)
WBC: 7.6 10*3/uL (ref 3.9–10.3)

## 2015-07-03 MED ORDER — DARBEPOETIN ALFA-POLYSORBATE 500 MCG/ML IJ SOLN
200.0000 ug | Freq: Once | INTRAMUSCULAR | Status: DC
  Administered 2015-07-03: 200 ug via SUBCUTANEOUS

## 2015-07-03 MED ORDER — DARBEPOETIN ALFA 200 MCG/0.4ML IJ SOSY
200.0000 ug | PREFILLED_SYRINGE | Freq: Once | INTRAMUSCULAR | Status: DC
  Filled 2015-07-03: qty 0.4

## 2015-07-03 NOTE — Progress Notes (Signed)
[facility] PROGRESS NOTE DATE OF VISIT: 07/03/2015   [name] Kehr, MD [place] [address]  DIAGNOSIS: Anemia in chronic renal disease  CKD (chronic kidney disease) stage 4, GFR 15-29 ml/min [facility])  Chief Complaint  Patient presents with  . Follow-up    CURRENT THERAPY: Aranesp 200 mcg subcutaneous every 2 months for hemoglobin less than 11.   INTERVAL HISTORY: [name] [name] 82 y.o. female with a history of anemia secondary to renal insufficiency is here for follow-up. She was last seen in our office 6 months ago. She has been getting Aranesp injection every 2 months.  Still has mild fatigue, but able to tolerate daily activity without much difficulty. She lives alone, does use a [name] when she goes out, but is totally independent.   MEDICAL HISTORY: Past Medical History  Diagnosis Date  . Unspecified essential hypertension     Dr [name]  . Other and unspecified hyperlipidemia   . Unspecified disorder resulting from impaired renal function   . Diverticulosis of colon with hemorrhage   . Unspecified vitamin D deficiency   . Unspecified asthma(493.90)   . Persistent disorder of initiating or maintaining sleep   . Lumbago   . Esophageal reflux   . Diverticulosis of colon (without mention of hemorrhage)   . Depressive disorder, not elsewhere classified   . Anxiety state, unspecified   . Anemia, unspecified   . Unspecified osteomyelitis, site unspecified   . Myocardial infarction ([place])   . Thyroid disease    INTERIM HISTORY: has Vitamin D deficiency; Dyslipidemia; DEHYDRATION (VOLUME DEPLETION); Anxiety state; INSOMNIA, PERSISTENT; Essential hypertension; Coronary atherosclerosis; Bilateral carotid bruits; PERIPHERAL VASCULAR DISEASE; BRONCHITIS NOT SPECIFIED AS ACUTE OR CHRONIC; Asthma; GERD; DIVERTICULOSIS, COLON; Diverticulosis of colon with hemorrhage; PANCREATITIS; Disorder resulting from impaired renal function;  PYELONEPHRITIS; UNSPECIFIED DISORDER OF URETHRA&URINARY TRACT; LOW BACK PAIN; OSTEOMYELITIS; SYNCOPE; Fatigue; Anemia in chronic renal disease; Cramps, extremity; Nausea alone; Unspecified constipation; Unspecified hypothyroidism; Cough; A-fib ([place]); CHF (congestive heart failure) ([place]); PNA (pneumonia); Thyroid nodule; Influenza due to influenza A virus; Minimal cognitive impairment; Neuropathy ([place]); Acute non-ST segment elevation myocardial infarction [facility]); Disuse syndrome; Restless leg; Left leg weakness; Fall against object; Left ankle pain; Primary localized osteoarthrosis, lower leg; Alopecia; Pes anserine bursitis; Situational depression; Decreased ambulation status; Left-sided low back pain with sciatica; CKD (chronic kidney disease) stage 4, GFR 15-29 ml/min ([place]); Back pain; Compression fracture of T12 vertebra with delayed healing; Poor tolerance for ambulation; Osteoporosis; Piriformis syndrome of left side; Anemia; and Gout on her problem list.    ALLERGIES:  is allergic to codeine sulfate; darvon; propoxyphene napsylate; sulfacetamide sodium-sulfur; sulfamethoxazole-trimethoprim; and zolpidem.  MEDICATIONS: has a current medication list which includes the following prescription(s): acetaminophen, albuterol, aspirin, atorvastatin, b complex vitamins, biotin, cholecalciferol, colchicine, darbepoetin, diclofenac sodium, furosemide, levothyroxine, metoprolol tartrate, pantoprazole, and sennosides-docusate sodium, and the following Facility-Administered Medications: darbepoetin alfa.  SURGICAL HISTORY:  Past Surgical History  Procedure Laterality Date  . Bone marrow biopsy    . Status post right carotid enterectomy    . Partial colectomy  1995    diverticulosis of colon iwth hemorrhage.   . Coronary artery bypass graft  03/25/13    x4. CABS ON PUMP-Surgeon Jomarie Longs.    REVIEW OF SYSTEMS:   Constitutional: Denies fevers, chills; weight loss  Eyes: Denies blurriness of vision Ears, nose,  mouth, throat, and face: Denies mucositis or sore throat Respiratory: Denies cough, dyspnea or wheezes Cardiovascular: Denies palpitation, chest discomfort or  lower extremity swelling Gastrointestinal:  Denies nausea, heartburn or change in bowel habits Skin: Denies abnormal skin rashes Lymphatics: Denies new lymphadenopathy or easy bruising Neurological:Denies numbness, tingling or new weaknesses Behavioral/Psych: Mood is stable, no new changes  All other systems were reviewed with the patient and are negative.  PHYSICAL EXAMINATION: ECOG PERFORMANCE STATUS: 1  Blood pressure 117/52, pulse 75, temperature 98.2 F (36.8 C), temperature source Oral, resp. rate 18, height _0  (1.6 m), weight 168 lb 1.6 oz (76.25 kg), SpO2 98 %.   GENERAL:alert, no distress and comfortable; elderly female who is well developed, well nourished.  SKIN: skin color, texture, turgor are normal, no rashes or significant lesions; scar on chest well healed.  EYES: normal, Conjunctiva are pink and non-injected, sclera clear OROPHARYNX:no exudate, no erythema and lips, buccal mucosa, and tongue normal  NECK: supple, thyroid normal size, non-tender, without nodularity LYMPH:  no palpable lymphadenopathy in the cervical, axillary or supraclavicular LUNGS: clear to auscultation and percussion with normal breathing effort HEART: regular rate & rhythm and no murmurs and no lower extremity edema ABDOMEN:abdomen soft, non-tender and normal bowel sounds Musculoskeletal:no cyanosis of digits and no clubbing  NEURO: alert & oriented x 3 with fluent speech, no focal motor/sensory deficits  LABORATORY DATA: CBC Latest Ref Rng 07/03/2015 06/07/2015 05/04/2015  WBC 3.9 - 10.3 10e3/uL 7.6 5.7 4.7  Hemoglobin 11.6 - 15.9 g/dL 10.3(L) 10.3(L) 11.0(L)  Hematocrit 34.8 - 46.6 % 30.6(L) 30.4(L) 33.0(L)  Platelets 145 - 400 10e3/uL 350 425.0(H) 350    CMP Latest Ref Rng 06/07/2015 11/23/2014 05/05/2014  Glucose 70 - 99 mg/dL 97 90 94   BUN 6 - 23 mg/dL 34(H) 35(H) 32(H)  Creatinine 0.40 - 1.20 mg/dL 1.78(H) 1.89(H) 2.38(H)  Sodium 135 - 145 mEq/L 139 141 137  Potassium 3.5 - 5.1 mEq/L 4.0 3.8 4.4  Chloride 96 - 112 mEq/L 105 102 100  CO2 19 - 32 mEq/L _1 Calcium 8.4 - 10.5 mg/dL 9.5 9.4 9.1  Total Protein 6.0 - 8.3 g/dL 7.0 7.0 -  Total Bilirubin 0.2 - 1.2 mg/dL 0.6 0.6 -  Alkaline Phos 39 - 117 U/L 92 105 -  AST 0 - 37 U/L 22 22 -  ALT 0 - 35 U/L 14 14 -    Results for MAKAYIA, DUPLESSIS (MRN [id number]) as of 12/29/2014 23:03  Ref. Range 12/29/2014 10:36  Iron Latest Ref Range: 41-142 ug/dL 77  UIBC Latest Ref Range: 120-384 ug/dL 180  TIBC Latest Ref Range: 236-444 ug/dL 258  %SAT Latest Ref Range: 21-57 % 30  Ferritin Latest Ref Range: 9-269 ng/ml 159    Studies:  No results found.   RADIOGRAPHIC STUDY No new study    ASSESSMENT: [name] [name] 82 y.o. female with a history of Anemia in chronic renal disease  CKD (chronic kidney disease) stage 4, GFR 15-29 ml/min ([facility])   PLAN:  1. Anemia in chronic renal disease.   -She is clinically doing well, her hemoglobin today is 10.3 today. She has been able to maintain her hemoglobin well with Aranesp every 2 months. -We again reviewed the benefit and the risks of Aranesp injection, especially the risk of thrombosis, including heart attack and stroke. She voiced good understanding.   2. Chronic kidney disease.  --Avoid nephrotoxins and counseled on continued adequate hydration.  Her creatinine was 1.78 in May 2017 today and stable.    3. Gout  -I encouraged her to follow-up with her primary care physician, and  discussed if she would benefit from allopurinol to prevent gout flare  4. CAD, CHF, HTN, ARTHRITIS  -She will continue follow-up with her primary care physician   Follow-up.   --RTC in 6 months with repeat labs. She will continue Aranesp every 2 months  Patient had many questions, especially about her gout today. All questions were answered.  The patient knows to call the clinic with any problems, questions or concerns. We can certainly see the patient much sooner if necessary.  I spent 15 minutes counseling the patient face to face. The total time spent in the appointment was 20 minutes.   Mikey Bussing  07/03/2015

## 2015-07-03 NOTE — Telephone Encounter (Signed)
Gave pt apt & avs °

## 2015-07-03 NOTE — Patient Instructions (Signed)
Darbepoetin Alfa injection What is this medicine? DARBEPOETIN ALFA (dar be POE e tin AL fa) helps your body make more red blood cells. It is used to treat anemia caused by chronic kidney failure and chemotherapy. This medicine may be used for other purposes; ask your health care provider or pharmacist if you have questions. What should I tell my health care provider before I take this medicine? They need to know if you have any of these conditions: -blood clotting disorders or history of blood clots -cancer patient not on chemotherapy -cystic fibrosis -heart disease, such as angina, heart failure, or a history of a heart attack -hemoglobin level of 12 g/dL or greater -high blood pressure -low levels of folate, iron, or vitamin B12 -seizures -an unusual or allergic reaction to darbepoetin, erythropoietin, albumin, hamster proteins, latex, other medicines, foods, dyes, or preservatives -pregnant or trying to get pregnant -breast-feeding How should I use this medicine? This medicine is for injection into a vein or under the skin. It is usually given by a health care professional in a hospital or clinic setting. If you get this medicine at home, you will be taught how to prepare and give this medicine. Do not shake the solution before you withdraw a dose. Use exactly as directed. Take your medicine at regular intervals. Do not take your medicine more often than directed. It is important that you put your used needles and syringes in a special sharps container. Do not put them in a trash can. If you do not have a sharps container, call your pharmacist or healthcare provider to get one. Talk to your pediatrician regarding the use of this medicine in children. While this medicine may be used in children as young as 1 year for selected conditions, precautions do apply. Overdosage: If you think you have taken too much of this medicine contact a poison control center or emergency room at once. NOTE:  This medicine is only for you. Do not share this medicine with others. What if I miss a dose? If you miss a dose, take it as soon as you can. If it is almost time for your next dose, take only that dose. Do not take double or extra doses. What may interact with this medicine? Do not take this medicine with any of the following medications: -epoetin alfa This list may not describe all possible interactions. Give your health care provider a list of all the medicines, herbs, non-prescription drugs, or dietary supplements you use. Also tell them if you smoke, drink alcohol, or use illegal drugs. Some items may interact with your medicine. What should I watch for while using this medicine? Visit your prescriber or health care professional for regular checks on your progress and for the needed blood tests and blood pressure measurements. It is especially important for the doctor to make sure your hemoglobin level is in the desired range, to limit the risk of potential side effects and to give you the best benefit. Keep all appointments for any recommended tests. Check your blood pressure as directed. Ask your doctor what your blood pressure should be and when you should contact him or her. As your body makes more red blood cells, you may need to take iron, folic acid, or vitamin B supplements. Ask your doctor or health care provider which products are right for you. If you have kidney disease continue dietary restrictions, even though this medication can make you feel better. Talk with your doctor or health care professional about the   foods you eat and the vitamins that you take. What side effects may I notice from receiving this medicine? Side effects that you should report to your doctor or health care professional as soon as possible: -allergic reactions like skin rash, itching or hives, swelling of the face, lips, or tongue -breathing problems -changes in vision -chest pain -confusion, trouble speaking  or understanding -feeling faint or lightheaded, falls -high blood pressure -muscle aches or pains -pain, swelling, warmth in the leg -rapid weight gain -severe headaches -sudden numbness or weakness of the face, arm or leg -trouble walking, dizziness, loss of balance or coordination -seizures (convulsions) -swelling of the ankles, feet, hands -unusually weak or tired Side effects that usually do not require medical attention (report to your doctor or health care professional if they continue or are bothersome): -diarrhea -fever, chills (flu-like symptoms) -headaches -nausea, vomiting -redness, stinging, or swelling at site where injected This list may not describe all possible side effects. Call your doctor for medical advice about side effects. You may report side effects to FDA at 1-800-FDA-1088. Where should I keep my medicine? Keep out of the reach of children. Store in a refrigerator between 2 and 8 degrees C (36 and 46 degrees F). Do not freeze. Do not shake. Throw away any unused portion if using a single-dose vial. Throw away any unused medicine after the expiration date. NOTE: This sheet is a summary. It may not cover all possible information. If you have questions about this medicine, talk to your doctor, pharmacist, or health care provider.    2016, Elsevier/[employer]. (2007-12-22 10:23:57)  

## 2015-07-04 ENCOUNTER — Other Ambulatory Visit: Payer: Self-pay

## 2015-07-04 ENCOUNTER — Ambulatory Visit: Payer: Self-pay

## 2015-07-04 ENCOUNTER — Ambulatory Visit: Payer: Self-pay | Admitting: Hematology

## 2015-07-14 ENCOUNTER — Telehealth: Payer: Self-pay | Admitting: *Deleted

## 2015-07-14 NOTE — Telephone Encounter (Signed)
Rec'd fax pt is requesting refill on Lorazepam 0.5mg  take 1 tablet by mouth every 6 hours as needed. # 30 last filled 11/04/14. Is this ok to refill...[name] [name]

## 2015-07-14 NOTE — Telephone Encounter (Signed)
Only one fax has came through today, no electronic scripts was sent. Rec'd fax today at 12:47pm.../lmb

## 2015-07-14 NOTE — Telephone Encounter (Signed)
Patient is requesting refill ASAP.   Patient states she has been trying to get filled since Tuesday between our office and her pharmacy.  Patient is also requesting call back from [place].  States her Pharmacy states our office is the worse to respond on calls and refills.

## 2015-07-15 NOTE — Telephone Encounter (Signed)
OK to fill this prescription with additional refills x2 Thank you!  

## 2015-07-17 MED ORDER — LORAZEPAM 0.5 MG PO TABS: 0.5000 mg | ORAL_TABLET | Freq: Four times a day (QID) | ORAL | Status: DC | PRN

## 2015-07-17 NOTE — Telephone Encounter (Signed)
Rf phoned in to pt's pharmacy. See meds. Pt informed

## 2015-08-10 ENCOUNTER — Ambulatory Visit (INDEPENDENT_AMBULATORY_CARE_PROVIDER_SITE_OTHER): Payer: Medicare Other | Admitting: Internal Medicine

## 2015-08-10 ENCOUNTER — Encounter: Payer: Self-pay | Admitting: Internal Medicine

## 2015-08-10 VITALS — BP 120/60 | HR 82 | Wt 166.0 lb

## 2015-08-10 DIAGNOSIS — F4321 Adjustment disorder with depressed mood: Secondary | ICD-10-CM | POA: Diagnosis not present

## 2015-08-10 DIAGNOSIS — M542 Cervicalgia: Secondary | ICD-10-CM

## 2015-08-10 DIAGNOSIS — F411 Generalized anxiety disorder: Secondary | ICD-10-CM | POA: Diagnosis not present

## 2015-08-10 DIAGNOSIS — I10 Essential (primary) hypertension: Secondary | ICD-10-CM

## 2015-08-10 DIAGNOSIS — G4700 Insomnia, unspecified: Secondary | ICD-10-CM

## 2015-08-10 DIAGNOSIS — I2510 Atherosclerotic heart disease of native coronary artery without angina pectoris: Secondary | ICD-10-CM

## 2015-08-10 DIAGNOSIS — E785 Hyperlipidemia, unspecified: Secondary | ICD-10-CM | POA: Diagnosis not present

## 2015-08-10 MED ORDER — ESCITALOPRAM OXALATE 5 MG PO TABS: 5.0000 mg | ORAL_TABLET | Freq: Every day | ORAL | Status: DC

## 2015-08-10 NOTE — Assessment & Plan Note (Signed)
MSK - startling a lot and then feeling hurt and tense for a while in the neck area.  Start Lexapro

## 2015-08-10 NOTE — Assessment & Plan Note (Signed)
Start Lexapro

## 2015-08-10 NOTE — Assessment & Plan Note (Signed)
Hold Lipitor x 2 weeks 

## 2015-08-10 NOTE — Progress Notes (Signed)
Pre visit review using our clinic review tool, if applicable. No additional management support is needed unless otherwise documented below in the visit note. 

## 2015-08-10 NOTE — Patient Instructions (Signed)
Hold Atorvastatin x 2 weeks, then re-start

## 2015-08-10 NOTE — Assessment & Plan Note (Signed)
Lopressor, Furosemide 
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
## 2015-08-10 NOTE — Assessment & Plan Note (Addendum)
Chronic Lorazepam prn; Lexapro  Potential benefits of a long term benzodiazepines  use as well as potential risks  and complications were explained to the patient and were aknowledged.

## 2015-08-28 ENCOUNTER — Ambulatory Visit (HOSPITAL_BASED_OUTPATIENT_CLINIC_OR_DEPARTMENT_OTHER): Payer: Medicare Other

## 2015-08-28 ENCOUNTER — Other Ambulatory Visit (HOSPITAL_BASED_OUTPATIENT_CLINIC_OR_DEPARTMENT_OTHER): Payer: Medicare Other

## 2015-08-28 VITALS — BP 150/60 | HR 68 | Temp 98.0°F | Resp 18

## 2015-08-28 DIAGNOSIS — N184 Chronic kidney disease, stage 4 (severe): Secondary | ICD-10-CM

## 2015-08-28 DIAGNOSIS — D631 Anemia in chronic kidney disease: Secondary | ICD-10-CM

## 2015-08-28 DIAGNOSIS — N189 Chronic kidney disease, unspecified: Principal | ICD-10-CM

## 2015-08-28 LAB — CBC WITH DIFFERENTIAL/PLATELET
BASO%: 0.9 % (ref 0.0–2.0)
BASOS ABS: 0.1 10*3/uL (ref 0.0–0.1)
EOS ABS: 0.2 10*3/uL (ref 0.0–0.5)
EOS%: 3.3 % (ref 0.0–7.0)
HCT: 31.1 % — ABNORMAL LOW (ref 34.8–46.6)
HGB: 10.5 g/dL — ABNORMAL LOW (ref 11.6–15.9)
LYMPH%: 22.2 % (ref 14.0–49.7)
MCH: 30.9 pg (ref 25.1–34.0)
MCHC: 33.9 g/dL (ref 31.5–36.0)
MCV: 91.2 fL (ref 79.5–101.0)
MONO#: 0.5 10*3/uL (ref 0.1–0.9)
MONO%: 7.7 % (ref 0.0–14.0)
NEUT#: 4.4 10*3/uL (ref 1.5–6.5)
NEUT%: 65.9 % (ref 38.4–76.8)
Platelets: 360 10*3/uL (ref 145–400)
RBC: 3.41 10*6/uL — AB (ref 3.70–5.45)
RDW: 13.1 % (ref 11.2–14.5)
WBC: 6.7 10*3/uL (ref 3.9–10.3)
lymph#: 1.5 10*3/uL (ref 0.9–3.3)

## 2015-08-28 MED ORDER — DARBEPOETIN ALFA-POLYSORBATE 500 MCG/ML IJ SOLN
200.0000 ug | Freq: Once | INTRAMUSCULAR | Status: DC
  Administered 2015-08-28: 200 ug via SUBCUTANEOUS

## 2015-08-28 MED ORDER — DARBEPOETIN ALFA 500 MCG/ML IJ SOSY
500.0000 ug | PREFILLED_SYRINGE | Freq: Once | INTRAMUSCULAR | Status: AC
  Filled 2015-08-28: qty 1

## 2015-08-28 NOTE — Patient Instructions (Signed)
Darbepoetin Alfa injection What is this medicine? DARBEPOETIN ALFA (dar be POE e tin AL fa) helps your body make more red blood cells. It is used to treat anemia caused by chronic kidney failure and chemotherapy. This medicine may be used for other purposes; ask your health care provider or pharmacist if you have questions. What should I tell my health care provider before I take this medicine? They need to know if you have any of these conditions: -blood clotting disorders or history of blood clots -cancer patient not on chemotherapy -cystic fibrosis -heart disease, such as angina, heart failure, or a history of a heart attack -hemoglobin level of 12 g/dL or greater -high blood pressure -low levels of folate, iron, or vitamin B12 -seizures -an unusual or allergic reaction to darbepoetin, erythropoietin, albumin, hamster proteins, latex, other medicines, foods, dyes, or preservatives -pregnant or trying to get pregnant -breast-feeding How should I use this medicine? This medicine is for injection into a vein or under the skin. It is usually given by a health care professional in a hospital or clinic setting. If you get this medicine at home, you will be taught how to prepare and give this medicine. Do not shake the solution before you withdraw a dose. Use exactly as directed. Take your medicine at regular intervals. Do not take your medicine more often than directed. It is important that you put your used needles and syringes in a special sharps container. Do not put them in a trash can. If you do not have a sharps container, call your pharmacist or healthcare provider to get one. Talk to your pediatrician regarding the use of this medicine in children. While this medicine may be used in children as young as 1 year for selected conditions, precautions do apply. Overdosage: If you think you have taken too much of this medicine contact a poison control center or emergency room at once. NOTE:  This medicine is only for you. Do not share this medicine with others. What if I miss a dose? If you miss a dose, take it as soon as you can. If it is almost time for your next dose, take only that dose. Do not take double or extra doses. What may interact with this medicine? Do not take this medicine with any of the following medications: -epoetin alfa This list may not describe all possible interactions. Give your health care provider a list of all the medicines, herbs, non-prescription drugs, or dietary supplements you use. Also tell them if you smoke, drink alcohol, or use illegal drugs. Some items may interact with your medicine. What should I watch for while using this medicine? Visit your prescriber or health care professional for regular checks on your progress and for the needed blood tests and blood pressure measurements. It is especially important for the doctor to make sure your hemoglobin level is in the desired range, to limit the risk of potential side effects and to give you the best benefit. Keep all appointments for any recommended tests. Check your blood pressure as directed. Ask your doctor what your blood pressure should be and when you should contact him or her. As your body makes more red blood cells, you may need to take iron, folic acid, or vitamin B supplements. Ask your doctor or health care provider which products are right for you. If you have kidney disease continue dietary restrictions, even though this medication can make you feel better. Talk with your doctor or health care professional about the   foods you eat and the vitamins that you take. What side effects may I notice from receiving this medicine? Side effects that you should report to your doctor or health care professional as soon as possible: -allergic reactions like skin rash, itching or hives, swelling of the face, lips, or tongue -breathing problems -changes in vision -chest pain -confusion, trouble speaking  or understanding -feeling faint or lightheaded, falls -high blood pressure -muscle aches or pains -pain, swelling, warmth in the leg -rapid weight gain -severe headaches -sudden numbness or weakness of the face, arm or leg -trouble walking, dizziness, loss of balance or coordination -seizures (convulsions) -swelling of the ankles, feet, hands -unusually weak or tired Side effects that usually do not require medical attention (report to your doctor or health care professional if they continue or are bothersome): -diarrhea -fever, chills (flu-like symptoms) -headaches -nausea, vomiting -redness, stinging, or swelling at site where injected This list may not describe all possible side effects. Call your doctor for medical advice about side effects. You may report side effects to FDA at 1-800-FDA-1088. Where should I keep my medicine? Keep out of the reach of children. Store in a refrigerator between 2 and 8 degrees C (36 and 46 degrees F). Do not freeze. Do not shake. Throw away any unused portion if using a single-dose vial. Throw away any unused medicine after the expiration date. NOTE: This sheet is a summary. It may not cover all possible information. If you have questions about this medicine, talk to your doctor, pharmacist, or health care provider.    2016, Elsevier/[employer]. (2007-12-22 10:23:57)  

## 2015-08-28 NOTE — Addendum Note (Signed)
Addended by: Henreitta Leber E on: 08/28/2015 12:40 PM   Modules accepted: Orders

## 2015-08-30 ENCOUNTER — Encounter: Payer: Self-pay | Admitting: Cardiovascular Disease

## 2015-09-10 NOTE — Progress Notes (Signed)
Patient ID: [name] [name], female   DOB: Apr 02, 1931, 82 y.o.   MRN: [id number]   Schwanna returns today for F/U of HTN, palpitations and fatigue. She was hospitalized 2014 for a diverticular bleed. This seems stable and she has F/U with Dr. Earlean [name]. She has had palpitations. Event monitor  Benign  with no significant arrythmias 2014 . She has been compliant with her BP meds. She has had a previous RCEA by Dr. Amedeo [name]. 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 She denies SSCP, palpitations, dyspnea, PND orthopnea and edema   She is on a study at [facility] for BP And gets it followed every 3 months   Hospitalized at [facility] for syncope in March  2015 Subsequently had chest pain and cath done Had CABG  3/15 with Dr [name] [name]  SVG d RCA, SVG OM1, distal circumflex and LIMA LAD  Had brief periop afib Rx with amidarone and left hospital in [place]  07/11/14   60-79% left ICA stenosis  Needs f/u 12/16    Some issues with Gout but still drinking wine, eating red meat and shrimp   ROS: Denies fever, malais, weight loss, blurry vision, decreased visual acuity, cough, sputum, SOB, hemoptysis, pleuritic pain, palpitaitons, heartburn, abdominal pain, melena, lower extremity edema, claudication, or rash.  All other systems reviewed and negative  General: Affect appropriate Pale elderly female  HEENT: normal Neck supple with no adenopathy JVP normal  Left bruit   no thyromegaly Lungs clear with no wheezing and good diaphragmatic motion Heart:  S1/S2 no murmur, no rub, gallop or click post sternotimy PMI normal Abdomen: benighn, BS positve, no tenderness, no AAA no bruit.  No HSM or HJR Distal pulses intact with no bruits No edema Neuro non-focal Skin warm and dry No muscular weakness Brace on left knee    Current Outpatient Prescriptions  Medication Sig Dispense Refill  . acetaminophen  (TYLENOL) 650 MG CR tablet Take 325 mg by mouth every 8 (eight) hours as needed for pain (arthritis).     [name] albuterol (PROVENTIL HFA;VENTOLIN HFA) 108 (90 Base) MCG/ACT inhaler Inhale 1-2 puffs into the lungs every 6 (six) hours as needed for wheezing or shortness of breath. 1 Inhaler 1  . aspirin 325 MG EC tablet Take 325 mg by mouth daily.    [name] atorvastatin (LIPITOR) 40 MG tablet Take 40 mg by mouth every other day.    . b complex vitamins capsule Take 1 capsule by mouth daily.    . Biotin 5000 MCG CAPS Take 1 capsule by mouth daily.    . Cholecalciferol (EQL VITAMIN D3) 1000 UNITS tablet Take 1 tablet (1,000 Units total) by mouth daily. 100 tablet 3  . darbepoetin (ARANESP) 100 MCG/0.5ML SOLN injection Inject 100 mcg into the skin every 8 (eight) weeks.    [name] escitalopram (LEXAPRO) 5 MG tablet Take 1 tablet (5 mg total) by mouth daily. 30 tablet 5  . furosemide (LASIX) 20 MG tablet TAKE 1 TABLET BY MOUTH DAILY 30 tablet 5  . levothyroxine (SYNTHROID, LEVOTHROID) 50 MCG tablet TAKE 1 TABLET BY MOUTH EVERY DAY BEFORE BREAKFAST 90 tablet 3  . LORazepam (ATIVAN) 0.5 MG tablet Take 1 tablet (0.5 mg total) by mouth every 6 (six) hours as needed. for anxiety 30 tablet 2  . metoprolol tartrate (LOPRESSOR) 25 MG tablet Take 0.5  tablets (12.5 mg total) by mouth 2 (two) times daily. 90 tablet 1  . pantoprazole (PROTONIX) 40 MG tablet TAKE 1 TABLET BY MOUTH DAILY 90 tablet 3  . sennosides-docusate sodium (SENOKOT-S) 8.6-50 MG tablet Take 1 tablet by mouth daily.      No current facility-administered medications for this visit.    Facility-Administered Medications Ordered in Other Visits  Medication Dose Route Frequency Provider Last Rate Last Dose  . Darbepoetin Alfa (ARANESP) injection 500 mcg  500 mcg Subcutaneous Once [name] Merle, MD        Allergies  Codeine sulfate; Darvon [propoxyphene]; Propoxyphene napsylate; Sulfacetamide sodium-sulfur; Sulfamethoxazole-trimethoprim; and  Zolpidem  Electrocardiogram:   10/23/12  SR rate 75 nonspecific ST changes incorrectly read by computer as accelerated junctional  8/15  SR rate 73 normal  09/19/14  SR rate 70  nornmal  Assessment and Plan PAF:  In setting post CABG NSR asa/ lopresser no anticoagulation  CAD/CABG:  2015  [name] [name]  SVG OM/PLB  SVG RCA  LIMA LAD  No angina continue medical Rx  Carotid:  Left ICA 60-79% stenosis.  No TIA symptoms.  Continue antiplatelet Rx and F/U carotid duplex  12/2015  Chol:   Lab Results  Component Value Date   LDLCALC 87 06/07/2015     [name] [name]

## 2015-09-13 ENCOUNTER — Encounter (INDEPENDENT_AMBULATORY_CARE_PROVIDER_SITE_OTHER): Payer: Self-pay

## 2015-09-13 ENCOUNTER — Ambulatory Visit (INDEPENDENT_AMBULATORY_CARE_PROVIDER_SITE_OTHER): Payer: Medicare Other | Admitting: Cardiovascular Disease

## 2015-09-13 ENCOUNTER — Encounter: Payer: Self-pay | Admitting: Cardiovascular Disease

## 2015-09-13 VITALS — BP 160/80 | HR 64 | Ht 63.0 in | Wt 167.0 lb

## 2015-09-13 DIAGNOSIS — I6523 Occlusion and stenosis of bilateral carotid arteries: Secondary | ICD-10-CM

## 2015-09-13 NOTE — Patient Instructions (Signed)

## 2015-10-04 ENCOUNTER — Telehealth: Payer: Self-pay | Admitting: Internal Medicine

## 2015-10-04 NOTE — Telephone Encounter (Signed)
OK to fill this prescription with additional refills x2 Thank you!  

## 2015-10-04 NOTE — Telephone Encounter (Signed)
Patient is requesting a renewal of LORazepam (ATIVAN) 0.5 MG tablet [[id number]]  . She states its the thing that works best for her sleep

## 2015-10-05 MED ORDER — LORAZEPAM 0.5 MG PO TABS: 0.5000 mg | ORAL_TABLET | Freq: Four times a day (QID) | ORAL | 2 refills | Status: DC | PRN

## 2015-10-05 NOTE — Telephone Encounter (Signed)
Called pt no answer LMOM refill has been called into [facility]...[name] [name]

## 2015-10-09 ENCOUNTER — Encounter: Payer: Self-pay | Admitting: Internal Medicine

## 2015-10-09 ENCOUNTER — Ambulatory Visit (INDEPENDENT_AMBULATORY_CARE_PROVIDER_SITE_OTHER): Payer: Medicare Other | Admitting: Internal Medicine

## 2015-10-09 DIAGNOSIS — I6523 Occlusion and stenosis of bilateral carotid arteries: Secondary | ICD-10-CM

## 2015-10-09 DIAGNOSIS — F4321 Adjustment disorder with depressed mood: Secondary | ICD-10-CM

## 2015-10-09 DIAGNOSIS — I480 Paroxysmal atrial fibrillation: Secondary | ICD-10-CM

## 2015-10-09 DIAGNOSIS — G4700 Insomnia, unspecified: Secondary | ICD-10-CM

## 2015-10-09 DIAGNOSIS — I10 Essential (primary) hypertension: Secondary | ICD-10-CM | POA: Diagnosis not present

## 2015-10-09 DIAGNOSIS — Z23 Encounter for immunization: Secondary | ICD-10-CM | POA: Diagnosis not present

## 2015-10-09 DIAGNOSIS — I5020 Unspecified systolic (congestive) heart failure: Secondary | ICD-10-CM | POA: Diagnosis not present

## 2015-10-09 DIAGNOSIS — Z789 Other specified health status: Secondary | ICD-10-CM

## 2015-10-09 MED ORDER — LORAZEPAM 0.5 MG PO TABS: 0.5000 mg | ORAL_TABLET | Freq: Four times a day (QID) | ORAL | 3 refills | Status: DC | PRN

## 2015-10-09 NOTE — Progress Notes (Signed)
Pre visit review using our clinic review tool, if applicable. No additional management support is needed unless otherwise documented below in the visit note. 

## 2015-10-09 NOTE — Assessment & Plan Note (Signed)
Lorazepam prn  Potential benefits of a long term benzodiazepines  use as well as potential risks  and complications were explained to the patient and were aknowledged.  

## 2015-10-09 NOTE — Assessment & Plan Note (Signed)
Furosemide, Lopressor

## 2015-10-09 NOTE — Assessment & Plan Note (Signed)
Using a walker 

## 2015-10-09 NOTE — Assessment & Plan Note (Signed)
On ASA, Toprol

## 2015-10-09 NOTE — Assessment & Plan Note (Signed)
Lopressor, Furosemide

## 2015-10-09 NOTE — Assessment & Plan Note (Signed)
Lexapro - low dose 

## 2015-10-09 NOTE — Progress Notes (Signed)
Subjective:  Patient ID: [name] [name], female    DOB: 09-11-1931  Age: 82 y.o. MRN: [id number]  CC: No chief complaint on file.   HPI STINA GANE presents for HTN, CAD, GERD f/u  Outpatient Medications Prior to Visit  Medication Sig Dispense Refill  . acetaminophen (TYLENOL) 650 MG CR tablet Take 325 mg by mouth every 8 (eight) hours as needed for pain (arthritis).     [name] albuterol (PROVENTIL HFA;VENTOLIN HFA) 108 (90 Base) MCG/ACT inhaler Inhale 1-2 puffs into the lungs every 6 (six) hours as needed for wheezing or shortness of breath. 1 Inhaler 1  . aspirin 325 MG EC tablet Take 325 mg by mouth daily.    [name] atorvastatin (LIPITOR) 40 MG tablet Take 40 mg by mouth every other day.    . b complex vitamins capsule Take 1 capsule by mouth daily.    . Biotin 5000 MCG CAPS Take 1 capsule by mouth daily.    . Cholecalciferol (EQL VITAMIN D3) 1000 UNITS tablet Take 1 tablet (1,000 Units total) by mouth daily. 100 tablet 3  . darbepoetin (ARANESP) 100 MCG/0.5ML SOLN injection Inject 100 mcg into the skin every 8 (eight) weeks.    [name] escitalopram (LEXAPRO) 5 MG tablet Take 1 tablet (5 mg total) by mouth daily. 30 tablet 5  . furosemide (LASIX) 20 MG tablet TAKE 1 TABLET BY MOUTH DAILY 30 tablet 5  . levothyroxine (SYNTHROID, LEVOTHROID) 50 MCG tablet TAKE 1 TABLET BY MOUTH EVERY DAY BEFORE BREAKFAST 90 tablet 3  . LORazepam (ATIVAN) 0.5 MG tablet Take 1 tablet (0.5 mg total) by mouth every 6 (six) hours as needed. for anxiety 30 tablet 2  . metoprolol tartrate (LOPRESSOR) 25 MG tablet Take 0.5 tablets (12.5 mg total) by mouth 2 (two) times daily. 90 tablet 1  . pantoprazole (PROTONIX) 40 MG tablet TAKE 1 TABLET BY MOUTH DAILY 90 tablet 3  . sennosides-docusate sodium (SENOKOT-S) 8.6-50 MG tablet Take 1 tablet by mouth daily.      Facility-Administered Medications Prior to Visit  Medication Dose Route Frequency Provider Last Rate Last Dose  . Darbepoetin Alfa (ARANESP) injection 500 mcg  500  mcg Subcutaneous Once [name], MD        ROS Review of Systems  Constitutional: Positive for fatigue. Negative for activity change, appetite change, chills and unexpected weight change.  HENT: Negative for congestion, mouth sores and sinus pressure.   Eyes: Negative for visual disturbance.  Respiratory: Negative for cough and chest tightness.   Gastrointestinal: Negative for abdominal pain and nausea.  Genitourinary: Negative for difficulty urinating, frequency and vaginal pain.  Musculoskeletal: Negative for back pain and gait problem.  Skin: Negative for pallor and rash.  Neurological: Negative for dizziness, tremors, weakness, numbness and headaches.  Psychiatric/Behavioral: Negative for confusion and sleep disturbance. The patient is nervous/anxious.     Objective:  BP (!) 160/70   Pulse 73   Temp 98.6 F (37 C) (Oral)   Wt 166 lb (75.3 kg)   SpO2 98%   BMI 29.41 kg/m   BP Readings from Last 3 Encounters:  10/09/15 (!) 160/70  09/13/15 (!) 160/80  08/28/15 (!) 150/60    Wt Readings from Last 3 Encounters:  10/09/15 166 lb (75.3 kg)  09/13/15 167 lb (75.8 kg)  08/10/15 166 lb (75.3 kg)    Physical Exam  Constitutional: She appears well-developed. No distress.  HENT:  Head: Normocephalic.  Right Ear: External ear normal.  Left Ear: External ear  normal.  Nose: Nose normal.  Mouth/Throat: Oropharynx is clear and moist.  Eyes: Conjunctivae are normal. Pupils are equal, round, and reactive to light. Right eye exhibits no discharge. Left eye exhibits no discharge.  Neck: Normal range of motion. Neck supple. No JVD present. No tracheal deviation present. No thyromegaly present.  Cardiovascular: Normal rate, regular rhythm and normal heart sounds.   Pulmonary/Chest: No stridor. No respiratory distress. She has no wheezes.  Abdominal: Soft. Bowel sounds are normal. She exhibits no distension and no mass. There is no tenderness. There is no rebound and no guarding.    Musculoskeletal: She exhibits no edema or tenderness.  Lymphadenopathy:    She has no cervical adenopathy.  Neurological: She displays normal reflexes. No cranial nerve deficit. She exhibits normal muscle tone. Coordination normal.  Skin: No rash noted. No erythema.  Psychiatric: She has a normal mood and affect. Her behavior is normal. Judgment and thought content normal.    Lab Results  Component Value Date   WBC 6.7 08/28/2015   HGB 10.5 (L) 08/28/2015   HCT 31.1 (L) 08/28/2015   PLT 360 08/28/2015   GLUCOSE 97 06/07/2015   CHOL 154 06/07/2015   TRIG 96.0 06/07/2015   HDL 47.60 06/07/2015   LDLCALC 87 06/07/2015   ALT 14 06/07/2015   AST 22 06/07/2015   NA 139 06/07/2015   K 4.0 06/07/2015   CL 105 06/07/2015   CREATININE 1.78 (H) 06/07/2015   BUN 34 (H) 06/07/2015   CO2 27 06/07/2015   TSH 0.60 06/07/2015   INR 0.98 05/03/2014    Mm Screening Breast Tomo Bilateral  Result Date: 06/01/2015 CLINICAL DATA:  Screening. EXAM: 2D DIGITAL SCREENING BILATERAL MAMMOGRAM WITH CAD AND ADJUNCT TOMO COMPARISON:  Previous exam(s). ACR Breast Density Category b: There are scattered areas of fibroglandular density. FINDINGS: There are no findings suspicious for malignancy. Images were processed with CAD. IMPRESSION: No mammographic evidence of malignancy. A result letter of this screening mammogram will be mailed directly to the patient. RECOMMENDATION: Screening mammogram in one year. (Code:SM-B-01Y) BI-RADS CATEGORY  1: Negative. Electronically Signed   By: Nolon Nations M.D.   On: 06/01/2015 16:42    Assessment & Plan:   There are no diagnoses linked to this encounter. I am having Ms. Servello maintain her aspirin, sennosides-docusate sodium, acetaminophen, b complex vitamins, Biotin, darbepoetin, Cholecalciferol, metoprolol tartrate, furosemide, albuterol, pantoprazole, levothyroxine, escitalopram, atorvastatin, and LORazepam.  No orders of the defined types were placed in this  encounter.    Follow-up: No Follow-up on file.  [name], MD

## 2015-10-09 NOTE — Addendum Note (Signed)
Addended by: Cresenciano Lick on: 10/09/2015 02:28 PM   Modules accepted: Orders

## 2015-10-20 ENCOUNTER — Ambulatory Visit: Payer: Medicare Other | Admitting: Internal Medicine

## 2015-10-21 ENCOUNTER — Emergency Department (HOSPITAL_COMMUNITY): Payer: Medicare Other

## 2015-10-21 ENCOUNTER — Encounter (HOSPITAL_COMMUNITY): Payer: Self-pay

## 2015-10-21 ENCOUNTER — Emergency Department (HOSPITAL_COMMUNITY)
Admission: EM | Admit: 2015-10-21 | Discharge: 2015-10-21 | Disposition: A | Discharge status: Home / Self Care | Payer: Medicare Other | Attending: Emergency Medicine | Admitting: Emergency Medicine

## 2015-10-21 DIAGNOSIS — I509 Heart failure, unspecified: Secondary | ICD-10-CM | POA: Diagnosis not present

## 2015-10-21 DIAGNOSIS — I252 Old myocardial infarction: Secondary | ICD-10-CM | POA: Insufficient documentation

## 2015-10-21 DIAGNOSIS — I130 Hypertensive heart and chronic kidney disease with heart failure and stage 1 through stage 4 chronic kidney disease, or unspecified chronic kidney disease: Secondary | ICD-10-CM | POA: Insufficient documentation

## 2015-10-21 DIAGNOSIS — Y999 Unspecified external cause status: Secondary | ICD-10-CM | POA: Diagnosis not present

## 2015-10-21 DIAGNOSIS — N390 Urinary tract infection, site not specified: Secondary | ICD-10-CM

## 2015-10-21 DIAGNOSIS — S3690XA Unspecified injury of unspecified intra-abdominal organ, initial encounter: Secondary | ICD-10-CM | POA: Diagnosis not present

## 2015-10-21 DIAGNOSIS — Z79899 Other long term (current) drug therapy: Secondary | ICD-10-CM | POA: Diagnosis not present

## 2015-10-21 DIAGNOSIS — Y9389 Activity, other specified: Secondary | ICD-10-CM | POA: Diagnosis not present

## 2015-10-21 DIAGNOSIS — R109 Unspecified abdominal pain: Secondary | ICD-10-CM | POA: Diagnosis not present

## 2015-10-21 DIAGNOSIS — N184 Chronic kidney disease, stage 4 (severe): Secondary | ICD-10-CM | POA: Insufficient documentation

## 2015-10-21 DIAGNOSIS — J45909 Unspecified asthma, uncomplicated: Secondary | ICD-10-CM | POA: Insufficient documentation

## 2015-10-21 DIAGNOSIS — R51 Headache: Secondary | ICD-10-CM | POA: Insufficient documentation

## 2015-10-21 DIAGNOSIS — S3991XA Unspecified injury of abdomen, initial encounter: Secondary | ICD-10-CM | POA: Diagnosis not present

## 2015-10-21 DIAGNOSIS — M545 Low back pain: Secondary | ICD-10-CM | POA: Diagnosis not present

## 2015-10-21 DIAGNOSIS — Y929 Unspecified place or not applicable: Secondary | ICD-10-CM | POA: Insufficient documentation

## 2015-10-21 DIAGNOSIS — R10819 Abdominal tenderness, unspecified site: Secondary | ICD-10-CM | POA: Diagnosis not present

## 2015-10-21 DIAGNOSIS — Z7982 Long term (current) use of aspirin: Secondary | ICD-10-CM | POA: Insufficient documentation

## 2015-10-21 DIAGNOSIS — R1031 Right lower quadrant pain: Secondary | ICD-10-CM | POA: Diagnosis not present

## 2015-10-21 DIAGNOSIS — Z7951 Long term (current) use of inhaled steroids: Secondary | ICD-10-CM | POA: Diagnosis not present

## 2015-10-21 DIAGNOSIS — E039 Hypothyroidism, unspecified: Secondary | ICD-10-CM | POA: Diagnosis not present

## 2015-10-21 DIAGNOSIS — W19XXXA Unspecified fall, initial encounter: Secondary | ICD-10-CM

## 2015-10-21 DIAGNOSIS — S0990XA Unspecified injury of head, initial encounter: Secondary | ICD-10-CM | POA: Diagnosis not present

## 2015-10-21 DIAGNOSIS — W1839XA Other fall on same level, initial encounter: Secondary | ICD-10-CM | POA: Diagnosis not present

## 2015-10-21 DIAGNOSIS — S3992XA Unspecified injury of lower back, initial encounter: Secondary | ICD-10-CM | POA: Diagnosis not present

## 2015-10-21 DIAGNOSIS — I700 Atherosclerosis of aorta: Secondary | ICD-10-CM | POA: Diagnosis not present

## 2015-10-21 LAB — CBC WITH DIFFERENTIAL/PLATELET
Basophils Absolute: 0.1 10*3/uL (ref 0.0–0.1)
Basophils Relative: 0 %
EOS ABS: 0.1 10*3/uL (ref 0.0–0.7)
Eosinophils Relative: 1 %
HEMATOCRIT: 33.8 % — AB (ref 36.0–46.0)
HEMOGLOBIN: 11.5 g/dL — AB (ref 12.0–15.0)
LYMPHS ABS: 1.7 10*3/uL (ref 0.7–4.0)
LYMPHS PCT: 15 %
MCH: 30.9 pg (ref 26.0–34.0)
MCHC: 34 g/dL (ref 30.0–36.0)
MCV: 90.9 fL (ref 78.0–100.0)
MONOS PCT: 6 %
Monocytes Absolute: 0.7 10*3/uL (ref 0.1–1.0)
NEUTROS ABS: 8.9 10*3/uL — AB (ref 1.7–7.7)
NEUTROS PCT: 78 %
Platelets: 374 10*3/uL (ref 150–400)
RBC: 3.72 MIL/uL — AB (ref 3.87–5.11)
RDW: 13.7 % (ref 11.5–15.5)
WBC: 11.5 10*3/uL — AB (ref 4.0–10.5)

## 2015-10-21 LAB — URINALYSIS, ROUTINE W REFLEX MICROSCOPIC
Bilirubin Urine: NEGATIVE
Glucose, UA: NEGATIVE mg/dL
HGB URINE DIPSTICK: NEGATIVE
Ketones, ur: NEGATIVE mg/dL
Nitrite: NEGATIVE
PH: 6 (ref 5.0–8.0)
Protein, ur: NEGATIVE mg/dL
SPECIFIC GRAVITY, URINE: 1.008 (ref 1.005–1.030)

## 2015-10-21 LAB — COMPREHENSIVE METABOLIC PANEL
ALK PHOS: 86 U/L (ref 38–126)
ALT: 17 U/L (ref 14–54)
AST: 31 U/L (ref 15–41)
Albumin: 4.2 g/dL (ref 3.5–5.0)
Anion gap: 10 (ref 5–15)
BILIRUBIN TOTAL: 1.1 mg/dL (ref 0.3–1.2)
BUN: 37 mg/dL — ABNORMAL HIGH (ref 6–20)
CALCIUM: 9.6 mg/dL (ref 8.9–10.3)
CO2: 27 mmol/L (ref 22–32)
CREATININE: 1.89 mg/dL — AB (ref 0.44–1.00)
Chloride: 98 mmol/L — ABNORMAL LOW (ref 101–111)
GFR calc non Af Amer: 24 mL/min — ABNORMAL LOW (ref >60)
GFR, EST AFRICAN AMERICAN: 27 mL/min — AB (ref >60)
GLUCOSE: 98 mg/dL (ref 65–99)
Potassium: 3.5 mmol/L (ref 3.5–5.1)
SODIUM: 135 mmol/L (ref 135–145)
TOTAL PROTEIN: 7.8 g/dL (ref 6.5–8.1)

## 2015-10-21 LAB — URINE MICROSCOPIC-ADD ON: RBC / HPF: NONE SEEN RBC/hpf (ref 0–5)

## 2015-10-21 LAB — LIPASE, BLOOD: Lipase: 24 U/L (ref 11–51)

## 2015-10-21 MED ORDER — CEPHALEXIN 500 MG PO CAPS: 500.0000 mg | ORAL_CAPSULE | Freq: Three times a day (TID) | ORAL | 0 refills | Status: AC

## 2015-10-21 MED ORDER — FENTANYL CITRATE (PF) 100 MCG/2ML IJ SOLN
25.0000 ug | Freq: Once | INTRAMUSCULAR | Status: AC
  Administered 2015-10-21: 25 ug via INTRAVENOUS
  Filled 2015-10-21: qty 2

## 2015-10-21 MED ORDER — ONDANSETRON HCL 4 MG/2ML IJ SOLN
4.0000 mg | Freq: Once | INTRAMUSCULAR | Status: AC
  Administered 2015-10-21: 4 mg via INTRAVENOUS
  Filled 2015-10-21: qty 2

## 2015-10-21 MED ORDER — CYCLOBENZAPRINE HCL 5 MG PO TABS: 5.0000 mg | ORAL_TABLET | Freq: Two times a day (BID) | ORAL | 0 refills | Status: DC | PRN

## 2015-10-21 NOTE — ED Triage Notes (Signed)
Per EMS, pt from home.  Pt was pulling weeds in garden. Fell backwards and c/o rt flank pain.  No LOC or head injury.  Pt is ambulatory. Alert and oriented. Vitals: 160/80, hr 84, resp 18

## 2015-10-21 NOTE — ED Notes (Signed)
Bed: WA09 Expected date: 10/21/15 Expected time: 4:59 PM Means of arrival: Ambulance Comments: FAll

## 2015-10-23 ENCOUNTER — Ambulatory Visit: Payer: Self-pay

## 2015-10-23 ENCOUNTER — Other Ambulatory Visit: Payer: Self-pay

## 2015-10-23 NOTE — ED Provider Notes (Signed)
[facility] DEPT Provider Note   CSN: [id number] Arrival date & time: 10/21/15  1705     History   Chief Complaint Chief Complaint  Patient presents with  . Fall  . Flank Pain    HPI [name] [name] is a 82 y.o. female.  HPI   Fell while pulling/cuitting vines, from standing hit, head. Not sure if LOC, if there was it was brief, then able to get up. Walked, and walked up stairs.  Pain severe right flank, right back, rigth abdomen, pain with moving legs in back.  Not on blood thinners.  Past Medical History:  Diagnosis Date  . Anemia, unspecified   . Anxiety state, unspecified   . Depressive disorder, not elsewhere classified   . Diverticulosis of colon (without mention of hemorrhage)   . Diverticulosis of colon with hemorrhage   . Esophageal reflux   . Lumbago   . Myocardial infarction   . Other and unspecified hyperlipidemia   . Persistent disorder of initiating or maintaining sleep   . Thyroid disease   . Unspecified asthma(493.90)   . Unspecified disorder resulting from impaired renal function   . Unspecified essential hypertension    Dr [name]  . Unspecified osteomyelitis, site unspecified ([place])   . Unspecified vitamin D deficiency     Patient Active Problem List   Diagnosis Date Noted  . Neck pain, musculoskeletal 08/10/2015  . Gout 06/06/2015  . Anemia 03/20/2015  . Piriformis syndrome of left side 12/13/2014  . Osteoporosis 09/14/2014  . Compression fracture of T12 vertebra with delayed healing 05/04/2014  . Poor tolerance for ambulation   . Back pain   . Decreased ambulation status 05/02/2014  . Left-sided low back pain with sciatica 05/02/2014  . CKD (chronic kidney disease) stage 4, GFR 15-29 ml/min (HCC) 05/02/2014  . Situational depression 10/26/2013  . Pes anserine bursitis 10/05/2013  . Alopecia 10/01/2013  . Primary localized osteoarthrosis, lower leg 09/07/2013  . Left leg weakness 08/25/2013  . Fall against object 08/25/2013  . Left  ankle pain 08/25/2013  . Thyroid nodule 04/27/2013  . CHF (congestive heart failure) ([place]) 04/20/2013  . PNA (pneumonia) 04/20/2013  . A-fib ([place]) 04/18/2013  . Nausea alone 04/16/2013  . Unspecified constipation 04/16/2013  . Unspecified hypothyroidism 04/16/2013  . Cough 04/16/2013  . Disuse syndrome 03/15/2013  . Influenza due to influenza A virus 03/07/2013  . Acute non-ST segment elevation myocardial infarction ([place]) 03/06/2013  . Cramps, extremity 07/01/2012  . Minimal cognitive impairment 10/07/2011  . Restless leg 09/16/2011  . Neuropathy ([place]) 08/16/2011  . Anemia in chronic renal disease 12/08/2010  . BRONCHITIS NOT SPECIFIED AS ACUTE OR CHRONIC 03/20/2009  . Bilateral carotid bruits 01/12/2009  . Coronary atherosclerosis 10/21/2008  . PANCREATITIS 10/21/2008  . Disorder resulting from impaired renal function 10/21/2008  . OSTEOMYELITIS 10/21/2008  . Diverticulosis of colon with hemorrhage 09/20/2008  . DEHYDRATION (VOLUME DEPLETION) 04/11/2008  . SYNCOPE 04/11/2008  . UNSPECIFIED DISORDER OF URETHRA&URINARY TRACT 02/04/2008  . Vitamin D deficiency 12/28/2007  . Asthma 10/28/2007  . PYELONEPHRITIS 10/28/2007  . Fatigue 10/28/2007  . Dyslipidemia 06/23/2007  . Anxiety state 06/23/2007  . INSOMNIA, PERSISTENT 06/23/2007  . Essential hypertension 06/23/2007  . PERIPHERAL VASCULAR DISEASE 06/23/2007  . GERD 06/23/2007  . DIVERTICULOSIS, COLON 06/23/2007  . LOW BACK PAIN 06/23/2007    Past Surgical History:  Procedure Laterality Date  . BONE MARROW BIOPSY    . CORONARY ARTERY BYPASS GRAFT  03/25/13   x4. CABS ON PUMP-Surgeon  Jomarie Longs.  [name] PARTIAL COLECTOMY  1995   diverticulosis of colon iwth hemorrhage.   . status post right carotid enterectomy      OB History    No data available       Home Medications    Prior to Admission medications   Medication Sig Start Date End Date Taking? Authorizing Provider  aspirin 325 MG EC tablet Take 325 mg by mouth  daily.   Yes Historical Provider, MD  atorvastatin (LIPITOR) 40 MG tablet Take 40 mg by mouth every other day. 07/01/15  Yes Historical Provider, MD  b complex vitamins capsule Take 1 capsule by mouth daily.   Yes Historical Provider, MD  Biotin 5000 MCG CAPS Take 1 capsule by mouth daily.   Yes Historical Provider, MD  Cholecalciferol (EQL VITAMIN D3) 1000 UNITS tablet Take 1 tablet (1,000 Units total) by mouth daily. 05/12/14  Yes [name] Plotnikov, MD  darbepoetin (ARANESP) 100 MCG/0.5ML SOLN injection Inject 100 mcg into the skin every 8 (eight) weeks.   Yes Historical Provider, MD  escitalopram (LEXAPRO) 5 MG tablet Take 1 tablet (5 mg total) by mouth daily. 08/10/15  Yes [name] Plotnikov, MD  furosemide (LASIX) 20 MG tablet TAKE 1 TABLET BY MOUTH DAILY Patient taking differently: Take 0.5 tablet by mouth BID 04/19/15  Yes Josue Hector, MD  levothyroxine (SYNTHROID, LEVOTHROID) 50 MCG tablet TAKE 1 TABLET BY MOUTH EVERY DAY BEFORE BREAKFAST 05/31/15  Yes [name] Plotnikov, MD  LORazepam (ATIVAN) 0.5 MG tablet Take 1 tablet (0.5 mg total) by mouth every 6 (six) hours as needed. for anxiety 10/09/15  Yes [name] Plotnikov, MD  metoprolol tartrate (LOPRESSOR) 25 MG tablet Take 0.5 tablets (12.5 mg total) by mouth 2 (two) times daily. 02/17/15  Yes Josue Hector, MD  pantoprazole (PROTONIX) 40 MG tablet TAKE 1 TABLET BY MOUTH DAILY 05/31/15  Yes Cassandria Anger, MD  sennosides-docusate sodium (SENOKOT-S) 8.6-50 MG tablet Take 1 tablet by mouth daily.    Yes Historical Provider, MD  acetaminophen (TYLENOL) 650 MG CR tablet Take 325 mg by mouth every 8 (eight) hours as needed for pain (arthritis).     Historical Provider, MD  albuterol (PROVENTIL HFA;VENTOLIN HFA) 108 (90 Base) MCG/ACT inhaler Inhale 1-2 puffs into the lungs every 6 (six) hours as needed for wheezing or shortness of breath. 05/11/15   Burnard Hawthorne, FNP  cephALEXin (KEFLEX) 500 MG capsule Take 1 capsule (500 mg total) by  mouth 3 (three) times daily. 10/21/15 10/28/15  Gareth Morgan, MD  cyclobenzaprine (FLEXERIL) 5 MG tablet Take 1-2 tablets (5-10 mg total) by mouth 2 (two) times daily as needed for muscle spasms. 10/21/15   Gareth Morgan, MD    Family History Family History  Problem Relation Age of Onset  . Hypertension Other   . COPD Mother   . Cancer Father     lymphoma    Social History Social History  Substance Use Topics  . Smoking status: Never Smoker  . Smokeless tobacco: Never Used  . Alcohol use 0.0 oz/week    1 - 2 Shots of liquor per week     Allergies   Codeine sulfate; Darvon [propoxyphene]; Propoxyphene napsylate; Sulfacetamide sodium-sulfur; Sulfamethoxazole-trimethoprim; and Zolpidem   Review of Systems Review of Systems  Constitutional: Negative for fever.  HENT: Negative for sore throat.   Eyes: Negative for visual disturbance.  Respiratory: Negative for cough and shortness of breath.   Cardiovascular: Negative for chest pain.  Gastrointestinal:  Positive for abdominal pain. Negative for nausea and vomiting.  Genitourinary: Negative for difficulty urinating.  Musculoskeletal: Positive for back pain. Negative for neck pain.  Skin: Negative for rash.  Neurological: Positive for headaches. Negative for dizziness, syncope (not sure, maybe) and light-headedness.     Physical Exam Updated Vital Signs BP 153/71   Pulse 75   Temp 98 F (36.7 C) (Oral)   Resp 18   SpO2 93%   Physical Exam  Constitutional: She is oriented to person, place, and time. She appears well-developed and well-nourished. No distress.  HENT:  Head: Normocephalic and atraumatic.  Eyes: Conjunctivae and EOM are normal.  Neck: Normal range of motion.  Cardiovascular: Normal rate, regular rhythm, normal heart sounds and intact distal pulses.  Exam reveals no gallop and no friction rub.   No murmur heard. Pulmonary/Chest: Effort normal and breath sounds normal. No respiratory distress. She has no  wheezes. She has no rales. She exhibits tenderness.  Abdominal: Soft. She exhibits no distension. There is tenderness (RUQ and right flank). There is no guarding.  Musculoskeletal: She exhibits no edema.       Cervical back: She exhibits no tenderness and no bony tenderness.       Thoracic back: She exhibits tenderness and bony tenderness.       Lumbar back: She exhibits tenderness and bony tenderness.  Neurological: She is alert and oriented to person, place, and time. She has normal strength. No cranial nerve deficit or sensory deficit. Coordination normal.  Skin: Skin is warm and dry. No rash noted. She is not diaphoretic. No erythema.  Nursing note and vitals reviewed.    ED Treatments / Results  Labs (all labs ordered are listed, but only abnormal results are displayed) Labs Reviewed  CBC WITH DIFFERENTIAL/PLATELET - Abnormal; Notable for the following:       Result Value   WBC 11.5 (*)    RBC 3.72 (*)    Hemoglobin 11.5 (*)    HCT 33.8 (*)    Neutro Abs 8.9 (*)    All other components within normal limits  COMPREHENSIVE METABOLIC PANEL - Abnormal; Notable for the following:    Chloride 98 (*)    BUN 37 (*)    Creatinine, Ser 1.89 (*)    GFR calc non Af Amer 24 (*)    GFR calc Af Amer 27 (*)    All other components within normal limits  URINALYSIS, ROUTINE W REFLEX MICROSCOPIC (NOT AT [facility]) - Abnormal; Notable for the following:    APPearance CLOUDY (*)    Leukocytes, UA MODERATE (*)    All other components within normal limits  URINE MICROSCOPIC-ADD ON - Abnormal; Notable for the following:    Squamous Epithelial / LPF 0-5 (*)    Bacteria, UA FEW (*)    Casts HYALINE CASTS (*)    All other components within normal limits  LIPASE, BLOOD    EKG  EKG Interpretation None       Radiology Ct Abdomen Pelvis Wo Contrast  Result Date: 10/21/2015 CLINICAL DATA:  Per EMS, pt from home. Pt was pulling weeds in garden today and [place] [address] backwards and now c/o rt flank pain.  No LOC or head injury. Pt is ambulatory. Alert and oriented. She is also saying that the hernia in her stomach is getting bigger. EXAM: CT CHEST, ABDOMEN AND PELVIS WITHOUT CONTRAST TECHNIQUE: Multidetector CT imaging of the chest, abdomen and pelvis was performed following the standard protocol without IV contrast. COMPARISON:  Lumbar MRI 05/02/2014. FINDINGS: CT CHEST FINDINGS Cardiovascular: No contour abnormality of the aorta to suggest dissection. No mediastinal hematoma. Noncontrast exam. Coronary artery calcification and aortic atherosclerotic calcification. No pericardial fluid Mediastinum/Nodes: No mediastinal fluid. Esophagus normal. Midline sternotomy. Lungs/Pleura: No pneumothorax, pulmonary contusion, or pleural fluid. Mild basilar atelectasis. Musculoskeletal: No evidence of rib fracture, scapular fracture, or clavicle fracture. CT ABDOMEN PELVIS FINDINGS Hepatobiliary: No hepatic injury or perihepatic hematoma. Gallbladder is unremarkable Pancreas: Unremarkable. No pancreatic ductal dilatation or surrounding inflammatory changes. Spleen: No splenic injury or perisplenic hematoma. Adrenals/Urinary Tract: No adrenal hemorrhage or renal injury identified. Bladder is unremarkable. Stomach/Bowel: Small hiatal hernia. Stomach, small-bowel normal. No mesenteric fluid. Diverticula throughout the colon without acute inflammation. Small ventral hernia RIGHT of midline with a single wall of colon extending through the small hernia which measures approximately 1.5 cm (image 73, series 2) Vascular/Lymphatic: Atherosclerotic calcification of the aorta. No evidence of dissection or aneurysm. Reproductive: Uterus normal Other: No free fluid the pelvis. Musculoskeletal: No evidence of pelvic fracture or spine fracture. Multiple compression fractures in the lumbar spine. This augmentation of a compression fracture at T12. No evidence of acute fracture. No paraspinal hematoma. IMPRESSION: Chest Impression: 1. No  evidence of thoracic trauma. 2. Coronary artery calcification and aortic atherosclerotic calcification. Abdomen / Pelvis Impression: 1. No evidence of abdominal or pelvic trauma. 2. Small ventral hernia contains a single wall of transverse colon (so-called Richter's hernia) 3.  Atherosclerotic calcification of the aorta. 4. Multiple compression fractures in the lower thoracic spine and lumbar spine not changed from comparison MRI. Electronically Signed   By: Suzy Bouchard M.D.   On: 10/21/2015 20:13   Ct Head Wo Contrast  Result Date: 10/21/2015 CLINICAL DATA:  Status post fall, head trauma EXAM: CT HEAD WITHOUT CONTRAST TECHNIQUE: Contiguous axial images were obtained from the base of the skull through the vertex without intravenous contrast. COMPARISON:  06/01/2011 FINDINGS: Brain: No evidence of acute infarction, hemorrhage, extra-axial collection, ventriculomegaly, or mass effect. Generalized cerebral atrophy. Periventricular white matter low attenuation likely secondary to microangiopathy. Vascular: Cerebrovascular atherosclerotic calcifications are noted. Skull: Negative for fracture or focal lesion. Sinuses/Orbits: Visualized portions of the orbits are unremarkable. Visualized paranasal sinuses are clear. Other: None. IMPRESSION: 1. No acute intracranial pathology. 2. Chronic microvascular disease and cerebral atrophy. Electronically Signed   By: Kathreen Devoid   On: 10/21/2015 20:14   Ct Chest Wo Contrast  Result Date: 10/21/2015 CLINICAL DATA:  Per EMS, pt from home. Pt was pulling weeds in garden today and [place] [address] backwards and now c/o rt flank pain. No LOC or head injury. Pt is ambulatory. Alert and oriented. She is also saying that the hernia in her stomach is getting bigger. EXAM: CT CHEST, ABDOMEN AND PELVIS WITHOUT CONTRAST TECHNIQUE: Multidetector CT imaging of the chest, abdomen and pelvis was performed following the standard protocol without IV contrast. COMPARISON:  Lumbar MRI 05/02/2014.  FINDINGS: CT CHEST FINDINGS Cardiovascular: No contour abnormality of the aorta to suggest dissection. No mediastinal hematoma. Noncontrast exam. Coronary artery calcification and aortic atherosclerotic calcification. No pericardial fluid Mediastinum/Nodes: No mediastinal fluid. Esophagus normal. Midline sternotomy. Lungs/Pleura: No pneumothorax, pulmonary contusion, or pleural fluid. Mild basilar atelectasis. Musculoskeletal: No evidence of rib fracture, scapular fracture, or clavicle fracture. CT ABDOMEN PELVIS FINDINGS Hepatobiliary: No hepatic injury or perihepatic hematoma. Gallbladder is unremarkable Pancreas: Unremarkable. No pancreatic ductal dilatation or surrounding inflammatory changes. Spleen: No splenic injury or perisplenic hematoma. Adrenals/Urinary Tract: No adrenal hemorrhage or renal injury identified. Bladder is unremarkable.  Stomach/Bowel: Small hiatal hernia. Stomach, small-bowel normal. No mesenteric fluid. Diverticula throughout the colon without acute inflammation. Small ventral hernia RIGHT of midline with a single wall of colon extending through the small hernia which measures approximately 1.5 cm (image 73, series 2) Vascular/Lymphatic: Atherosclerotic calcification of the aorta. No evidence of dissection or aneurysm. Reproductive: Uterus normal Other: No free fluid the pelvis. Musculoskeletal: No evidence of pelvic fracture or spine fracture. Multiple compression fractures in the lumbar spine. This augmentation of a compression fracture at T12. No evidence of acute fracture. No paraspinal hematoma. IMPRESSION: Chest Impression: 1. No evidence of thoracic trauma. 2. Coronary artery calcification and aortic atherosclerotic calcification. Abdomen / Pelvis Impression: 1. No evidence of abdominal or pelvic trauma. 2. Small ventral hernia contains a single wall of transverse colon (so-called Richter's hernia) 3.  Atherosclerotic calcification of the aorta. 4. Multiple compression fractures in  the lower thoracic spine and lumbar spine not changed from comparison MRI. Electronically Signed   By: Suzy Bouchard M.D.   On: 10/21/2015 20:13   Ct L-spine No Charge  Result Date: 10/21/2015 CLINICAL DATA:  Right flank pain after fall today at home. EXAM: CT LUMBAR SPINE WITHOUT CONTRAST TECHNIQUE: Multidetector CT imaging of the lumbar spine was performed without intravenous contrast administration. Multiplanar CT image reconstructions were also generated. COMPARISON:  MRI of May 02, 2014. FINDINGS: Segmentation: 5 non rib-bearing lumbar type vertebral bodies are noted. Alignment: Mild grade 1 retrolisthesis of L1-2 is noted secondary to degenerative disc disease at this level. Vertebrae: Status post kyphoplasty of T12. Superior and inferior endplate depression of L3 is noted which is unchanged. No acute fracture is noted. Paraspinal and other soft tissues: No abnormality seen. Disc levels: Moderate degenerative disc disease is noted at L1-2 with anterior osteophyte formation. Severe degenerative disc disease is noted at L4-5 and L5-S1. IMPRESSION: Multilevel degenerative disc disease. Status post kyphoplasty T12. Status post old L3 vertebral body fracture. No acute fracture is noted. Electronically Signed   By: Marijo Conception, M.D.   On: 10/21/2015 20:12    Procedures Procedures (including critical care time)  Medications Ordered in ED Medications  fentaNYL (SUBLIMAZE) injection 25 mcg (25 mcg Intravenous Given 10/21/15 1843)  ondansetron (ZOFRAN) injection 4 mg (4 mg Intravenous Given 10/21/15 1843)     Initial Impression / Assessment and Plan / ED Course  I have reviewed the triage vital signs and the nursing notes.  Pertinent labs & imaging results that were available during my care of the patient were reviewed by me and considered in my medical decision making (see chart for details).  Clinical Course    82 year old female with a history of MI, hypertension, compression fracture  of T12, CHF presents with concern for mechanical fall from standing with severe flank pain. Patient reports that she was pulling vines in the garden, when she fell backwards and hit her head. CT head shows no acute abnormalities. Given significant tenderness over her right side and right abdomen, CT chest abdomen pelvis were ordered which showed no acute rib fractures or signs of bleed. No sign of new or worsening thoracic or lumbar spine fractures. Labs were obtained given her abdominal pain, which showed no acute changes from prior. Urinalysis does show signs of UTI.  Given patient with fall, right flank pain, will treat for UTI with keflex. Patient discharged in stable condition with understanding of reasons to return.   Final Clinical Impressions(s) / ED Diagnoses   Final diagnoses:  Fall  Fall,  initial encounter  Right flank pain  Urinary tract infection without hematuria, site unspecified    New Prescriptions Discharge Medication List as of 10/21/2015  9:06 PM    START taking these medications   Details  cyclobenzaprine (FLEXERIL) 5 MG tablet Take 1-2 tablets (5-10 mg total) by mouth 2 (two) times daily as needed for muscle spasms., Starting Sat 10/21/2015, Print         Gareth Morgan, MD 10/23/15 [id number]

## 2015-10-27 ENCOUNTER — Encounter: Payer: Self-pay | Admitting: Internal Medicine

## 2015-10-27 ENCOUNTER — Ambulatory Visit (INDEPENDENT_AMBULATORY_CARE_PROVIDER_SITE_OTHER): Payer: Medicare Other | Admitting: Internal Medicine

## 2015-10-27 DIAGNOSIS — R6889 Other general symptoms and signs: Secondary | ICD-10-CM

## 2015-10-27 DIAGNOSIS — I6523 Occlusion and stenosis of bilateral carotid arteries: Secondary | ICD-10-CM | POA: Diagnosis not present

## 2015-10-27 DIAGNOSIS — Z7409 Other reduced mobility: Secondary | ICD-10-CM

## 2015-10-27 DIAGNOSIS — I10 Essential (primary) hypertension: Secondary | ICD-10-CM | POA: Diagnosis not present

## 2015-10-27 DIAGNOSIS — M5440 Lumbago with sciatica, unspecified side: Secondary | ICD-10-CM

## 2015-10-27 DIAGNOSIS — M810 Age-related osteoporosis without current pathological fracture: Secondary | ICD-10-CM

## 2015-10-27 DIAGNOSIS — E559 Vitamin D deficiency, unspecified: Secondary | ICD-10-CM

## 2015-10-27 DIAGNOSIS — W1800XA Striking against unspecified object with subsequent fall, initial encounter: Secondary | ICD-10-CM

## 2015-10-27 DIAGNOSIS — G8929 Other chronic pain: Secondary | ICD-10-CM

## 2015-10-27 DIAGNOSIS — F411 Generalized anxiety disorder: Secondary | ICD-10-CM

## 2015-10-27 DIAGNOSIS — K219 Gastro-esophageal reflux disease without esophagitis: Secondary | ICD-10-CM

## 2015-10-27 MED ORDER — PANTOPRAZOLE SODIUM 40 MG PO TBEC: 40.0000 mg | DELAYED_RELEASE_TABLET | Freq: Every day | ORAL | 3 refills | Status: DC | PRN

## 2015-10-27 MED ORDER — POLYETHYLENE GLYCOL 3350 17 GM/SCOOP PO POWD: 17.0000 g | Freq: Two times a day (BID) | ORAL | 11 refills | Status: DC | PRN

## 2015-10-27 NOTE — Assessment & Plan Note (Signed)
10/21/15 - fall in the yard: no fractures

## 2015-10-27 NOTE — Assessment & Plan Note (Signed)
On Vit D 

## 2015-10-27 NOTE — Assessment & Plan Note (Signed)
Protonix prn

## 2015-10-27 NOTE — Assessment & Plan Note (Signed)
Lorazepam prn; Lexapro  Potential benefits of a long term benzodiazepines  use as well as potential risks  and complications were explained to the patient and were aknowledged.

## 2015-10-27 NOTE — Assessment & Plan Note (Signed)
Lopressor, Furosemide

## 2015-10-27 NOTE — Assessment & Plan Note (Signed)
Options discussed Prolia advised Vit D

## 2015-10-27 NOTE — Assessment & Plan Note (Signed)
10/21/15 - fall in the yard. ER visit note, tests were reviewed

## 2015-10-27 NOTE — Progress Notes (Signed)
Pre visit review using our clinic review tool, if applicable. No additional management support is needed unless otherwise documented below in the visit note. 

## 2015-10-27 NOTE — Progress Notes (Signed)
Subjective:  Patient ID: [name] [name], female    DOB: 1931-03-10  Age: 82 y.o. MRN: [id number]  CC: No chief complaint on file.   HPI   [name] [name] presents for ER f/u for a fall in the yard on 10/21/15: she was pulling vines in the garden, when she fell backwards and hit her head. CT head shows no acute abnormalities. Given significant tenderness over her right side and right abdomen, CT chest abdomen pelvis were ordered which showed no acute rib fractures or signs of bleed. No sign of new or worsening thoracic or lumbar spine fractures. Labs were obtained given her abdominal pain, which showed no acute changes from prior. Urinalysis does show signs of UTI. Given patient with fall, right flank pain, will treat for UTI with keflex.   F/u GERD, depression, anxiety  Outpatient Medications Prior to Visit  Medication Sig Dispense Refill  . acetaminophen (TYLENOL) 650 MG CR tablet Take 325 mg by mouth every 8 (eight) hours as needed for pain (arthritis).     [name] albuterol (PROVENTIL HFA;VENTOLIN HFA) 108 (90 Base) MCG/ACT inhaler Inhale 1-2 puffs into the lungs every 6 (six) hours as needed for wheezing or shortness of breath. 1 Inhaler 1  . aspirin 325 MG EC tablet Take 325 mg by mouth daily.    [name] atorvastatin (LIPITOR) 40 MG tablet Take 40 mg by mouth every other day.    . b complex vitamins capsule Take 1 capsule by mouth daily.    . Biotin 5000 MCG CAPS Take 1 capsule by mouth daily.    . cephALEXin (KEFLEX) 500 MG capsule Take 1 capsule (500 mg total) by mouth 3 (three) times daily. 21 capsule 0  . Cholecalciferol (EQL VITAMIN D3) 1000 UNITS tablet Take 1 tablet (1,000 Units total) by mouth daily. 100 tablet 3  . cyclobenzaprine (FLEXERIL) 5 MG tablet Take 1-2 tablets (5-10 mg total) by mouth 2 (two) times daily as needed for muscle spasms. 20 tablet 0  . darbepoetin (ARANESP) 100 MCG/0.5ML SOLN injection Inject 100 mcg into the skin every 8 (eight) weeks.    . furosemide (LASIX) 20 MG  tablet TAKE 1 TABLET BY MOUTH DAILY (Patient taking differently: Take 0.5 tablet by mouth BID) 30 tablet 5  . levothyroxine (SYNTHROID, LEVOTHROID) 50 MCG tablet TAKE 1 TABLET BY MOUTH EVERY DAY BEFORE BREAKFAST 90 tablet 3  . LORazepam (ATIVAN) 0.5 MG tablet Take 1 tablet (0.5 mg total) by mouth every 6 (six) hours as needed. for anxiety 30 tablet 3  . metoprolol tartrate (LOPRESSOR) 25 MG tablet Take 0.5 tablets (12.5 mg total) by mouth 2 (two) times daily. 90 tablet 1  . sennosides-docusate sodium (SENOKOT-S) 8.6-50 MG tablet Take 1 tablet by mouth daily.     [name] escitalopram (LEXAPRO) 5 MG tablet Take 1 tablet (5 mg total) by mouth daily. (Patient not taking: Reported on 10/27/2015) 30 tablet 5  . pantoprazole (PROTONIX) 40 MG tablet TAKE 1 TABLET BY MOUTH DAILY (Patient not taking: Reported on 10/27/2015) 90 tablet 3   Facility-Administered Medications Prior to Visit  Medication Dose Route Frequency Provider Last Rate Last Dose  . Darbepoetin Alfa (ARANESP) injection 500 mcg  500 mcg Subcutaneous Once [name], MD        ROS Review of Systems  Constitutional: Positive for fatigue. Negative for activity change, appetite change, chills and unexpected weight change.  HENT: Negative for congestion, mouth sores and sinus pressure.   Eyes: Negative for visual disturbance.  Respiratory:  Negative for cough and chest tightness.   Gastrointestinal: Negative for abdominal pain and nausea.  Genitourinary: Negative for difficulty urinating, frequency and vaginal pain.  Musculoskeletal: Positive for gait problem. Negative for arthralgias and back pain.  Skin: Negative for pallor and rash.  Neurological: Positive for dizziness and weakness. Negative for tremors, numbness and headaches.  Hematological: Does not bruise/bleed easily.  Psychiatric/Behavioral: Negative for confusion and sleep disturbance. The patient is not nervous/anxious.     Objective:  BP 132/80   Pulse 76   Wt 166 lb (75.3 kg)    SpO2 96%   BMI 29.41 kg/m   BP Readings from Last 3 Encounters:  10/27/15 132/80  10/21/15 153/71  10/09/15 (!) 160/88    Wt Readings from Last 3 Encounters:  10/27/15 166 lb (75.3 kg)  10/09/15 166 lb (75.3 kg)  09/13/15 167 lb (75.8 kg)    Physical Exam  Constitutional: She appears well-developed. No distress.  HENT:  Head: Normocephalic.  Right Ear: External ear normal.  Left Ear: External ear normal.  Nose: Nose normal.  Mouth/Throat: Oropharynx is clear and moist.  Eyes: Conjunctivae are normal. Pupils are equal, round, and reactive to light. Right eye exhibits no discharge. Left eye exhibits no discharge.  Neck: Normal range of motion. Neck supple. No JVD present. No tracheal deviation present. No thyromegaly present.  Cardiovascular: Normal rate, regular rhythm and normal heart sounds.   Pulmonary/Chest: No stridor. No respiratory distress. She has no wheezes.  Abdominal: Soft. Bowel sounds are normal. She exhibits no distension and no mass. There is no tenderness. There is no rebound and no guarding.  Musculoskeletal: She exhibits tenderness. She exhibits no edema.  Lymphadenopathy:    She has no cervical adenopathy.  Neurological: She displays normal reflexes. No cranial nerve deficit. She exhibits normal muscle tone. Coordination abnormal.  Skin: No rash noted. No erythema.  Psychiatric: She has a normal mood and affect. Her behavior is normal. Judgment and thought content normal.  Walker Ataxic  Lab Results  Component Value Date   WBC 11.5 (H) 10/21/2015   HGB 11.5 (L) 10/21/2015   HCT 33.8 (L) 10/21/2015   PLT 374 10/21/2015   GLUCOSE 98 10/21/2015   CHOL 154 06/07/2015   TRIG 96.0 06/07/2015   HDL 47.60 06/07/2015   LDLCALC 87 06/07/2015   ALT 17 10/21/2015   AST 31 10/21/2015   NA 135 10/21/2015   K 3.5 10/21/2015   CL 98 (L) 10/21/2015   CREATININE 1.89 (H) 10/21/2015   BUN 37 (H) 10/21/2015   CO2 27 10/21/2015   TSH 0.60 06/07/2015   INR 0.98  05/03/2014    Ct Abdomen Pelvis Wo Contrast  Result Date: 10/21/2015 CLINICAL DATA:  Per EMS, pt from home. Pt was pulling weeds in garden today and [place] [address] backwards and now c/o rt flank pain. No LOC or head injury. Pt is ambulatory. Alert and oriented. She is also saying that the hernia in her stomach is getting bigger. EXAM: CT CHEST, ABDOMEN AND PELVIS WITHOUT CONTRAST TECHNIQUE: Multidetector CT imaging of the chest, abdomen and pelvis was performed following the standard protocol without IV contrast. COMPARISON:  Lumbar MRI 05/02/2014. FINDINGS: CT CHEST FINDINGS Cardiovascular: No contour abnormality of the aorta to suggest dissection. No mediastinal hematoma. Noncontrast exam. Coronary artery calcification and aortic atherosclerotic calcification. No pericardial fluid Mediastinum/Nodes: No mediastinal fluid. Esophagus normal. Midline sternotomy. Lungs/Pleura: No pneumothorax, pulmonary contusion, or pleural fluid. Mild basilar atelectasis. Musculoskeletal: No evidence of rib fracture, scapular fracture, or  clavicle fracture. CT ABDOMEN PELVIS FINDINGS Hepatobiliary: No hepatic injury or perihepatic hematoma. Gallbladder is unremarkable Pancreas: Unremarkable. No pancreatic ductal dilatation or surrounding inflammatory changes. Spleen: No splenic injury or perisplenic hematoma. Adrenals/Urinary Tract: No adrenal hemorrhage or renal injury identified. Bladder is unremarkable. Stomach/Bowel: Small hiatal hernia. Stomach, small-bowel normal. No mesenteric fluid. Diverticula throughout the colon without acute inflammation. Small ventral hernia RIGHT of midline with a single wall of colon extending through the small hernia which measures approximately 1.5 cm (image 73, series 2) Vascular/Lymphatic: Atherosclerotic calcification of the aorta. No evidence of dissection or aneurysm. Reproductive: Uterus normal Other: No free fluid the pelvis. Musculoskeletal: No evidence of pelvic fracture or spine fracture.  Multiple compression fractures in the lumbar spine. This augmentation of a compression fracture at T12. No evidence of acute fracture. No paraspinal hematoma. IMPRESSION: Chest Impression: 1. No evidence of thoracic trauma. 2. Coronary artery calcification and aortic atherosclerotic calcification. Abdomen / Pelvis Impression: 1. No evidence of abdominal or pelvic trauma. 2. Small ventral hernia contains a single wall of transverse colon (so-called Richter's hernia) 3.  Atherosclerotic calcification of the aorta. 4. Multiple compression fractures in the lower thoracic spine and lumbar spine not changed from comparison MRI. Electronically Signed   By: Suzy Bouchard M.D.   On: 10/21/2015 20:13   Ct Head Wo Contrast  Result Date: 10/21/2015 CLINICAL DATA:  Status post fall, head trauma EXAM: CT HEAD WITHOUT CONTRAST TECHNIQUE: Contiguous axial images were obtained from the base of the skull through the vertex without intravenous contrast. COMPARISON:  06/01/2011 FINDINGS: Brain: No evidence of acute infarction, hemorrhage, extra-axial collection, ventriculomegaly, or mass effect. Generalized cerebral atrophy. Periventricular white matter low attenuation likely secondary to microangiopathy. Vascular: Cerebrovascular atherosclerotic calcifications are noted. Skull: Negative for fracture or focal lesion. Sinuses/Orbits: Visualized portions of the orbits are unremarkable. Visualized paranasal sinuses are clear. Other: None. IMPRESSION: 1. No acute intracranial pathology. 2. Chronic microvascular disease and cerebral atrophy. Electronically Signed   By: Kathreen Devoid   On: 10/21/2015 20:14   Ct Chest Wo Contrast  Result Date: 10/21/2015 CLINICAL DATA:  Per EMS, pt from home. Pt was pulling weeds in garden today and [place] [address] backwards and now c/o rt flank pain. No LOC or head injury. Pt is ambulatory. Alert and oriented. She is also saying that the hernia in her stomach is getting bigger. EXAM: CT CHEST, ABDOMEN AND  PELVIS WITHOUT CONTRAST TECHNIQUE: Multidetector CT imaging of the chest, abdomen and pelvis was performed following the standard protocol without IV contrast. COMPARISON:  Lumbar MRI 05/02/2014. FINDINGS: CT CHEST FINDINGS Cardiovascular: No contour abnormality of the aorta to suggest dissection. No mediastinal hematoma. Noncontrast exam. Coronary artery calcification and aortic atherosclerotic calcification. No pericardial fluid Mediastinum/Nodes: No mediastinal fluid. Esophagus normal. Midline sternotomy. Lungs/Pleura: No pneumothorax, pulmonary contusion, or pleural fluid. Mild basilar atelectasis. Musculoskeletal: No evidence of rib fracture, scapular fracture, or clavicle fracture. CT ABDOMEN PELVIS FINDINGS Hepatobiliary: No hepatic injury or perihepatic hematoma. Gallbladder is unremarkable Pancreas: Unremarkable. No pancreatic ductal dilatation or surrounding inflammatory changes. Spleen: No splenic injury or perisplenic hematoma. Adrenals/Urinary Tract: No adrenal hemorrhage or renal injury identified. Bladder is unremarkable. Stomach/Bowel: Small hiatal hernia. Stomach, small-bowel normal. No mesenteric fluid. Diverticula throughout the colon without acute inflammation. Small ventral hernia RIGHT of midline with a single wall of colon extending through the small hernia which measures approximately 1.5 cm (image 73, series 2) Vascular/Lymphatic: Atherosclerotic calcification of the aorta. No evidence of dissection or aneurysm. Reproductive: Uterus normal Other: No  free fluid the pelvis. Musculoskeletal: No evidence of pelvic fracture or spine fracture. Multiple compression fractures in the lumbar spine. This augmentation of a compression fracture at T12. No evidence of acute fracture. No paraspinal hematoma. IMPRESSION: Chest Impression: 1. No evidence of thoracic trauma. 2. Coronary artery calcification and aortic atherosclerotic calcification. Abdomen / Pelvis Impression: 1. No evidence of abdominal or  pelvic trauma. 2. Small ventral hernia contains a single wall of transverse colon (so-called Richter's hernia) 3.  Atherosclerotic calcification of the aorta. 4. Multiple compression fractures in the lower thoracic spine and lumbar spine not changed from comparison MRI. Electronically Signed   By: Suzy Bouchard M.D.   On: 10/21/2015 20:13   Ct L-spine No Charge  Result Date: 10/21/2015 CLINICAL DATA:  Right flank pain after fall today at home. EXAM: CT LUMBAR SPINE WITHOUT CONTRAST TECHNIQUE: Multidetector CT imaging of the lumbar spine was performed without intravenous contrast administration. Multiplanar CT image reconstructions were also generated. COMPARISON:  MRI of May 02, 2014. FINDINGS: Segmentation: 5 non rib-bearing lumbar type vertebral bodies are noted. Alignment: Mild grade 1 retrolisthesis of L1-2 is noted secondary to degenerative disc disease at this level. Vertebrae: Status post kyphoplasty of T12. Superior and inferior endplate depression of L3 is noted which is unchanged. No acute fracture is noted. Paraspinal and other soft tissues: No abnormality seen. Disc levels: Moderate degenerative disc disease is noted at L1-2 with anterior osteophyte formation. Severe degenerative disc disease is noted at L4-5 and L5-S1. IMPRESSION: Multilevel degenerative disc disease. Status post kyphoplasty T12. Status post old L3 vertebral body fracture. No acute fracture is noted. Electronically Signed   By: Marijo Conception, M.D.   On: 10/21/2015 20:12    Assessment & Plan:   There are no diagnoses linked to this encounter. I am having Ms. Almario maintain her aspirin, sennosides-docusate sodium, acetaminophen, b complex vitamins, Biotin, darbepoetin, Cholecalciferol, metoprolol tartrate, furosemide, albuterol, pantoprazole, levothyroxine, escitalopram, atorvastatin, LORazepam, cyclobenzaprine, and cephALEXin.  No orders of the defined types were placed in this encounter.    Follow-up: No Follow-up  on file.  [name], MD

## 2015-11-01 ENCOUNTER — Telehealth: Payer: Self-pay | Admitting: Hematology

## 2015-11-01 NOTE — Telephone Encounter (Signed)
10/02 Appointment rescheduled to 10/12 per patient request. The patient missed her 10/02 appointment per being hospitalized from falling.

## 2015-11-02 ENCOUNTER — Other Ambulatory Visit (HOSPITAL_BASED_OUTPATIENT_CLINIC_OR_DEPARTMENT_OTHER): Payer: Medicare Other

## 2015-11-02 ENCOUNTER — Ambulatory Visit (HOSPITAL_BASED_OUTPATIENT_CLINIC_OR_DEPARTMENT_OTHER): Payer: Medicare Other

## 2015-11-02 VITALS — BP 150/68 | HR 79 | Temp 98.4°F | Resp 20

## 2015-11-02 DIAGNOSIS — N184 Chronic kidney disease, stage 4 (severe): Secondary | ICD-10-CM

## 2015-11-02 DIAGNOSIS — D631 Anemia in chronic kidney disease: Secondary | ICD-10-CM

## 2015-11-02 DIAGNOSIS — N189 Chronic kidney disease, unspecified: Principal | ICD-10-CM

## 2015-11-02 LAB — CBC WITH DIFFERENTIAL/PLATELET
BASO%: 0.7 % (ref 0.0–2.0)
BASOS ABS: 0 10*3/uL (ref 0.0–0.1)
EOS ABS: 0.3 10*3/uL (ref 0.0–0.5)
EOS%: 4.5 % (ref 0.0–7.0)
HEMATOCRIT: 31.2 % — AB (ref 34.8–46.6)
HEMOGLOBIN: 10.4 g/dL — AB (ref 11.6–15.9)
LYMPH%: 32.1 % (ref 14.0–49.7)
MCH: 30.1 pg (ref 25.1–34.0)
MCHC: 33.3 g/dL (ref 31.5–36.0)
MCV: 90.4 fL (ref 79.5–101.0)
MONO#: 0.4 10*3/uL (ref 0.1–0.9)
MONO%: 7.4 % (ref 0.0–14.0)
NEUT#: 3.1 10*3/uL (ref 1.5–6.5)
NEUT%: 55.3 % (ref 38.4–76.8)
Platelets: 321 10*3/uL (ref 145–400)
RBC: 3.45 10*6/uL — ABNORMAL LOW (ref 3.70–5.45)
RDW: 13.7 % (ref 11.2–14.5)
WBC: 5.5 10*3/uL (ref 3.9–10.3)
lymph#: 1.8 10*3/uL (ref 0.9–3.3)

## 2015-11-02 MED ORDER — DARBEPOETIN ALFA 200 MCG/0.4ML IJ SOSY
200.0000 ug | PREFILLED_SYRINGE | Freq: Once | INTRAMUSCULAR | Status: DC
  Filled 2015-11-02: qty 0.4

## 2015-11-02 MED ORDER — DARBEPOETIN ALFA-POLYSORBATE 500 MCG/ML IJ SOLN
200.0000 ug | Freq: Once | INTRAMUSCULAR | Status: DC
  Administered 2015-11-02: 200 ug via SUBCUTANEOUS

## 2015-11-02 NOTE — Patient Instructions (Signed)
Darbepoetin Alfa injection What is this medicine? DARBEPOETIN ALFA (dar be POE e tin AL fa) helps your body make more red blood cells. It is used to treat anemia caused by chronic kidney failure and chemotherapy. This medicine may be used for other purposes; ask your health care provider or pharmacist if you have questions. What should I tell my health care provider before I take this medicine? They need to know if you have any of these conditions: -blood clotting disorders or history of blood clots -cancer patient not on chemotherapy -cystic fibrosis -heart disease, such as angina, heart failure, or a history of a heart attack -hemoglobin level of 12 g/dL or greater -high blood pressure -low levels of folate, iron, or vitamin B12 -seizures -an unusual or allergic reaction to darbepoetin, erythropoietin, albumin, hamster proteins, latex, other medicines, foods, dyes, or preservatives -pregnant or trying to get pregnant -breast-feeding How should I use this medicine? This medicine is for injection into a vein or under the skin. It is usually given by a health care professional in a hospital or clinic setting. If you get this medicine at home, you will be taught how to prepare and give this medicine. Do not shake the solution before you withdraw a dose. Use exactly as directed. Take your medicine at regular intervals. Do not take your medicine more often than directed. It is important that you put your used needles and syringes in a special sharps container. Do not put them in a trash can. If you do not have a sharps container, call your pharmacist or healthcare provider to get one. Talk to your pediatrician regarding the use of this medicine in children. While this medicine may be used in children as young as 1 year for selected conditions, precautions do apply. Overdosage: If you think you have taken too much of this medicine contact a poison control center or emergency room at once. NOTE:  This medicine is only for you. Do not share this medicine with others. What if I miss a dose? If you miss a dose, take it as soon as you can. If it is almost time for your next dose, take only that dose. Do not take double or extra doses. What may interact with this medicine? Do not take this medicine with any of the following medications: -epoetin alfa This list may not describe all possible interactions. Give your health care provider a list of all the medicines, herbs, non-prescription drugs, or dietary supplements you use. Also tell them if you smoke, drink alcohol, or use illegal drugs. Some items may interact with your medicine. What should I watch for while using this medicine? Visit your prescriber or health care professional for regular checks on your progress and for the needed blood tests and blood pressure measurements. It is especially important for the doctor to make sure your hemoglobin level is in the desired range, to limit the risk of potential side effects and to give you the best benefit. Keep all appointments for any recommended tests. Check your blood pressure as directed. Ask your doctor what your blood pressure should be and when you should contact him or her. As your body makes more red blood cells, you may need to take iron, folic acid, or vitamin B supplements. Ask your doctor or health care provider which products are right for you. If you have kidney disease continue dietary restrictions, even though this medication can make you feel better. Talk with your doctor or health care professional about the   foods you eat and the vitamins that you take. What side effects may I notice from receiving this medicine? Side effects that you should report to your doctor or health care professional as soon as possible: -allergic reactions like skin rash, itching or hives, swelling of the face, lips, or tongue -breathing problems -changes in vision -chest pain -confusion, trouble speaking  or understanding -feeling faint or lightheaded, falls -high blood pressure -muscle aches or pains -pain, swelling, warmth in the leg -rapid weight gain -severe headaches -sudden numbness or weakness of the face, arm or leg -trouble walking, dizziness, loss of balance or coordination -seizures (convulsions) -swelling of the ankles, feet, hands -unusually weak or tired Side effects that usually do not require medical attention (report to your doctor or health care professional if they continue or are bothersome): -diarrhea -fever, chills (flu-like symptoms) -headaches -nausea, vomiting -redness, stinging, or swelling at site where injected This list may not describe all possible side effects. Call your doctor for medical advice about side effects. You may report side effects to FDA at 1-800-FDA-1088. Where should I keep my medicine? Keep out of the reach of children. Store in a refrigerator between 2 and 8 degrees C (36 and 46 degrees F). Do not freeze. Do not shake. Throw away any unused portion if using a single-dose vial. Throw away any unused medicine after the expiration date. NOTE: This sheet is a summary. It may not cover all possible information. If you have questions about this medicine, talk to your doctor, pharmacist, or health care provider.    2016, Elsevier/[employer]. (2007-12-22 10:23:57)  

## 2015-11-12 ENCOUNTER — Other Ambulatory Visit: Payer: Self-pay | Admitting: Cardiovascular Disease

## 2015-12-01 ENCOUNTER — Other Ambulatory Visit: Payer: Self-pay | Admitting: Cardiovascular Disease

## 2015-12-13 NOTE — Progress Notes (Signed)
[name] DATE OF VISIT: 12/18/2015   [name], MD [place] [address]  DIAGNOSIS: CKD (chronic kidney disease) stage 4, GFR 15-29 ml/min (HCC)  Anemia in chronic kidney disease, unspecified CKD stage  Chief Complaint  Patient presents with  . Follow-up  . Anemia    CURRENT THERAPY: Aranesp 200 mcg subcutaneous every 3 months for hemoglobin less than 11.   INTERVAL HISTORY: [name] [name] 82 y.o. female with a history of anemia secondary to renal insufficiency is here for follow-up. She has not had any trouble with her injections. She is tired sometimes and is active at times. She fell about a month ago while doing yard work. When she fell she hit her head first and was unable to focus. She uses a walker to ambulate and wears a lifeline. She lives by herself. One daughter lives in [place] and the other lives in [place]. She does not check her blood pressure regularly. She denies dizziness.    MEDICAL HISTORY: Past Medical History:  Diagnosis Date  . Anemia, unspecified   . Anxiety state, unspecified   . Depressive disorder, not elsewhere classified   . Diverticulosis of colon (without mention of hemorrhage)   . Diverticulosis of colon with hemorrhage   . Esophageal reflux   . Lumbago   . Myocardial infarction   . Other and unspecified hyperlipidemia   . Persistent disorder of initiating or maintaining sleep   . Thyroid disease   . Unspecified asthma(493.90)   . Unspecified disorder resulting from impaired renal function   . Unspecified essential hypertension    Dr [name]  . Unspecified osteomyelitis, site unspecified   . Unspecified vitamin D deficiency    INTERIM HISTORY: has Vitamin D deficiency; Dyslipidemia; DEHYDRATION (VOLUME DEPLETION); Anxiety state; INSOMNIA, PERSISTENT; Essential hypertension; Coronary atherosclerosis; Bilateral carotid bruits; PERIPHERAL VASCULAR DISEASE; BRONCHITIS NOT  SPECIFIED AS ACUTE OR CHRONIC; Asthma; GERD; DIVERTICULOSIS, COLON; Diverticulosis of colon with hemorrhage; PANCREATITIS; Disorder resulting from impaired renal function; PYELONEPHRITIS; UNSPECIFIED DISORDER OF URETHRA&URINARY TRACT; LOW BACK PAIN; OSTEOMYELITIS; SYNCOPE; Fatigue; Anemia in chronic renal disease; Cramps, extremity; Nausea alone; Unspecified constipation; Unspecified hypothyroidism; Cough; A-fib ([place]); CHF (congestive heart failure) ([place]); PNA (pneumonia); Thyroid nodule; Influenza due to influenza A virus; Minimal cognitive impairment; Neuropathy ([place]); Acute non-ST segment elevation myocardial infarction [facility]); Disuse syndrome; Restless leg; Left leg weakness; Fall against object; Left ankle pain; Primary localized osteoarthrosis, lower leg; Alopecia; Pes anserine bursitis; Situational depression; Decreased ambulation status; Left-sided low back pain with sciatica; CKD (chronic kidney disease) stage 4, GFR 15-29 ml/min ([place]); Back pain; Compression fracture of T12 vertebra with delayed healing; Poor tolerance for ambulation; Osteoporosis; Piriformis syndrome of left side; Anemia; Gout; and Neck pain, musculoskeletal on her problem list.    ALLERGIES:  is allergic to codeine sulfate; darvon [propoxyphene]; propoxyphene napsylate; sulfacetamide sodium-sulfur; sulfamethoxazole-trimethoprim; and zolpidem.  MEDICATIONS: has a current medication list which includes the following prescription(s): acetaminophen, albuterol, aspirin, atorvastatin, b complex vitamins, biotin, cholecalciferol, darbepoetin, escitalopram, furosemide, levothyroxine, metoprolol tartrate, pantoprazole, sennosides-docusate sodium, cyclobenzaprine, and polyethylene glycol powder, and the following Facility-Administered Medications: darbepoetin alfa and darbepoetin alfa.  SURGICAL HISTORY:  Past Surgical History:  Procedure Laterality Date  . BONE MARROW BIOPSY    . CORONARY ARTERY BYPASS GRAFT  03/25/13   x4. CABS ON  PUMP-Surgeon Jomarie Longs.  [name] PARTIAL COLECTOMY  1995   diverticulosis of colon iwth hemorrhage.   . status post right carotid enterectomy  REVIEW OF SYSTEMS:   Constitutional: Denies fevers, chills (+) mild fatigue Eyes: Denies blurriness of vision Ears, nose, mouth, throat, and face: Denies mucositis or sore throat Respiratory: Denies cough, dyspnea or wheezes Cardiovascular: Denies palpitation, chest discomfort or lower extremity swelling Gastrointestinal:  Denies nausea, heartburn or change in bowel habits Skin: Denies abnormal skin rashes Lymphatics: Denies new lymphadenopathy or easy bruising Neurological:Denies numbness, tingling or new weaknesses Behavioral/Psych: Mood is stable, no new changes  All other systems were reviewed with the patient and are negative.  PHYSICAL EXAMINATION: ECOG PERFORMANCE STATUS: 1  Blood pressure (!) 178/77, pulse 66, temperature 98.6 F (37 C), temperature source Oral, resp. rate 17, height _0  (1.6 m), weight 167 lb 4.8 oz (75.9 kg), SpO2 98 %.   GENERAL:alert, no distress and comfortable; elderly female who is well developed, well nourished.  SKIN: skin color, texture, turgor are normal, no rashes or significant lesions; scar on chest well healed.  EYES: normal, Conjunctiva are pink and non-injected, sclera clear OROPHARYNX:no exudate, no erythema and lips, buccal mucosa, and tongue normal  NECK: supple, thyroid normal size, non-tender, without nodularity LYMPH:  no palpable lymphadenopathy in the cervical, axillary or supraclavicular LUNGS: clear to auscultation and percussion with normal breathing effort HEART: regular rate & rhythm and no murmurs and no lower extremity edema ABDOMEN:abdomen soft, non-tender and normal bowel sounds Musculoskeletal:no cyanosis of digits and no clubbing  NEURO: alert & oriented x 3 with fluent speech, no focal motor/sensory deficits  LABORATORY DATA: CBC Latest Ref Rng & Units 12/18/2015 11/02/2015  10/21/2015  WBC 3.9 - 10.3 10e3/uL 7.2 5.5 11.5(H)  Hemoglobin 11.6 - 15.9 g/dL 10.6(L) 10.4(L) 11.5(L)  Hematocrit 34.8 - 46.6 % 32.1(L) 31.2(L) 33.8(L)  Platelets 145 - 400 10e3/uL 354 321 374    CMP Latest Ref Rng & Units 10/21/2015 06/07/2015 11/23/2014  Glucose 65 - 99 mg/dL 98 97 90  BUN 6 - 20 mg/dL 37(H) 34(H) 35(H)  Creatinine 0.44 - 1.00 mg/dL 1.89(H) 1.78(H) 1.89(H)  Sodium 135 - 145 mmol/L 135 139 141  Potassium 3.5 - 5.1 mmol/L 3.5 4.0 3.8  Chloride 101 - 111 mmol/L 98(L) 105 102  CO2 22 - 32 mmol/L _1 Calcium 8.9 - 10.3 mg/dL 9.6 9.5 9.4  Total Protein 6.5 - 8.1 g/dL 7.8 7.0 7.0  Total Bilirubin 0.3 - 1.2 mg/dL 1.1 0.6 0.6  Alkaline Phos 38 - 126 U/L 86 92 105  AST 15 - 41 U/L _2 ALT 14 - 54 U/L _3 Results for CHELLE, CAYTON (MRN [id number]) as of 12/18/2015 13:08  Ref. Range 12/29/2014 10:36 06/07/2015 12:39  Iron Latest Ref Range: 42 - 145 ug/dL 77 75  UIBC Latest Ref Range: 120 - 384 ug/dL 180   TIBC Latest Ref Range: 236 - 444 ug/dL 258   %SAT Latest Ref Range: 21 - 57 % 30   Saturation Ratios Latest Ref Range: 20.0 - 50.0 %  23.0  Ferritin Latest Ref Range: 9 - 269 ng/ml 159   Transferrin Latest Ref Range: 212.0 - 360.0 mg/dL  233.0    Studies:  No results found.   RADIOGRAPHIC STUDY No new study    ASSESSMENT: [name] [name] 82 y.o. female with a history of CKD (chronic kidney disease) stage 4, GFR 15-29 ml/min (HCC)  Anemia in chronic kidney disease, unspecified CKD stage   PLAN:  1. Anemia in chronic renal disease.   -She is clinically doing well,  her hemoglobin today is 10.6 today. She has been able to maintain her hemoglobin well with Aranesp every 3 months. However she complains about fatigue, and would like to go back to Aranesp injection every 2 months.  -We again reviewed the benefit and the risks of Aranesp injection, especially the risk of thrombosis, including heart attack and stroke. She voiced good understanding.   -Her iron study 05/2015 was completely normal. -Due to her uncontrolled hypertension, I will hold Aranesp injection today, and reschedule to 3 weeks  2. Chronic kidney disease.  --Avoid nephrotoxins and counseled on continued adequate hydration.    3. Gout  -I encouraged her to follow-up with her primary care physician, and discussed if she would benefit from allopurinol to prevent gout flare  4. CAD, CHF, HTN, ARTHRITIS  -She will continue follow-up with her primary care physician  5. Hypertension -BP today is 178/77, repeated a blood pressure is still high (SBP in 180's) -She will follow with primary care -Encouraged the patient to check blood pressure regularly at home   Follow-up.   -Due to her uncontrolled hypertension, I will hold Aranesp injection today, and reschedule to 3 weeks --RTC in 6 months with repeat labs. She will continue Aranesp every 2 months  Patient had many questions, especially about her gout today. All questions were answered. The patient knows to call the clinic with any problems, questions or concerns. We can certainly see the patient much sooner if necessary.  I spent 15 minutes counseling the patient face to face. The total time spent in the appointment was 20 minutes.  This document serves as a record of services personally performed by [name], MD. It was created on her behalf by Arlyce Harman, a trained medical scribe. The creation of this record is based on the scribe's personal observations and the provider's statements to them. This document has been checked and approved by the attending provider.   [name]  12/18/2015

## 2015-12-18 ENCOUNTER — Other Ambulatory Visit (HOSPITAL_BASED_OUTPATIENT_CLINIC_OR_DEPARTMENT_OTHER): Payer: Medicare Other

## 2015-12-18 ENCOUNTER — Ambulatory Visit: Payer: Medicare Other

## 2015-12-18 ENCOUNTER — Ambulatory Visit (HOSPITAL_BASED_OUTPATIENT_CLINIC_OR_DEPARTMENT_OTHER): Payer: Medicare Other | Admitting: Hematology

## 2015-12-18 ENCOUNTER — Encounter: Payer: Self-pay | Admitting: Hematology

## 2015-12-18 ENCOUNTER — Telehealth: Payer: Self-pay | Admitting: Hematology

## 2015-12-18 VITALS — BP 178/77 | HR 66 | Temp 98.6°F | Resp 17 | Ht 63.0 in | Wt 167.3 lb

## 2015-12-18 DIAGNOSIS — N189 Chronic kidney disease, unspecified: Principal | ICD-10-CM

## 2015-12-18 DIAGNOSIS — N184 Chronic kidney disease, stage 4 (severe): Secondary | ICD-10-CM

## 2015-12-18 DIAGNOSIS — I10 Essential (primary) hypertension: Secondary | ICD-10-CM | POA: Diagnosis not present

## 2015-12-18 DIAGNOSIS — D631 Anemia in chronic kidney disease: Secondary | ICD-10-CM | POA: Diagnosis not present

## 2015-12-18 LAB — CBC WITH DIFFERENTIAL/PLATELET
BASO%: 1.1 % (ref 0.0–2.0)
BASOS ABS: 0.1 10*3/uL (ref 0.0–0.1)
EOS ABS: 0.3 10*3/uL (ref 0.0–0.5)
EOS%: 3.6 % (ref 0.0–7.0)
HEMATOCRIT: 32.1 % — AB (ref 34.8–46.6)
HGB: 10.6 g/dL — ABNORMAL LOW (ref 11.6–15.9)
LYMPH#: 1.8 10*3/uL (ref 0.9–3.3)
LYMPH%: 24.8 % (ref 14.0–49.7)
MCH: 30.2 pg (ref 25.1–34.0)
MCHC: 33 g/dL (ref 31.5–36.0)
MCV: 91.5 fL (ref 79.5–101.0)
MONO#: 0.7 10*3/uL (ref 0.1–0.9)
MONO%: 9.3 % (ref 0.0–14.0)
NEUT#: 4.4 10*3/uL (ref 1.5–6.5)
NEUT%: 61.2 % (ref 38.4–76.8)
PLATELETS: 354 10*3/uL (ref 145–400)
RBC: 3.51 10*6/uL — ABNORMAL LOW (ref 3.70–5.45)
RDW: 13.7 % (ref 11.2–14.5)
WBC: 7.2 10*3/uL (ref 3.9–10.3)

## 2015-12-18 MED ORDER — DARBEPOETIN ALFA 200 MCG/0.4ML IJ SOSY
200.0000 ug | PREFILLED_SYRINGE | Freq: Once | INTRAMUSCULAR | Status: DC
  Filled 2015-12-18: qty 0.4

## 2015-12-18 MED ORDER — DARBEPOETIN ALFA-POLYSORBATE 500 MCG/ML IJ SOLN: 200.0000 ug | Freq: Once | INTRAMUSCULAR | Status: DC

## 2015-12-18 NOTE — Telephone Encounter (Signed)
Appointments scheduled per 12/18/15 los. A copy of the  AVS report and appointment schedule was given to patient,per 12/18/15 los.  °

## 2015-12-21 ENCOUNTER — Telehealth: Payer: Self-pay | Admitting: *Deleted

## 2015-12-21 NOTE — Telephone Encounter (Signed)
FYI "I am to check my B/P daily but having problem with my machine.  Could a nurse help me with this.  I live alone.  Tuesday I was able to check my b/p but now every time I try it reads ERR.  I do not need to see the doctor, I just need help from someone in the office."   This nurse will assist.  Patient will come to Triage today at 11:00.

## 2015-12-21 NOTE — Telephone Encounter (Signed)
Patient's B/P checked by this nurse and by patient.  She was not inflating cuff high enough above diastolic is why the [facility] message seen.  Denies further questions at this time.

## 2015-12-29 ENCOUNTER — Telehealth: Payer: Self-pay | Admitting: Cardiovascular Disease

## 2015-12-29 MED ORDER — METOPROLOL TARTRATE 25 MG PO TABS: 25.0000 mg | ORAL_TABLET | Freq: Two times a day (BID) | ORAL | 3 refills | Status: DC

## 2015-12-29 MED ORDER — ASPIRIN EC 81 MG PO TBEC: 81.0000 mg | DELAYED_RELEASE_TABLET | Freq: Every day | ORAL | 3 refills | Status: AC

## 2015-12-29 NOTE — Telephone Encounter (Signed)
New message      Pt c/o BP issue: STAT if pt c/o blurred vision, one-sided weakness or slurred speech  1. What are your last 5 BP readings? Dec 5 178/82 HR 75, dec 6 172/76 HR 66, dec 7 182/86 HR 78, today 190/88 HR 78  2. Are you having any other symptoms (ex. Dizziness, headache, blurred vision, passed out)?  Slight headache and fatigue  3. What is your BP issue?  bp is high. Please call today if possible

## 2015-12-29 NOTE — Telephone Encounter (Signed)
Patient called complaining of elevated BP (Dec 5 178/82 HR 75, dec 6 172/76 HR 66, dec 7 182/86 HR 78, today 190/88 HR 78). Patient states she is taking her BP at rest and is taking metoprolol 12.5 mg twice a day. Patient reports a slight headache yesterday but denies any headache or weakness today. Consulted Dr. Tamala Julian (DOD) since Dr. [name] is off and Dr. Tamala Julian recommended patient increase taking the metoprolol 25 mg twice a day. He also wanted to decrease the aspirin from 325 mg  to 81 mg daily. I explained these changes  to the patient and told her to continue to take BP readings and to call back with readings in a week. I also told the patient to let us know if she felt lightheaded or dizzy and told her to make sure that she keeps an eye on her HR making sure that it does not get too low with the increase in metoprolol. Patient verbalized understanding and was appreciative for the call.

## 2016-01-03 ENCOUNTER — Telehealth: Payer: Self-pay | Admitting: Cardiovascular Disease

## 2016-01-03 ENCOUNTER — Other Ambulatory Visit: Payer: Self-pay | Admitting: Cardiovascular Disease

## 2016-01-03 ENCOUNTER — Other Ambulatory Visit: Payer: Self-pay | Admitting: *Deleted

## 2016-01-03 DIAGNOSIS — I6523 Occlusion and stenosis of bilateral carotid arteries: Secondary | ICD-10-CM

## 2016-01-03 MED ORDER — ESCITALOPRAM OXALATE 5 MG PO TABS: 5.0000 mg | ORAL_TABLET | Freq: Every day | ORAL | 5 refills | Status: DC

## 2016-01-03 NOTE — Telephone Encounter (Signed)
F/u Message  Pt call requesting to speak with RN to follow up on telephone message when speaking with B. Pamala Duffel. Please call back to discuss

## 2016-01-03 NOTE — Telephone Encounter (Signed)
Heading in the right direction

## 2016-01-03 NOTE — Telephone Encounter (Signed)
Cleon Gustin, RN      2:49 PM  Note    Patient called complaining of elevated BP (Dec 5 178/82 HR 75, dec 6 172/76 HR 66, dec 7 182/86 HR 78, today 190/88 HR 78). Patient states she is taking her BP at rest and is taking metoprolol 12.5 mg twice a day. Patient reports a slight headache yesterday but denies any headache or weakness today. Consulted Dr. Tamala Julian (DOD) since Dr. [name] is off and Dr. Tamala Julian recommended patient increase taking the metoprolol 25 mg twice a day. He also wanted to decrease the aspirin from 325 mg  to 81 mg daily. I explained these changes  to the patient and told her to continue to take BP readings and to call back with readings in a week. I also told the patient to let us know if she felt lightheaded or dizzy and told her to make sure that she keeps an eye on her HR making sure that it does not get too low with the increase in metoprolol. Patient verbalized understanding and was appreciative for the call.     This is the phone call patient is referring to. Called patient, left message for patient to call back.

## 2016-01-03 NOTE — Telephone Encounter (Signed)
Patient wanted to let Dr. [name] know about her BP readings after the Metoprolol increase last Friday. Will forward to Dr. [name] for further advisement if needed.  12/10 159/80 HR 76 12/11 166/77 HR 71 (AM) and 110/51 HR 74 (PM) 12/12 152/68 HR 78 12/13 143/62 HR 80

## 2016-01-07 ENCOUNTER — Emergency Department (HOSPITAL_COMMUNITY): Payer: Medicare Other

## 2016-01-07 ENCOUNTER — Inpatient Hospital Stay (HOSPITAL_COMMUNITY)
Admission: EM | Admit: 2016-01-07 | Discharge: 2016-01-11 | DRG: 536 | Disposition: A | Discharge status: Skilled Nursing Facility | Payer: Medicare Other | Attending: Internal Medicine | Admitting: Internal Medicine

## 2016-01-07 ENCOUNTER — Encounter (HOSPITAL_COMMUNITY): Payer: Self-pay

## 2016-01-07 DIAGNOSIS — D631 Anemia in chronic kidney disease: Secondary | ICD-10-CM | POA: Diagnosis not present

## 2016-01-07 DIAGNOSIS — F411 Generalized anxiety disorder: Secondary | ICD-10-CM | POA: Diagnosis present

## 2016-01-07 DIAGNOSIS — N390 Urinary tract infection, site not specified: Secondary | ICD-10-CM | POA: Diagnosis present

## 2016-01-07 DIAGNOSIS — I130 Hypertensive heart and chronic kidney disease with heart failure and stage 1 through stage 4 chronic kidney disease, or unspecified chronic kidney disease: Secondary | ICD-10-CM | POA: Diagnosis not present

## 2016-01-07 DIAGNOSIS — S32511A Fracture of superior rim of right pubis, initial encounter for closed fracture: Principal | ICD-10-CM | POA: Diagnosis present

## 2016-01-07 DIAGNOSIS — I6529 Occlusion and stenosis of unspecified carotid artery: Secondary | ICD-10-CM | POA: Diagnosis present

## 2016-01-07 DIAGNOSIS — I5022 Chronic systolic (congestive) heart failure: Secondary | ICD-10-CM | POA: Diagnosis present

## 2016-01-07 DIAGNOSIS — K219 Gastro-esophageal reflux disease without esophagitis: Secondary | ICD-10-CM | POA: Diagnosis present

## 2016-01-07 DIAGNOSIS — Z9181 History of falling: Secondary | ICD-10-CM

## 2016-01-07 DIAGNOSIS — R030 Elevated blood-pressure reading, without diagnosis of hypertension: Secondary | ICD-10-CM | POA: Diagnosis not present

## 2016-01-07 DIAGNOSIS — Z951 Presence of aortocoronary bypass graft: Secondary | ICD-10-CM

## 2016-01-07 DIAGNOSIS — X58XXXA Exposure to other specified factors, initial encounter: Secondary | ICD-10-CM | POA: Diagnosis present

## 2016-01-07 DIAGNOSIS — Z9049 Acquired absence of other specified parts of digestive tract: Secondary | ICD-10-CM

## 2016-01-07 DIAGNOSIS — I2510 Atherosclerotic heart disease of native coronary artery without angina pectoris: Secondary | ICD-10-CM | POA: Diagnosis present

## 2016-01-07 DIAGNOSIS — F329 Major depressive disorder, single episode, unspecified: Secondary | ICD-10-CM | POA: Diagnosis present

## 2016-01-07 DIAGNOSIS — I10 Essential (primary) hypertension: Secondary | ICD-10-CM | POA: Diagnosis present

## 2016-01-07 DIAGNOSIS — Z79899 Other long term (current) drug therapy: Secondary | ICD-10-CM

## 2016-01-07 DIAGNOSIS — E039 Hypothyroidism, unspecified: Secondary | ICD-10-CM | POA: Diagnosis present

## 2016-01-07 DIAGNOSIS — M25551 Pain in right hip: Secondary | ICD-10-CM | POA: Diagnosis not present

## 2016-01-07 DIAGNOSIS — I509 Heart failure, unspecified: Secondary | ICD-10-CM

## 2016-01-07 DIAGNOSIS — R627 Adult failure to thrive: Secondary | ICD-10-CM | POA: Diagnosis present

## 2016-01-07 DIAGNOSIS — E785 Hyperlipidemia, unspecified: Secondary | ICD-10-CM | POA: Diagnosis present

## 2016-01-07 DIAGNOSIS — I480 Paroxysmal atrial fibrillation: Secondary | ICD-10-CM | POA: Diagnosis not present

## 2016-01-07 DIAGNOSIS — N3091 Cystitis, unspecified with hematuria: Secondary | ICD-10-CM | POA: Diagnosis not present

## 2016-01-07 DIAGNOSIS — S32591A Other specified fracture of right pubis, initial encounter for closed fracture: Secondary | ICD-10-CM | POA: Diagnosis not present

## 2016-01-07 DIAGNOSIS — N184 Chronic kidney disease, stage 4 (severe): Secondary | ICD-10-CM | POA: Diagnosis not present

## 2016-01-07 DIAGNOSIS — I4891 Unspecified atrial fibrillation: Secondary | ICD-10-CM | POA: Diagnosis present

## 2016-01-07 DIAGNOSIS — S32599A Other specified fracture of unspecified pubis, initial encounter for closed fracture: Secondary | ICD-10-CM | POA: Diagnosis present

## 2016-01-07 DIAGNOSIS — R52 Pain, unspecified: Secondary | ICD-10-CM

## 2016-01-07 DIAGNOSIS — E860 Dehydration: Secondary | ICD-10-CM | POA: Diagnosis present

## 2016-01-07 DIAGNOSIS — R269 Unspecified abnormalities of gait and mobility: Secondary | ICD-10-CM | POA: Diagnosis not present

## 2016-01-07 DIAGNOSIS — Z7982 Long term (current) use of aspirin: Secondary | ICD-10-CM

## 2016-01-07 DIAGNOSIS — Y92003 Bedroom of unspecified non-institutional (private) residence as the place of occurrence of the external cause: Secondary | ICD-10-CM

## 2016-01-07 DIAGNOSIS — Z7409 Other reduced mobility: Secondary | ICD-10-CM

## 2016-01-07 DIAGNOSIS — M810 Age-related osteoporosis without current pathological fracture: Secondary | ICD-10-CM | POA: Diagnosis present

## 2016-01-07 DIAGNOSIS — N189 Chronic kidney disease, unspecified: Secondary | ICD-10-CM

## 2016-01-07 DIAGNOSIS — S3992XA Unspecified injury of lower back, initial encounter: Secondary | ICD-10-CM | POA: Diagnosis not present

## 2016-01-07 DIAGNOSIS — I252 Old myocardial infarction: Secondary | ICD-10-CM

## 2016-01-07 LAB — CBC WITH DIFFERENTIAL/PLATELET
BASOS PCT: 0 %
Basophils Absolute: 0 10*3/uL (ref 0.0–0.1)
EOS PCT: 1 %
Eosinophils Absolute: 0.1 10*3/uL (ref 0.0–0.7)
HEMATOCRIT: 28.9 % — AB (ref 36.0–46.0)
Hemoglobin: 9.9 g/dL — ABNORMAL LOW (ref 12.0–15.0)
Lymphocytes Relative: 15 %
Lymphs Abs: 1.2 10*3/uL (ref 0.7–4.0)
MCH: 30.9 pg (ref 26.0–34.0)
MCHC: 34.3 g/dL (ref 30.0–36.0)
MCV: 90.3 fL (ref 78.0–100.0)
MONO ABS: 0.7 10*3/uL (ref 0.1–1.0)
MONOS PCT: 8 %
NEUTROS ABS: 6.4 10*3/uL (ref 1.7–7.7)
Neutrophils Relative %: 76 %
Platelets: 332 10*3/uL (ref 150–400)
RBC: 3.2 MIL/uL — ABNORMAL LOW (ref 3.87–5.11)
RDW: 13.4 % (ref 11.5–15.5)
WBC: 8.4 10*3/uL (ref 4.0–10.5)

## 2016-01-07 LAB — URINALYSIS, ROUTINE W REFLEX MICROSCOPIC
BILIRUBIN URINE: NEGATIVE
GLUCOSE, UA: NEGATIVE mg/dL
HGB URINE DIPSTICK: NEGATIVE
KETONES UR: NEGATIVE mg/dL
Nitrite: NEGATIVE
PH: 6 (ref 5.0–8.0)
Protein, ur: NEGATIVE mg/dL
SPECIFIC GRAVITY, URINE: 1.012 (ref 1.005–1.030)

## 2016-01-07 LAB — BASIC METABOLIC PANEL
ANION GAP: 9 (ref 5–15)
BUN: 38 mg/dL — AB (ref 6–20)
CHLORIDE: 103 mmol/L (ref 101–111)
CO2: 26 mmol/L (ref 22–32)
Calcium: 8.9 mg/dL (ref 8.9–10.3)
Creatinine, Ser: 1.79 mg/dL — ABNORMAL HIGH (ref 0.44–1.00)
GFR calc Af Amer: 29 mL/min — ABNORMAL LOW (ref >60)
GFR calc non Af Amer: 25 mL/min — ABNORMAL LOW (ref >60)
Glucose, Bld: 103 mg/dL — ABNORMAL HIGH (ref 65–99)
POTASSIUM: 3.8 mmol/L (ref 3.5–5.1)
SODIUM: 138 mmol/L (ref 135–145)

## 2016-01-07 MED ORDER — METOPROLOL TARTRATE 25 MG PO TABS
25.0000 mg | ORAL_TABLET | Freq: Two times a day (BID) | ORAL | Status: DC
  Administered 2016-01-07 – 2016-01-10 (×7): 25 mg via ORAL
  Filled 2016-01-07 (×9): qty 1

## 2016-01-07 MED ORDER — ONDANSETRON HCL 4 MG PO TABS: 4.0000 mg | ORAL_TABLET | Freq: Four times a day (QID) | ORAL | Status: DC | PRN

## 2016-01-07 MED ORDER — FUROSEMIDE 20 MG PO TABS
20.0000 mg | ORAL_TABLET | Freq: Two times a day (BID) | ORAL | Status: DC
  Administered 2016-01-07 – 2016-01-08 (×2): 20 mg via ORAL
  Filled 2016-01-07 (×2): qty 1

## 2016-01-07 MED ORDER — B COMPLEX VITAMINS PO CAPS: 1.0000 | ORAL_CAPSULE | Freq: Every day | ORAL | Status: DC

## 2016-01-07 MED ORDER — SENNOSIDES-DOCUSATE SODIUM 8.6-50 MG PO TABS
1.0000 | ORAL_TABLET | Freq: Every day | ORAL | Status: DC
  Administered 2016-01-08 – 2016-01-11 (×3): 1 via ORAL
  Filled 2016-01-07 (×6): qty 1

## 2016-01-07 MED ORDER — ONDANSETRON HCL 4 MG/2ML IJ SOLN
4.0000 mg | Freq: Four times a day (QID) | INTRAMUSCULAR | Status: DC | PRN
  Administered 2016-01-08: 12:00:00 4 mg via INTRAVENOUS
  Filled 2016-01-07: qty 2

## 2016-01-07 MED ORDER — MORPHINE SULFATE (PF) 2 MG/ML IV SOLN
2.0000 mg | Freq: Once | INTRAVENOUS | Status: AC
  Administered 2016-01-07: 2 mg via INTRAVENOUS
  Filled 2016-01-07: qty 1

## 2016-01-07 MED ORDER — POLYETHYLENE GLYCOL 3350 17 G PO PACK: 17.0000 g | PACK | Freq: Every day | ORAL | Status: DC | PRN

## 2016-01-07 MED ORDER — FENTANYL CITRATE (PF) 100 MCG/2ML IJ SOLN
50.0000 ug | Freq: Once | INTRAMUSCULAR | Status: AC
  Administered 2016-01-07: 50 ug via INTRAVENOUS
  Filled 2016-01-07: qty 2

## 2016-01-07 MED ORDER — ALBUTEROL SULFATE (2.5 MG/3ML) 0.083% IN NEBU: 2.5000 mg | INHALATION_SOLUTION | Freq: Four times a day (QID) | RESPIRATORY_TRACT | Status: DC | PRN

## 2016-01-07 MED ORDER — ASPIRIN EC 81 MG PO TBEC
81.0000 mg | DELAYED_RELEASE_TABLET | Freq: Every day | ORAL | Status: DC
  Administered 2016-01-07 – 2016-01-11 (×5): 81 mg via ORAL
  Filled 2016-01-07 (×5): qty 1

## 2016-01-07 MED ORDER — ALBUTEROL SULFATE HFA 108 (90 BASE) MCG/ACT IN AERS: 1.0000 | INHALATION_SPRAY | Freq: Four times a day (QID) | RESPIRATORY_TRACT | Status: DC | PRN

## 2016-01-07 MED ORDER — VITAMIN D 1000 UNITS PO TABS
1000.0000 [IU] | ORAL_TABLET | Freq: Every day | ORAL | Status: DC
Start: 2016-01-08 — End: 2016-01-11
  Administered 2016-01-08 – 2016-01-11 (×4): 1000 [IU] via ORAL
  Filled 2016-01-07 (×4): qty 1

## 2016-01-07 MED ORDER — BIOTIN 5000 MCG PO CAPS: 1.0000 | ORAL_CAPSULE | Freq: Every day | ORAL | Status: DC

## 2016-01-07 MED ORDER — ATORVASTATIN CALCIUM 20 MG PO TABS
40.0000 mg | ORAL_TABLET | ORAL | Status: DC
  Administered 2016-01-08 – 2016-01-10 (×2): 40 mg via ORAL
  Filled 2016-01-07 (×3): qty 2

## 2016-01-07 MED ORDER — ENOXAPARIN SODIUM 30 MG/0.3ML ~~LOC~~ SOLN
30.0000 mg | SUBCUTANEOUS | Status: DC
  Administered 2016-01-07 – 2016-01-10 (×4): 30 mg via SUBCUTANEOUS
  Filled 2016-01-07 (×4): qty 0.3

## 2016-01-07 MED ORDER — LEVOTHYROXINE SODIUM 50 MCG PO TABS
50.0000 ug | ORAL_TABLET | Freq: Every day | ORAL | Status: DC
  Administered 2016-01-08 – 2016-01-11 (×4): 50 ug via ORAL
  Filled 2016-01-07 (×4): qty 1

## 2016-01-07 MED ORDER — B COMPLEX-C PO TABS
1.0000 | ORAL_TABLET | Freq: Every day | ORAL | Status: DC
  Administered 2016-01-08 – 2016-01-11 (×4): 1 via ORAL
  Filled 2016-01-07 (×4): qty 1

## 2016-01-07 MED ORDER — ONDANSETRON HCL 4 MG/2ML IJ SOLN
4.0000 mg | Freq: Once | INTRAMUSCULAR | Status: AC
  Administered 2016-01-07: 4 mg via INTRAVENOUS
  Filled 2016-01-07: qty 2

## 2016-01-07 MED ORDER — DEXTROSE 5 % IV SOLN
2.0000 g | INTRAVENOUS | Status: DC
  Administered 2016-01-07: 2 g via INTRAVENOUS
  Filled 2016-01-07: qty 2

## 2016-01-07 MED ORDER — ACETAMINOPHEN 650 MG RE SUPP: 650.0000 mg | Freq: Four times a day (QID) | RECTAL | Status: DC | PRN

## 2016-01-07 MED ORDER — ESCITALOPRAM OXALATE 10 MG PO TABS
5.0000 mg | ORAL_TABLET | Freq: Every day | ORAL | Status: DC
  Administered 2016-01-07 – 2016-01-11 (×5): 5 mg via ORAL
  Filled 2016-01-07: qty 1
  Filled 2016-01-07: qty 0.5
  Filled 2016-01-07 (×2): qty 1

## 2016-01-07 MED ORDER — MORPHINE SULFATE (PF) 4 MG/ML IV SOLN
4.0000 mg | INTRAVENOUS | Status: DC | PRN
  Administered 2016-01-07: 4 mg via INTRAVENOUS
  Filled 2016-01-07: qty 1

## 2016-01-07 MED ORDER — HYDROCODONE-ACETAMINOPHEN 5-325 MG PO TABS
1.0000 | ORAL_TABLET | ORAL | Status: DC | PRN
  Administered 2016-01-07 – 2016-01-08 (×4): 1 via ORAL
  Administered 2016-01-09: 2 via ORAL
  Administered 2016-01-09 – 2016-01-10 (×3): 1 via ORAL
  Administered 2016-01-11: 09:00:00 2 via ORAL
  Filled 2016-01-07: qty 1
  Filled 2016-01-07 (×2): qty 2
  Filled 2016-01-07: qty 1
  Filled 2016-01-07: qty 2
  Filled 2016-01-07: qty 1
  Filled 2016-01-07: qty 2
  Filled 2016-01-07 (×2): qty 1

## 2016-01-07 MED ORDER — ACETAMINOPHEN 325 MG PO TABS: 650.0000 mg | ORAL_TABLET | Freq: Four times a day (QID) | ORAL | Status: DC | PRN

## 2016-01-07 NOTE — ED Notes (Signed)
Bed: JG87 Expected date:  Expected time:  Means of arrival:  Comments: 82 yo hip pain

## 2016-01-07 NOTE — ED Notes (Signed)
She is fairly comfortable while she is lying in bed. She is utterly unable even to try to sit up d/t excruciating pain at upper right hip area whever she tries to move.

## 2016-01-07 NOTE — ED Provider Notes (Signed)
[facility] DEPT Provider Note   CSN: [id number] Arrival date & time: 01/07/16  3474     History   Chief Complaint Chief Complaint  Patient presents with  . Back Pain  . Hip Pain    HPI [name] [name] is a 82 y.o. female.  HPI 82 year old female with past medical history as below who presents with severe right hip pain. The patient states she fell 4 weeks ago and has had progressively worsening back and hip pain since then. She was seen in the ED at that time and underwent plain films which were negative. Over the last 24 hours, her pain has acutely worsen. She states she was turning over in bed when she felt a popping sensation in her right pelvis. Since then, she has had severe, 10 out of 10 pain. She is no longer able to walk or get out of bed on her own due to this pain. Pain is made worse with any movement or palpation. She has also noticed mild suprapubic pain and urinary frequency. She lives alone and was unable to get out of bed today so EMS was called. No fevers or chills.  Past Medical History:  Diagnosis Date  . Anemia, unspecified   . Anxiety state, unspecified   . Depressive disorder, not elsewhere classified   . Diverticulosis of colon (without mention of hemorrhage)   . Diverticulosis of colon with hemorrhage   . Esophageal reflux   . Lumbago   . Myocardial infarction   . Other and unspecified hyperlipidemia   . Persistent disorder of initiating or maintaining sleep   . Thyroid disease   . Unspecified asthma(493.90)   . Unspecified disorder resulting from impaired renal function   . Unspecified essential hypertension    Dr [name]  . Unspecified osteomyelitis, site unspecified   . Unspecified vitamin D deficiency     Patient Active Problem List   Diagnosis Date Noted  . Pubic ramus fracture ([place]) 01/07/2016  . Neck pain, musculoskeletal 08/10/2015  . Gout 06/06/2015  . Anemia 03/20/2015  . Piriformis syndrome of left side 12/13/2014  . Osteoporosis  09/14/2014  . Compression fracture of T12 vertebra with delayed healing 05/04/2014  . Poor tolerance for ambulation   . Back pain   . Decreased ambulation status 05/02/2014  . Left-sided low back pain with sciatica 05/02/2014  . CKD (chronic kidney disease) stage 4, GFR 15-29 ml/min ([facility]) 05/02/2014  . Situational depression 10/26/2013  . Pes anserine bursitis 10/05/2013  . Alopecia 10/01/2013  . Primary localized osteoarthrosis, lower leg 09/07/2013  . Left leg weakness 08/25/2013  . Fall against object 08/25/2013  . Left ankle pain 08/25/2013  . Thyroid nodule 04/27/2013  . CHF (congestive heart failure) ([place]) 04/20/2013  . PNA (pneumonia) 04/20/2013  . A-fib ([place]) 04/18/2013  . Nausea alone 04/16/2013  . Unspecified constipation 04/16/2013  . Unspecified hypothyroidism 04/16/2013  . Cough 04/16/2013  . Disuse syndrome 03/15/2013  . Influenza due to influenza A virus 03/07/2013  . Acute non-ST segment elevation myocardial infarction ([place]) 03/06/2013  . Cramps, extremity 07/01/2012  . Minimal cognitive impairment 10/07/2011  . Restless leg 09/16/2011  . Neuropathy ([place]) 08/16/2011  . Anemia in chronic renal disease 12/08/2010  . BRONCHITIS NOT SPECIFIED AS ACUTE OR CHRONIC 03/20/2009  . Bilateral carotid bruits 01/12/2009  . Coronary atherosclerosis 10/21/2008  . PANCREATITIS 10/21/2008  . Disorder resulting from impaired renal function 10/21/2008  . OSTEOMYELITIS 10/21/2008  . Diverticulosis of colon with hemorrhage 09/20/2008  . DEHYDRATION (  VOLUME DEPLETION) 04/11/2008  . SYNCOPE 04/11/2008  . UNSPECIFIED DISORDER OF URETHRA&URINARY TRACT 02/04/2008  . Vitamin D deficiency 12/28/2007  . Asthma 10/28/2007  . PYELONEPHRITIS 10/28/2007  . Fatigue 10/28/2007  . Dyslipidemia 06/23/2007  . Anxiety state 06/23/2007  . INSOMNIA, PERSISTENT 06/23/2007  . Essential hypertension 06/23/2007  . PERIPHERAL VASCULAR DISEASE 06/23/2007  . GERD 06/23/2007  . DIVERTICULOSIS, COLON  06/23/2007  . LOW BACK PAIN 06/23/2007    Past Surgical History:  Procedure Laterality Date  . BONE MARROW BIOPSY    . CORONARY ARTERY BYPASS GRAFT  03/25/13   x4. CABS ON PUMP-Surgeon Jomarie Longs.  [name] PARTIAL COLECTOMY  1995   diverticulosis of colon iwth hemorrhage.   . status post right carotid enterectomy      OB History    No data available       Home Medications    Prior to Admission medications   Medication Sig Start Date End Date Taking? Authorizing Provider  albuterol (PROVENTIL HFA;VENTOLIN HFA) 108 (90 Base) MCG/ACT inhaler Inhale 1-2 puffs into the lungs every 6 (six) hours as needed for wheezing or shortness of breath. 05/11/15  Yes Burnard Hawthorne, FNP  aspirin EC 81 MG tablet Take 1 tablet (81 mg total) by mouth daily. 12/29/15  Yes Belva Crome, MD  atorvastatin (LIPITOR) 40 MG tablet Take 40 mg by mouth every other day. 07/01/15  Yes Historical Provider, MD  b complex vitamins capsule Take 1 capsule by mouth daily.   Yes Historical Provider, MD  Biotin 5000 MCG CAPS Take 1 capsule by mouth daily.   Yes Historical Provider, MD  Cholecalciferol (EQL VITAMIN D3) 1000 UNITS tablet Take 1 tablet (1,000 Units total) by mouth daily. 05/12/14  Yes [name] Plotnikov, MD  darbepoetin (ARANESP) 100 MCG/0.5ML SOLN injection Inject 100 mcg into the skin every 8 (eight) weeks.   Yes Historical Provider, MD  escitalopram (LEXAPRO) 5 MG tablet Take 1 tablet (5 mg total) by mouth daily. 01/03/16  Yes [name] Plotnikov, MD  furosemide (LASIX) 20 MG tablet TAKE 1 TABLET BY MOUTH DAILY Patient taking differently: 1bid 11/13/15  Yes Josue Hector, MD  levothyroxine (SYNTHROID, LEVOTHROID) 50 MCG tablet TAKE 1 TABLET BY MOUTH EVERY DAY BEFORE BREAKFAST 05/31/15  Yes Cassandria Anger, MD  metoprolol tartrate (LOPRESSOR) 25 MG tablet Take 1 tablet (25 mg total) by mouth 2 (two) times daily. 12/29/15  Yes Belva Crome, MD  pantoprazole (PROTONIX) 40 MG tablet Take 1 tablet (40 mg total)  by mouth daily as needed. 10/27/15  Yes [name] Plotnikov, MD  sennosides-docusate sodium (SENOKOT-S) 8.6-50 MG tablet Take 1 tablet by mouth daily.    Yes Historical Provider, MD    Family History Family History  Problem Relation Age of Onset  . Hypertension Other   . COPD Mother   . Cancer Father     lymphoma    Social History Social History  Substance Use Topics  . Smoking status: Never Smoker  . Smokeless tobacco: Never Used  . Alcohol use 0.0 oz/week    1 - 2 Shots of liquor per week     Allergies   Codeine sulfate; Darvon [propoxyphene]; Propoxyphene napsylate; Sulfacetamide sodium-sulfur; Sulfamethoxazole-trimethoprim; and Zolpidem   Review of Systems Review of Systems  Constitutional: Positive for fatigue. Negative for chills and fever.  HENT: Negative for congestion, rhinorrhea and sore throat.   Eyes: Negative for visual disturbance.  Respiratory: Negative for cough, shortness of breath and wheezing.  Cardiovascular: Negative for chest pain and leg swelling.  Gastrointestinal: Negative for abdominal pain, diarrhea, nausea and vomiting.  Genitourinary: Negative for dysuria, flank pain, vaginal bleeding and vaginal discharge.  Musculoskeletal: Positive for arthralgias and gait problem. Negative for neck pain.  Skin: Negative for rash.  Allergic/Immunologic: Negative for immunocompromised state.  Neurological: Negative for syncope and headaches.  Hematological: Does not bruise/bleed easily.  All other systems reviewed and are negative.    Physical Exam Updated Vital Signs BP 135/65   Pulse 73   Temp 98.1 F (36.7 C) (Oral)   Resp 15   Ht '5\' 3"'  (1.6 m)   Wt 164 lb (74.4 kg)   SpO2 93%   BMI 29.05 kg/m   Physical Exam  Constitutional: She is oriented to person, place, and time. She appears well-developed and well-nourished. No distress.  HENT:  Head: Normocephalic and atraumatic.  Eyes: Conjunctivae are normal.  Neck: Neck supple.    Cardiovascular: Normal rate, regular rhythm and normal heart sounds.  Exam reveals no friction rub.   No murmur heard. Pulmonary/Chest: Effort normal and breath sounds normal. No respiratory distress. She has no wheezes. She has no rales.  Abdominal: She exhibits no distension.  Musculoskeletal: She exhibits no edema.  Neurological: She is alert and oriented to person, place, and time. She exhibits normal muscle tone.  Skin: Skin is warm. Capillary refill takes less than 2 seconds.  Psychiatric: She has a normal mood and affect.  Nursing note and vitals reviewed.   LOWER EXTREMITY EXAM: RIGHT  INSPECTION & PALPATION: Markedly tenderness to palpation over right anterior pubic bone. No bruising or deformity. No open wounds. Exquisite tenderness with external and internal rotation of leg at hip.  SENSORY: sensation is intact to light touch in:  Superficial peroneal nerve distribution (over dorsum of foot) Deep peroneal nerve distribution (over first dorsal web space) Sural nerve distribution (over lateral aspect 5th metatarsal) Saphenous nerve distribution (over medial instep)  MOTOR:  + Motor EHL (great toe dorsiflexion) + FHL (great toe plantar flexion)  + TA (ankle dorsiflexion)  + GSC (ankle plantar flexion)  VASCULAR: 2+ dorsalis pedis and posterior tibialis pulses Capillary refill < 2 sec, toes warm and well-perfused  COMPARTMENTS: Soft, warm, well-perfused No pain with passive extension No parethesias   ED Treatments / Results  Labs (all labs ordered are listed, but only abnormal results are displayed) Labs Reviewed  CBC WITH DIFFERENTIAL/PLATELET - Abnormal; Notable for the following:       Result Value   RBC 3.20 (*)    Hemoglobin 9.9 (*)    HCT 28.9 (*)    All other components within normal limits  BASIC METABOLIC PANEL - Abnormal; Notable for the following:    Glucose, Bld 103 (*)    BUN 38 (*)    Creatinine, Ser 1.79 (*)    GFR calc non Af Amer 25 (*)     GFR calc Af Amer 29 (*)    All other components within normal limits  URINALYSIS, ROUTINE W REFLEX MICROSCOPIC - Abnormal; Notable for the following:    APPearance HAZY (*)    Leukocytes, UA LARGE (*)    Squamous Epithelial / LPF 0-5 (*)    Non Squamous Epithelial 0-5 (*)    All other components within normal limits  URINE CULTURE    EKG  EKG Interpretation None       Radiology Ct Lumbar Spine Wo Contrast  Result Date: 01/07/2016 CLINICAL DATA:  Worsening pain in the  right hip. Injured turning over and head. EXAM: CT LUMBAR SPINE WITHOUT CONTRAST TECHNIQUE: Multidetector CT imaging of the lumbar spine was performed without intravenous contrast administration. Multiplanar CT image reconstructions were also generated. COMPARISON:  10/21/2015 FINDINGS: Segmentation: 5 lumbar type vertebral bodies. Alignment: No significant malalignment. Vertebrae: There is a healing partial compression fracture of T11 which was acute on the study of 10/21/2015. Loss of height is 10% or less. Solid bone fusion noted from T10 through T12. Old augmented fracture at T12 which has not changed. Old inferior endplate fracture/ Schmorl's node at L1 which has not changed. Old inferior endplate fracture at L2 has not changed. Old biconcave fracture at L3 has not changed. Minimal superior endplate depression at L4 has not changed. Minimal endplate deformities at L5 narrow but not changed. No evidence of sacral fracture. Paraspinal and other soft tissues: Aortic atherosclerosis. Otherwise negative. Disc levels: L1-2: Retrolisthesis of 3 mm. Mild stenosis of the lateral recesses and foramina. L2-3: Disc bulge and posterior bowing of the posterosuperior endplate of L3. Mild stenosis of both lateral recesses. L3-4: Disc bulge. Facet hypertrophy. Mild stenosis of both lateral recesses. L4-5: Disc bulge. Mild facet hypertrophy. Mild stenosis of both lateral recesses. L5-S1: Endplate osteophytes and mild bulging of the disc. No  central canal stenosis. Mild foraminal stenosis because of osteophytic encroachment right more than left. IMPRESSION: No acute fractures seen today. There is a subacute, healing fracture at T11 with loss of height of only 10% or less. This was acute in September. Old augmented fracture at T12. Old un augmented fractures at the other lumbar levels as outlined above. Electronically Signed   By: Nelson Chimes M.D.   On: 01/07/2016 11:28   Ct Hip Right Wo Contrast  Result Date: 01/07/2016 CLINICAL DATA:  Right hip pain increasing last day. EXAM: CT OF THE RIGHT HIP WITHOUT CONTRAST TECHNIQUE: Multidetector CT imaging of the right hip was performed according to the standard protocol. Multiplanar CT image reconstructions were also generated. COMPARISON:  None. FINDINGS: Bones/Joint/Cartilage Mild joint space narrowing and early spurring within the right hip joint. No evidence of femoral neck fracture. Mild cortical irregularity seen on the coronal reconstructed images in the right superior pubic ramus on image 20 which could reflect nondisplaced superior pubic ramus fracture. This cannot be confirmed on other series. Ligaments Suboptimally assessed by CT. Muscles and Tendons Grossly unremarkable. Soft tissues No acute soft tissue abnormality. Calcifications in the iliac and femoral vessels. IMPRESSION: No evidence of femoral neck fracture. Questionable nondisplaced right superior pubic ramus fracture, only seen on coronal imaging. Early degenerative changes in the right hip. Electronically Signed   By: Rolm Baptise M.D.   On: 01/07/2016 11:38    Procedures Procedures (including critical care time)  Medications Ordered in ED Medications  cefTRIAXone (ROCEPHIN) 2 g in dextrose 5 % 50 mL IVPB (2 g Intravenous New Bag/Given 01/07/16 1213)  fentaNYL (SUBLIMAZE) injection 50 mcg (50 mcg Intravenous Given 01/07/16 1107)  morphine 2 MG/ML injection 2 mg (2 mg Intravenous Given 01/07/16 1214)  ondansetron (ZOFRAN)  injection 4 mg (4 mg Intravenous Given 01/07/16 1214)     Initial Impression / Assessment and Plan / ED Course  I have reviewed the triage vital signs and the nursing notes.  Pertinent labs & imaging results that were available during my care of the patient were reviewed by me and considered in my medical decision making (see chart for details).  Clinical Course     82 year old female who presents with  severe right hip pain and immobility after possible injury rolling in bed. On arrival, vital signs are stable. She is unable to move out of the bed due to pain. Distal neurovascular chair is intact. Given marked tenderness with history of recent negative plain films, CT obtained and shows nondisplaced superior pubic ramus fracture. This correlates with her area of tenderness. Given her significant pain with acute pelvic fracture, as well as inability to ambulate, will admit for further workup and pain management with PT and OT. Otherwise, urinalysis does also show UTI which could be contribute eating somewhat to her fatigue and will treat with Rocephin. Urine culture sent. No fevers or signs of sepsis.  Final Clinical Impressions(s) / ED Diagnoses   Final diagnoses:  Closed fracture of ramus of right pubis, initial encounter ([place])  Cystitis with hematuria  Intractable pain  Immobility    New Prescriptions New Prescriptions   No medications on file     Duffy Bruce, MD 01/07/16 1309

## 2016-01-07 NOTE — H&P (Addendum)
History and Physical    [name] [name] CNO:[id number] DOB: 1931-02-18 DOA: 01/07/2016  PCP: [name] Kehr, MD  Patient coming from: Home  Chief Complaint: Back and hip pain  HPI: [name] [name] is a 82 y.o. female with medical history significant of an STEMI status post CABG, CAD, paroxysmal atrial fibrillation, anxiety/depression, hypothyroidism, CKD stage IV. Symptoms started about 3 months ago after she had a fall. She was evaluated in the ED at that time and found to have no fractures. Since that time, her pain has remained controlled until the last few days as she has been increasing her activities. Yesterday morning, she will woke up and roll over and heard/felt a pop. Throat the day she had some pain but was able to ambulate. This morning however, she had severe pain to the point where she cannot ablate out of bed. Pain is located in right hip area and back and is in the same location as when she had a fall 3 months ago.  ED Course: Vitals: Afebrile, normal pulse. Normotensive. In low 90s on room air. Labs: creatinine of 1.79. Hemoglobin of 9.9 Imaging: CT shows possible nondisplaced right superior pubic ramus fracture, multiple areas of disc bulge with arthritic changes along spine and subacute healing fracture of T11 and T12 with solid bone fusion from T10 to T12. Medications/Course: Fentanyl and morphine  Review of Systems: Review of Systems  Constitutional: Negative for chills and fever.  Respiratory: Negative for cough, hemoptysis, sputum production, shortness of breath and wheezing.   Cardiovascular: Negative for chest pain, orthopnea and leg swelling.  Gastrointestinal: Negative for abdominal pain, constipation (on a stool softener), diarrhea, nausea and vomiting.  Genitourinary: Negative for dysuria, frequency, hematuria and urgency.  All other systems reviewed and are negative.   Past Medical History:  Diagnosis Date  . Anemia, unspecified   . Anxiety state,  unspecified   . Depressive disorder, not elsewhere classified   . Diverticulosis of colon (without mention of hemorrhage)   . Diverticulosis of colon with hemorrhage   . Esophageal reflux   . Lumbago   . Myocardial infarction   . Other and unspecified hyperlipidemia   . Persistent disorder of initiating or maintaining sleep   . Thyroid disease   . Unspecified asthma(493.90)   . Unspecified disorder resulting from impaired renal function   . Unspecified essential hypertension    Dr [name]  . Unspecified osteomyelitis, site unspecified   . Unspecified vitamin D deficiency     Past Surgical History:  Procedure Laterality Date  . BONE MARROW BIOPSY    . CORONARY ARTERY BYPASS GRAFT  03/25/13   x4. CABS ON PUMP-Surgeon Jomarie Longs.  [name] PARTIAL COLECTOMY  1995   diverticulosis of colon iwth hemorrhage.   . status post right carotid enterectomy       reports that she has never smoked. She has never used smokeless tobacco. She reports that she drinks alcohol. She reports that she does not use drugs.  Allergies  Allergen Reactions  . Codeine Sulfate Nausea And Vomiting  . Darvon [Propoxyphene] Nausea And Vomiting    hallucinations  . Propoxyphene Napsylate Nausea And Vomiting  . Sulfacetamide Sodium-Sulfur Nausea And Vomiting  . Sulfamethoxazole-Trimethoprim Nausea Only    Can't remember reaction  . Zolpidem     Other reaction(s): Mental Status Changes (intolerance)    Family History  Problem Relation Age of Onset  . Hypertension Other   . COPD Mother   . Cancer Father  lymphoma    Prior to Admission medications   Medication Sig Start Date End Date Taking? Authorizing Provider  albuterol (PROVENTIL HFA;VENTOLIN HFA) 108 (90 Base) MCG/ACT inhaler Inhale 1-2 puffs into the lungs every 6 (six) hours as needed for wheezing or shortness of breath. 05/11/15  Yes Burnard Hawthorne, FNP  aspirin EC 81 MG tablet Take 1 tablet (81 mg total) by mouth daily. 12/29/15  Yes Belva Crome,  MD  atorvastatin (LIPITOR) 40 MG tablet Take 40 mg by mouth every other day. 07/01/15  Yes Historical Provider, MD  b complex vitamins capsule Take 1 capsule by mouth daily.   Yes Historical Provider, MD  Biotin 5000 MCG CAPS Take 1 capsule by mouth daily.   Yes Historical Provider, MD  Cholecalciferol (EQL VITAMIN D3) 1000 UNITS tablet Take 1 tablet (1,000 Units total) by mouth daily. 05/12/14  Yes [name] Plotnikov, MD  darbepoetin (ARANESP) 100 MCG/0.5ML SOLN injection Inject 100 mcg into the skin every 8 (eight) weeks.   Yes Historical Provider, MD  escitalopram (LEXAPRO) 5 MG tablet Take 1 tablet (5 mg total) by mouth daily. 01/03/16  Yes [name] Plotnikov, MD  furosemide (LASIX) 20 MG tablet TAKE 1 TABLET BY MOUTH DAILY Patient taking differently: 1bid 11/13/15  Yes Josue Hector, MD  levothyroxine (SYNTHROID, LEVOTHROID) 50 MCG tablet TAKE 1 TABLET BY MOUTH EVERY DAY BEFORE BREAKFAST 05/31/15  Yes Cassandria Anger, MD  metoprolol tartrate (LOPRESSOR) 25 MG tablet Take 1 tablet (25 mg total) by mouth 2 (two) times daily. 12/29/15  Yes Belva Crome, MD  pantoprazole (PROTONIX) 40 MG tablet Take 1 tablet (40 mg total) by mouth daily as needed. 10/27/15  Yes [name] Plotnikov, MD  sennosides-docusate sodium (SENOKOT-S) 8.6-50 MG tablet Take 1 tablet by mouth daily.    Yes Historical Provider, MD    Physical Exam: Vitals:   01/07/16 0935 01/07/16 1236  BP: 160/70 135/65  Pulse: 76 73  Resp: 16 15  Temp: 98.1 F (36.7 C)   TempSrc: Oral   SpO2: 94% 93%  Weight: 74.4 kg (164 lb)   Height: _0  (1.6 m)     Constitutional: NAD, calm, comfortable Eyes: PERRL, lids and conjunctivae normal ENMT: Mucous membranes are moist. Posterior pharynx clear of any exudate or lesions. Top partial dentures.  Neck: normal, supple, no masses, no thyromegaly Respiratory: clear to auscultation bilaterally, no wheezing, no crackles. Normal respiratory effort. No accessory muscle use.    Cardiovascular: Regular rate and rhythm, no murmurs / rubs / gallops. No extremity edema. 2+ pedal pulses. No carotid bruits.  Abdomen: no tenderness, no masses palpated. Bowel sounds positive.  Musculoskeletal: tenderness along right hip and right back near sacrum. no clubbing / cyanosis. No joint deformity upper and lower extremities. Skin: no rashes, lesions, ulcers. No induration Neurologic: CN 2-12 grossly intact. Sensation intact, DTR normal. Strength 3/5 in RLE and 4/5 in LLE. 5/5 in bilateral upper extremities Psychiatric: Normal judgment and insight. Alert and oriented x 3. Normal mood.   Labs on Admission: I have personally reviewed following labs and imaging studies  CBC:  Recent Labs Lab 01/07/16 1034  WBC 8.4  NEUTROABS 6.4  HGB 9.9*  HCT 28.9*  MCV 90.3  PLT 233   Basic Metabolic Panel:  Recent Labs Lab 01/07/16 1034  NA 138  K 3.8  CL 103  CO2 26  GLUCOSE 103*  BUN 38*  CREATININE 1.79*  CALCIUM 8.9   GFR: Estimated Creatinine Clearance: 23.4  mL/min (by C-G formula based on SCr of 1.79 mg/dL (H)).  Urine analysis:    Component Value Date/Time   COLORURINE YELLOW 01/07/2016 1029   APPEARANCEUR HAZY (A) 01/07/2016 1029   LABSPEC 1.012 01/07/2016 1029   PHURINE 6.0 01/07/2016 1029   GLUCOSEU NEGATIVE 01/07/2016 1029   GLUCOSEU NEGATIVE 10/01/2012 0926   HGBUR NEGATIVE 01/07/2016 1029   HGBUR trace-intact 02/04/2008 0000   BILIRUBINUR NEGATIVE 01/07/2016 1029   KETONESUR NEGATIVE 01/07/2016 1029   PROTEINUR NEGATIVE 01/07/2016 1029   UROBILINOGEN 0.2 05/02/2014 0842   NITRITE NEGATIVE 01/07/2016 1029   LEUKOCYTESUR LARGE (A) 01/07/2016 1029   Radiological Exams on Admission: Ct Lumbar Spine Wo Contrast  Result Date: 01/07/2016 CLINICAL DATA:  Worsening pain in the right hip. Injured turning over and head. EXAM: CT LUMBAR SPINE WITHOUT CONTRAST TECHNIQUE: Multidetector CT imaging of the lumbar spine was performed without intravenous contrast  administration. Multiplanar CT image reconstructions were also generated. COMPARISON:  10/21/2015 FINDINGS: Segmentation: 5 lumbar type vertebral bodies. Alignment: No significant malalignment. Vertebrae: There is a healing partial compression fracture of T11 which was acute on the study of 10/21/2015. Loss of height is 10% or less. Solid bone fusion noted from T10 through T12. Old augmented fracture at T12 which has not changed. Old inferior endplate fracture/ Schmorl's node at L1 which has not changed. Old inferior endplate fracture at L2 has not changed. Old biconcave fracture at L3 has not changed. Minimal superior endplate depression at L4 has not changed. Minimal endplate deformities at L5 narrow but not changed. No evidence of sacral fracture. Paraspinal and other soft tissues: Aortic atherosclerosis. Otherwise negative. Disc levels: L1-2: Retrolisthesis of 3 mm. Mild stenosis of the lateral recesses and foramina. L2-3: Disc bulge and posterior bowing of the posterosuperior endplate of L3. Mild stenosis of both lateral recesses. L3-4: Disc bulge. Facet hypertrophy. Mild stenosis of both lateral recesses. L4-5: Disc bulge. Mild facet hypertrophy. Mild stenosis of both lateral recesses. L5-S1: Endplate osteophytes and mild bulging of the disc. No central canal stenosis. Mild foraminal stenosis because of osteophytic encroachment right more than left. IMPRESSION: No acute fractures seen today. There is a subacute, healing fracture at T11 with loss of height of only 10% or less. This was acute in September. Old augmented fracture at T12. Old un augmented fractures at the other lumbar levels as outlined above. Electronically Signed   By: Nelson Chimes M.D.   On: 01/07/2016 11:28   Ct Hip Right Wo Contrast  Result Date: 01/07/2016 CLINICAL DATA:  Right hip pain increasing last day. EXAM: CT OF THE RIGHT HIP WITHOUT CONTRAST TECHNIQUE: Multidetector CT imaging of the right hip was performed according to the  standard protocol. Multiplanar CT image reconstructions were also generated. COMPARISON:  None. FINDINGS: Bones/Joint/Cartilage Mild joint space narrowing and early spurring within the right hip joint. No evidence of femoral neck fracture. Mild cortical irregularity seen on the coronal reconstructed images in the right superior pubic ramus on image 20 which could reflect nondisplaced superior pubic ramus fracture. This cannot be confirmed on other series. Ligaments Suboptimally assessed by CT. Muscles and Tendons Grossly unremarkable. Soft tissues No acute soft tissue abnormality. Calcifications in the iliac and femoral vessels. IMPRESSION: No evidence of femoral neck fracture. Questionable nondisplaced right superior pubic ramus fracture, only seen on coronal imaging. Early degenerative changes in the right hip. Electronically Signed   By: Rolm Baptise M.D.   On: 01/07/2016 11:38    EKG: None obtained  Assessment/Plan Principal Problem:  Pubic ramus fracture (HCC) Active Problems:   Anxiety state   Essential hypertension   Anemia in chronic renal disease   A-fib (HCC)   CHF (congestive heart failure) (HCC)   CKD (chronic kidney disease) stage 4, GFR 15-29 ml/min (HCC)   Nondisplaced pubic fracture Questionably seen on CT but correlates with area of pain. Patient unable to ambulate secondary to pain. -Norco -morphine for breakthrough -miralax -Senokot-S -physical therapy -occupational therapy  Back pain Patient has significant arthritis with stenosis present. She is s/p kyphoplasty. -PT/OT as above -analgesics as above  ?Urinary tract infection Patient has a history of recurrent infections. She is asymptomatic. Urinalysis suggests infection but likely colonization. Hemoglobin negative but RBC too numerous to count? Asked lab to repeat. Has a history of coagulase negative staph -hold ceftriaxone -urine culture pending -follow up repeat urinalysis. If positive for RBCs with  hemoglobin, will treat  Paroxysmal trial fibrillation CHA2DS2-VASc Score is 6. Not on anticoagulation secondary to normal sinus rhythm per recent cardiology note. Rate is controlled. regular rhythm on exam. -continue metoprolol  CKD stage IV Stable  Hypothyroidism -continue synthroid  GERD -hold Protonix. Consider discontinuing at discharge secondary to osteoporosis  Osteoporosis Recent compression fracture. On vitamin D -continue vitamin D  Depression/anxiety -continue Lexapro  Anemia of chronic disease Stable. Follows up with hematology oncology -continue Aranesp as an outpatient  Hyperlipidemia -continue atorvastatin  Systolic congestive heart failure Last EF of 45-50% in 2015. Euvolemic. -continue furosemide -continue metoprolol as above -daily weights  CAD/CABG Carotid artery stenosis -continue aspirin   DVT prophylaxis: Lovenox Code Status: Full code Family Communication: Two daughters at bedside Disposition Plan: Anticipate discharge either to home or SNF in 1-2 days Consults called: None Admission status: Observation, medical floor   Cordelia Poche, MD [facility] Pager [phone]  If 7PM-7AM, please contact night-coverage [email or web address] Password [email or web address]  01/07/2016, 12:45 PM

## 2016-01-07 NOTE — ED Triage Notes (Signed)
She comes to [name] from home with c/o non-traumatic low back pain "for a while now".  Also, she states she felt a "pop" while turning in bed yesterday morning; and pain that had been radiating into right hip became worse and persists today. CMS intact all fingers/toes bilat. She arrives here having received 50 mcg of Fent[place] IV en route to hospital.

## 2016-01-08 ENCOUNTER — Inpatient Hospital Stay (HOSPITAL_COMMUNITY): Admission: RE | Admit: 2016-01-08 | Payer: Medicare Other | Source: Ambulatory Visit

## 2016-01-08 ENCOUNTER — Ambulatory Visit: Payer: Self-pay

## 2016-01-08 ENCOUNTER — Telehealth: Payer: Self-pay | Admitting: *Deleted

## 2016-01-08 ENCOUNTER — Other Ambulatory Visit: Payer: Self-pay

## 2016-01-08 DIAGNOSIS — I5022 Chronic systolic (congestive) heart failure: Secondary | ICD-10-CM | POA: Diagnosis present

## 2016-01-08 DIAGNOSIS — Y92003 Bedroom of unspecified non-institutional (private) residence as the place of occurrence of the external cause: Secondary | ICD-10-CM | POA: Diagnosis not present

## 2016-01-08 DIAGNOSIS — F4321 Adjustment disorder with depressed mood: Secondary | ICD-10-CM | POA: Diagnosis not present

## 2016-01-08 DIAGNOSIS — E785 Hyperlipidemia, unspecified: Secondary | ICD-10-CM | POA: Diagnosis present

## 2016-01-08 DIAGNOSIS — Z951 Presence of aortocoronary bypass graft: Secondary | ICD-10-CM | POA: Diagnosis not present

## 2016-01-08 DIAGNOSIS — Z9181 History of falling: Secondary | ICD-10-CM | POA: Diagnosis not present

## 2016-01-08 DIAGNOSIS — F329 Major depressive disorder, single episode, unspecified: Secondary | ICD-10-CM | POA: Diagnosis present

## 2016-01-08 DIAGNOSIS — R531 Weakness: Secondary | ICD-10-CM | POA: Diagnosis not present

## 2016-01-08 DIAGNOSIS — I252 Old myocardial infarction: Secondary | ICD-10-CM | POA: Diagnosis not present

## 2016-01-08 DIAGNOSIS — S329XXA Fracture of unspecified parts of lumbosacral spine and pelvis, initial encounter for closed fracture: Secondary | ICD-10-CM | POA: Diagnosis not present

## 2016-01-08 DIAGNOSIS — I480 Paroxysmal atrial fibrillation: Secondary | ICD-10-CM | POA: Diagnosis not present

## 2016-01-08 DIAGNOSIS — M7050 Other bursitis of knee, unspecified knee: Secondary | ICD-10-CM | POA: Diagnosis not present

## 2016-01-08 DIAGNOSIS — F411 Generalized anxiety disorder: Secondary | ICD-10-CM | POA: Diagnosis not present

## 2016-01-08 DIAGNOSIS — Z9049 Acquired absence of other specified parts of digestive tract: Secondary | ICD-10-CM | POA: Diagnosis not present

## 2016-01-08 DIAGNOSIS — S32599A Other specified fracture of unspecified pubis, initial encounter for closed fracture: Secondary | ICD-10-CM | POA: Diagnosis not present

## 2016-01-08 DIAGNOSIS — R278 Other lack of coordination: Secondary | ICD-10-CM | POA: Diagnosis not present

## 2016-01-08 DIAGNOSIS — G5702 Lesion of sciatic nerve, left lower limb: Secondary | ICD-10-CM | POA: Diagnosis not present

## 2016-01-08 DIAGNOSIS — K219 Gastro-esophageal reflux disease without esophagitis: Secondary | ICD-10-CM | POA: Diagnosis present

## 2016-01-08 DIAGNOSIS — I130 Hypertensive heart and chronic kidney disease with heart failure and stage 1 through stage 4 chronic kidney disease, or unspecified chronic kidney disease: Secondary | ICD-10-CM | POA: Diagnosis present

## 2016-01-08 DIAGNOSIS — X58XXXA Exposure to other specified factors, initial encounter: Secondary | ICD-10-CM | POA: Diagnosis present

## 2016-01-08 DIAGNOSIS — Z789 Other specified health status: Secondary | ICD-10-CM | POA: Diagnosis not present

## 2016-01-08 DIAGNOSIS — R2681 Unsteadiness on feet: Secondary | ICD-10-CM | POA: Diagnosis not present

## 2016-01-08 DIAGNOSIS — Z79899 Other long term (current) drug therapy: Secondary | ICD-10-CM | POA: Diagnosis not present

## 2016-01-08 DIAGNOSIS — L659 Nonscarring hair loss, unspecified: Secondary | ICD-10-CM | POA: Diagnosis not present

## 2016-01-08 DIAGNOSIS — R6889 Other general symptoms and signs: Secondary | ICD-10-CM | POA: Diagnosis not present

## 2016-01-08 DIAGNOSIS — R627 Adult failure to thrive: Secondary | ICD-10-CM | POA: Diagnosis present

## 2016-01-08 DIAGNOSIS — E860 Dehydration: Secondary | ICD-10-CM | POA: Diagnosis present

## 2016-01-08 DIAGNOSIS — M6281 Muscle weakness (generalized): Secondary | ICD-10-CM | POA: Diagnosis not present

## 2016-01-08 DIAGNOSIS — M545 Low back pain: Secondary | ICD-10-CM | POA: Diagnosis not present

## 2016-01-08 DIAGNOSIS — M109 Gout, unspecified: Secondary | ICD-10-CM | POA: Diagnosis not present

## 2016-01-08 DIAGNOSIS — S32511A Fracture of superior rim of right pubis, initial encounter for closed fracture: Secondary | ICD-10-CM | POA: Diagnosis present

## 2016-01-08 DIAGNOSIS — D631 Anemia in chronic kidney disease: Secondary | ICD-10-CM | POA: Diagnosis present

## 2016-01-08 DIAGNOSIS — R29898 Other symptoms and signs involving the musculoskeletal system: Secondary | ICD-10-CM | POA: Diagnosis not present

## 2016-01-08 DIAGNOSIS — N390 Urinary tract infection, site not specified: Secondary | ICD-10-CM | POA: Diagnosis present

## 2016-01-08 DIAGNOSIS — E039 Hypothyroidism, unspecified: Secondary | ICD-10-CM | POA: Diagnosis present

## 2016-01-08 DIAGNOSIS — I2510 Atherosclerotic heart disease of native coronary artery without angina pectoris: Secondary | ICD-10-CM | POA: Diagnosis present

## 2016-01-08 DIAGNOSIS — D649 Anemia, unspecified: Secondary | ICD-10-CM | POA: Diagnosis not present

## 2016-01-08 DIAGNOSIS — N184 Chronic kidney disease, stage 4 (severe): Secondary | ICD-10-CM | POA: Diagnosis not present

## 2016-01-08 DIAGNOSIS — S22080G Wedge compression fracture of T11-T12 vertebra, subsequent encounter for fracture with delayed healing: Secondary | ICD-10-CM | POA: Diagnosis not present

## 2016-01-08 DIAGNOSIS — I10 Essential (primary) hypertension: Secondary | ICD-10-CM | POA: Diagnosis not present

## 2016-01-08 DIAGNOSIS — I214 Non-ST elevation (NSTEMI) myocardial infarction: Secondary | ICD-10-CM | POA: Diagnosis not present

## 2016-01-08 DIAGNOSIS — I5020 Unspecified systolic (congestive) heart failure: Secondary | ICD-10-CM | POA: Diagnosis not present

## 2016-01-08 DIAGNOSIS — S32591A Other specified fracture of right pubis, initial encounter for closed fracture: Secondary | ICD-10-CM | POA: Diagnosis not present

## 2016-01-08 DIAGNOSIS — I6529 Occlusion and stenosis of unspecified carotid artery: Secondary | ICD-10-CM | POA: Diagnosis present

## 2016-01-08 DIAGNOSIS — M810 Age-related osteoporosis without current pathological fracture: Secondary | ICD-10-CM | POA: Diagnosis present

## 2016-01-08 DIAGNOSIS — M542 Cervicalgia: Secondary | ICD-10-CM | POA: Diagnosis not present

## 2016-01-08 DIAGNOSIS — M549 Dorsalgia, unspecified: Secondary | ICD-10-CM | POA: Diagnosis not present

## 2016-01-08 DIAGNOSIS — Z7982 Long term (current) use of aspirin: Secondary | ICD-10-CM | POA: Diagnosis not present

## 2016-01-08 DIAGNOSIS — M5442 Lumbago with sciatica, left side: Secondary | ICD-10-CM | POA: Diagnosis not present

## 2016-01-08 DIAGNOSIS — M25551 Pain in right hip: Secondary | ICD-10-CM | POA: Diagnosis not present

## 2016-01-08 DIAGNOSIS — M1710 Unilateral primary osteoarthritis, unspecified knee: Secondary | ICD-10-CM | POA: Diagnosis not present

## 2016-01-08 LAB — CBC
HCT: 27.8 % — ABNORMAL LOW (ref 36.0–46.0)
HEMOGLOBIN: 9.4 g/dL — AB (ref 12.0–15.0)
MCH: 31 pg (ref 26.0–34.0)
MCHC: 33.8 g/dL (ref 30.0–36.0)
MCV: 91.7 fL (ref 78.0–100.0)
PLATELETS: 329 10*3/uL (ref 150–400)
RBC: 3.03 MIL/uL — ABNORMAL LOW (ref 3.87–5.11)
RDW: 13.7 % (ref 11.5–15.5)
WBC: 7.3 10*3/uL (ref 4.0–10.5)

## 2016-01-08 LAB — BASIC METABOLIC PANEL
ANION GAP: 9 (ref 5–15)
BUN: 33 mg/dL — ABNORMAL HIGH (ref 6–20)
CALCIUM: 8.8 mg/dL — AB (ref 8.9–10.3)
CO2: 26 mmol/L (ref 22–32)
Chloride: 102 mmol/L (ref 101–111)
Creatinine, Ser: 1.98 mg/dL — ABNORMAL HIGH (ref 0.44–1.00)
GFR, EST AFRICAN AMERICAN: 26 mL/min — AB (ref >60)
GFR, EST NON AFRICAN AMERICAN: 22 mL/min — AB (ref >60)
GLUCOSE: 95 mg/dL (ref 65–99)
Potassium: 4.1 mmol/L (ref 3.5–5.1)
SODIUM: 137 mmol/L (ref 135–145)

## 2016-01-08 MED ORDER — BISACODYL 10 MG RE SUPP
10.0000 mg | Freq: Every day | RECTAL | Status: AC
  Administered 2016-01-08 – 2016-01-09 (×2): 10 mg via RECTAL
  Filled 2016-01-08 (×2): qty 1

## 2016-01-08 MED ORDER — DEXTROSE 5 % IV SOLN
1.0000 g | INTRAVENOUS | Status: DC
  Administered 2016-01-08 – 2016-01-10 (×3): 1 g via INTRAVENOUS
  Filled 2016-01-08 (×4): qty 10

## 2016-01-08 MED ORDER — POLYETHYLENE GLYCOL 3350 17 G PO PACK
17.0000 g | PACK | Freq: Every day | ORAL | Status: DC
  Administered 2016-01-08 – 2016-01-11 (×3): 17 g via ORAL
  Filled 2016-01-08 (×3): qty 1

## 2016-01-08 NOTE — Clinical Social Work Placement (Signed)
   CLINICAL SOCIAL WORK PLACEMENT  NOTE  Date:  01/08/2016  Patient Details  Name: [name] [name] MRN: [id number] Date of Birth: [birth year]  Clinical Social Work is seeking post-discharge placement for this patient at the [place] level of care (*CSW will initial, date and re-position this form in  chart as items are completed):  Yes   Patient/family provided with [place] Work Department's list of facilities offering this level of care within the geographic area requested by the patient (or if unable, by the patient's family).  Yes   Patient/family informed of their freedom to choose among providers that offer the needed level of care, that participate in Medicare, Medicaid or managed care program needed by the patient, have an available bed and are willing to accept the patient.  Yes   Patient/family informed of [place]'s ownership interest in [facility] and [facility], as well as of the fact that they are under no obligation to receive care at these facilities.  PASRR submitted to EDS on 01/08/16     PASRR number received on 01/08/16     Existing PASRR number confirmed on       FL2 transmitted to all facilities in geographic area requested by pt/family on 01/08/16     FL2 transmitted to all facilities within larger geographic area on       Patient informed that his/her managed care company has contracts with or will negotiate with certain facilities, including the following:            Patient/family informed of bed offers received.  Patient chooses bed at       Physician recommends and patient chooses bed at      Patient to be transferred to   on  .  Patient to be transferred to facility by       Patient family notified on   of transfer.  Name of family member notified:        PHYSICIAN       Additional Comment:    _______________________________________________ Loraine Maple   [phone] 01/08/2016,  3:41 PM

## 2016-01-08 NOTE — Telephone Encounter (Signed)
Called & spoke with daughter, Olin Hauser & informed to let us know if she needs Dr Burr Medico to see her in the hospital & otherwise to call when she is out to r/s missed appts.

## 2016-01-08 NOTE — Progress Notes (Signed)
OT Cancellation Note  Patient Details Name: ABRISH ERNY MRN: [id number] DOB: 15-Jan-1932   Cancelled Treatment:    Reason Eval/Treat Not Completed: Other (comment)  Spoke with PT who is recommending SNF.  Will defer OT eval to SNF  [place], Mickel Baas, [place] [phone] 01/08/2016, 12:33 PM

## 2016-01-08 NOTE — Clinical Social Work Note (Signed)
Clinical Social Work Assessment  Patient Details  Name: [name] [name] MRN: [id number] Date of Birth: 05-Jan-1932  Date of referral:  01/07/16               Reason for consult:  Discharge Planning                Permission sought to share information with:  Chartered certified accountant granted to share information::  Yes, Verbal Permission Granted  Name::        Agency::     Relationship::     Contact Information:     Housing/Transportation Living arrangements for the past 2 months:  Single Family Home Source of Information:  Patient Patient Interpreter Needed:  None Criminal Activity/Legal Involvement Pertinent to Current Situation/Hospitalization:  No - Comment as needed Significant Relationships:  Adult Children Lives with:  Self Do you feel safe going back to the place where you live?  No (SNF recommended.) Need for family participation in patient care:  Yes (Comment)  Care giving concerns: Pt's care cannot be managed at home following hospital d/c.    Social Worker assessment / plan: Pt hospitalized on 01/07/16 from home under observation status with a pubic ramus fx and possible UTI. Pt's status was changed to " in pt " today. PT has recommended SNF at d/c. CSW met with pt / daughter to assist with d/c planning. Pt / daughter are in agreement with [facility] placement. SNF search has been initiated and bed offers are pending. CSW will continue to follow to assist with d/c planning needs.  Employment status:  Retired Forensic scientist:  Medicare PT Recommendations:  [place] / Referral to community resources:  [place]  Patient/Family's Response to care: Pt / daughter feel SNF placement is needed.  Patient/Family's Understanding of and Emotional Response to Diagnosis, Current Treatment, and Prognosis:  Pt / daughter are aware of pt's medical status. " I feel awful ever time I move. " Pt / daughter appreciate CSW  assistance with d/c planning.  Emotional Assessment Appearance:  Appears stated age Attitude/Demeanor/Rapport:   (cooperative) Affect (typically observed):  Anxious Orientation:  Oriented to Self, Oriented to Place, Oriented to  Time, Oriented to Situation Alcohol / Substance use:  Not Applicable Psych involvement (Current and /or in the community):  No (Comment)  Discharge Needs  Concerns to be addressed:  Discharge Planning Concerns Readmission within the last 30 days:  No Current discharge risk:  None Barriers to Discharge:  No Barriers Identified   Luretha Rued, [place] 01/08/2016, 2:55 PM

## 2016-01-08 NOTE — Evaluation (Signed)
Physical Therapy Evaluation Patient Details Name: [name] [name] MRN: [id number] DOB: [birth year] Today's Date: 01/08/2016   History of Present Illness  [name] [name] is a 82 y.o. female with pubic ramus fx; medical history significant of an STEMI status post CABG, CAD, paroxysmal atrial fibrillation, anxiety/depression, hypothyroidism, CKD stage IV.  Clinical Impression  Pt admitted with above diagnosis. Pt currently with functional limitations due to the deficits listed below (see PT Problem List). * Pt will benefit from skilled PT to increase their independence and safety with mobility to allow discharge to the venue listed below.   Orthostatics checked, see flowsheets for details (pt was not orthostatic) although did later c/o nausea and dizziness after transfers; recommend SNF, pt has limited caregiver support    Follow Up Recommendations SNF    Equipment Recommendations  None recommended by PT    Recommendations for Other Services       Precautions / Restrictions Precautions Precautions: Fall Restrictions Weight Bearing Restrictions: No RLE Weight Bearing: Weight bearing as tolerated      Mobility  Bed Mobility Overal bed mobility: Needs Assistance Bed Mobility: Rolling;Sidelying to Sit Rolling: Min assist Sidelying to sit: Min assist       General bed mobility comments: cues for self assist  Transfers Overall transfer level: Needs assistance Equipment used: Rolling walker (2 wheeled) Transfers: Sit to/from [facility] Sit to Stand: Min assist Stand pivot transfers: Min assist       General transfer comment: cues for hand placement, overall safety  Ambulation/Gait Ambulation/Gait assistance: Min assist Ambulation Distance (Feet): 3 Feet Assistive device: Rolling walker (2 wheeled) Gait Pattern/deviations: Step-to pattern     General Gait Details: pivotal steps to [facility], incr time, cues for sequencing  Stairs            Wheelchair  Mobility    Modified Rankin (Stroke Patients Only)       Balance Overall balance assessment: Needs assistance;History of Falls Sitting-balance support: Bilateral upper extremity supported;Feet supported Sitting balance-Leahy Scale: Fair Sitting balance - Comments: difficulty maintaining sitting without UE support   Standing balance support: Bilateral upper extremity supported;During functional activity Standing balance-Leahy Scale: Poor Standing balance comment: heavily reliant on UEs and external support for safe standing                             Pertinent Vitals/Pain Pain Assessment: 0-10 Pain Score: 9  Pain Location: hips, groin Pain Descriptors / Indicators: Aching;Sore Pain Intervention(s): Limited activity within patient's tolerance;Monitored during session;Premedicated before session    [place] expects to be discharged to:: Private residence Living Arrangements: Alone Available Help at Discharge: Family Type of Home: House Home Access: Stairs to enter   [employer] of Steps: 3-4 Home Layout: One level;Able to live on main level with bedroom/bathroom;Laundry or work area in [place]: Environmental consultant - 2 wheels;Bedside commode      Prior Function Level of Independence: Independent with assistive device(s);Independent         Comments: using walker occaisonallly; furniture walks      Hand Dominance        Extremity/Trunk Assessment   Upper Extremity Assessment Upper Extremity Assessment: Defer to OT evaluation;Generalized weakness    Lower Extremity Assessment Lower Extremity Assessment: Generalized weakness       Communication   Communication: No difficulties  Cognition Arousal/Alertness: Awake/alert Behavior During Therapy: WFL for tasks assessed/performed Overall Cognitive Status: Within Functional Limits for  tasks assessed                 General Comments: pt requires frequent  redirection to task, tangential at times    General Comments      Exercises     Assessment/Plan    PT Assessment Patient needs continued PT services  PT Problem List Decreased strength;Decreased activity tolerance;Decreased balance;Pain;Decreased knowledge of use of DME;Decreased mobility          PT Treatment Interventions DME instruction;Gait training;Functional mobility training;Therapeutic exercise;Therapeutic activities;Patient/family education    PT Goals (Current goals can be found in the Care Plan section)  Acute Rehab PT Goals Patient Stated Goal: less pain when moving  PT Goal Formulation: With patient Time For Goal Achievement: 01/22/16 Potential to Achieve Goals: Good    Frequency Min 3X/week   Barriers to discharge        Co-evaluation               End of Session Equipment Utilized During Treatment: Gait belt Activity Tolerance: Patient tolerated treatment well;Patient limited by pain Patient left: Other (comment);with nursing/sitter in room [facility]) Nurse Communication: Mobility status         Time: 3568-6168 PT Time Calculation (min) (ACUTE ONLY): 38 min   Charges:   PT Evaluation $PT Eval Moderate Complexity: 1 Procedure PT Treatments $Therapeutic Activity: 23-37 mins   PT G Codes:        [place] 25-Jan-2016, 11:29 AM

## 2016-01-08 NOTE — NC FL2 (Signed)
[place] LEVEL OF CARE SCREENING TOOL     IDENTIFICATION  Patient Name: [name] [name] Birthdate: [birth year] Sex: female Admission Date (Current Location): 01/07/2016  [facility] and [place] Number:  Herbalist and Address:  [facility],  [place] [address], [place]      Provider Number: [id number]  Attending Physician Name and Address:  Florencia Reasons, MD  Relative Name and Phone Number:       Current Level of Care: Hospital Recommended Level of Care: [place] Prior Approval Number:    Date Approved/Denied:   PASRR Number: [id number]  Discharge Plan: SNF    Current Diagnoses: Patient Active Problem List   Diagnosis Date Noted  . Pubic ramus fracture ([place]) 01/07/2016  . Neck pain, musculoskeletal 08/10/2015  . Gout 06/06/2015  . Anemia 03/20/2015  . Piriformis syndrome of left side 12/13/2014  . Osteoporosis 09/14/2014  . Compression fracture of T12 vertebra with delayed healing 05/04/2014  . Poor tolerance for ambulation   . Back pain   . Decreased ambulation status 05/02/2014  . Left-sided low back pain with sciatica 05/02/2014  . CKD (chronic kidney disease) stage 4, GFR 15-29 ml/min (HCC) 05/02/2014  . Situational depression 10/26/2013  . Pes anserine bursitis 10/05/2013  . Alopecia 10/01/2013  . Primary localized osteoarthrosis, lower leg 09/07/2013  . Left leg weakness 08/25/2013  . Fall against object 08/25/2013  . Left ankle pain 08/25/2013  . Thyroid nodule 04/27/2013  . CHF (congestive heart failure) ([place]) 04/20/2013  . PNA (pneumonia) 04/20/2013  . A-fib ([place]) 04/18/2013  . Nausea alone 04/16/2013  . Unspecified constipation 04/16/2013  . Unspecified hypothyroidism 04/16/2013  . Cough 04/16/2013  . Disuse syndrome 03/15/2013  . Influenza due to influenza A virus 03/07/2013  . Acute non-ST segment elevation myocardial infarction ([place]) 03/06/2013  . Cramps, extremity 07/01/2012  . Minimal  cognitive impairment 10/07/2011  . Restless leg 09/16/2011  . Neuropathy ([place]) 08/16/2011  . Anemia in chronic renal disease 12/08/2010  . BRONCHITIS NOT SPECIFIED AS ACUTE OR CHRONIC 03/20/2009  . Bilateral carotid bruits 01/12/2009  . Coronary atherosclerosis 10/21/2008  . PANCREATITIS 10/21/2008  . Disorder resulting from impaired renal function 10/21/2008  . OSTEOMYELITIS 10/21/2008  . Diverticulosis of colon with hemorrhage 09/20/2008  . DEHYDRATION (VOLUME DEPLETION) 04/11/2008  . SYNCOPE 04/11/2008  . UNSPECIFIED DISORDER OF URETHRA&URINARY TRACT 02/04/2008  . Vitamin D deficiency 12/28/2007  . Asthma 10/28/2007  . PYELONEPHRITIS 10/28/2007  . Fatigue 10/28/2007  . Dyslipidemia 06/23/2007  . Anxiety state 06/23/2007  . INSOMNIA, PERSISTENT 06/23/2007  . Essential hypertension 06/23/2007  . PERIPHERAL VASCULAR DISEASE 06/23/2007  . GERD 06/23/2007  . DIVERTICULOSIS, COLON 06/23/2007  . LOW BACK PAIN 06/23/2007    Orientation RESPIRATION BLADDER Height & Weight     Self, Time, Situation, Place  Normal Continent Weight: 173 lb 4.5 oz (78.6 kg) Height:  5\' 3"  (160 cm)  BEHAVIORAL SYMPTOMS/MOOD NEUROLOGICAL BOWEL NUTRITION STATUS  Other (Comment) (no behaviors)   Continent Diet  AMBULATORY STATUS COMMUNICATION OF NEEDS Skin   Limited Assist Verbally Normal                       Personal Care Assistance Level of Assistance  Bathing, Feeding, Dressing Bathing Assistance: Limited assistance Feeding assistance: Independent Dressing Assistance: Limited assistance     Functional Limitations Info  Sight, Hearing, Speech Sight Info: Adequate Hearing Info: Adequate Speech Info: Adequate    SPECIAL CARE FACTORS FREQUENCY  PT (By licensed PT), OT (By licensed OT)     PT Frequency: 5x wk OT Frequency: 5x wk            Contractures Contractures Info: Not present    Additional Factors Info  Code Status, Allergies, Psychotropic Code Status Info: Full Code              Current Medications (01/08/2016):  This is the current hospital active medication list Current Facility-Administered Medications  Medication Dose Route Frequency Provider Last Rate Last Dose  . acetaminophen (TYLENOL) tablet 650 mg  650 mg Oral Q6H PRN Mariel Aloe, MD       Or  . acetaminophen (TYLENOL) suppository 650 mg  650 mg Rectal Q6H PRN Mariel Aloe, MD      . albuterol (PROVENTIL) (2.5 MG/3ML) 0.083% nebulizer solution 2.5 mg  2.5 mg Nebulization Q6H PRN Mariel Aloe, MD      . aspirin EC tablet 81 mg  81 mg Oral Daily Mariel Aloe, MD   81 mg at 01/08/16 0912  . atorvastatin (LIPITOR) tablet 40 mg  40 mg Oral QODAY Mariel Aloe, MD   40 mg at 01/08/16 0912  . B-complex with vitamin C tablet 1 tablet  1 tablet Oral Daily Mariel Aloe, MD   1 tablet at 01/08/16 0912  . bisacodyl (DULCOLAX) suppository 10 mg  10 mg Rectal Daily Florencia Reasons, MD      . cefTRIAXone (ROCEPHIN) 1 g in dextrose 5 % 50 mL IVPB  1 g Intravenous Q24H Florencia Reasons, MD   1 g at 01/08/16 1223  . cholecalciferol (VITAMIN D) tablet 1,000 Units  1,000 Units Oral Daily Mariel Aloe, MD   1,000 Units at 01/08/16 0912  . enoxaparin (LOVENOX) injection 30 mg  30 mg Subcutaneous Q24H Mariel Aloe, MD   30 mg at 01/07/16 1949  . escitalopram (LEXAPRO) tablet 5 mg  5 mg Oral Daily Mariel Aloe, MD   5 mg at 01/08/16 0912  . HYDROcodone-acetaminophen (NORCO/VICODIN) 5-325 MG per tablet 1-2 tablet  1-2 tablet Oral Q4H PRN Mariel Aloe, MD   1 tablet at 01/08/16 0912  . levothyroxine (SYNTHROID, LEVOTHROID) tablet 50 mcg  50 mcg Oral QAC breakfast Mariel Aloe, MD   50 mcg at 01/08/16 0741  . metoprolol tartrate (LOPRESSOR) tablet 25 mg  25 mg Oral BID Mariel Aloe, MD   25 mg at 01/08/16 0912  . morphine 4 MG/ML injection 4 mg  4 mg Intravenous Q4H PRN Mariel Aloe, MD   4 mg at 01/07/16 1511  . ondansetron (ZOFRAN) tablet 4 mg  4 mg Oral Q6H PRN Mariel Aloe, MD       Or  . ondansetron  [facility]) injection 4 mg  4 mg Intravenous Q6H PRN Mariel Aloe, MD   4 mg at 01/08/16 1130  . polyethylene glycol (MIRALAX / GLYCOLAX) packet 17 g  17 g Oral Daily PRN Mariel Aloe, MD      . polyethylene glycol (MIRALAX / GLYCOLAX) packet 17 g  17 g Oral Daily Florencia Reasons, MD   17 g at 01/08/16 1223  . senna-docusate (Senokot-S) tablet 1 tablet  1 tablet Oral Daily Mariel Aloe, MD   1 tablet at 01/08/16 0912   Facility-Administered Medications Ordered in Other Encounters  Medication Dose Route Frequency Provider Last Rate Last Dose  . Darbepoetin Alfa (ARANESP) injection 500 mcg  500 mcg Subcutaneous  Once [name], MD         Discharge Medications: Please see discharge summary for a list of discharge medications.  Relevant Imaging Results:  Relevant Lab Results:   Additional Information SS # [id number]  Haidinger, Randall An, LCSW

## 2016-01-08 NOTE — Telephone Encounter (Signed)
Pt's daughter called to cancel her mom's appts due to the pt being in the hospital.  I cancelled the appts as requested and called Dr. Ernestina Penna nurse and left a message on the voicemail for them to f/u with a new appt for the pt after she gets out of the hospital.

## 2016-01-08 NOTE — Progress Notes (Signed)
PROGRESS NOTE  [name] [name] YJE:[id number] DOB: 01/17/1932 DOA: 01/07/2016 PCP: [name], MD  HPI/Recap of past 24 hours:  S/p right sided pain and progressive weakness  Assessment/Plan: Principal Problem:   Pubic ramus fracture (HCC) Active Problems:   Anxiety state   Essential hypertension   Anemia in chronic renal disease   A-fib (HCC)   CHF (congestive heart failure) (HCC)   CKD (chronic kidney disease) stage 4, GFR 15-29 ml/min (HCC)  UTI: US showed + bacteria with large leukocytes and TNTC wbc. She report dark urine and weakness. No fever, no leukocytosis, urine culture pending, continue iv rocephin.  FTT with fall in September 2017, walks with a walker, no with suprapubic rami fracture, not able to ambulate. PT, SNF placement   Nondisplaced pubic fracture Questionably seen on CT but correlates with area of pain. Patient unable to ambulate secondary to pain. Although she did not report recent fall -Norco -morphine for breakthrough -miralax -Senokot-S -physical therapy -occupational therapy  Back pain Patient has significant arthritis with stenosis present. She is s/p kyphoplasty. -PT/OT as above -analgesics as above   Paroxysmal trial fibrillation Per cardiology: In setting post CABG NSR asa/ lopresser no anticoagulation -continue metoprolol, sinus rhythm.  CKD stage IV Stable  Hypothyroidism -continue synthroid  GERD -hold Protonix. Consider discontinuing at discharge secondary to osteoporosis  Osteoporosis Recent compression fracture. On vitamin D -continue vitamin D  Depression/anxiety -continue Lexapro  Anemia of chronic disease Stable. Follows up with hematology oncology -continue Aranesp as an outpatient  Hyperlipidemia -continue atorvastatin  Systolic congestive heart failure Last EF of 45-50% in 2015. Euvolemic. -continue furosemide -continue metoprolol as above -daily weights  CAD/CABG Carotid artery  stenosis -continue aspirin   DVT prophylaxis: Lovenox Code Status: Full code Family Communication: patient Disposition Plan: SNF  Consults called: None   Procedures:  none  Antibiotics:  rocephin   Objective: BP (!) 114/51 (BP Location: Right Arm)   Pulse 91   Temp 99.8 F (37.7 C) (Oral)   Resp 16   Ht 5\' 3"  (1.6 m)   Wt 78.6 kg (173 lb 4.5 oz)   SpO2 95%   BMI 30.70 kg/m   Intake/Output Summary (Last 24 hours) at 01/08/16 1104 Last data filed at 01/08/16 0848  Gross per 24 hour  Intake              600 ml  Output              800 ml  Net             -200 ml   Filed Weights   01/07/16 0935 01/07/16 1503 01/08/16 0639  Weight: 74.4 kg (164 lb) 74.4 kg (164 lb) 78.6 kg (173 lb 4.5 oz)    Exam:   General:  Frail, frustrated  Cardiovascular: RRR  Respiratory: CTABL  Abdomen: Soft/ND/NT, positive BS  Musculoskeletal: No Edema, tenderness along right hip and right back near sacrum, limited range of motion right lower extremity due to pain  Neuro: aaox3, anxious, frustrated  Data Reviewed: Basic Metabolic Panel:  Recent Labs Lab 01/07/16 1034 01/08/16 0415  NA 138 137  K 3.8 4.1  CL 103 102  CO2 26 26  GLUCOSE 103* 95  BUN 38* 33*  CREATININE 1.79* 1.98*  CALCIUM 8.9 8.8*   Liver Function Tests: No results for input(s): AST, ALT, ALKPHOS, BILITOT, PROT, ALBUMIN in the last 168 hours. No results for input(s): LIPASE, AMYLASE in the last 168 hours. No results for  input(s): AMMONIA in the last 168 hours. CBC:  Recent Labs Lab 01/07/16 1034 01/08/16 0415  WBC 8.4 7.3  NEUTROABS 6.4  --   HGB 9.9* 9.4*  HCT 28.9* 27.8*  MCV 90.3 91.7  PLT 332 329   Cardiac Enzymes:   No results for input(s): CKTOTAL, CKMB, CKMBINDEX, TROPONINI in the last 168 hours. BNP (last 3 results) No results for input(s): BNP in the last 8760 hours.  ProBNP (last 3 results) No results for input(s): PROBNP in the last 8760 hours.  CBG: No results for  input(s): GLUCAP in the last 168 hours.  No results found for this or any previous visit (from the past 240 hour(s)).   Studies: Ct Lumbar Spine Wo Contrast  Result Date: 01/07/2016 CLINICAL DATA:  Worsening pain in the right hip. Injured turning over and head. EXAM: CT LUMBAR SPINE WITHOUT CONTRAST TECHNIQUE: Multidetector CT imaging of the lumbar spine was performed without intravenous contrast administration. Multiplanar CT image reconstructions were also generated. COMPARISON:  10/21/2015 FINDINGS: Segmentation: 5 lumbar type vertebral bodies. Alignment: No significant malalignment. Vertebrae: There is a healing partial compression fracture of T11 which was acute on the study of 10/21/2015. Loss of height is 10% or less. Solid bone fusion noted from T10 through T12. Old augmented fracture at T12 which has not changed. Old inferior endplate fracture/ Schmorl's node at L1 which has not changed. Old inferior endplate fracture at L2 has not changed. Old biconcave fracture at L3 has not changed. Minimal superior endplate depression at L4 has not changed. Minimal endplate deformities at L5 narrow but not changed. No evidence of sacral fracture. Paraspinal and other soft tissues: Aortic atherosclerosis. Otherwise negative. Disc levels: L1-2: Retrolisthesis of 3 mm. Mild stenosis of the lateral recesses and foramina. L2-3: Disc bulge and posterior bowing of the posterosuperior endplate of L3. Mild stenosis of both lateral recesses. L3-4: Disc bulge. Facet hypertrophy. Mild stenosis of both lateral recesses. L4-5: Disc bulge. Mild facet hypertrophy. Mild stenosis of both lateral recesses. L5-S1: Endplate osteophytes and mild bulging of the disc. No central canal stenosis. Mild foraminal stenosis because of osteophytic encroachment right more than left. IMPRESSION: No acute fractures seen today. There is a subacute, healing fracture at T11 with loss of height of only 10% or less. This was acute in September. Old  augmented fracture at T12. Old un augmented fractures at the other lumbar levels as outlined above. Electronically Signed   By: Nelson Chimes M.D.   On: 01/07/2016 11:28    Scheduled Meds: . aspirin EC  81 mg Oral Daily  . atorvastatin  40 mg Oral QODAY  . B-complex with vitamin C  1 tablet Oral Daily  . cefTRIAXone (ROCEPHIN)  IV  1 g Intravenous Q24H  . cholecalciferol  1,000 Units Oral Daily  . enoxaparin (LOVENOX) injection  30 mg Subcutaneous Q24H  . escitalopram  5 mg Oral Daily  . levothyroxine  50 mcg Oral QAC breakfast  . metoprolol tartrate  25 mg Oral BID  . senna-docusate  1 tablet Oral Daily    Continuous Infusions:   Time spent: 54mins  Fredda Clarida MD, PhD  [facility] Pager [phone]. If 7PM-7AM, please contact night-coverage at [email or web address], password [facility] 01/08/2016, 11:04 AM  LOS: 0 days

## 2016-01-09 MED ORDER — SODIUM CHLORIDE 0.9 % IV SOLN
INTRAVENOUS | Status: DC
  Administered 2016-01-09: 11:00:00 via INTRAVENOUS

## 2016-01-09 MED ORDER — FAMOTIDINE 20 MG PO TABS
10.0000 mg | ORAL_TABLET | Freq: Every day | ORAL | Status: DC
  Administered 2016-01-09 – 2016-01-10 (×2): 10 mg via ORAL
  Filled 2016-01-09 (×2): qty 1

## 2016-01-09 MED ORDER — FUROSEMIDE 20 MG PO TABS: 20.0000 mg | ORAL_TABLET | Freq: Every day | ORAL | 0 refills | Status: DC | PRN

## 2016-01-09 MED ORDER — FAMOTIDINE 10 MG PO TABS: 10.0000 mg | ORAL_TABLET | Freq: Every day | ORAL | 0 refills | Status: DC

## 2016-01-09 NOTE — Progress Notes (Signed)
CSW met with pt and spoke with pt's daughter Gilmore Laroche [phone], to provide SNF bed offers. Pt / family are considering options. CSW will continue to follow to assist with d/c planning needs.  Werner Lean LCSW [phone]

## 2016-01-09 NOTE — Progress Notes (Addendum)
PROGRESS NOTE  [name] [name] KWI:[id number] DOB: 03-26-1931 DOA: 01/07/2016 PCP: [name], MD  Brief Summary:  H/o CAD s/pCABG, osteoporosis, Here with progressive weakness, not able to get out of bed in the morning, found to have a pubic ramus fracture,   uti on rocephin.  snf on 12/21.    HPI/Recap of past 24 hours:  Feeling better  Assessment/Plan: Principal Problem:   Pubic ramus fracture (HCC) Active Problems:   Anxiety state   Essential hypertension   Anemia in chronic renal disease   A-fib (HCC)   CHF (congestive heart failure) (HCC)   CKD (chronic kidney disease) stage 4, GFR 15-29 ml/min (HCC)  UTI: US showed + bacteria with large leukocytes and TNTC wbc. She report dark urine and weakness. No fever, no leukocytosis, urine culture pending, continue iv rocephin.  FTT with fall in September 2017, walks with a walker, now with nondisplaced suprapubic rami fracture, not able to ambulate. Pan control, PT, SNF placement  Back pain CT lumbar spine did show old T11 and t12 fracture, but no acute fracture Pain control, -PT/OT  Paroxysmal trial fibrillation Per cardiology:paf In the setting post CABG, NSR on asa/ lopresser, does not need to be on anticoagulation -continue metoprolol, sinus rhythm.  CKD stage IV Stable  Hypothyroidism -continue synthroid -patient report taking biotin, this could potentially affect TSH testing  GERD -d/c Protonix secondary to osteoporosis -change to pepcid  Osteoporosis compression fracture in 09/2015.  -continue vitamin D  Depression/anxiety -continue Lexapro  Anemia of chronic disease Stable. Follows up with hematology oncology -continue Aranesp as an outpatient  Hyperlipidemia -continue atorvastatin  Systolic congestive heart failure Last EF of 45-50% in 2015. Euvolemic. - she look dehydrated on presentation, furosemide held, she received gentle hydration, now off ivf. I have discussed with patient  and family, they agreed to change lasix to prn daily for edema -continue metoprolol as above -daily weights  CAD/CABG Carotid artery stenosis -continue aspirin  FTT;  per family patient worked at [employer] until three years ago after the bypass surgery. She has been living by herself. She has gotten progressively  Weaker, they agreed to SNF for now  DVT prophylaxis while in the hospital: Lovenox Code Status: Full code Family Communication: patient and daughter at bedside on 12/19 Disposition Plan: SNF on Thursday 12/21 Consults called: None   Procedures:  none  Antibiotics:  rocephin   Objective: BP (!) 124/57 (BP Location: Left Arm)   Pulse 72   Temp 98.2 F (36.8 C) (Oral)   Resp 15   Ht 5\' 3"  (1.6 m)   Wt 74.4 kg (164 lb)   SpO2 93%   BMI 29.05 kg/m   Intake/Output Summary (Last 24 hours) at 01/09/16 0837 Last data filed at 01/09/16 2426  Gross per 24 hour  Intake              840 ml  Output             1550 ml  Net             -710 ml   Filed Weights   01/07/16 1503 01/08/16 0639 01/09/16 0500  Weight: 74.4 kg (164 lb) 78.6 kg (173 lb 4.5 oz) 74.4 kg (164 lb)    Exam:   General:  Frail,   Cardiovascular: RRR  Respiratory: CTABL  Abdomen: Soft/ND/NT, positive BS  Musculoskeletal: No Edema, tenderness along right hip and right back near sacrum, limited range of motion right lower extremity due  to pain  Neuro: aaox3, anxious,   Data Reviewed: Basic Metabolic Panel:  Recent Labs Lab 01/07/16 1034 01/08/16 0415  NA 138 137  K 3.8 4.1  CL 103 102  CO2 26 26  GLUCOSE 103* 95  BUN 38* 33*  CREATININE 1.79* 1.98*  CALCIUM 8.9 8.8*   Liver Function Tests: No results for input(s): AST, ALT, ALKPHOS, BILITOT, PROT, ALBUMIN in the last 168 hours. No results for input(s): LIPASE, AMYLASE in the last 168 hours. No results for input(s): AMMONIA in the last 168 hours. CBC:  Recent Labs Lab 01/07/16 1034 01/08/16 0415  WBC 8.4 7.3    NEUTROABS 6.4  --   HGB 9.9* 9.4*  HCT 28.9* 27.8*  MCV 90.3 91.7  PLT 332 329   Cardiac Enzymes:   No results for input(s): CKTOTAL, CKMB, CKMBINDEX, TROPONINI in the last 168 hours. BNP (last 3 results) No results for input(s): BNP in the last 8760 hours.  ProBNP (last 3 results) No results for input(s): PROBNP in the last 8760 hours.  CBG: No results for input(s): GLUCAP in the last 168 hours.  No results found for this or any previous visit (from the past 240 hour(s)).   Studies: No results found.  Scheduled Meds: . aspirin EC  81 mg Oral Daily  . atorvastatin  40 mg Oral QODAY  . B-complex with vitamin C  1 tablet Oral Daily  . bisacodyl  10 mg Rectal Daily  . cefTRIAXone (ROCEPHIN)  IV  1 g Intravenous Q24H  . cholecalciferol  1,000 Units Oral Daily  . enoxaparin (LOVENOX) injection  30 mg Subcutaneous Q24H  . escitalopram  5 mg Oral Daily  . levothyroxine  50 mcg Oral QAC breakfast  . metoprolol tartrate  25 mg Oral BID  . polyethylene glycol  17 g Oral Daily  . senna-docusate  1 tablet Oral Daily    Continuous Infusions: . sodium chloride       Time spent: 39mins  Laury Huizar MD, PhD  [facility] Pager [phone]. If 7PM-7AM, please contact night-coverage at [email or web address], password [facility] 01/09/2016, 8:37 AM  LOS: 1 day

## 2016-01-10 DIAGNOSIS — N3000 Acute cystitis without hematuria: Secondary | ICD-10-CM

## 2016-01-10 DIAGNOSIS — I480 Paroxysmal atrial fibrillation: Secondary | ICD-10-CM

## 2016-01-10 DIAGNOSIS — F411 Generalized anxiety disorder: Secondary | ICD-10-CM

## 2016-01-10 DIAGNOSIS — R531 Weakness: Secondary | ICD-10-CM

## 2016-01-10 DIAGNOSIS — N184 Chronic kidney disease, stage 4 (severe): Secondary | ICD-10-CM

## 2016-01-10 DIAGNOSIS — I10 Essential (primary) hypertension: Secondary | ICD-10-CM

## 2016-01-10 DIAGNOSIS — S32591A Other specified fracture of right pubis, initial encounter for closed fracture: Secondary | ICD-10-CM

## 2016-01-10 DIAGNOSIS — R5381 Other malaise: Secondary | ICD-10-CM

## 2016-01-10 DIAGNOSIS — D631 Anemia in chronic kidney disease: Secondary | ICD-10-CM

## 2016-01-10 DIAGNOSIS — I5020 Unspecified systolic (congestive) heart failure: Secondary | ICD-10-CM

## 2016-01-10 LAB — BASIC METABOLIC PANEL
Anion gap: 8 (ref 5–15)
BUN: 36 mg/dL — ABNORMAL HIGH (ref 6–20)
CALCIUM: 8.9 mg/dL (ref 8.9–10.3)
CHLORIDE: 100 mmol/L — AB (ref 101–111)
CO2: 26 mmol/L (ref 22–32)
CREATININE: 1.89 mg/dL — AB (ref 0.44–1.00)
GFR calc non Af Amer: 24 mL/min — ABNORMAL LOW (ref >60)
GFR, EST AFRICAN AMERICAN: 27 mL/min — AB (ref >60)
Glucose, Bld: 93 mg/dL (ref 65–99)
Potassium: 3.9 mmol/L (ref 3.5–5.1)
SODIUM: 134 mmol/L — AB (ref 135–145)

## 2016-01-10 LAB — CBC WITH DIFFERENTIAL/PLATELET
BASOS ABS: 0 10*3/uL (ref 0.0–0.1)
BASOS PCT: 0 %
EOS ABS: 0.3 10*3/uL (ref 0.0–0.7)
Eosinophils Relative: 3 %
HCT: 26.5 % — ABNORMAL LOW (ref 36.0–46.0)
Hemoglobin: 9.1 g/dL — ABNORMAL LOW (ref 12.0–15.0)
Lymphocytes Relative: 21 %
Lymphs Abs: 1.9 10*3/uL (ref 0.7–4.0)
MCH: 30.1 pg (ref 26.0–34.0)
MCHC: 34.3 g/dL (ref 30.0–36.0)
MCV: 87.7 fL (ref 78.0–100.0)
MONOS PCT: 8 %
Monocytes Absolute: 0.7 10*3/uL (ref 0.1–1.0)
NEUTROS PCT: 68 %
Neutro Abs: 5.9 10*3/uL (ref 1.7–7.7)
Platelets: 343 10*3/uL (ref 150–400)
RBC: 3.02 MIL/uL — ABNORMAL LOW (ref 3.87–5.11)
RDW: 12.7 % (ref 11.5–15.5)
WBC: 8.8 10*3/uL (ref 4.0–10.5)

## 2016-01-10 NOTE — Progress Notes (Signed)
CSW assisting with d/c planning. [facility] will have SNF bed available for pt on Thursday 12/21 if stable for d/c. CSW will continue to follow to assist with d/c planning needs.  Werner Lean LCSW [phone]

## 2016-01-10 NOTE — Progress Notes (Signed)
Physical Therapy Treatment Patient Details Name: [name] [name] MRN: [id number] DOB: [birth year] Today's Date: 01/10/2016    History of Present Illness [name] [name] is a 82 y.o. female with pubic ramus fx; medical history significant of an STEMI status post CABG, CAD, paroxysmal atrial fibrillation, anxiety/depression, hypothyroidism, CKD stage IV.    PT Comments    Marked improvement in activity tolerance but increased time required for all movement 2* pain  Follow Up Recommendations  SNF     Equipment Recommendations  None recommended by PT    Recommendations for Other Services       Precautions / Restrictions Precautions Precautions: Fall Restrictions Weight Bearing Restrictions: No RLE Weight Bearing: Weight bearing as tolerated    Mobility  Bed Mobility Overal bed mobility: Needs Assistance Bed Mobility: Rolling;Sidelying to Sit Rolling: Min guard Sidelying to sit: Min guard       General bed mobility comments: pt utilizing bed rail  Transfers Overall transfer level: Needs assistance Equipment used: Rolling walker (2 wheeled) Transfers: Sit to/from Stand Sit to Stand: Min assist         General transfer comment: cues for hand placement, overall safety  Ambulation/Gait Ambulation/Gait assistance: Min assist Ambulation Distance (Feet): 75 Feet (twice) Assistive device: Rolling walker (2 wheeled) Gait Pattern/deviations: Step-through pattern;Decreased step length - right;Decreased step length - left;Shuffle;Trunk flexed Gait velocity: decr Gait velocity interpretation: Below normal speed for age/gender General Gait Details: Increased time, cues for posture, position from RW, multiple short standing rests   Stairs            Wheelchair Mobility    Modified Rankin (Stroke Patients Only)       Balance Overall balance assessment: Needs assistance;History of Falls Sitting-balance support: No upper extremity supported;Feet supported Sitting  balance-Leahy Scale: Fair     Standing balance support: Bilateral upper extremity supported;During functional activity Standing balance-Leahy Scale: Poor                      Cognition Arousal/Alertness: Awake/alert Behavior During Therapy: WFL for tasks assessed/performed Overall Cognitive Status: Within Functional Limits for tasks assessed                 General Comments: pt requires frequent redirection to task, tangential at times    Exercises      General Comments        Pertinent Vitals/Pain Pain Assessment: 0-10 Pain Score: 6  Pain Location: R hip/LE with movement Pain Descriptors / Indicators: Aching;Sore Pain Intervention(s): Limited activity within patient's tolerance;Monitored during session;Premedicated before session    Home Living                      Prior Function            PT Goals (current goals can now be found in the care plan section) Acute Rehab PT Goals Patient Stated Goal: less pain when moving  PT Goal Formulation: With patient Time For Goal Achievement: 01/22/16 Potential to Achieve Goals: Good Progress towards PT goals: Progressing toward goals    Frequency    Min 3X/week      PT Plan Current plan remains appropriate    Co-evaluation             End of Session Equipment Utilized During Treatment: Gait belt Activity Tolerance: Patient tolerated treatment well Patient left: in chair;with call bell/phone within reach;with chair alarm set     Time: 1102-1140 PT Time Calculation (min) (ACUTE  ONLY): 38 min  Charges:  $Gait Training: 23-37 mins $Therapeutic Activity: 8-22 mins                    G Codes:      Slyvia Lartigue 02/06/16, 12:51 PM

## 2016-01-10 NOTE — Progress Notes (Signed)
PROGRESS NOTE    [name] [name]  ZWC:[id number] DOB: 06-14-1931 DOA: 01/07/2016 PCP: [name], MD   Brief Narrative:  [name] [name] is a 82 y.o. female with medical history significant of an STEMI status post CABG, CAD, paroxysmal atrial fibrillation, anxiety/depression, hypothyroidism, CKD stage IV. Symptoms started about 3 months ago after she had a fall. She was evaluated in the ED at that time and found to have no fractures. Since that time, her pain has remained controlled until the last few days as she has been increasing her activities. Yesterday morning, she will woke up and roll over and heard/felt a pop. That same day she had some pain but was able to ambulate. This morning however, she had severe pain to the point where she cannot ablate out of bed. Pain is located in right hip area and back and is in the same location as when she had a fall 3 months ago. Worked up for her generalized weakness and found to have UTI and Pubic Ramus Fx. PT evaluated and patient to be D/C'd to SNF in Am.   Assessment & Plan:   Principal Problem:   Pubic ramus fracture (HCC) Active Problems:   Anxiety state   Essential hypertension   Anemia in chronic renal disease   A-fib (HCC)   CHF (congestive heart failure) (HCC)   CKD (chronic kidney disease) stage 4, GFR 15-29 ml/min (HCC)  UTI:  -U/A showed + bacteria with large leukocytes and TNTC wbc. She report dark urine and weakness.  -No fever, no leukocytosis, urine culture pending,  -continue IV Rocephin.  Genearlized Weakness with Deconditioning with fall in September 2017  [facility] with a walker,  -now with nondisplaced suprapubic rami fracture, not able to ambulate.  -Pain control -PT recommends SNF Placement  Back pain -CT lumbar spine did show old T11 and t12 fracture, but no acute fracture -Pain control; May Try lidocaine Patch -PT/OT  Paroxysmal Atrial Fibrillation -Per cardiology:paf In the setting post CABG,  -NSR on asa/  lopresser, does not need to be on anticoagulation -continue metoprolol, sinus rhythm.  CKD stage IV -Stable. BUN Cr was 36/1.89 today -Repeat CMP in AM  Hypothyroidism -continue Synthroid -patient report taking biotin, this could potentially affect TSH testing  GERD -d/c Protonix secondary to osteoporosis -changed to Pepcid  Osteoporosis -compression fracture in 09/2015.  -continue vitamin D  Depression/anxiety -continue Lexapro  Anemia of chronic disease -Stable. Follows up with hematology oncology Dr. Burr Medico -continue Aranespas an outpatient  Hyperlipidemia -Continue atorvastatin  Systolic congestive heart failure -Last EF of 45-50% in 2015. Euvolemic. - she look dehydrated on presentation, furosemide held, she received gentle hydration, now off ivf. Dr. Erlinda Hong discussed with patient and family, they agreed to change lasix to prn daily for edema -continue metoprolol as above -daily weights  CAD/CABG -Contine ASA, Metoprolol and Atorvastatin  Carotid Artery Stenosis -continue aspirin  DVT prophylaxis: Lovenox Code Status: FULL CODE Family Communication: No Family present at bedside Disposition Plan: SNF in AM  Consultants:   None  Procedures: None  Antimicrobials: IV Ceftriaxone  Subjective: Seen and examined at bedside and was feeling a little bit better. Complained of Back pain from laying in the bed. No N/V/Abdominal Pain and states she was feeling weaker. No other concerns or complaints at this time.   Objective: Vitals:   01/09/16 2109 01/10/16 0435 01/10/16 1032 01/10/16 1436  BP: (!) 168/68 (!) 149/67 134/69 (!) 112/51  Pulse: 79 78 81 69  Resp: 16 16  15  Temp: 99.3 F (37.4 C) 98.7 F (37.1 C)  98.3 F (36.8 C)  TempSrc: Oral Oral  Oral  SpO2: 96% 97%  98%  Weight:  75.4 kg (166 lb 3.6 oz)    Height:        Intake/Output Summary (Last 24 hours) at 01/10/16 1901 Last data filed at 01/10/16 1857  Gross per 24 hour  Intake               630 ml  Output                0 ml  Net              630 ml   Filed Weights   01/08/16 0639 01/09/16 0500 01/10/16 0435  Weight: 78.6 kg (173 lb 4.5 oz) 74.4 kg (164 lb) 75.4 kg (166 lb 3.6 oz)   Examination: Physical Exam:  Constitutional: WN/WD, in mild Distress from pain Eyes: Lidds and conjunctivae normal, sclerae anicteric  ENMT: External Ears, Nose appear normal. Grossly normal hearing.  Neck: Appears normal, supple, no cervical masses, normal ROM, no appreciable thyromegaly, no JVD Respiratory: Clear to auscultation bilaterally, no wheezing, rales, rhonchi or crackles. Normal respiratory effort and patient is not tachypenic. No accessory muscle use.  Cardiovascular: RRR, no murmurs / rubs / gallops. S1 and S2 auscultated. No extremity edema. 2+ pedal pulses. No carotid bruits.  Abdomen: Soft, non-tender, non-distended. No masses palpated. No appreciable hepatosplenomegaly. Bowel sounds positive x4.  GU: Deferred. Musculoskeletal: No clubbing / cyanosis of digits/nails. No joint deformity upper and lower extremities. Decreased ROM and weakness on muscle testing.  Skin: No rashes, lesions, ulcers. No induration; Warm and dry. Back has erythema where patient had back surgery and is tender to touch.  Neurologic: CN 2-12 grossly intact with no focal deficits. Sensation intact in all 4 Extremities. Romberg sign cerebellar reflexes not assessed.  Psychiatric: Normal judgment and insight. Alert and oriented x 3. Patient a little tangential. Normal mood and appropriate affect.   Data Reviewed: I have personally reviewed following labs and imaging studies  CBC:  Recent Labs Lab 01/07/16 1034 01/08/16 0415 01/10/16 0420  WBC 8.4 7.3 8.8  NEUTROABS 6.4  --  5.9  HGB 9.9* 9.4* 9.1*  HCT 28.9* 27.8* 26.5*  MCV 90.3 91.7 87.7  PLT 332 329 099   Basic Metabolic Panel:  Recent Labs Lab 01/07/16 1034 01/08/16 0415 01/10/16 0420  NA 138 137 134*  K 3.8 4.1 3.9  CL 103 102  100*  CO2 26 26 26   GLUCOSE 103* 95 93  BUN 38* 33* 36*  CREATININE 1.79* 1.98* 1.89*  CALCIUM 8.9 8.8* 8.9   GFR: Estimated Creatinine Clearance: 22.3 mL/min (by C-G formula based on SCr of 1.89 mg/dL (H)). Liver Function Tests: No results for input(s): AST, ALT, ALKPHOS, BILITOT, PROT, ALBUMIN in the last 168 hours. No results for input(s): LIPASE, AMYLASE in the last 168 hours. No results for input(s): AMMONIA in the last 168 hours. Coagulation Profile: No results for input(s): INR, PROTIME in the last 168 hours. Cardiac Enzymes: No results for input(s): CKTOTAL, CKMB, CKMBINDEX, TROPONINI in the last 168 hours. BNP (last 3 results) No results for input(s): PROBNP in the last 8760 hours. HbA1C: No results for input(s): HGBA1C in the last 72 hours. CBG: No results for input(s): GLUCAP in the last 168 hours. Lipid Profile: No results for input(s): CHOL, HDL, LDLCALC, TRIG, CHOLHDL, LDLDIRECT in the last 72 hours. Thyroid Function Tests:  No results for input(s): TSH, T4TOTAL, FREET4, T3FREE, THYROIDAB in the last 72 hours. Anemia Panel: No results for input(s): VITAMINB12, FOLATE, FERRITIN, TIBC, IRON, RETICCTPCT in the last 72 hours. Sepsis Labs: No results for input(s): PROCALCITON, LATICACIDVEN in the last 168 hours.  No results found for this or any previous visit (from the past 240 hour(s)).   Radiology Studies: No results found.  Scheduled Meds: . aspirin EC  81 mg Oral Daily  . atorvastatin  40 mg Oral QODAY  . B-complex with vitamin C  1 tablet Oral Daily  . cefTRIAXone (ROCEPHIN)  IV  1 g Intravenous Q24H  . cholecalciferol  1,000 Units Oral Daily  . enoxaparin (LOVENOX) injection  30 mg Subcutaneous Q24H  . escitalopram  5 mg Oral Daily  . famotidine  10 mg Oral QHS  . levothyroxine  50 mcg Oral QAC breakfast  . metoprolol tartrate  25 mg Oral BID  . polyethylene glycol  17 g Oral Daily  . senna-docusate  1 tablet Oral Daily   Continuous Infusions:    LOS: 2 days   Kerney Elbe, DO [facility] Pager [phone]  If 7PM-7AM, please contact night-coverage [email or web address] Password [email or web address] 01/10/2016, 7:01 PM

## 2016-01-11 DIAGNOSIS — R278 Other lack of coordination: Secondary | ICD-10-CM | POA: Diagnosis not present

## 2016-01-11 DIAGNOSIS — I480 Paroxysmal atrial fibrillation: Secondary | ICD-10-CM | POA: Diagnosis not present

## 2016-01-11 DIAGNOSIS — S32591A Other specified fracture of right pubis, initial encounter for closed fracture: Secondary | ICD-10-CM | POA: Diagnosis not present

## 2016-01-11 DIAGNOSIS — M7050 Other bursitis of knee, unspecified knee: Secondary | ICD-10-CM | POA: Diagnosis not present

## 2016-01-11 DIAGNOSIS — Z789 Other specified health status: Secondary | ICD-10-CM | POA: Diagnosis not present

## 2016-01-11 DIAGNOSIS — F411 Generalized anxiety disorder: Secondary | ICD-10-CM | POA: Diagnosis not present

## 2016-01-11 DIAGNOSIS — M1710 Unilateral primary osteoarthritis, unspecified knee: Secondary | ICD-10-CM | POA: Diagnosis not present

## 2016-01-11 DIAGNOSIS — K219 Gastro-esophageal reflux disease without esophagitis: Secondary | ICD-10-CM | POA: Diagnosis not present

## 2016-01-11 DIAGNOSIS — K5900 Constipation, unspecified: Secondary | ICD-10-CM | POA: Diagnosis not present

## 2016-01-11 DIAGNOSIS — M6281 Muscle weakness (generalized): Secondary | ICD-10-CM | POA: Diagnosis not present

## 2016-01-11 DIAGNOSIS — M5442 Lumbago with sciatica, left side: Secondary | ICD-10-CM | POA: Diagnosis not present

## 2016-01-11 DIAGNOSIS — S329XXS Fracture of unspecified parts of lumbosacral spine and pelvis, sequela: Secondary | ICD-10-CM | POA: Diagnosis not present

## 2016-01-11 DIAGNOSIS — S32599A Other specified fracture of unspecified pubis, initial encounter for closed fracture: Secondary | ICD-10-CM | POA: Diagnosis not present

## 2016-01-11 DIAGNOSIS — I2510 Atherosclerotic heart disease of native coronary artery without angina pectoris: Secondary | ICD-10-CM | POA: Diagnosis not present

## 2016-01-11 DIAGNOSIS — Z9181 History of falling: Secondary | ICD-10-CM | POA: Diagnosis not present

## 2016-01-11 DIAGNOSIS — I5022 Chronic systolic (congestive) heart failure: Secondary | ICD-10-CM | POA: Diagnosis not present

## 2016-01-11 DIAGNOSIS — M109 Gout, unspecified: Secondary | ICD-10-CM | POA: Diagnosis not present

## 2016-01-11 DIAGNOSIS — R531 Weakness: Secondary | ICD-10-CM | POA: Diagnosis not present

## 2016-01-11 DIAGNOSIS — I5020 Unspecified systolic (congestive) heart failure: Secondary | ICD-10-CM | POA: Diagnosis not present

## 2016-01-11 DIAGNOSIS — I214 Non-ST elevation (NSTEMI) myocardial infarction: Secondary | ICD-10-CM | POA: Diagnosis not present

## 2016-01-11 DIAGNOSIS — L659 Nonscarring hair loss, unspecified: Secondary | ICD-10-CM | POA: Diagnosis not present

## 2016-01-11 DIAGNOSIS — S22080G Wedge compression fracture of T11-T12 vertebra, subsequent encounter for fracture with delayed healing: Secondary | ICD-10-CM | POA: Diagnosis not present

## 2016-01-11 DIAGNOSIS — D649 Anemia, unspecified: Secondary | ICD-10-CM | POA: Diagnosis not present

## 2016-01-11 DIAGNOSIS — D631 Anemia in chronic kidney disease: Secondary | ICD-10-CM | POA: Diagnosis not present

## 2016-01-11 DIAGNOSIS — M545 Low back pain: Secondary | ICD-10-CM | POA: Diagnosis not present

## 2016-01-11 DIAGNOSIS — E039 Hypothyroidism, unspecified: Secondary | ICD-10-CM | POA: Diagnosis not present

## 2016-01-11 DIAGNOSIS — R2681 Unsteadiness on feet: Secondary | ICD-10-CM | POA: Diagnosis not present

## 2016-01-11 DIAGNOSIS — M5440 Lumbago with sciatica, unspecified side: Secondary | ICD-10-CM | POA: Diagnosis not present

## 2016-01-11 DIAGNOSIS — M542 Cervicalgia: Secondary | ICD-10-CM | POA: Diagnosis not present

## 2016-01-11 DIAGNOSIS — I10 Essential (primary) hypertension: Secondary | ICD-10-CM | POA: Diagnosis not present

## 2016-01-11 DIAGNOSIS — G8929 Other chronic pain: Secondary | ICD-10-CM | POA: Diagnosis not present

## 2016-01-11 DIAGNOSIS — G5702 Lesion of sciatic nerve, left lower limb: Secondary | ICD-10-CM | POA: Diagnosis not present

## 2016-01-11 DIAGNOSIS — R6889 Other general symptoms and signs: Secondary | ICD-10-CM | POA: Diagnosis not present

## 2016-01-11 DIAGNOSIS — M810 Age-related osteoporosis without current pathological fracture: Secondary | ICD-10-CM | POA: Diagnosis not present

## 2016-01-11 DIAGNOSIS — S329XXA Fracture of unspecified parts of lumbosacral spine and pelvis, initial encounter for closed fracture: Secondary | ICD-10-CM | POA: Diagnosis not present

## 2016-01-11 DIAGNOSIS — N184 Chronic kidney disease, stage 4 (severe): Secondary | ICD-10-CM | POA: Diagnosis not present

## 2016-01-11 DIAGNOSIS — R29898 Other symptoms and signs involving the musculoskeletal system: Secondary | ICD-10-CM | POA: Diagnosis not present

## 2016-01-11 DIAGNOSIS — F4321 Adjustment disorder with depressed mood: Secondary | ICD-10-CM | POA: Diagnosis not present

## 2016-01-11 DIAGNOSIS — M549 Dorsalgia, unspecified: Secondary | ICD-10-CM | POA: Diagnosis not present

## 2016-01-11 DIAGNOSIS — G4700 Insomnia, unspecified: Secondary | ICD-10-CM | POA: Diagnosis not present

## 2016-01-11 LAB — CBC WITH DIFFERENTIAL/PLATELET
BASOS PCT: 1 %
Basophils Absolute: 0 10*3/uL (ref 0.0–0.1)
EOS ABS: 0.3 10*3/uL (ref 0.0–0.7)
EOS PCT: 5 %
HCT: 26.2 % — ABNORMAL LOW (ref 36.0–46.0)
HEMOGLOBIN: 9.1 g/dL — AB (ref 12.0–15.0)
LYMPHS ABS: 1.8 10*3/uL (ref 0.7–4.0)
Lymphocytes Relative: 29 %
MCH: 30.5 pg (ref 26.0–34.0)
MCHC: 34.7 g/dL (ref 30.0–36.0)
MCV: 87.9 fL (ref 78.0–100.0)
MONO ABS: 0.6 10*3/uL (ref 0.1–1.0)
MONOS PCT: 9 %
NEUTROS PCT: 56 %
Neutro Abs: 3.5 10*3/uL (ref 1.7–7.7)
PLATELETS: 349 10*3/uL (ref 150–400)
RBC: 2.98 MIL/uL — ABNORMAL LOW (ref 3.87–5.11)
RDW: 12.7 % (ref 11.5–15.5)
WBC: 6.3 10*3/uL (ref 4.0–10.5)

## 2016-01-11 LAB — COMPREHENSIVE METABOLIC PANEL
ALBUMIN: 3.1 g/dL — AB (ref 3.5–5.0)
ALT: 12 U/L — ABNORMAL LOW (ref 14–54)
ANION GAP: 7 (ref 5–15)
AST: 22 U/L (ref 15–41)
Alkaline Phosphatase: 67 U/L (ref 38–126)
BUN: 32 mg/dL — ABNORMAL HIGH (ref 6–20)
CALCIUM: 9 mg/dL (ref 8.9–10.3)
CO2: 27 mmol/L (ref 22–32)
Chloride: 102 mmol/L (ref 101–111)
Creatinine, Ser: 1.75 mg/dL — ABNORMAL HIGH (ref 0.44–1.00)
GFR calc non Af Amer: 26 mL/min — ABNORMAL LOW (ref >60)
GFR, EST AFRICAN AMERICAN: 30 mL/min — AB (ref >60)
GLUCOSE: 89 mg/dL (ref 65–99)
POTASSIUM: 4.1 mmol/L (ref 3.5–5.1)
SODIUM: 136 mmol/L (ref 135–145)
Total Bilirubin: 0.8 mg/dL (ref 0.3–1.2)
Total Protein: 6.5 g/dL (ref 6.5–8.1)

## 2016-01-11 LAB — PHOSPHORUS: PHOSPHORUS: 3.9 mg/dL (ref 2.5–4.6)

## 2016-01-11 LAB — URINE CULTURE

## 2016-01-11 LAB — MAGNESIUM: Magnesium: 2.2 mg/dL (ref 1.7–2.4)

## 2016-01-11 MED ORDER — HYDROCODONE-ACETAMINOPHEN 5-325 MG PO TABS: 1.0000 | ORAL_TABLET | ORAL | 0 refills | Status: DC | PRN

## 2016-01-11 MED ORDER — B COMPLEX-C PO TABS: 1.0000 | ORAL_TABLET | Freq: Every day | ORAL | 0 refills | Status: DC

## 2016-01-11 MED ORDER — POLYETHYLENE GLYCOL 3350 17 G PO PACK: 17.0000 g | PACK | Freq: Every day | ORAL | 0 refills | Status: DC | PRN

## 2016-01-11 MED ORDER — ACETAMINOPHEN 325 MG PO TABS: 650.0000 mg | ORAL_TABLET | Freq: Four times a day (QID) | ORAL | 0 refills | Status: AC | PRN

## 2016-01-11 NOTE — Progress Notes (Signed)
Physical Therapy Treatment Patient Details Name: [name] [name] MRN: [id number] DOB: 12-16-1931 Today's Date: 01/11/2016    History of Present Illness GERA INBODEN is a 82 y.o. female with pubic ramus fx; medical history significant of an STEMI status post CABG, CAD, paroxysmal atrial fibrillation, anxiety/depression, hypothyroidism, CKD stage IV.    PT Comments    Assisted OOB to amb to bathroom then in hallway  Follow Up Recommendations  SNF     Equipment Recommendations  None recommended by PT    Recommendations for Other Services       Precautions / Restrictions Precautions Precautions: Fall Restrictions Weight Bearing Restrictions: No RLE Weight Bearing: Weight bearing as tolerated    Mobility  Bed Mobility Overal bed mobility: Needs Assistance Bed Mobility: Rolling;Sidelying to Sit Rolling: Min guard Sidelying to sit: Min guard       General bed mobility comments: pt utilizing bed rail and increased time  Transfers Overall transfer level: Needs assistance Equipment used: Rolling walker (2 wheeled) Transfers: Sit to/from Stand Sit to Stand: Min guard;Min assist Stand pivot transfers: Min guard;Min assist       General transfer comment: cues for hand placement, overall safety  Ambulation/Gait Ambulation/Gait assistance: Min guard;Min assist Ambulation Distance (Feet): 82 Feet Assistive device: Rolling walker (2 wheeled) Gait Pattern/deviations: Step-through pattern;Decreased step length - right;Decreased step length - left;Shuffle;Trunk flexed Gait velocity: decr   General Gait Details: Increased time, cues for posture, position from RW, multiple short standing rests   Stairs            Wheelchair Mobility    Modified Rankin (Stroke Patients Only)       Balance                                    Cognition Arousal/Alertness: Awake/alert Behavior During Therapy: WFL for tasks assessed/performed Overall Cognitive  Status: Within Functional Limits for tasks assessed                      Exercises      General Comments        Pertinent Vitals/Pain Pain Assessment: Faces Faces Pain Scale: Hurts little more Pain Location: "butt bone" Pain Descriptors / Indicators: Discomfort;Grimacing Pain Intervention(s): Monitored during session;Repositioned    Home Living                      Prior Function            PT Goals (current goals can now be found in the care plan section) Progress towards PT goals: Progressing toward goals    Frequency    Min 3X/week      PT Plan Current plan remains appropriate    Co-evaluation             End of Session Equipment Utilized During Treatment: Gait belt Activity Tolerance: Patient tolerated treatment well Patient left: in chair;with call bell/phone within reach;with chair alarm set     Time: [phone] PT Time Calculation (min) (ACUTE ONLY): 24 min  Charges:  $Gait Training: 8-22 mins $Therapeutic Activity: 8-22 mins                    G Codes:      Rica Koyanagi  PTA [facility]  Acute  Rehab Pager      [phone]

## 2016-01-11 NOTE — Progress Notes (Signed)
Attempted to call report to [facility] at [phone]. No answer.

## 2016-01-11 NOTE — Discharge Summary (Signed)
Physician Discharge Summary  [name] [name] GYJ:[id number] DOB: 28-Apr-1931 DOA: 01/07/2016  PCP: [name], MD  Admit date: 01/07/2016 Discharge date: 01/11/2016  Admitted From: Home Disposition:  SNF  Recommendations for Outpatient Follow-up:  1. Follow up with PCP in 1-2 weeks 2. Please obtain BMP/CBC within 1 week 3. Repeat TSH and Free T4 in 6 weeks 4. Follow up with Dr. Burr Medico in Hematology in 1 week.   Home Health: No Equipment/Devices: None  Discharge Condition: Stable CODE STATUS: Full Diet recommendation: Heart Healthy   Brief/Interim Summary: Myangel Summons Priceis a 82 y.o.femalewith medical history significant of an STEMI status post CABG, CAD, paroxysmal atrial fibrillation, anxiety/depression, hypothyroidism, CKD stage IV. Symptoms started about 3 months ago after she had a fall. She was evaluated in the ED at that time and found to have no fractures. Since that time, her pain has remained controlled until the last few days priror to admission as she has been increasing her activities. December 16th, 2017 she will woke up and roll over and heard/felt a pop. That same day she had some pain but was able to ambulate. On Decemeber 17th, she had severe pain to the point where she cannot ablate out of bed so EMS was called. Pain is located in right hip area and back and is in the same location as when she had a fall 3 months ago. Worked up for her generalized weakness and found to have UTI and Pubic Ramus Fx. PT evaluated and patient to be D/C'd today. At this time patient is medically stable to be D/C'd to SNF.   Discharge Diagnoses:  Principal Problem:   Pubic ramus fracture (HCC) Active Problems:   Anxiety state   Essential hypertension   Anemia in chronic renal disease   A-fib (HCC)   CHF (congestive heart failure) (HCC)   CKD (chronic kidney disease) stage 4, GFR 15-29 ml/min (HCC)  UTI: -U/A showed + bacteria with large leukocytes and TNTC wbc. She report dark  urine and weakness.  -No fever, no leukocytosis, urine culture pending,  -Given IV Rocephin x 5 days  Genearlized Weakness with Deconditioning with fall in September 2017  -Walks with a walker  -nowwith nondisplaced suprapubic rami fracture, not able to ambulate.  -Continue with Pain control with Hydrocodone and Acetaminophen -PT recommends SNF Placement  Back pain -CT lumbar spine did show old T11 and t12 fracture, but no acute fracture -Pain control; May Try lidocaine Patch if necessary -PT/OT recommends SNF for continued Mobility and Ambulation  Paroxysmal Atrial Fibrillation -Per cardiology:pafIn the setting post CABG, -NSR on ASA and lopresser, does not need to be on anticoagulation -continue metoprolol, sinus rhythm.  CKD stage IV -Stable. BUN Cr was 32/1.75 today -Repeat CMP as an outpatient  Hypothyroidism -continue Synthroid -patient report taking biotin, this could potentially affect TSH testing -Repeat TSH Testing as an outpatient now   GERD -d/c Protonix secondary to osteoporosis -changed to Pepcid  Osteoporosis -compression fracture in 09/2015.  -continue vitamin D  Depression/anxiety -continue Lexapro  Anemia of chronic disease -Stable. Hb/Hct was 9.1/26.2 this Am. -Follows up with hematology oncology Dr. Burr Medico -contin ue Aranespas an outpatient  Hyperlipidemia -Continue atorvastatin  Systolic congestive heart failure, currently not in exacerbation -Last EF of 45-50% in 2015. Euvolemic. - she looked dehydrated on presentation, furosemide held, she received gentle hydration, now off ivf. Dr. Erlinda Hong discussed with patient and family, they agreed to change lasix to prn daily for edema -continue metoprolol as above -daily weights  CAD/CABG -Contine ASA, Metoprolol and Atorvastatin  Carotid Artery Stenosis -continue Aspirin  Discharge Instructions  Discharge Instructions    Call MD for:  difficulty breathing, headache or visual  disturbances    Complete by:  As directed    Call MD for:  hives    Complete by:  As directed    Call MD for:  persistant dizziness or light-headedness    Complete by:  As directed    Call MD for:  persistant nausea and vomiting    Complete by:  As directed    Call MD for:  severe uncontrolled pain    Complete by:  As directed    Call MD for:  temperature >100.4    Complete by:  As directed    Diet - low sodium heart healthy    Complete by:  As directed    Discharge instructions    Complete by:  As directed    Follow Up Care at SNF   Increase activity slowly    Complete by:  As directed      Allergies as of 01/11/2016      Reactions   Codeine Sulfate Nausea And Vomiting   Darvon [propoxyphene] Nausea And Vomiting   hallucinations   Propoxyphene Napsylate Nausea And Vomiting   Sulfacetamide Sodium-sulfur Nausea And Vomiting   Sulfamethoxazole-trimethoprim Nausea Only   Can't remember reaction   Zolpidem    Other reaction(s): Mental Status Changes (intolerance)      Medication List    STOP taking these medications   Biotin 5000 MCG Caps   pantoprazole 40 MG tablet Commonly known as:  PROTONIX     TAKE these medications   acetaminophen 325 MG tablet Commonly known as:  TYLENOL Take 2 tablets (650 mg total) by mouth every 6 (six) hours as needed for mild pain (or Fever >/= 101).   albuterol 108 (90 Base) MCG/ACT inhaler Commonly known as:  PROVENTIL HFA;VENTOLIN HFA Inhale 1-2 puffs into the lungs every 6 (six) hours as needed for wheezing or shortness of breath.   aspirin EC 81 MG tablet Take 1 tablet (81 mg total) by mouth daily.   atorvastatin 40 MG tablet Commonly known as:  LIPITOR Take 40 mg by mouth every other day.   b complex vitamins capsule Take 1 capsule by mouth daily.   Cholecalciferol 1000 units tablet Commonly known as:  EQL VITAMIN D3 Take 1 tablet (1,000 Units total) by mouth daily.   darbepoetin 100 MCG/0.5ML Soln injection Commonly  known as:  ARANESP Inject 100 mcg into the skin every 8 (eight) weeks.   escitalopram 5 MG tablet Commonly known as:  LEXAPRO Take 1 tablet (5 mg total) by mouth daily.   famotidine 10 MG tablet Commonly known as:  PEPCID Take 1 tablet (10 mg total) by mouth at bedtime.   furosemide 20 MG tablet Commonly known as:  LASIX Take 1 tablet (20 mg total) by mouth daily as needed for fluid or edema. What changed:  See the new instructions.   HYDROcodone-acetaminophen 5-325 MG tablet Commonly known as:  NORCO/VICODIN Take 1-2 tablets by mouth every 4 (four) hours as needed for moderate pain.   levothyroxine 50 MCG tablet Commonly known as:  SYNTHROID, LEVOTHROID TAKE 1 TABLET BY MOUTH EVERY DAY BEFORE BREAKFAST   metoprolol tartrate 25 MG tablet Commonly known as:  LOPRESSOR Take 1 tablet (25 mg total) by mouth 2 (two) times daily.   polyethylene glycol packet Commonly known as:  MIRALAX / GLYCOLAX Take  17 g by mouth daily as needed for mild constipation.   sennosides-docusate sodium 8.6-50 MG tablet Commonly known as:  SENOKOT-S Take 1 tablet by mouth daily.       Allergies  Allergen Reactions  . Codeine Sulfate Nausea And Vomiting  . Darvon [Propoxyphene] Nausea And Vomiting    hallucinations  . Propoxyphene Napsylate Nausea And Vomiting  . Sulfacetamide Sodium-Sulfur Nausea And Vomiting  . Sulfamethoxazole-Trimethoprim Nausea Only    Can't remember reaction  . Zolpidem     Other reaction(s): Mental Status Changes (intolerance)   Consultations:  None  Procedures/Studies: Ct Lumbar Spine Wo Contrast  Result Date: 01/07/2016 CLINICAL DATA:  Worsening pain in the right hip. Injured turning over and head. EXAM: CT LUMBAR SPINE WITHOUT CONTRAST TECHNIQUE: Multidetector CT imaging of the lumbar spine was performed without intravenous contrast administration. Multiplanar CT image reconstructions were also generated. COMPARISON:  10/21/2015 FINDINGS: Segmentation: 5  lumbar type vertebral bodies. Alignment: No significant malalignment. Vertebrae: There is a healing partial compression fracture of T11 which was acute on the study of 10/21/2015. Loss of height is 10% or less. Solid bone fusion noted from T10 through T12. Old augmented fracture at T12 which has not changed. Old inferior endplate fracture/ Schmorl's node at L1 which has not changed. Old inferior endplate fracture at L2 has not changed. Old biconcave fracture at L3 has not changed. Minimal superior endplate depression at L4 has not changed. Minimal endplate deformities at L5 narrow but not changed. No evidence of sacral fracture. Paraspinal and other soft tissues: Aortic atherosclerosis. Otherwise negative. Disc levels: L1-2: Retrolisthesis of 3 mm. Mild stenosis of the lateral recesses and foramina. L2-3: Disc bulge and posterior bowing of the posterosuperior endplate of L3. Mild stenosis of both lateral recesses. L3-4: Disc bulge. Facet hypertrophy. Mild stenosis of both lateral recesses. L4-5: Disc bulge. Mild facet hypertrophy. Mild stenosis of both lateral recesses. L5-S1: Endplate osteophytes and mild bulging of the disc. No central canal stenosis. Mild foraminal stenosis because of osteophytic encroachment right more than left. IMPRESSION: No acute fractures seen today. There is a subacute, healing fracture at T11 with loss of height of only 10% or less. This was acute in September. Old augmented fracture at T12. Old un augmented fractures at the other lumbar levels as outlined above. Electronically Signed   By: Nelson Chimes M.D.   On: 01/07/2016 11:28   Ct Hip Right Wo Contrast  Result Date: 01/07/2016 CLINICAL DATA:  Right hip pain increasing last day. EXAM: CT OF THE RIGHT HIP WITHOUT CONTRAST TECHNIQUE: Multidetector CT imaging of the right hip was performed according to the standard protocol. Multiplanar CT image reconstructions were also generated. COMPARISON:  None. FINDINGS: Bones/Joint/Cartilage  Mild joint space narrowing and early spurring within the right hip joint. No evidence of femoral neck fracture. Mild cortical irregularity seen on the coronal reconstructed images in the right superior pubic ramus on image 20 which could reflect nondisplaced superior pubic ramus fracture. This cannot be confirmed on other series. Ligaments Suboptimally assessed by CT. Muscles and Tendons Grossly unremarkable. Soft tissues No acute soft tissue abnormality. Calcifications in the iliac and femoral vessels. IMPRESSION: No evidence of femoral neck fracture. Questionable nondisplaced right superior pubic ramus fracture, only seen on coronal imaging. Early degenerative changes in the right hip. Electronically Signed   By: Rolm Baptise M.D.   On: 01/07/2016 11:38     Subjective: Seen and examined at bedside this am as she was up in a chair and she  was feeling much better. Stated she had a rough day yesterday but today was better. Had some Nausea after drinking Miralax but it wasn't too bad. Ambulated with PT to the restroom. No CP/SOB or other complaints.    Discharge Exam: Vitals:   01/10/16 2138 01/11/16 0529  BP: 131/63 (!) 118/51  Pulse: 69 68  Resp: 16 16  Temp: 98.1 F (36.7 C) 98.2 F (36.8 C)   Vitals:   01/10/16 1032 01/10/16 1436 01/10/16 2138 01/11/16 0529  BP: 134/69 (!) 112/51 131/63 (!) 118/51  Pulse: 81 69 69 68  Resp:  15 16 16   Temp:  98.3 F (36.8 C) 98.1 F (36.7 C) 98.2 F (36.8 C)  TempSrc:  Oral Oral Oral  SpO2:  98% 96% 96%  Weight:    84.1 kg (185 lb 6.5 oz)  Height:       General: Pt is alert, awake, not in acute distress Cardiovascular: RRR, S1/S2 +, no rubs, no gallops Respiratory: CTA bilaterally, no wheezing, no rhonchi Abdominal: Soft, NT, ND, bowel sounds + Extremities: no edema, no cyanosis  The results of significant diagnostics from this hospitalization (including imaging, microbiology, ancillary and laboratory) are listed below for reference.     Microbiology: Recent Results (from the past 240 hour(s))  Culture, Urine     Status: Abnormal   Collection Time: 01/09/16  4:31 PM  Result Value Ref Range Status   Specimen Description URINE, CLEAN CATCH  Final   Special Requests NONE  Final   Culture MULTIPLE SPECIES PRESENT, SUGGEST RECOLLECTION (A)  Final   Report Status 01/11/2016 FINAL  Final    Labs: BNP (last 3 results) No results for input(s): BNP in the last 8760 hours. Basic Metabolic Panel:  Recent Labs Lab 01/07/16 1034 01/08/16 0415 01/10/16 0420 01/11/16 0426  NA 138 137 134* 136  K 3.8 4.1 3.9 4.1  CL 103 102 100* 102  CO2 26 26 26 27   GLUCOSE 103* 95 93 89  BUN 38* 33* 36* 32*  CREATININE 1.79* 1.98* 1.89* 1.75*  CALCIUM 8.9 8.8* 8.9 9.0  MG  --   --   --  2.2  PHOS  --   --   --  3.9   Liver Function Tests:  Recent Labs Lab 01/11/16 0426  AST 22  ALT 12*  ALKPHOS 67  BILITOT 0.8  PROT 6.5  ALBUMIN 3.1*   No results for input(s): LIPASE, AMYLASE in the last 168 hours. No results for input(s): AMMONIA in the last 168 hours. CBC:  Recent Labs Lab 01/07/16 1034 01/08/16 0415 01/10/16 0420 01/11/16 0426  WBC 8.4 7.3 8.8 6.3  NEUTROABS 6.4  --  5.9 3.5  HGB 9.9* 9.4* 9.1* 9.1*  HCT 28.9* 27.8* 26.5* 26.2*  MCV 90.3 91.7 87.7 87.9  PLT 332 329 343 349   Cardiac Enzymes: No results for input(s): CKTOTAL, CKMB, CKMBINDEX, TROPONINI in the last 168 hours. BNP: Invalid input(s): POCBNP CBG: No results for input(s): GLUCAP in the last 168 hours. D-Dimer No results for input(s): DDIMER in the last 72 hours. Hgb A1c No results for input(s): HGBA1C in the last 72 hours. Lipid Profile No results for input(s): CHOL, HDL, LDLCALC, TRIG, CHOLHDL, LDLDIRECT in the last 72 hours. Thyroid function studies No results for input(s): TSH, T4TOTAL, T3FREE, THYROIDAB in the last 72 hours.  Invalid input(s): FREET3 Anemia work up No results for input(s): VITAMINB12, FOLATE, FERRITIN, TIBC, IRON,  RETICCTPCT in the last 72 hours. Urinalysis  Component Value Date/Time   COLORURINE YELLOW 01/07/2016 1029   APPEARANCEUR HAZY (A) 01/07/2016 1029   LABSPEC 1.012 01/07/2016 1029   PHURINE 6.0 01/07/2016 1029   GLUCOSEU NEGATIVE 01/07/2016 1029   GLUCOSEU NEGATIVE 10/01/2012 0926   HGBUR NEGATIVE 01/07/2016 1029   HGBUR trace-intact 02/04/2008 0000   BILIRUBINUR NEGATIVE 01/07/2016 1029   KETONESUR NEGATIVE 01/07/2016 1029   PROTEINUR NEGATIVE 01/07/2016 1029   UROBILINOGEN 0.2 05/02/2014 0842   NITRITE NEGATIVE 01/07/2016 1029   LEUKOCYTESUR LARGE (A) 01/07/2016 1029   Sepsis Labs Invalid input(s): PROCALCITONIN,  WBC,  LACTICIDVEN Microbiology Recent Results (from the past 240 hour(s))  Culture, Urine     Status: Abnormal   Collection Time: 01/09/16  4:31 PM  Result Value Ref Range Status   Specimen Description URINE, CLEAN CATCH  Final   Special Requests NONE  Final   Culture MULTIPLE SPECIES PRESENT, SUGGEST RECOLLECTION (A)  Final   Report Status 01/11/2016 FINAL  Final   Time coordinating discharge: Over 30 minutes  SIGNED:  Kerney Elbe, DO [facility] 01/11/2016, 10:43 AM Pager [phone]  If 7PM-7AM, please contact night-coverage [email or web address] Password [email or web address]

## 2016-01-11 NOTE — Clinical Social Work Placement (Signed)
Patient is set to discharge to [facility] SN[facility] today. Patient & daughter, [name] [name] made aware. Discharge packet given to RN, [facility]. [facility] called for transport.     Raynaldo Opitz, [facility] Clinical Social Worker cell #: [phone]    CLINICAL SOCIAL WORK PLACEMENT  NOTE  Date:  01/11/2016  Patient Details  Name: [name] [name] MRN: [id number] Date of Birth: [birth year]  Clinical Social Work is seeking post-discharge placement for this patient at the [place] level of care (*CSW will initial, date and re-position this form in  chart as items are completed):  Yes   Patient/family provided with [place] Work Department's list of facilities offering this level of care within the geographic area requested by the patient (or if unable, by the patient's family).  Yes   Patient/family informed of their freedom to choose among providers that offer the needed level of care, that participate in Medicare, Medicaid or managed care program needed by the patient, have an available bed and are willing to accept the patient.  Yes   Patient/family informed of [facility]'s ownership interest in [facility] and [facility], as well as of the fact that they are under no obligation to receive care at these facilities.  PASRR submitted to E[employer] on 01/08/16     PASRR number received on 01/08/16     Existing PASRR number confirmed on       FL2 transmitted to all facilities in geographic area requested by pt/family on 01/08/16     FL2 transmitted to all facilities within larger geographic area on       Patient informed that his/her managed care company has contracts with or will negotiate with certain facilities, including the following:        Yes   Patient/family informed of bed offers received.  Patient chooses bed at [facility]     Physician recommends and patient chooses bed at      Patient to be transferred to  [facility] on 01/11/16.  Patient to be transferred to facility by [facility]     Patient family notified on 01/11/16 of transfer.  Name of family member notified:  patient's daughter, [name] [name] via phone     PHYSICIAN       Additional Comment:    _______________________________________________ Standley Brooking, LCSW 01/11/2016, 1:48 PM

## 2016-01-12 ENCOUNTER — Encounter: Payer: Self-pay | Admitting: Nurse Practitioner

## 2016-01-12 ENCOUNTER — Non-Acute Institutional Stay (SKILLED_NURSING_FACILITY): Payer: Medicare Other | Admitting: Nurse Practitioner

## 2016-01-12 DIAGNOSIS — I10 Essential (primary) hypertension: Secondary | ICD-10-CM | POA: Diagnosis not present

## 2016-01-12 DIAGNOSIS — I480 Paroxysmal atrial fibrillation: Secondary | ICD-10-CM | POA: Diagnosis not present

## 2016-01-12 DIAGNOSIS — E039 Hypothyroidism, unspecified: Secondary | ICD-10-CM

## 2016-01-12 DIAGNOSIS — I5020 Unspecified systolic (congestive) heart failure: Secondary | ICD-10-CM | POA: Diagnosis not present

## 2016-01-12 DIAGNOSIS — M109 Gout, unspecified: Secondary | ICD-10-CM | POA: Diagnosis not present

## 2016-01-12 DIAGNOSIS — I2510 Atherosclerotic heart disease of native coronary artery without angina pectoris: Secondary | ICD-10-CM

## 2016-01-12 DIAGNOSIS — F411 Generalized anxiety disorder: Secondary | ICD-10-CM | POA: Diagnosis not present

## 2016-01-12 DIAGNOSIS — S32591A Other specified fracture of right pubis, initial encounter for closed fracture: Secondary | ICD-10-CM | POA: Diagnosis not present

## 2016-01-12 DIAGNOSIS — G4700 Insomnia, unspecified: Secondary | ICD-10-CM

## 2016-01-12 DIAGNOSIS — K5900 Constipation, unspecified: Secondary | ICD-10-CM

## 2016-01-12 DIAGNOSIS — K219 Gastro-esophageal reflux disease without esophagitis: Secondary | ICD-10-CM

## 2016-01-12 DIAGNOSIS — N184 Chronic kidney disease, stage 4 (severe): Secondary | ICD-10-CM

## 2016-01-12 DIAGNOSIS — D631 Anemia in chronic kidney disease: Secondary | ICD-10-CM

## 2016-01-12 NOTE — Assessment & Plan Note (Signed)
Aranesp q2months, Hgb 9.1 01/11/16

## 2016-01-12 NOTE — Assessment & Plan Note (Signed)
Stable

## 2016-01-12 NOTE — Assessment & Plan Note (Signed)
Stable, continue Famotidine 10mg  qhs.

## 2016-01-12 NOTE — Assessment & Plan Note (Addendum)
Continue Levothyroxine 49mcg, update TSH 6

## 2016-01-12 NOTE — Assessment & Plan Note (Signed)
01/10/16 credat 1.8

## 2016-01-12 NOTE — Assessment & Plan Note (Signed)
Stable, continue Senokot I daily, prn MiraLax.

## 2016-01-12 NOTE — Assessment & Plan Note (Signed)
Hx of CABG x4 about 2-3 years ago, taking Lipitor and ASA

## 2016-01-12 NOTE — Assessment & Plan Note (Signed)
Managed with Lexapro 5mg 

## 2016-01-12 NOTE — Progress Notes (Signed)
Location:  [facility] Room Number: 81 Place of Service:  SNF (31) Provider:  Kyli Sorter, Manxie  NP  Jeanmarie Hubert, MD  Patient Care Team: Estill Dooms, MD as PCP - General (Internal Medicine) Josue Hector, MD as Consulting Physician (Cardiology)  Extended Emergency Contact Information Primary Emergency Contact: Sandria Manly, [place] Phone: [phone] Relation: Daughter Secondary Emergency Contact: Wittmer,Patricia  [place] Mobile Phone: [phone] Relation: Daughter  Code Status:  Full Code Goals of care: Advanced Directive information Advanced Directives 01/12/2016  Does Patient Have a Medical Advance Directive? Yes  Type of Advance Directive -  Does patient want to make changes to medical advance directive? No - Patient declined  Copy of [place] in Chart? Yes  Would patient like information on creating a medical advance directive? No - Patient declined  Pre-existing out of facility DNR order (yellow form or pink MOST form) -     Chief Complaint  Patient presents with  . Medical Management of Chronic Issues    HPI:  Pt is a 82 y.o. female seen today for medical management of chronic diseases.    Hospitalized 01/07/16 to 01/11/16 for nondisplaced suprapubic rami fx sustained from fall 01/06/16 and UTI. UTI completed 5 day course of Rocephin. CT showed T12 and T12 fx w/o pain.   Hx of an STEMI status post CABG, CAD, paroxysmal atrial fibrillation, anxiety/depression, hypothyroidism, CKD stage IV. Past Medical History:  Diagnosis Date  . Anemia, unspecified   . Anxiety state, unspecified   . Depressive disorder, not elsewhere classified   . Diverticulosis of colon (without mention of hemorrhage)   . Diverticulosis of colon with hemorrhage   . Esophageal reflux   . Lumbago   . Myocardial infarction   . Other and unspecified hyperlipidemia   . Persistent  disorder of initiating or maintaining sleep   . Thyroid disease   . Unspecified asthma(493.90)   . Unspecified disorder resulting from impaired renal function   . Unspecified essential hypertension    Dr [name]  . Unspecified osteomyelitis, site unspecified   . Unspecified vitamin D deficiency    Past Surgical History:  Procedure Laterality Date  . BONE MARROW BIOPSY    . CORONARY ARTERY BYPASS GRAFT  03/25/13   x4. CABS ON PUMP-Surgeon Jomarie Longs.  [name] PARTIAL COLECTOMY  1995   diverticulosis of colon iwth hemorrhage.   . status post right carotid enterectomy      Allergies  Allergen Reactions  . Codeine Sulfate Nausea And Vomiting  . Darvon [Propoxyphene] Nausea And Vomiting    hallucinations  . Propoxyphene Napsylate Nausea And Vomiting  . Sulfacetamide Sodium-Sulfur Nausea And Vomiting  . Sulfamethoxazole-Trimethoprim Nausea Only    Can't remember reaction  . Zolpidem     Other reaction(s): Mental Status Changes (intolerance)    Allergies as of 01/12/2016      Reactions   Codeine Sulfate Nausea And Vomiting   Darvon [propoxyphene] Nausea And Vomiting   hallucinations   Propoxyphene Napsylate Nausea And Vomiting   Sulfacetamide Sodium-sulfur Nausea And Vomiting   Sulfamethoxazole-trimethoprim Nausea Only   Can't remember reaction   Zolpidem    Other reaction(s): Mental Status Changes (intolerance)      Medication List       Accurate as of 01/12/16 11:59 PM. Always use your most recent med list.  acetaminophen 325 MG tablet Commonly known as:  TYLENOL Take 2 tablets (650 mg total) by mouth every 6 (six) hours as needed for mild pain (or Fever >/= 101).   albuterol 108 (90 Base) MCG/ACT inhaler Commonly known as:  PROVENTIL HFA;VENTOLIN HFA Inhale 1-2 puffs into the lungs every 6 (six) hours as needed for wheezing or shortness of breath.   aspirin EC 81 MG tablet Take 1 tablet (81 mg total) by mouth daily.   atorvastatin 40 MG tablet Commonly known  as:  LIPITOR Take 40 mg by mouth every other day.   b complex vitamins capsule Take 1 capsule by mouth daily.   Cholecalciferol 1000 units tablet Commonly known as:  EQL VITAMIN D3 Take 1 tablet (1,000 Units total) by mouth daily.   darbepoetin 100 MCG/0.5ML Soln injection Commonly known as:  ARANESP Inject 100 mcg into the skin every 8 (eight) weeks.   escitalopram 5 MG tablet Commonly known as:  LEXAPRO Take 1 tablet (5 mg total) by mouth daily.   famotidine 10 MG tablet Commonly known as:  PEPCID Take 1 tablet (10 mg total) by mouth at bedtime.   furosemide 20 MG tablet Commonly known as:  LASIX Take 1 tablet (20 mg total) by mouth daily as needed for fluid or edema.   HYDROcodone-acetaminophen 5-325 MG tablet Commonly known as:  NORCO/VICODIN Take 1-2 tablets by mouth every 4 (four) hours as needed for moderate pain.   levothyroxine 50 MCG tablet Commonly known as:  SYNTHROID, LEVOTHROID TAKE 1 TABLET BY MOUTH EVERY DAY BEFORE BREAKFAST   metoprolol tartrate 25 MG tablet Commonly known as:  LOPRESSOR Take 1 tablet (25 mg total) by mouth 2 (two) times daily.   polyethylene glycol packet Commonly known as:  MIRALAX / GLYCOLAX Take 17 g by mouth daily as needed for mild constipation.   sennosides-docusate sodium 8.6-50 MG tablet Commonly known as:  SENOKOT-S Take 1 tablet by mouth daily.       Review of Systems  Constitutional: Positive for fatigue. Negative for activity change, appetite change, chills and unexpected weight change.  HENT: Negative for congestion, mouth sores and sinus pressure.   Eyes: Negative for visual disturbance.  Respiratory: Negative for cough and chest tightness.   Gastrointestinal: Negative for abdominal pain and nausea.  Genitourinary: Negative for difficulty urinating, frequency and vaginal pain.  Musculoskeletal: Positive for gait problem. Negative for arthralgias and back pain.  Skin: Negative for pallor and rash.  Neurological:  Positive for dizziness and weakness. Negative for tremors, numbness and headaches.  Hematological: Does not bruise/bleed easily.  Psychiatric/Behavioral: Negative for confusion and sleep disturbance. The patient is not nervous/anxious.     Immunization History  Administered Date(s) Administered  . H1N1 12/28/2007  . Influenza Split 10/22/2011  . Influenza, High Dose Seasonal PF 10/09/2015  . Influenza,inj,Quad PF,36+ Mos 10/01/2013, 09/14/2014  . Influenza-Unspecified 11/04/2012  . Pneumococcal Conjugate-13 12/19/2014  . Pneumococcal Polysaccharide-23 07/01/2012   Pertinent  Health Maintenance Due  Topic Date Due  . DEXA SCAN  04/10/1998  . INFLUENZA VACCINE  Completed  . PNA vac Low Risk Adult  Completed   Fall Risk  06/07/2015 03/20/2015 12/19/2014 11/15/2013  Falls in the past year? No No No Yes  Number falls in past yr: - - - 1  Injury with Fall? - - - Yes  Risk Factor Category  - - - High Fall Risk   Functional Status Survey:    Vitals:   01/12/16 1151  BP: (!) 158/75  Pulse: 69  Temp: 97.8 F (36.6 C)  SpO2: 98%  Weight: 164 lb 9.6 oz (74.7 kg)   Body mass index is 29.16 kg/m. Physical Exam  Constitutional: She appears well-developed. No distress.  HENT:  Head: Normocephalic.  Right Ear: External ear normal.  Left Ear: External ear normal.  Nose: Nose normal.  Mouth/Throat: Oropharynx is clear and moist.  Eyes: Conjunctivae are normal. Pupils are equal, round, and reactive to light. Right eye exhibits no discharge. Left eye exhibits no discharge.  Neck: Normal range of motion. Neck supple. No JVD present. No tracheal deviation present. No thyromegaly present.  Cardiovascular: Normal rate, regular rhythm and normal heart sounds.   Pulmonary/Chest: No stridor. No respiratory distress. She has no wheezes.  Abdominal: Soft. Bowel sounds are normal. She exhibits no distension and no mass. There is no tenderness. There is no rebound and no guarding.    Musculoskeletal: She exhibits tenderness. She exhibits no edema.  In the right hip and thigh area with weight bearing and movement.   Lymphadenopathy:    She has no cervical adenopathy.  Neurological: She displays normal reflexes. No cranial nerve deficit. She exhibits normal muscle tone. Coordination abnormal.  Skin: No rash noted. No erythema.  Psychiatric: She has a normal mood and affect. Her behavior is normal. Judgment and thought content normal.    Labs reviewed:  Recent Labs  01/08/16 0415 01/10/16 0420 01/11/16 0426  NA 137 134* 136  K 4.1 3.9 4.1  CL 102 100* 102  CO2 '26 26 27  ' GLUCOSE 95 93 89  BUN 33* 36* 32*  CREATININE 1.98* 1.89* 1.75*  CALCIUM 8.8* 8.9 9.0  MG  --   --  2.2  PHOS  --   --  3.9    Recent Labs  06/07/15 1239 10/21/15 1843 01/11/16 0426  AST '22 31 22  ' ALT 14 17 12*  ALKPHOS 92 86 67  BILITOT 0.6 1.1 0.8  PROT 7.0 7.8 6.5  ALBUMIN 3.9 4.2 3.1*    Recent Labs  01/07/16 1034 01/08/16 0415 01/10/16 0420 01/11/16 0426  WBC 8.4 7.3 8.8 6.3  NEUTROABS 6.4  --  5.9 3.5  HGB 9.9* 9.4* 9.1* 9.1*  HCT 28.9* 27.8* 26.5* 26.2*  MCV 90.3 91.7 87.7 87.9  PLT 332 329 343 349   Lab Results  Component Value Date   TSH 0.60 06/07/2015   No results found for: HGBA1C Lab Results  Component Value Date   CHOL 154 06/07/2015   HDL 47.60 06/07/2015   LDLCALC 87 06/07/2015   TRIG 96.0 06/07/2015   CHOLHDL 3 06/07/2015    Significant Diagnostic Results in last 30 days:  Ct Lumbar Spine Wo Contrast  Result Date: 01/07/2016 CLINICAL DATA:  Worsening pain in the right hip. Injured turning over and head. EXAM: CT LUMBAR SPINE WITHOUT CONTRAST TECHNIQUE: Multidetector CT imaging of the lumbar spine was performed without intravenous contrast administration. Multiplanar CT image reconstructions were also generated. COMPARISON:  10/21/2015 FINDINGS: Segmentation: 5 lumbar type vertebral bodies. Alignment: No significant malalignment. Vertebrae:  There is a healing partial compression fracture of T11 which was acute on the study of 10/21/2015. Loss of height is 10% or less. Solid bone fusion noted from T10 through T12. Old augmented fracture at T12 which has not changed. Old inferior endplate fracture/ Schmorl's node at L1 which has not changed. Old inferior endplate fracture at L2 has not changed. Old biconcave fracture at L3 has not changed. Minimal superior endplate depression at L4  has not changed. Minimal endplate deformities at L5 narrow but not changed. No evidence of sacral fracture. Paraspinal and other soft tissues: Aortic atherosclerosis. Otherwise negative. Disc levels: L1-2: Retrolisthesis of 3 mm. Mild stenosis of the lateral recesses and foramina. L2-3: Disc bulge and posterior bowing of the posterosuperior endplate of L3. Mild stenosis of both lateral recesses. L3-4: Disc bulge. Facet hypertrophy. Mild stenosis of both lateral recesses. L4-5: Disc bulge. Mild facet hypertrophy. Mild stenosis of both lateral recesses. L5-S1: Endplate osteophytes and mild bulging of the disc. No central canal stenosis. Mild foraminal stenosis because of osteophytic encroachment right more than left. IMPRESSION: No acute fractures seen today. There is a subacute, healing fracture at T11 with loss of height of only 10% or less. This was acute in September. Old augmented fracture at T12. Old un augmented fractures at the other lumbar levels as outlined above. Electronically Signed   By: Nelson Chimes M.D.   On: 01/07/2016 11:28   Ct Hip Right Wo Contrast  Result Date: 01/07/2016 CLINICAL DATA:  Right hip pain increasing last day. EXAM: CT OF THE RIGHT HIP WITHOUT CONTRAST TECHNIQUE: Multidetector CT imaging of the right hip was performed according to the standard protocol. Multiplanar CT image reconstructions were also generated. COMPARISON:  None. FINDINGS: Bones/Joint/Cartilage Mild joint space narrowing and early spurring within the right hip joint. No  evidence of femoral neck fracture. Mild cortical irregularity seen on the coronal reconstructed images in the right superior pubic ramus on image 20 which could reflect nondisplaced superior pubic ramus fracture. This cannot be confirmed on other series. Ligaments Suboptimally assessed by CT. Muscles and Tendons Grossly unremarkable. Soft tissues No acute soft tissue abnormality. Calcifications in the iliac and femoral vessels. IMPRESSION: No evidence of femoral neck fracture. Questionable nondisplaced right superior pubic ramus fracture, only seen on coronal imaging. Early degenerative changes in the right hip. Electronically Signed   By: Rolm Baptise M.D.   On: 01/07/2016 11:38    Assessment/Plan Coronary atherosclerosis Hx of CABG x4 about 2-3 years ago, taking Lipitor and ASA  Essential hypertension Controlled, continue Metoprolol 54m bid, prn Furosemide.   A-fib (HCC) Heart rate is in control, continue Metoprolol, ASA  CHF (congestive heart failure) ([place] Compensated clinically, prn Furosemide, weight daily.   GERD Stable, continue Famotidine 179mqhs.   Constipation Stable, continue Senokot I daily, prn MiraLax.   Hypothyroidism Continue Levothyroxine 5034m update TSH 6   INSOMNIA, PERSISTENT Chronic, taking Lexapro 5mg56mubic ramus fracture (HCC) Pain is more in the right side, continue prn Norco for pain, here for Rehab.   CKD (chronic kidney disease) stage 4, GFR 15-29 ml/min (HCC) 01/10/16 credat 1.8  Anemia in chronic renal disease Aranesp q2mon25monthb 9.1 01/11/16  Anxiety state Managed with Lexapro 5mg  22mt Stable.    Family/ staff Communication: SNF for [place], [place] or [place] when [place].   Labs/tests ordered: CBC BMP TSH

## 2016-01-12 NOTE — Assessment & Plan Note (Signed)
Compensated clinically, prn Furosemide, weight daily.

## 2016-01-12 NOTE — Assessment & Plan Note (Signed)
Pain is more in the right side, continue prn Norco for pain, here for Rehab.

## 2016-01-12 NOTE — Assessment & Plan Note (Signed)
Heart rate is in control, continue Metoprolol, ASA

## 2016-01-12 NOTE — Assessment & Plan Note (Signed)
Chronic, taking Lexapro 5mg 

## 2016-01-12 NOTE — Assessment & Plan Note (Signed)
Controlled, continue Metoprolol 25mg  bid, prn Furosemide.

## 2016-01-16 ENCOUNTER — Non-Acute Institutional Stay (SKILLED_NURSING_FACILITY): Payer: Medicare Other | Admitting: Internal Medicine

## 2016-01-16 ENCOUNTER — Encounter: Payer: Self-pay | Admitting: Internal Medicine

## 2016-01-16 DIAGNOSIS — M810 Age-related osteoporosis without current pathological fracture: Secondary | ICD-10-CM

## 2016-01-16 DIAGNOSIS — N184 Chronic kidney disease, stage 4 (severe): Secondary | ICD-10-CM

## 2016-01-16 DIAGNOSIS — G8929 Other chronic pain: Secondary | ICD-10-CM | POA: Diagnosis not present

## 2016-01-16 DIAGNOSIS — I10 Essential (primary) hypertension: Secondary | ICD-10-CM | POA: Diagnosis not present

## 2016-01-16 DIAGNOSIS — I2510 Atherosclerotic heart disease of native coronary artery without angina pectoris: Secondary | ICD-10-CM

## 2016-01-16 DIAGNOSIS — I5022 Chronic systolic (congestive) heart failure: Secondary | ICD-10-CM

## 2016-01-16 DIAGNOSIS — D631 Anemia in chronic kidney disease: Secondary | ICD-10-CM

## 2016-01-16 DIAGNOSIS — F4321 Adjustment disorder with depressed mood: Secondary | ICD-10-CM | POA: Diagnosis not present

## 2016-01-16 DIAGNOSIS — S329XXS Fracture of unspecified parts of lumbosacral spine and pelvis, sequela: Secondary | ICD-10-CM | POA: Diagnosis not present

## 2016-01-16 DIAGNOSIS — K219 Gastro-esophageal reflux disease without esophagitis: Secondary | ICD-10-CM | POA: Diagnosis not present

## 2016-01-16 DIAGNOSIS — M5440 Lumbago with sciatica, unspecified side: Secondary | ICD-10-CM | POA: Diagnosis not present

## 2016-01-16 NOTE — Progress Notes (Signed)
 History and Physical       Location:  [facility] Room Number: N33 Place of Service:  SNF (31)  PCP: Arthur Green, MD Patient Care Team: Arthur G Green, MD as PCP - General (Internal Medicine) Peter C Nishan, MD as Consulting Physician (Cardiology)  Extended Emergency Contact Information Primary Emergency Contact: Kirkpatrick,Pamela          [place], [place] Home Phone: [phone] Relation: Daughter Secondary Emergency Contact: Wittmer,Patricia  [place] Mobile Phone: [phone] Relation: Daughter  Code Status: Full code Goals of Care: Advanced Directive information Advanced Directives 01/16/2016  Does Patient Have a Medical Advance Directive? Yes  Type of Advance Directive Healthcare Power of Attorney;Living will  Does patient want to make changes to medical advance directive? -  Copy of Healthcare Power of Attorney in Chart? Yes  Would patient like information on creating a medical advance directive? -  Pre-existing out of facility DNR order (yellow form or pink MOST form) -      Chief Complaint  Patient presents with  . New Admit To SNF    following hospitalization 01/07/16 to 01/11/16 for pubic  ramus fracture.     HPI: Patient is a 82 y.o. female seen today for admission to [facility] SNF following hospitalization from 01/07/16 to 01/18/16 for fracture of pubic rami. She had a fall about 3 months prior to the hospitalization. She was seen in the ER. CT of the pelvis and abd stated thst no pelvic fractures were found. On 12.16/17, she rolled over in bed and heard a pop. By the next day, she had pain severe enough to keep her from walking. She was taken to the ER and a fracture of the right superior pubic ramus was identified. Pain was controlled with Hydrocodone.  She complained of back pains. Old fractures of T11, T12, L1, L2, L3, and L4 were noted. Stenoses of lateral recesses, and foramina were noted at  multiple levels.  Patient was noted to be weak and deconditioned.  Possible UTI was treated with Rocephin x 5d.  Other stable problems.included CKD, depression and anxiety, anemia, systolic HF, hypothyroidism, GERD, PAF.  Patient was previously living independently. She is admitted to the SNF for pain control, supportive care, PT, OT, and strngthening. She must imorve safe mobility and self care skills prior to moving to lower care level.  Past Medical History:  Diagnosis Date  . Anemia, unspecified   . Anxiety state, unspecified   . Depressive disorder, not elsewhere classified   . Diverticulosis of colon (without mention of hemorrhage)   . Diverticulosis of colon with hemorrhage   . Esophageal reflux   . Lumbago   . Myocardial infarction   . Other and unspecified hyperlipidemia   . Persistent disorder of initiating or maintaining sleep   . Thyroid disease   . Unspecified asthma(493.90)   . Unspecified disorder resulting from impaired renal function   . Unspecified essential hypertension    Dr Nishan  . Unspecified osteomyelitis, site unspecified   . Unspecified vitamin D deficiency    Past Surgical History:  Procedure Laterality Date  . BONE MARROW BIOPSY    . CORONARY ARTERY BYPASS GRAFT  03/25/13   x4. CABS ON PUMP-Surgeon Neal Kon.  . PARTIAL COLECTOMY  1995   diverticulosis of colon iwth hemorrhage.   . status post right carotid enterectomy      reports that she has never smoked. She has never used smokeless tobacco.   She reports that she drinks alcohol. She reports that she does not use drugs. Social History   Social History  . Marital status: Divorced    Spouse name: N/A  . Number of children: N/A  . Years of education: N/A   Occupational History  . Not on file.   Social History Main Topics  . Smoking status: Never Smoker  . Smokeless tobacco: Never Used  . Alcohol use 0.0 oz/week    1 - 2 Shots of liquor per week  . Drug use: No  . Sexual activity: No    Other Topics Concern  . Not on file   Social History Narrative   Admitted to skill unit [facility] 01/11/16   Divorced   Never smoked   Alcohol 1-2 drinks  Of liquor weekly   POA          Functional Status Survey:    Family History  Problem Relation Age of Onset  . Hypertension Other   . COPD Mother   . Cancer Father     lymphoma    Health Maintenance  Topic Date Due  . TETANUS/TDAP  04/09/1952  . ZOSTAVAX  04/09/1993  . DEXA SCAN  04/10/1998  . INFLUENZA VACCINE  Completed  . PNA vac Low Risk Adult  Completed    Allergies  Allergen Reactions  . Codeine Sulfate Nausea And Vomiting  . Darvon [Propoxyphene] Nausea And Vomiting    hallucinations  . Propoxyphene Napsylate Nausea And Vomiting  . Sulfacetamide Sodium-Sulfur Nausea And Vomiting  . Sulfamethoxazole-Trimethoprim Nausea Only    Can't remember reaction  . Zolpidem     Other reaction(s): Mental Status Changes (intolerance)    Allergies as of 01/16/2016      Reactions   Codeine Sulfate Nausea And Vomiting   Darvon [propoxyphene] Nausea And Vomiting   hallucinations   Propoxyphene Napsylate Nausea And Vomiting   Sulfacetamide Sodium-sulfur Nausea And Vomiting   Sulfamethoxazole-trimethoprim Nausea Only   Can't remember reaction   Zolpidem    Other reaction(s): Mental Status Changes (intolerance)      Medication List       Accurate as of 01/16/16  4:39 PM. Always use your most recent med list.          acetaminophen 325 MG tablet Commonly known as:  TYLENOL Take 2 tablets (650 mg total) by mouth every 6 (six) hours as needed for mild pain (or Fever >/= 101).   albuterol 108 (90 Base) MCG/ACT inhaler Commonly known as:  PROVENTIL HFA;VENTOLIN HFA Inhale 1-2 puffs into the lungs every 6 (six) hours as needed for wheezing or shortness of breath.   aspirin EC 81 MG tablet Take 1 tablet (81 mg total) by mouth daily.   atorvastatin 40 MG tablet Commonly known as:  LIPITOR Take 40  mg by mouth every other day.   b complex vitamins capsule Take 1 capsule by mouth daily.   Cholecalciferol 1000 units tablet Commonly known as:  EQL VITAMIN D3 Take 1 tablet (1,000 Units total) by mouth daily.   darbepoetin 100 MCG/0.5ML Soln injection Commonly known as:  ARANESP Inject 100 mcg into the skin every 8 (eight) weeks.   escitalopram 5 MG tablet Commonly known as:  LEXAPRO Take 1 tablet (5 mg total) by mouth daily.   famotidine 10 MG tablet Commonly known as:  PEPCID Take 1 tablet (10 mg total) by mouth at bedtime.   furosemide 20 MG tablet Commonly known as:  LASIX Take 1 tablet (20   mg total) by mouth daily as needed for fluid or edema.   HYDROcodone-acetaminophen 5-325 MG tablet Commonly known as:  NORCO/VICODIN Take 1-2 tablets by mouth every 4 (four) hours as needed for moderate pain.   levothyroxine 50 MCG tablet Commonly known as:  SYNTHROID, LEVOTHROID TAKE 1 TABLET BY MOUTH EVERY DAY BEFORE BREAKFAST   metoprolol tartrate 25 MG tablet Commonly known as:  LOPRESSOR Take 1 tablet (25 mg total) by mouth 2 (two) times daily.   polyethylene glycol packet Commonly known as:  MIRALAX / GLYCOLAX Take 17 g by mouth daily as needed for mild constipation.   sennosides-docusate sodium 8.6-50 MG tablet Commonly known as:  SENOKOT-S Take 1 tablet by mouth daily.       Review of Systems  Constitutional: Positive for fatigue. Negative for activity change, appetite change, chills and unexpected weight change.  HENT: Negative for congestion, mouth sores, sinus pressure and trouble swallowing.   Eyes: Negative for visual disturbance.  Respiratory: Negative for cough and chest tightness.   Cardiovascular:       CAD  Gastrointestinal: Positive for constipation. Negative for abdominal pain and nausea.  Endocrine:       Hx throid nodule  Genitourinary: Negative for difficulty urinating, frequency and vaginal pain.       CKD  Musculoskeletal: Positive for back  pain and gait problem. Negative for arthralgias.       Osteoporosis. Right superior pubic ramus fx, multiple fx of lower thoracic and lumbar vertebrae.  Skin: Negative for pallor and rash.  Neurological: Positive for dizziness and weakness. Negative for tremors, numbness and headaches.  Hematological: Does not bruise/bleed easily.       Chronic anemia  Psychiatric/Behavioral: Negative for confusion and sleep disturbance. The patient is nervous/anxious.     Vitals:   01/16/16 1620  BP: 135/74  Pulse: 78  Resp: 17  Temp: 97.4 F (36.3 C)  SpO2: 98%  Weight: 165 lb (74.8 kg)  Height: 5' 3" (1.6 m)   Body mass index is 29.23 kg/m. Physical Exam  Constitutional: She appears well-developed. No distress.  HENT:  Head: Normocephalic.  Right Ear: External ear normal.  Left Ear: External ear normal.  Nose: Nose normal.  Mouth/Throat: Oropharynx is clear and moist.  Eyes: Conjunctivae are normal. Pupils are equal, round, and reactive to light. Right eye exhibits no discharge. Left eye exhibits no discharge.  Neck: Normal range of motion. Neck supple. No JVD present. No tracheal deviation present. No thyromegaly present.  Cardiovascular: Normal rate, regular rhythm and normal heart sounds.   Pulmonary/Chest: No stridor. No respiratory distress. She has no wheezes.  Abdominal: Soft. Bowel sounds are normal. She exhibits no distension and no mass. There is no tenderness. There is no rebound and no guarding.  Musculoskeletal: She exhibits tenderness (In the right hip and thigh area with weight bearing and movement. ). She exhibits no edema.  Lymphadenopathy:    She has no cervical adenopathy.  Neurological: She displays normal reflexes. No cranial nerve deficit. She exhibits normal muscle tone. Coordination abnormal.  Skin: No rash noted. No erythema.  Psychiatric: She has a normal mood and affect. Her behavior is normal. Judgment and thought content normal.  Cheerful and talkative     Labs reviewed: Basic Metabolic Panel:  Recent Labs  01/08/16 0415 01/10/16 0420 01/11/16 0426  NA 137 134* 136  K 4.1 3.9 4.1  CL 102 100* 102  CO2 _0 GLUCOSE 95 93 89  BUN 33* 36*  32*  CREATININE 1.98* 1.89* 1.75*  CALCIUM 8.8* 8.9 9.0  MG  --   --  2.2  PHOS  --   --  3.9   Liver Function Tests:  Recent Labs  06/07/15 1239 10/21/15 1843 01/11/16 0426  AST _0 ALT 14 17 12*  ALKPHOS 92 86 67  BILITOT 0.6 1.1 0.8  PROT 7.0 7.8 6.5  ALBUMIN 3.9 4.2 3.1*    Recent Labs  10/21/15 1843  LIPASE 24   No results for input(s): AMMONIA in the last 8760 hours. CBC:  Recent Labs  01/07/16 1034 01/08/16 0415 01/10/16 0420 01/11/16 0426  WBC 8.4 7.3 8.8 6.3  NEUTROABS 6.4  --  5.9 3.5  HGB 9.9* 9.4* 9.1* 9.1*  HCT 28.9* 27.8* 26.5* 26.2*  MCV 90.3 91.7 87.7 87.9  PLT 332 329 343 349   Cardiac Enzymes: No results for input(s): CKTOTAL, CKMB, CKMBINDEX, TROPONINI in the last 8760 hours. BNP: Invalid input(s): POCBNP No results found for: HGBA1C Lab Results  Component Value Date   TSH 0.60 06/07/2015   Lab Results  Component Value Date   VITAMINB12 1,417 (H) 10/01/2013   Lab Results  Component Value Date   FOLATE  09/08/2008    15.0 (NOTE)  Reference Ranges        Deficient:       0.4 - 3.3 ng/mL        Indeterminate:   3.4 - 5.4 ng/mL        Normal:              > 5.4 ng/mL   Lab Results  Component Value Date   IRON 75 06/07/2015   TIBC 258 12/29/2014   FERRITIN 159 12/29/2014    Imaging and Procedures obtained prior to SNF admission: Ct Lumbar Spine Wo Contrast  Result Date: 01/07/2016 CLINICAL DATA:  Worsening pain in the right hip. Injured turning over and head. EXAM: CT LUMBAR SPINE WITHOUT CONTRAST TECHNIQUE: Multidetector CT imaging of the lumbar spine was performed without intravenous contrast administration. Multiplanar CT image reconstructions were also generated. COMPARISON:  10/21/2015 FINDINGS: Segmentation: 5 lumbar  type vertebral bodies. Alignment: No significant malalignment. Vertebrae: There is a healing partial compression fracture of T11 which was acute on the study of 10/21/2015. Loss of height is 10% or less. Solid bone fusion noted from T10 through T12. Old augmented fracture at T12 which has not changed. Old inferior endplate fracture/ Schmorl's node at L1 which has not changed. Old inferior endplate fracture at L2 has not changed. Old biconcave fracture at L3 has not changed. Minimal superior endplate depression at L4 has not changed. Minimal endplate deformities at L5 narrow but not changed. No evidence of sacral fracture. Paraspinal and other soft tissues: Aortic atherosclerosis. Otherwise negative. Disc levels: L1-2: Retrolisthesis of 3 mm. Mild stenosis of the lateral recesses and foramina. L2-3: Disc bulge and posterior bowing of the posterosuperior endplate of L3. Mild stenosis of both lateral recesses. L3-4: Disc bulge. Facet hypertrophy. Mild stenosis of both lateral recesses. L4-5: Disc bulge. Mild facet hypertrophy. Mild stenosis of both lateral recesses. L5-S1: Endplate osteophytes and mild bulging of the disc. No central canal stenosis. Mild foraminal stenosis because of osteophytic encroachment right more than left. IMPRESSION: No acute fractures seen today. There is a subacute, healing fracture at T11 with loss of height of only 10% or less. This was acute in September. Old augmented fracture at T12. Old un augmented fractures at the other lumbar levels  as outlined above. Electronically Signed   By: Nelson Chimes M.D.   On: 01/07/2016 11:28   Ct Hip Right Wo Contrast  Result Date: 01/07/2016 CLINICAL DATA:  Right hip pain increasing last day. EXAM: CT OF THE RIGHT HIP WITHOUT CONTRAST TECHNIQUE: Multidetector CT imaging of the right hip was performed according to the standard protocol. Multiplanar CT image reconstructions were also generated. COMPARISON:  None. FINDINGS: Bones/Joint/Cartilage Mild  joint space narrowing and early spurring within the right hip joint. No evidence of femoral neck fracture. Mild cortical irregularity seen on the coronal reconstructed images in the right superior pubic ramus on image 20 which could reflect nondisplaced superior pubic ramus fracture. This cannot be confirmed on other series. Ligaments Suboptimally assessed by CT. Muscles and Tendons Grossly unremarkable. Soft tissues No acute soft tissue abnormality. Calcifications in the iliac and femoral vessels. IMPRESSION: No evidence of femoral neck fracture. Questionable nondisplaced right superior pubic ramus fracture, only seen on coronal imaging. Early degenerative changes in the right hip. Electronically Signed   By: Rolm Baptise M.D.   On: 01/07/2016 11:38    Assessment/Plan 1. Essential hypertension controlled  2. Gastroesophageal reflux disease without esophagitis asymptomatic  3. Pelvis fracture, right, sequela Making good progress  4. Chronic bilateral low back pain with sciatica, sciatica laterality unspecified Pain control is adequate  5. Anemia in stage 4 chronic kidney disease ([place]) Follow lab  6. CKD (chronic kidney disease) stage 4, GFR 15-29 ml/min (HCC) follow lab  7. Atherosclerosis of native coronary artery of native heart without angina pectoris Denies chest apin or dyspnea  8. Chronic systolic congestive heart failure (HCC) compensated  9. Age-related osteoporosis without current pathological fracture Consider use of Prolia as outpatient  10. Situational depression Continue citalopram.

## 2016-01-18 LAB — BASIC METABOLIC PANEL
BUN: 31 mg/dL — AB (ref 4–21)
CREATININE: 1.8 mg/dL — AB (ref <=1.1)
Glucose: 26 mg/dL
Potassium: 4.6 mmol/L (ref 3.4–5.3)
SODIUM: 138 mmol/L (ref 137–147)

## 2016-01-18 LAB — CBC AND DIFFERENTIAL
HCT: 30 % — AB (ref 36–46)
Hemoglobin: 9.8 g/dL — AB (ref 12.0–16.0)
Platelets: 477 10*3/uL — AB (ref 150–399)
WBC: 6 10^3/mL

## 2016-01-19 ENCOUNTER — Telehealth: Payer: Self-pay

## 2016-01-19 ENCOUNTER — Other Ambulatory Visit: Payer: Self-pay | Admitting: *Deleted

## 2016-01-19 MED ORDER — HYDROCODONE-ACETAMINOPHEN 5-325 MG PO TABS: 1.0000 | ORAL_TABLET | ORAL | 0 refills | Status: DC | PRN

## 2016-01-19 NOTE — Telephone Encounter (Signed)
[facility]

## 2016-01-19 NOTE — Telephone Encounter (Signed)
[employer] phone [phone] fax [phone]

## 2016-01-24 ENCOUNTER — Other Ambulatory Visit: Payer: Self-pay | Admitting: *Deleted

## 2016-02-05 ENCOUNTER — Encounter: Payer: Self-pay | Admitting: Internal Medicine

## 2016-02-05 ENCOUNTER — Ambulatory Visit (INDEPENDENT_AMBULATORY_CARE_PROVIDER_SITE_OTHER): Payer: Medicare Other | Admitting: Internal Medicine

## 2016-02-05 DIAGNOSIS — F4321 Adjustment disorder with depressed mood: Secondary | ICD-10-CM

## 2016-02-05 DIAGNOSIS — Z7409 Other reduced mobility: Secondary | ICD-10-CM

## 2016-02-05 DIAGNOSIS — B029 Zoster without complications: Secondary | ICD-10-CM | POA: Diagnosis not present

## 2016-02-05 DIAGNOSIS — I5020 Unspecified systolic (congestive) heart failure: Secondary | ICD-10-CM

## 2016-02-05 DIAGNOSIS — R5382 Chronic fatigue, unspecified: Secondary | ICD-10-CM | POA: Diagnosis not present

## 2016-02-05 DIAGNOSIS — M5440 Lumbago with sciatica, unspecified side: Secondary | ICD-10-CM

## 2016-02-05 DIAGNOSIS — R6889 Other general symptoms and signs: Secondary | ICD-10-CM

## 2016-02-05 DIAGNOSIS — S329XXS Fracture of unspecified parts of lumbosacral spine and pelvis, sequela: Secondary | ICD-10-CM | POA: Insufficient documentation

## 2016-02-05 DIAGNOSIS — S32591A Other specified fracture of right pubis, initial encounter for closed fracture: Secondary | ICD-10-CM

## 2016-02-05 DIAGNOSIS — G8929 Other chronic pain: Secondary | ICD-10-CM

## 2016-02-05 MED ORDER — TRIAMCINOLONE ACETONIDE 0.1 % EX CREA: 1.0000 "application " | TOPICAL_CREAM | Freq: Three times a day (TID) | CUTANEOUS | 0 refills | Status: DC

## 2016-02-05 MED ORDER — VALACYCLOVIR HCL 1 G PO TABS: 1000.0000 mg | ORAL_TABLET | Freq: Three times a day (TID) | ORAL | 0 refills | Status: DC

## 2016-02-05 NOTE — Progress Notes (Signed)
Subjective:  Patient ID: [name] [name], female    DOB: 10/13/1931  Age: 83 y.o. MRN: [id number]  CC: No chief complaint on file.   HPI [name] [name] presents for R pelvic rami fx in 12/17 - was hospitalized, then NHP. F/u CAD, HTN. Back home x 1 week. C/o rash on the R cheek Fri or Sat last week. She had pain earlier on the R side of the head. C/o RLS  Outpatient Medications Prior to Visit  Medication Sig Dispense Refill  . acetaminophen (TYLENOL) 325 MG tablet Take 2 tablets (650 mg total) by mouth every 6 (six) hours as needed for mild pain (or Fever >/= 101). 30 tablet 0  . albuterol (PROVENTIL HFA;VENTOLIN HFA) 108 (90 Base) MCG/ACT inhaler Inhale 1-2 puffs into the lungs every 6 (six) hours as needed for wheezing or shortness of breath. 1 Inhaler 1  . aspirin EC 81 MG tablet Take 1 tablet (81 mg total) by mouth daily. 90 tablet 3  . atorvastatin (LIPITOR) 40 MG tablet Take 40 mg by mouth every other day.    . b complex vitamins capsule Take 1 capsule by mouth daily.    . Cholecalciferol (EQL VITAMIN D3) 1000 UNITS tablet Take 1 tablet (1,000 Units total) by mouth daily. 100 tablet 3  . darbepoetin (ARANESP) 100 MCG/0.5ML SOLN injection Inject 100 mcg into the skin every 8 (eight) weeks.    [name] escitalopram (LEXAPRO) 5 MG tablet Take 1 tablet (5 mg total) by mouth daily. 30 tablet 5  . famotidine (PEPCID) 10 MG tablet Take 1 tablet (10 mg total) by mouth at bedtime. 30 tablet 0  . furosemide (LASIX) 20 MG tablet Take 1 tablet (20 mg total) by mouth daily as needed for fluid or edema. 30 tablet 0  . HYDROcodone-acetaminophen (NORCO/VICODIN) 5-325 MG tablet Take 1-2 tablets by mouth every 4 (four) hours as needed for moderate pain. 30 tablet 0  . levothyroxine (SYNTHROID, LEVOTHROID) 50 MCG tablet TAKE 1 TABLET BY MOUTH EVERY DAY BEFORE BREAKFAST 90 tablet 3  . metoprolol tartrate (LOPRESSOR) 25 MG tablet Take 1 tablet (25 mg total) by mouth 2 (two) times daily. 180 tablet 3  .  polyethylene glycol (MIRALAX / GLYCOLAX) packet Take 17 g by mouth daily as needed for mild constipation. 14 each 0  . sennosides-docusate sodium (SENOKOT-S) 8.6-50 MG tablet Take 1 tablet by mouth daily.      Facility-Administered Medications Prior to Visit  Medication Dose Route Frequency Provider Last Rate Last Dose  . Darbepoetin Alfa (ARANESP) injection 500 mcg  500 mcg Subcutaneous Once [name], MD        ROS Review of Systems  Constitutional: Negative for activity change, appetite change, chills, fatigue and unexpected weight change.  HENT: Negative for congestion, mouth sores and sinus pressure.   Eyes: Negative for visual disturbance.  Respiratory: Negative for cough and chest tightness.   Gastrointestinal: Negative for abdominal pain and nausea.  Genitourinary: Negative for difficulty urinating, frequency and vaginal pain.  Musculoskeletal: Positive for gait problem. Negative for back pain.  Skin: Positive for rash. Negative for pallor.  Neurological: Positive for headaches. Negative for dizziness, tremors, weakness and numbness.  Psychiatric/Behavioral: Negative for confusion and sleep disturbance. The patient is nervous/anxious.     Objective:  BP 140/78   Pulse 87   Temp 97.6 F (36.4 C) (Oral)   Wt 162 lb (73.5 kg)   SpO2 97%   BMI 28.70 kg/m   BP Readings from Last  3 Encounters:  02/05/16 140/78  01/16/16 135/74  01/12/16 (!) 158/75    Wt Readings from Last 3 Encounters:  02/05/16 162 lb (73.5 kg)  01/16/16 165 lb (74.8 kg)  01/12/16 164 lb 9.6 oz (74.7 kg)    Physical Exam  Constitutional: She appears well-developed. No distress.  HENT:  Head: Normocephalic.  Right Ear: External ear normal.  Left Ear: External ear normal.  Nose: Nose normal.  Mouth/Throat: Oropharynx is clear and moist.  Eyes: Conjunctivae are normal. Pupils are equal, round, and reactive to light. Right eye exhibits no discharge. Left eye exhibits no discharge.  Neck: Normal  range of motion. Neck supple. No JVD present. No tracheal deviation present. No thyromegaly present.  Cardiovascular: Normal rate, regular rhythm and normal heart sounds.   Pulmonary/Chest: No stridor. No respiratory distress. She has no wheezes.  Abdominal: Soft. Bowel sounds are normal. She exhibits no distension and no mass. There is no tenderness. There is no rebound and no guarding.  Musculoskeletal: She exhibits tenderness. She exhibits no edema.  Lymphadenopathy:    She has no cervical adenopathy.  Neurological: She displays normal reflexes. No cranial nerve deficit. She exhibits normal muscle tone. Coordination abnormal.  Skin: Rash noted. No erythema.  Psychiatric: She has a normal mood and affect. Her behavior is normal. Judgment and thought content normal.  walker R cheek rash   Lab Results  Component Value Date   WBC 6.0 01/18/2016   HGB 9.8 (A) 01/18/2016   HCT 30 (A) 01/18/2016   PLT 477 (A) 01/18/2016   GLUCOSE 89 01/11/2016   CHOL 154 06/07/2015   TRIG 96.0 06/07/2015   HDL 47.60 06/07/2015   LDLCALC 87 06/07/2015   ALT 12 (L) 01/11/2016   AST 22 01/11/2016   NA 138 01/18/2016   K 4.6 01/18/2016   CL 102 01/11/2016   CREATININE 1.8 (A) 01/18/2016   BUN 31 (A) 01/18/2016   CO2 27 01/11/2016   TSH 0.60 06/07/2015   INR 0.98 05/03/2014    Ct Lumbar Spine Wo Contrast  Result Date: 01/07/2016 CLINICAL DATA:  Worsening pain in the right hip. Injured turning over and head. EXAM: CT LUMBAR SPINE WITHOUT CONTRAST TECHNIQUE: Multidetector CT imaging of the lumbar spine was performed without intravenous contrast administration. Multiplanar CT image reconstructions were also generated. COMPARISON:  10/21/2015 FINDINGS: Segmentation: 5 lumbar type vertebral bodies. Alignment: No significant malalignment. Vertebrae: There is a healing partial compression fracture of T11 which was acute on the study of 10/21/2015. Loss of height is 10% or less. Solid bone fusion noted from  T10 through T12. Old augmented fracture at T12 which has not changed. Old inferior endplate fracture/ Schmorl's node at L1 which has not changed. Old inferior endplate fracture at L2 has not changed. Old biconcave fracture at L3 has not changed. Minimal superior endplate depression at L4 has not changed. Minimal endplate deformities at L5 narrow but not changed. No evidence of sacral fracture. Paraspinal and other soft tissues: Aortic atherosclerosis. Otherwise negative. Disc levels: L1-2: Retrolisthesis of 3 mm. Mild stenosis of the lateral recesses and foramina. L2-3: Disc bulge and posterior bowing of the posterosuperior endplate of L3. Mild stenosis of both lateral recesses. L3-4: Disc bulge. Facet hypertrophy. Mild stenosis of both lateral recesses. L4-5: Disc bulge. Mild facet hypertrophy. Mild stenosis of both lateral recesses. L5-S1: Endplate osteophytes and mild bulging of the disc. No central canal stenosis. Mild foraminal stenosis because of osteophytic encroachment right more than left. IMPRESSION: No acute fractures seen  today. There is a subacute, healing fracture at T11 with loss of height of only 10% or less. This was acute in September. Old augmented fracture at T12. Old un augmented fractures at the other lumbar levels as outlined above. Electronically Signed   By: Nelson Chimes M.D.   On: 01/07/2016 11:28   Ct Hip Right Wo Contrast  Result Date: 01/07/2016 CLINICAL DATA:  Right hip pain increasing last day. EXAM: CT OF THE RIGHT HIP WITHOUT CONTRAST TECHNIQUE: Multidetector CT imaging of the right hip was performed according to the standard protocol. Multiplanar CT image reconstructions were also generated. COMPARISON:  None. FINDINGS: Bones/Joint/Cartilage Mild joint space narrowing and early spurring within the right hip joint. No evidence of femoral neck fracture. Mild cortical irregularity seen on the coronal reconstructed images in the right superior pubic ramus on image 20 which could  reflect nondisplaced superior pubic ramus fracture. This cannot be confirmed on other series. Ligaments Suboptimally assessed by CT. Muscles and Tendons Grossly unremarkable. Soft tissues No acute soft tissue abnormality. Calcifications in the iliac and femoral vessels. IMPRESSION: No evidence of femoral neck fracture. Questionable nondisplaced right superior pubic ramus fracture, only seen on coronal imaging. Early degenerative changes in the right hip. Electronically Signed   By: Rolm Baptise M.D.   On: 01/07/2016 11:38    Assessment & Plan:   There are no diagnoses linked to this encounter. I am having Ms. Wanke maintain her sennosides-docusate sodium, b complex vitamins, darbepoetin, Cholecalciferol, albuterol, levothyroxine, atorvastatin, metoprolol tartrate, aspirin EC, escitalopram, famotidine, furosemide, acetaminophen, polyethylene glycol, and HYDROcodone-acetaminophen.  No orders of the defined types were placed in this encounter.    Follow-up: No Follow-up on file.  [name], MD

## 2016-02-05 NOTE — Assessment & Plan Note (Signed)
PT at home 

## 2016-02-05 NOTE — Assessment & Plan Note (Signed)
See Ophth ASAP Valtrex po

## 2016-02-05 NOTE — Assessment & Plan Note (Signed)
Pelvic fx -- PT at home

## 2016-02-05 NOTE — Assessment & Plan Note (Signed)
Lexapro 

## 2016-02-05 NOTE — Assessment & Plan Note (Signed)
Treat other problems

## 2016-02-05 NOTE — Assessment & Plan Note (Signed)
Lopressor Furosemide prn

## 2016-02-05 NOTE — Assessment & Plan Note (Signed)
Home PT

## 2016-02-05 NOTE — Progress Notes (Signed)
Pre visit review using our clinic review tool, if applicable. No additional management support is needed unless otherwise documented below in the visit note. 

## 2016-02-08 DIAGNOSIS — I5020 Unspecified systolic (congestive) heart failure: Secondary | ICD-10-CM | POA: Diagnosis not present

## 2016-02-08 DIAGNOSIS — Z7982 Long term (current) use of aspirin: Secondary | ICD-10-CM | POA: Diagnosis not present

## 2016-02-08 DIAGNOSIS — I2510 Atherosclerotic heart disease of native coronary artery without angina pectoris: Secondary | ICD-10-CM | POA: Diagnosis not present

## 2016-02-08 DIAGNOSIS — S32591D Other specified fracture of right pubis, subsequent encounter for fracture with routine healing: Secondary | ICD-10-CM | POA: Diagnosis not present

## 2016-02-08 DIAGNOSIS — I110 Hypertensive heart disease with heart failure: Secondary | ICD-10-CM | POA: Diagnosis not present

## 2016-02-08 DIAGNOSIS — B0230 Zoster ocular disease, unspecified: Secondary | ICD-10-CM | POA: Diagnosis not present

## 2016-02-09 ENCOUNTER — Telehealth: Payer: Self-pay | Admitting: *Deleted

## 2016-02-09 NOTE — Telephone Encounter (Signed)
Caller left vm stating she evalueted pt yesterday for PT.  She is requesting verbal orders for 2 x weekly for 3 wks. Please advise.

## 2016-02-12 DIAGNOSIS — B029 Zoster without complications: Secondary | ICD-10-CM | POA: Diagnosis not present

## 2016-02-12 NOTE — Telephone Encounter (Signed)
Ok Thx 

## 2016-02-13 DIAGNOSIS — I5020 Unspecified systolic (congestive) heart failure: Secondary | ICD-10-CM | POA: Diagnosis not present

## 2016-02-13 DIAGNOSIS — B0230 Zoster ocular disease, unspecified: Secondary | ICD-10-CM | POA: Diagnosis not present

## 2016-02-13 DIAGNOSIS — Z7982 Long term (current) use of aspirin: Secondary | ICD-10-CM | POA: Diagnosis not present

## 2016-02-13 DIAGNOSIS — S32591D Other specified fracture of right pubis, subsequent encounter for fracture with routine healing: Secondary | ICD-10-CM | POA: Diagnosis not present

## 2016-02-13 DIAGNOSIS — I110 Hypertensive heart disease with heart failure: Secondary | ICD-10-CM | POA: Diagnosis not present

## 2016-02-13 DIAGNOSIS — I2510 Atherosclerotic heart disease of native coronary artery without angina pectoris: Secondary | ICD-10-CM | POA: Diagnosis not present

## 2016-02-13 NOTE — Telephone Encounter (Signed)
Verbal orders given to [employer].

## 2016-02-14 ENCOUNTER — Telehealth: Payer: Self-pay | Admitting: Internal Medicine

## 2016-02-14 NOTE — Telephone Encounter (Signed)
°  Patient Name: [name] [name]  DOB: 08-25-1931    Initial Comment Caller states she had a mild case of shingles on right eye, seen at office. She is now having two issues of gout on right foot in past, and left foot is swollen, no bright red toe yet, but pain and discomfort. Maintenance Colchicine for gout is what she has. 0.6mg . She is wanting to know if she can take it, until it goes away, or does she need to be seen.    Nurse Assessment  Nurse: Julien Girt, RN, Almyra Free Date/Time Eilene Ghazi Time): 02/14/2016 1:15:15 PM  Confirm and document reason for call. If symptomatic, describe symptoms. ---Caller states she thinks she has gout in her left foot. Her foot is swollen from her toes to ankle and is painful. She does not have the pain in the great toe like she does on the right foot. The pain is worse on the right. She does have Colchicine, but was directed to call her practitioner prior to starting her medication. Her sx began 2 days ago and worsened during the night. She has never had sx on the right foot.  Does the patient have any new or worsening symptoms? ---Yes  Will a triage be completed? ---Yes  Related visit to physician within the last 2 weeks? ---No  Does the PT have any chronic conditions? (i.e. diabetes, asthma, etc.) ---Yes  List chronic conditions. ---Hx Gout, Currently has shingles rt eye( has been seen and treated), Htn, Hypothyroid  Is this a behavioral health or substance abuse call? ---No     Guidelines    Guideline Title Affirmed Question Affirmed Notes  Foot Pain [1] Swollen foot AND [2] no fever (Exceptions: localized bump from bunions, calluses, insect bite, sting)    Final Disposition User   See Physician within 24 Hours Julien Girt, RN, Almyra Free    Referrals  REFERRED TO PCP OFFICE   Disagree/Comply: Leta Baptist

## 2016-02-15 ENCOUNTER — Other Ambulatory Visit (INDEPENDENT_AMBULATORY_CARE_PROVIDER_SITE_OTHER): Payer: Medicare Other

## 2016-02-15 ENCOUNTER — Ambulatory Visit (INDEPENDENT_AMBULATORY_CARE_PROVIDER_SITE_OTHER): Payer: Medicare Other | Admitting: Internal Medicine

## 2016-02-15 ENCOUNTER — Encounter: Payer: Self-pay | Admitting: Internal Medicine

## 2016-02-15 VITALS — BP 130/70 | HR 83 | Resp 20 | Wt 163.0 lb

## 2016-02-15 DIAGNOSIS — F411 Generalized anxiety disorder: Secondary | ICD-10-CM | POA: Diagnosis not present

## 2016-02-15 DIAGNOSIS — M109 Gout, unspecified: Secondary | ICD-10-CM

## 2016-02-15 DIAGNOSIS — I10 Essential (primary) hypertension: Secondary | ICD-10-CM | POA: Diagnosis not present

## 2016-02-15 LAB — URIC ACID: Uric Acid, Serum: 7.4 mg/dL — ABNORMAL HIGH (ref 2.4–7.0)

## 2016-02-15 MED ORDER — METHYLPREDNISOLONE ACETATE 80 MG/ML IJ SUSP
80.0000 mg | Freq: Once | INTRAMUSCULAR | Status: AC
  Administered 2016-02-15: 80 mg via INTRAMUSCULAR

## 2016-02-15 MED ORDER — ALLOPURINOL 100 MG PO TABS
100.0000 mg | ORAL_TABLET | Freq: Every day | ORAL | 3 refills | Status: DC
Start: 2016-02-15 — End: 2017-02-01

## 2016-02-15 MED ORDER — PREDNISONE 10 MG PO TABS: ORAL_TABLET | ORAL | 0 refills | Status: DC

## 2016-02-15 MED ORDER — COLCHICINE 0.6 MG PO TABS: 0.6000 mg | ORAL_TABLET | Freq: Every day | ORAL | 3 refills | Status: DC

## 2016-02-15 NOTE — Progress Notes (Signed)
Subjective:    Patient ID: [name] [name], female    DOB: 01-14-1932, 83 y.o.   MRN: [id number]  HPI  83 yo F with hx of right foot gout approx 1 yr ago several times, today with c/o 2 days onset left great toe joint red/tender/swelling, without fever, red streaks or other trauma.  Pt denies chest pain, increased sob or doe, wheezing, orthopnea, PND, increased LE swelling, palpitations, dizziness or syncope.  Pt denies new neurological symptoms such as new headache, or facial or extremity weakness or numbness   Pt denies polydipsia, polyuria,  Denies worsening depressive symptoms, suicidal ideation, or panic; has ongoing anxiety, Past Medical History:  Diagnosis Date  . Anemia, unspecified   . Anxiety state, unspecified   . Depressive disorder, not elsewhere classified   . Diverticulosis of colon (without mention of hemorrhage)   . Diverticulosis of colon with hemorrhage   . Esophageal reflux   . Lumbago   . Myocardial infarction   . Other and unspecified hyperlipidemia   . Persistent disorder of initiating or maintaining sleep   . Thyroid disease   . Unspecified asthma(493.90)   . Unspecified disorder resulting from impaired renal function   . Unspecified essential hypertension    Dr [name]  . Unspecified osteomyelitis, site unspecified   . Unspecified vitamin D deficiency    Past Surgical History:  Procedure Laterality Date  . BONE MARROW BIOPSY    . CORONARY ARTERY BYPASS GRAFT  03/25/13   x4. CABS ON PUMP-Surgeon Jomarie Longs.  [name] PARTIAL COLECTOMY  1995   diverticulosis of colon iwth hemorrhage.   . status post right carotid enterectomy      reports that she has never smoked. She has never used smokeless tobacco. She reports that she drinks alcohol. She reports that she does not use drugs. family history includes COPD in her mother; Cancer in her father; Hypertension in her other. Allergies  Allergen Reactions  . Codeine Sulfate Nausea And Vomiting  . Darvon [Propoxyphene]  Nausea And Vomiting    hallucinations  . Propoxyphene Napsylate Nausea And Vomiting  . Sulfacetamide Sodium-Sulfur Nausea And Vomiting  . Sulfamethoxazole-Trimethoprim Nausea Only    Can't remember reaction  . Zolpidem     Other reaction(s): Mental Status Changes (intolerance)   Current Outpatient Prescriptions on File Prior to Visit  Medication Sig Dispense Refill  . acetaminophen (TYLENOL) 325 MG tablet Take 2 tablets (650 mg total) by mouth every 6 (six) hours as needed for mild pain (or Fever >/= 101). 30 tablet 0  . albuterol (PROVENTIL HFA;VENTOLIN HFA) 108 (90 Base) MCG/ACT inhaler Inhale 1-2 puffs into the lungs every 6 (six) hours as needed for wheezing or shortness of breath. 1 Inhaler 1  . aspirin EC 81 MG tablet Take 1 tablet (81 mg total) by mouth daily. 90 tablet 3  . atorvastatin (LIPITOR) 40 MG tablet Take 40 mg by mouth every other day.    . b complex vitamins capsule Take 1 capsule by mouth daily.    . Cholecalciferol (EQL VITAMIN D3) 1000 UNITS tablet Take 1 tablet (1,000 Units total) by mouth daily. 100 tablet 3  . darbepoetin (ARANESP) 100 MCG/0.5ML SOLN injection Inject 100 mcg into the skin every 8 (eight) weeks.    [name] escitalopram (LEXAPRO) 5 MG tablet Take 1 tablet (5 mg total) by mouth daily. 30 tablet 5  . famotidine (PEPCID) 10 MG tablet Take 1 tablet (10 mg total) by mouth at bedtime. 30 tablet 0  .  furosemide (LASIX) 20 MG tablet Take 1 tablet (20 mg total) by mouth daily as needed for fluid or edema. 30 tablet 0  . HYDROcodone-acetaminophen (NORCO/VICODIN) 5-325 MG tablet Take 1-2 tablets by mouth every 4 (four) hours as needed for moderate pain. 30 tablet 0  . levothyroxine (SYNTHROID, LEVOTHROID) 50 MCG tablet TAKE 1 TABLET BY MOUTH EVERY DAY BEFORE BREAKFAST 90 tablet 3  . metoprolol tartrate (LOPRESSOR) 25 MG tablet Take 1 tablet (25 mg total) by mouth 2 (two) times daily. 180 tablet 3  . polyethylene glycol (MIRALAX / GLYCOLAX) packet Take 17 g by mouth  daily as needed for mild constipation. 14 each 0  . sennosides-docusate sodium (SENOKOT-S) 8.6-50 MG tablet Take 1 tablet by mouth daily.     [name] triamcinolone cream (KENALOG) 0.1 % Apply 1 application topically 3 (three) times daily. 30 g 0  . valACYclovir (VALTREX) 1000 MG tablet Take 1 tablet (1,000 mg total) by mouth 3 (three) times daily. 21 tablet 0  . allopurinol (ZYLOPRIM) 100 MG tablet Take 1 tablet (100 mg total) by mouth daily. 90 tablet 3   Current Facility-Administered Medications on File Prior to Visit  Medication Dose Route Frequency Provider Last Rate Last Dose  . Darbepoetin Alfa (ARANESP) injection 500 mcg  500 mcg Subcutaneous Once [name], MD       Review of Systems  Constitutional: Negative for unusual diaphoresis or night sweats HENT: Negative for ear swelling or discharge Eyes: Negative for worsening visual haziness  Respiratory: Negative for choking and stridor.   Gastrointestinal: Negative for distension or worsening eructation Genitourinary: Negative for retention or change in urine volume.  Musculoskeletal: Negative for other MSK pain or swelling Skin: Negative for color change and worsening wound Neurological: Negative for tremors and numbness other than noted  Psychiatric/Behavioral: Negative for decreased concentration or agitation other than above   All other system neg per pt    Objective:   Physical Exam BP 130/70   Pulse 83   Resp 20   Wt 163 lb (73.9 kg)   SpO2 97%   BMI 28.87 kg/m  VS noted,  Constitutional: Pt appears in no apparent distress HENT: Head: NCAT.  Right Ear: External ear normal.  Left Ear: External ear normal.  Eyes: . Pupils are equal, round, and reactive to light. Conjunctivae and EOM are normal Neck: Normal range of motion. Neck supple.  Cardiovascular: Normal rate and regular rhythm.   Pulmonary/Chest: Effort normal and breath sounds without rales or wheezing.  Neurological: Pt is alert. Not confused , motor grossly  intact Skin: Skin is warm. No rash, no LE edema Psychiatric: Pt behavior is normal. No agitation. 1+ nervous Left first MTP 2+ red/tender/swelling, without red streaks, ulcer or fluctuance    Assessment & Plan:

## 2016-02-15 NOTE — Telephone Encounter (Signed)
Pt left msg on triage stating the Colchicine that was rx a 90 day supply cost $300, and she can not afford. Pt is requesting alternative...[name] [name]

## 2016-02-15 NOTE — Telephone Encounter (Signed)
Ok to change the colchicine prophylaxis to allopurinol 100 qd - done erx

## 2016-02-15 NOTE — Telephone Encounter (Signed)
Notified pt w/MD response.../lmb 

## 2016-02-15 NOTE — Patient Instructions (Signed)
You had the steroid shot today - depomedrol (80 mg)  Please take all new medication as prescribed - the prednisone  Please take all new medication as prescribed - the colchicine every day  A uric acid level has been added to your next blood work  Please continue all other medications as before, and refills have been done if requested.  Please have the pharmacy call with any other refills you may need.  Please keep your appointments with your specialists as you may have planned

## 2016-02-15 NOTE — Progress Notes (Signed)
Pre visit review using our clinic review tool, if applicable. No additional management support is needed unless otherwise documented below in the visit note. 

## 2016-02-16 DIAGNOSIS — I5020 Unspecified systolic (congestive) heart failure: Secondary | ICD-10-CM | POA: Diagnosis not present

## 2016-02-16 DIAGNOSIS — S32591D Other specified fracture of right pubis, subsequent encounter for fracture with routine healing: Secondary | ICD-10-CM | POA: Diagnosis not present

## 2016-02-16 DIAGNOSIS — I2510 Atherosclerotic heart disease of native coronary artery without angina pectoris: Secondary | ICD-10-CM | POA: Diagnosis not present

## 2016-02-16 DIAGNOSIS — Z7982 Long term (current) use of aspirin: Secondary | ICD-10-CM | POA: Diagnosis not present

## 2016-02-16 DIAGNOSIS — I110 Hypertensive heart disease with heart failure: Secondary | ICD-10-CM | POA: Diagnosis not present

## 2016-02-16 DIAGNOSIS — B0230 Zoster ocular disease, unspecified: Secondary | ICD-10-CM | POA: Diagnosis not present

## 2016-02-17 NOTE — Assessment & Plan Note (Signed)
stable overall by history and exam, recent data reviewed with pt, and pt to continue medical treatment as before,  to f/u any worsening symptoms or concerns BP Readings from Last 3 Encounters:  02/15/16 130/70  02/05/16 140/78  01/16/16 135/74

## 2016-02-17 NOTE — Assessment & Plan Note (Signed)
Mild to mod, for depomedrol 80 IM, predpac asd, colchicine prn, tylenol prn pain, uric acid with nexxt labs,  to f/u any worsening symptoms or concerns

## 2016-02-17 NOTE — Assessment & Plan Note (Signed)
Overall stable overall by history and exam, and pt to continue medical treatment as before,  to f/u any worsening symptoms or concerns

## 2016-02-19 DIAGNOSIS — S32591D Other specified fracture of right pubis, subsequent encounter for fracture with routine healing: Secondary | ICD-10-CM | POA: Diagnosis not present

## 2016-02-19 DIAGNOSIS — I5020 Unspecified systolic (congestive) heart failure: Secondary | ICD-10-CM | POA: Diagnosis not present

## 2016-02-19 DIAGNOSIS — Z7982 Long term (current) use of aspirin: Secondary | ICD-10-CM | POA: Diagnosis not present

## 2016-02-19 DIAGNOSIS — B0230 Zoster ocular disease, unspecified: Secondary | ICD-10-CM | POA: Diagnosis not present

## 2016-02-19 DIAGNOSIS — I2510 Atherosclerotic heart disease of native coronary artery without angina pectoris: Secondary | ICD-10-CM | POA: Diagnosis not present

## 2016-02-19 DIAGNOSIS — I110 Hypertensive heart disease with heart failure: Secondary | ICD-10-CM | POA: Diagnosis not present

## 2016-02-21 DIAGNOSIS — S32591D Other specified fracture of right pubis, subsequent encounter for fracture with routine healing: Secondary | ICD-10-CM | POA: Diagnosis not present

## 2016-02-21 DIAGNOSIS — I2510 Atherosclerotic heart disease of native coronary artery without angina pectoris: Secondary | ICD-10-CM | POA: Diagnosis not present

## 2016-02-21 DIAGNOSIS — Z7982 Long term (current) use of aspirin: Secondary | ICD-10-CM | POA: Diagnosis not present

## 2016-02-21 DIAGNOSIS — I110 Hypertensive heart disease with heart failure: Secondary | ICD-10-CM | POA: Diagnosis not present

## 2016-02-21 DIAGNOSIS — B0230 Zoster ocular disease, unspecified: Secondary | ICD-10-CM | POA: Diagnosis not present

## 2016-02-21 DIAGNOSIS — I5020 Unspecified systolic (congestive) heart failure: Secondary | ICD-10-CM | POA: Diagnosis not present

## 2016-02-26 DIAGNOSIS — I2510 Atherosclerotic heart disease of native coronary artery without angina pectoris: Secondary | ICD-10-CM | POA: Diagnosis not present

## 2016-02-26 DIAGNOSIS — I110 Hypertensive heart disease with heart failure: Secondary | ICD-10-CM | POA: Diagnosis not present

## 2016-02-26 DIAGNOSIS — B0230 Zoster ocular disease, unspecified: Secondary | ICD-10-CM | POA: Diagnosis not present

## 2016-02-26 DIAGNOSIS — Z7982 Long term (current) use of aspirin: Secondary | ICD-10-CM | POA: Diagnosis not present

## 2016-02-26 DIAGNOSIS — I5020 Unspecified systolic (congestive) heart failure: Secondary | ICD-10-CM | POA: Diagnosis not present

## 2016-02-26 DIAGNOSIS — S32591D Other specified fracture of right pubis, subsequent encounter for fracture with routine healing: Secondary | ICD-10-CM | POA: Diagnosis not present

## 2016-02-28 ENCOUNTER — Telehealth: Payer: Self-pay | Admitting: *Deleted

## 2016-02-28 ENCOUNTER — Ambulatory Visit: Payer: Self-pay | Admitting: Internal Medicine

## 2016-02-28 MED ORDER — ATORVASTATIN CALCIUM 40 MG PO TABS: 40.0000 mg | ORAL_TABLET | Freq: Every day | ORAL | 1 refills | Status: DC

## 2016-02-28 NOTE — Telephone Encounter (Signed)
r'd fax pt requesting refill on her atorvastatin...[name] [name]

## 2016-03-01 DIAGNOSIS — I5020 Unspecified systolic (congestive) heart failure: Secondary | ICD-10-CM | POA: Diagnosis not present

## 2016-03-01 DIAGNOSIS — B0230 Zoster ocular disease, unspecified: Secondary | ICD-10-CM | POA: Diagnosis not present

## 2016-03-01 DIAGNOSIS — Z7982 Long term (current) use of aspirin: Secondary | ICD-10-CM | POA: Diagnosis not present

## 2016-03-01 DIAGNOSIS — I110 Hypertensive heart disease with heart failure: Secondary | ICD-10-CM | POA: Diagnosis not present

## 2016-03-01 DIAGNOSIS — I2510 Atherosclerotic heart disease of native coronary artery without angina pectoris: Secondary | ICD-10-CM | POA: Diagnosis not present

## 2016-03-01 DIAGNOSIS — S32591D Other specified fracture of right pubis, subsequent encounter for fracture with routine healing: Secondary | ICD-10-CM | POA: Diagnosis not present

## 2016-03-08 ENCOUNTER — Ambulatory Visit: Payer: Self-pay | Admitting: Internal Medicine

## 2016-03-08 ENCOUNTER — Other Ambulatory Visit: Payer: Self-pay | Admitting: *Deleted

## 2016-03-08 DIAGNOSIS — N189 Chronic kidney disease, unspecified: Principal | ICD-10-CM

## 2016-03-08 DIAGNOSIS — D631 Anemia in chronic kidney disease: Secondary | ICD-10-CM

## 2016-03-11 ENCOUNTER — Other Ambulatory Visit (HOSPITAL_BASED_OUTPATIENT_CLINIC_OR_DEPARTMENT_OTHER): Payer: Medicare Other

## 2016-03-11 ENCOUNTER — Ambulatory Visit (HOSPITAL_BASED_OUTPATIENT_CLINIC_OR_DEPARTMENT_OTHER): Payer: Medicare Other

## 2016-03-11 VITALS — BP 141/55 | HR 84 | Temp 97.5°F | Resp 18

## 2016-03-11 DIAGNOSIS — N189 Chronic kidney disease, unspecified: Principal | ICD-10-CM

## 2016-03-11 DIAGNOSIS — N184 Chronic kidney disease, stage 4 (severe): Secondary | ICD-10-CM

## 2016-03-11 DIAGNOSIS — D631 Anemia in chronic kidney disease: Secondary | ICD-10-CM | POA: Diagnosis not present

## 2016-03-11 LAB — CBC WITH DIFFERENTIAL/PLATELET
BASO%: 1.1 % (ref 0.0–2.0)
Basophils Absolute: 0.1 10*3/uL (ref 0.0–0.1)
EOS%: 4.8 % (ref 0.0–7.0)
Eosinophils Absolute: 0.3 10*3/uL (ref 0.0–0.5)
HCT: 27.2 % — ABNORMAL LOW (ref 34.8–46.6)
HGB: 9.4 g/dL — ABNORMAL LOW (ref 11.6–15.9)
LYMPH%: 22.2 % (ref 14.0–49.7)
MCH: 31.7 pg (ref 25.1–34.0)
MCHC: 34.4 g/dL (ref 31.5–36.0)
MCV: 92.2 fL (ref 79.5–101.0)
MONO#: 0.5 10*3/uL (ref 0.1–0.9)
MONO%: 8.8 % (ref 0.0–14.0)
NEUT%: 63.1 % (ref 38.4–76.8)
NEUTROS ABS: 3.5 10*3/uL (ref 1.5–6.5)
Platelets: 400 10*3/uL (ref 145–400)
RBC: 2.95 10*6/uL — AB (ref 3.70–5.45)
RDW: 14.3 % (ref 11.2–14.5)
WBC: 5.5 10*3/uL (ref 3.9–10.3)
lymph#: 1.2 10*3/uL (ref 0.9–3.3)

## 2016-03-11 MED ORDER — DARBEPOETIN ALFA-POLYSORBATE 500 MCG/ML IJ SOLN: 200.0000 ug | Freq: Once | INTRAMUSCULAR | Status: DC

## 2016-03-11 MED ORDER — DARBEPOETIN ALFA-POLYSORBATE 500 MCG/ML IJ SOLN
200.0000 ug | Freq: Once | INTRAMUSCULAR | Status: DC
  Administered 2016-03-11: 200 ug via SUBCUTANEOUS

## 2016-03-11 MED ORDER — DARBEPOETIN ALFA 200 MCG/0.4ML IJ SOSY
200.0000 ug | PREFILLED_SYRINGE | Freq: Once | INTRAMUSCULAR | Status: AC
  Filled 2016-03-11: qty 0.4

## 2016-03-11 NOTE — Patient Instructions (Signed)
Darbepoetin Alfa injection What is this medicine? DARBEPOETIN ALFA (dar be POE e tin AL fa) helps your body make more red blood cells. It is used to treat anemia caused by chronic kidney failure and chemotherapy. This medicine may be used for other purposes; ask your health care provider or pharmacist if you have questions. What should I tell my health care provider before I take this medicine? They need to know if you have any of these conditions: -blood clotting disorders or history of blood clots -cancer patient not on chemotherapy -cystic fibrosis -heart disease, such as angina, heart failure, or a history of a heart attack -hemoglobin level of 12 g/dL or greater -high blood pressure -low levels of folate, iron, or vitamin B12 -seizures -an unusual or allergic reaction to darbepoetin, erythropoietin, albumin, hamster proteins, latex, other medicines, foods, dyes, or preservatives -pregnant or trying to get pregnant -breast-feeding How should I use this medicine? This medicine is for injection into a vein or under the skin. It is usually given by a health care professional in a hospital or clinic setting. If you get this medicine at home, you will be taught how to prepare and give this medicine. Do not shake the solution before you withdraw a dose. Use exactly as directed. Take your medicine at regular intervals. Do not take your medicine more often than directed. It is important that you put your used needles and syringes in a special sharps container. Do not put them in a trash can. If you do not have a sharps container, call your pharmacist or healthcare provider to get one. Talk to your pediatrician regarding the use of this medicine in children. While this medicine may be used in children as young as 1 year for selected conditions, precautions do apply. Overdosage: If you think you have taken too much of this medicine contact a poison control center or emergency room at once. NOTE:  This medicine is only for you. Do not share this medicine with others. What if I miss a dose? If you miss a dose, take it as soon as you can. If it is almost time for your next dose, take only that dose. Do not take double or extra doses. What may interact with this medicine? Do not take this medicine with any of the following medications: -epoetin alfa This list may not describe all possible interactions. Give your health care provider a list of all the medicines, herbs, non-prescription drugs, or dietary supplements you use. Also tell them if you smoke, drink alcohol, or use illegal drugs. Some items may interact with your medicine. What should I watch for while using this medicine? Visit your prescriber or health care professional for regular checks on your progress and for the needed blood tests and blood pressure measurements. It is especially important for the doctor to make sure your hemoglobin level is in the desired range, to limit the risk of potential side effects and to give you the best benefit. Keep all appointments for any recommended tests. Check your blood pressure as directed. Ask your doctor what your blood pressure should be and when you should contact him or her. As your body makes more red blood cells, you may need to take iron, folic acid, or vitamin B supplements. Ask your doctor or health care provider which products are right for you. If you have kidney disease continue dietary restrictions, even though this medication can make you feel better. Talk with your doctor or health care professional about the   foods you eat and the vitamins that you take. What side effects may I notice from receiving this medicine? Side effects that you should report to your doctor or health care professional as soon as possible: -allergic reactions like skin rash, itching or hives, swelling of the face, lips, or tongue -breathing problems -changes in vision -chest pain -confusion, trouble speaking  or understanding -feeling faint or lightheaded, falls -high blood pressure -muscle aches or pains -pain, swelling, warmth in the leg -rapid weight gain -severe headaches -sudden numbness or weakness of the face, arm or leg -trouble walking, dizziness, loss of balance or coordination -seizures (convulsions) -swelling of the ankles, feet, hands -unusually weak or tired Side effects that usually do not require medical attention (report to your doctor or health care professional if they continue or are bothersome): -diarrhea -fever, chills (flu-like symptoms) -headaches -nausea, vomiting -redness, stinging, or swelling at site where injected This list may not describe all possible side effects. Call your doctor for medical advice about side effects. You may report side effects to FDA at 1-800-FDA-1088. Where should I keep my medicine? Keep out of the reach of children. Store in a refrigerator between 2 and 8 degrees C (36 and 46 degrees F). Do not freeze. Do not shake. Throw away any unused portion if using a single-dose vial. Throw away any unused medicine after the expiration date. NOTE: This sheet is a summary. It may not cover all possible information. If you have questions about this medicine, talk to your doctor, pharmacist, or health care provider.    2016, Elsevier/[employer]. (2007-12-22 10:23:57)  

## 2016-03-20 ENCOUNTER — Ambulatory Visit (HOSPITAL_COMMUNITY)
Admission: RE | Admit: 2016-03-20 | Discharge: 2016-03-20 | Disposition: A | Discharge status: Home / Self Care | Payer: Medicare Other | Source: Ambulatory Visit | Attending: Cardiovascular Disease | Admitting: Cardiovascular Disease

## 2016-03-20 DIAGNOSIS — I6523 Occlusion and stenosis of bilateral carotid arteries: Secondary | ICD-10-CM | POA: Insufficient documentation

## 2016-03-20 DIAGNOSIS — I10 Essential (primary) hypertension: Secondary | ICD-10-CM | POA: Diagnosis not present

## 2016-03-20 DIAGNOSIS — I739 Peripheral vascular disease, unspecified: Secondary | ICD-10-CM | POA: Diagnosis not present

## 2016-03-20 DIAGNOSIS — I2510 Atherosclerotic heart disease of native coronary artery without angina pectoris: Secondary | ICD-10-CM | POA: Diagnosis not present

## 2016-03-20 DIAGNOSIS — E785 Hyperlipidemia, unspecified: Secondary | ICD-10-CM | POA: Diagnosis not present

## 2016-03-21 ENCOUNTER — Ambulatory Visit (INDEPENDENT_AMBULATORY_CARE_PROVIDER_SITE_OTHER): Payer: Medicare Other | Admitting: Internal Medicine

## 2016-03-21 ENCOUNTER — Telehealth: Payer: Self-pay

## 2016-03-21 ENCOUNTER — Encounter: Payer: Self-pay | Admitting: Internal Medicine

## 2016-03-21 DIAGNOSIS — I6523 Occlusion and stenosis of bilateral carotid arteries: Secondary | ICD-10-CM | POA: Diagnosis not present

## 2016-03-21 DIAGNOSIS — B029 Zoster without complications: Secondary | ICD-10-CM | POA: Diagnosis not present

## 2016-03-21 DIAGNOSIS — N184 Chronic kidney disease, stage 4 (severe): Secondary | ICD-10-CM | POA: Diagnosis not present

## 2016-03-21 DIAGNOSIS — N183 Chronic kidney disease, stage 3 (moderate): Secondary | ICD-10-CM | POA: Diagnosis not present

## 2016-03-21 DIAGNOSIS — D631 Anemia in chronic kidney disease: Secondary | ICD-10-CM

## 2016-03-21 DIAGNOSIS — E559 Vitamin D deficiency, unspecified: Secondary | ICD-10-CM

## 2016-03-21 DIAGNOSIS — E039 Hypothyroidism, unspecified: Secondary | ICD-10-CM | POA: Diagnosis not present

## 2016-03-21 DIAGNOSIS — K219 Gastro-esophageal reflux disease without esophagitis: Secondary | ICD-10-CM | POA: Diagnosis not present

## 2016-03-21 DIAGNOSIS — R0989 Other specified symptoms and signs involving the circulatory and respiratory systems: Secondary | ICD-10-CM

## 2016-03-21 DIAGNOSIS — S22080G Wedge compression fracture of T11-T12 vertebra, subsequent encounter for fracture with delayed healing: Secondary | ICD-10-CM | POA: Diagnosis not present

## 2016-03-21 DIAGNOSIS — F4321 Adjustment disorder with depressed mood: Secondary | ICD-10-CM

## 2016-03-21 DIAGNOSIS — I2510 Atherosclerotic heart disease of native coronary artery without angina pectoris: Secondary | ICD-10-CM

## 2016-03-21 MED ORDER — PANTOPRAZOLE SODIUM 40 MG PO TBEC: 40.0000 mg | DELAYED_RELEASE_TABLET | Freq: Every day | ORAL | 2 refills | Status: AC | PRN

## 2016-03-21 NOTE — Telephone Encounter (Signed)
-----   Message from Josue Hector, MD sent at 03/21/2016 10:30 AM EST ----- Left ICA 60-79% stenosis.  No TIA symptoms.  Continue antiplatelet Rx and F/U carotid duplex in 6 months

## 2016-03-21 NOTE — Progress Notes (Signed)
Subjective:  Patient ID: [name] [name], female    DOB: [birth year]  Age: 83 y.o. MRN: [id number]  CC: No chief complaint on file.   HPI CHIMERE KLINGENSMITH presents for zoster f/u. F/u CHF, HTN, CAD. Feeling better. Colchicine is too $$$  Outpatient Medications Prior to Visit  Medication Sig Dispense Refill  . albuterol (PROVENTIL HFA;VENTOLIN HFA) 108 (90 Base) MCG/ACT inhaler Inhale 1-2 puffs into the lungs every 6 (six) hours as needed for wheezing or shortness of breath. 1 Inhaler 1  . allopurinol (ZYLOPRIM) 100 MG tablet Take 1 tablet (100 mg total) by mouth daily. 90 tablet 3  . aspirin EC 81 MG tablet Take 1 tablet (81 mg total) by mouth daily. 90 tablet 3  . atorvastatin (LIPITOR) 40 MG tablet Take 1 tablet (40 mg total) by mouth daily at 6 PM. 90 tablet 1  . b complex vitamins capsule Take 1 capsule by mouth daily.    . Cholecalciferol (EQL VITAMIN D3) 1000 UNITS tablet Take 1 tablet (1,000 Units total) by mouth daily. 100 tablet 3  . darbepoetin (ARANESP) 100 MCG/0.5ML SOLN injection Inject 100 mcg into the skin every 8 (eight) weeks.    [name] escitalopram (LEXAPRO) 5 MG tablet Take 1 tablet (5 mg total) by mouth daily. 30 tablet 5  . furosemide (LASIX) 20 MG tablet Take 1 tablet (20 mg total) by mouth daily as needed for fluid or edema. 30 tablet 0  . levothyroxine (SYNTHROID, LEVOTHROID) 50 MCG tablet TAKE 1 TABLET BY MOUTH EVERY DAY BEFORE BREAKFAST 90 tablet 3  . metoprolol tartrate (LOPRESSOR) 25 MG tablet Take 1 tablet (25 mg total) by mouth 2 (two) times daily. 180 tablet 3  . acetaminophen (TYLENOL) 325 MG tablet Take 2 tablets (650 mg total) by mouth every 6 (six) hours as needed for mild pain (or Fever >/= 101). (Patient not taking: Reported on 03/21/2016) 30 tablet 0  . colchicine 0.6 MG tablet Take 1 tablet (0.6 mg total) by mouth daily. (Patient not taking: Reported on 03/21/2016) 90 tablet 3  . famotidine (PEPCID) 10 MG tablet Take 1 tablet (10 mg total) by mouth at bedtime.  (Patient not taking: Reported on 03/21/2016) 30 tablet 0  . HYDROcodone-acetaminophen (NORCO/VICODIN) 5-325 MG tablet Take 1-2 tablets by mouth every 4 (four) hours as needed for moderate pain. (Patient not taking: Reported on 03/21/2016) 30 tablet 0  . polyethylene glycol (MIRALAX / GLYCOLAX) packet Take 17 g by mouth daily as needed for mild constipation. (Patient not taking: Reported on 03/21/2016) 14 each 0  . predniSONE (DELTASONE) 10 MG tablet 3 tabs by mouth per day for 3 days,2tabs per day for 3 days,1tab per day for 3 days (Patient not taking: Reported on 03/21/2016) 18 tablet 0  . sennosides-docusate sodium (SENOKOT-S) 8.6-50 MG tablet Take 1 tablet by mouth daily.     [name] triamcinolone cream (KENALOG) 0.1 % Apply 1 application topically 3 (three) times daily. (Patient not taking: Reported on 03/21/2016) 30 g 0  . valACYclovir (VALTREX) 1000 MG tablet Take 1 tablet (1,000 mg total) by mouth 3 (three) times daily. (Patient not taking: Reported on 03/21/2016) 21 tablet 0   Facility-Administered Medications Prior to Visit  Medication Dose Route Frequency Provider Last Rate Last Dose  . Darbepoetin Alfa (ARANESP) injection 200 mcg  200 mcg Subcutaneous Once [name], MD      . Darbepoetin Alfa (ARANESP) injection 500 mcg  500 mcg Subcutaneous Once [name], MD  ROS Review of Systems  Constitutional: Positive for fatigue. Negative for activity change, appetite change, chills and unexpected weight change.  HENT: Negative for congestion, mouth sores and sinus pressure.   Eyes: Negative for visual disturbance.  Respiratory: Negative for cough and chest tightness.   Gastrointestinal: Negative for abdominal pain and nausea.  Genitourinary: Negative for difficulty urinating, frequency and vaginal pain.  Musculoskeletal: Positive for arthralgias and back pain. Negative for gait problem.  Skin: Negative for pallor and rash.  Neurological: Negative for dizziness, tremors, weakness, numbness and  headaches.  Psychiatric/Behavioral: Negative for confusion and sleep disturbance.    Objective:  BP (!) 124/58 (BP Location: Left Arm, Patient Position: Sitting, Cuff Size: Normal)   Pulse 68   Temp 98.2 F (36.8 C) (Oral)   Ht 5\' 3"  (1.6 m)   Wt 165 lb 1.3 oz (74.9 kg)   SpO2 97%   BMI 29.24 kg/m   BP Readings from Last 3 Encounters:  03/21/16 (!) 124/58  03/11/16 (!) 141/55  02/15/16 130/70    Wt Readings from Last 3 Encounters:  03/21/16 165 lb 1.3 oz (74.9 kg)  02/15/16 163 lb (73.9 kg)  02/05/16 162 lb (73.5 kg)    Physical Exam  Constitutional: She appears well-developed. No distress.  HENT:  Head: Normocephalic.  Right Ear: External ear normal.  Left Ear: External ear normal.  Nose: Nose normal.  Mouth/Throat: Oropharynx is clear and moist.  Eyes: Conjunctivae are normal. Pupils are equal, round, and reactive to light. Right eye exhibits no discharge. Left eye exhibits no discharge.  Neck: Normal range of motion. Neck supple. No JVD present. No tracheal deviation present. No thyromegaly present.  Cardiovascular: Normal rate, regular rhythm and normal heart sounds.   Pulmonary/Chest: No stridor. No respiratory distress. She has no wheezes.  Abdominal: Soft. Bowel sounds are normal. She exhibits no distension and no mass. There is no tenderness. There is no rebound and no guarding.  Musculoskeletal: She exhibits tenderness. She exhibits no edema.  Lymphadenopathy:    She has no cervical adenopathy.  Neurological: She displays normal reflexes. No cranial nerve deficit. She exhibits normal muscle tone. Coordination abnormal.  Skin: No rash noted. No erythema.  Psychiatric: She has a normal mood and affect. Her behavior is normal. Judgment and thought content normal.  walker  Trace edema  lat foot is tender  Lab Results  Component Value Date   WBC 5.5 03/11/2016   HGB 9.4 (L) 03/11/2016   HCT 27.2 (L) 03/11/2016   PLT 400 03/11/2016   GLUCOSE 89 01/11/2016    CHOL 154 06/07/2015   TRIG 96.0 06/07/2015   HDL 47.60 06/07/2015   LDLCALC 87 06/07/2015   ALT 12 (L) 01/11/2016   AST 22 01/11/2016   NA 138 01/18/2016   K 4.6 01/18/2016   CL 102 01/11/2016   CREATININE 1.8 (A) 01/18/2016   BUN 31 (A) 01/18/2016   CO2 27 01/11/2016   TSH 0.60 06/07/2015   INR 0.98 05/03/2014    No results found.  Assessment & Plan:   There are no diagnoses linked to this encounter. I am having Ms. Hubers maintain her sennosides-docusate sodium, b complex vitamins, darbepoetin, Cholecalciferol, albuterol, levothyroxine, metoprolol tartrate, aspirin EC, escitalopram, famotidine, furosemide, acetaminophen, polyethylene glycol, HYDROcodone-acetaminophen, valACYclovir, triamcinolone cream, predniSONE, colchicine, allopurinol, atorvastatin, Biotin, and pantoprazole.  Meds ordered this encounter  Medications  . Biotin 5000 MCG CAPS    Sig: Take by mouth 1 day or 1 dose.  . pantoprazole (PROTONIX) 40 MG  tablet    Sig: Take 40 mg by mouth daily.     Follow-up: No Follow-up on file.  [name], MD

## 2016-03-21 NOTE — Assessment & Plan Note (Signed)
Aranesp 

## 2016-03-21 NOTE — Telephone Encounter (Signed)
Order put in for carotid ultra sound in 6 months. Will put in recall for patient to receive a letter in 4 months to schedule.

## 2016-03-21 NOTE — Assessment & Plan Note (Signed)
Lexapro 

## 2016-03-21 NOTE — Assessment & Plan Note (Signed)
Monitor BMET. 

## 2016-03-21 NOTE — Assessment & Plan Note (Signed)
Protonix.  ?

## 2016-03-21 NOTE — Assessment & Plan Note (Signed)
Recovered  

## 2016-03-21 NOTE — Progress Notes (Signed)
Pre visit review using our clinic review tool, if applicable. No additional management support is needed unless otherwise documented below in the visit note. 

## 2016-03-21 NOTE — Assessment & Plan Note (Signed)
Doing fair 

## 2016-03-21 NOTE — Assessment & Plan Note (Signed)
On Aranesp 

## 2016-03-21 NOTE — Assessment & Plan Note (Signed)
On Levothroid 

## 2016-03-21 NOTE — Assessment & Plan Note (Signed)
On Vit D 

## 2016-04-22 ENCOUNTER — Other Ambulatory Visit: Payer: Self-pay | Admitting: Internal Medicine

## 2016-04-22 DIAGNOSIS — Z1231 Encounter for screening mammogram for malignant neoplasm of breast: Secondary | ICD-10-CM

## 2016-05-09 NOTE — Progress Notes (Signed)
[facility] PROGRESS NOTE DATE OF VISIT: 05/13/2016   [name], MD [place] [address]  DIAGNOSIS: Anemia in stage 4 chronic kidney disease ([place])  CHIEF COMPLAIN: f/u anemia   CURRENT THERAPY: Aranesp 200 mcg subcutaneous every 2 months for hemoglobin less than 11.  INTERVAL HISTORY: [name] [name] 83 y.o. female with a history of anemia secondary to renal insufficiency is here for follow-up. She reports she is doing well. The patient reports since her last follow up she had shingles on the right side of her face and at hairline. She also reports she fractured her pelvis in September 2017 when she fell in her yard. The fracture was not discovered until December 2017. She was subsequently hospitalized from 01/07/16 to 01/11/16.  The patient reports severe back pain in the lower part of her back. She uses a heating pad for some relief from her discomfort. She is not taking any medications for osteoporosis at this time.   She denies edema to her bilateral legs at this time. The patient reports she is still taking Lasix, which her PCP, Dr. Alain Marion, prescribed for edema.      MEDICAL HISTORY: Past Medical History:  Diagnosis Date  . Anemia, unspecified   . Anxiety state, unspecified   . Depressive disorder, not elsewhere classified   . Diverticulosis of colon (without mention of hemorrhage)   . Diverticulosis of colon with hemorrhage   . Esophageal reflux   . Lumbago   . Myocardial infarction ([place])   . Other and unspecified hyperlipidemia   . Persistent disorder of initiating or maintaining sleep   . Thyroid disease   . Unspecified asthma(493.90)   . Unspecified disorder resulting from impaired renal function   . Unspecified essential hypertension    Dr [name]  . Unspecified osteomyelitis, site unspecified   . Unspecified vitamin D deficiency    INTERIM HISTORY: has Vitamin D deficiency; Dyslipidemia; DEHYDRATION (VOLUME  DEPLETION); Anxiety state; INSOMNIA, PERSISTENT; Essential hypertension; Coronary atherosclerosis; Bilateral carotid bruits; PERIPHERAL VASCULAR DISEASE; Asthma; GERD; DIVERTICULOSIS, COLON; Diverticulosis of colon with hemorrhage; PANCREATITIS; Disorder resulting from impaired renal function; PYELONEPHRITIS; UNSPECIFIED DISORDER OF URETHRA&URINARY TRACT; LOW BACK PAIN; OSTEOMYELITIS; SYNCOPE; Fatigue; Anemia in chronic renal disease; Constipation; Hypothyroidism; A-fib ([place]); CHF (congestive heart failure) ([place]); PNA (pneumonia); Thyroid nodule; Influenza due to influenza A virus; Minimal cognitive impairment; Neuropathy; Disuse syndrome; Restless leg; Left leg weakness; Fall against object; Left ankle pain; Primary localized osteoarthrosis, lower leg; Alopecia; Pes anserine bursitis; Situational depression; Decreased ambulation status; Left-sided low back pain with sciatica; CKD (chronic kidney disease) stage 4, GFR 15-29 ml/min ([place]); Back pain; Compression fracture of T12 vertebra with delayed healing; Poor tolerance for ambulation; Osteoporosis; Piriformis syndrome of left side; Anemia; Acute gout; Neck pain, musculoskeletal; Pubic ramus fracture ([place]); Herpes zoster; and Pelvis fracture, right, sequela on her problem list.    ALLERGIES:  is allergic to codeine sulfate; darvon [propoxyphene]; propoxyphene napsylate; sulfacetamide sodium-sulfur; sulfamethoxazole-trimethoprim; and zolpidem.  MEDICATIONS: has a current medication list which includes the following prescription(s): acetaminophen, albuterol, aspirin, atorvastatin, b complex vitamins, biotin, cholecalciferol, darbepoetin, escitalopram, furosemide, levothyroxine, metoprolol tartrate, pantoprazole, sennosides-docusate sodium, cyclobenzaprine, and polyethylene glycol powder, and the following Facility-Administered Medications: darbepoetin alfa and darbepoetin alfa.  SURGICAL HISTORY:  Past Surgical History:  Procedure Laterality Date  . BONE  MARROW BIOPSY    . CORONARY ARTERY BYPASS GRAFT  03/25/13   x4. CABS ON PUMP-Surgeon Jomarie Longs.  [name] [place]  diverticulosis of colon iwth hemorrhage.   . status post right carotid enterectomy      REVIEW OF SYSTEMS:  Constitutional: Denies fevers, chills (+) mild fatigue Eyes: Denies blurriness of vision Ears, nose, mouth, throat, and face: Denies mucositis or sore throat Respiratory: Denies cough, dyspnea or wheezes Cardiovascular: Denies palpitation, chest discomfort or lower extremity swelling Gastrointestinal:  Denies nausea, heartburn or change in bowel habits Skin: Denies abnormal skin rashes Lymphatics: Denies new lymphadenopathy or easy bruising Musculoskeletal: (+) severe lower back pain Neurological: Denies numbness, tingling or new weaknesses Behavioral/Psych: Mood is stable, no new changes  All other systems were reviewed with the patient and are negative.  PHYSICAL EXAMINATION: ECOG PERFORMANCE STATUS: 1  Blood pressure 134/60, pulse 72, temperature 98.6 F (37 C), temperature source Oral, resp. rate 18, height 5\' 3"  (1.6 m), weight 163 lb 9.6 oz (74.2 kg), SpO2 100 %.   GENERAL:alert, no distress and comfortable; elderly female who is well developed, well nourished.  SKIN: skin color, texture, turgor are normal, no rashes or significant lesions; scar on chest well healed.  EYES: normal, Conjunctiva are pink and non-injected, sclera clear OROPHARYNX:no exudate, no erythema and lips, buccal mucosa, and tongue normal  NECK: supple, thyroid normal size, non-tender, without nodularity LYMPH:  no palpable lymphadenopathy in the cervical, axillary or supraclavicular LUNGS: clear to auscultation and percussion with normal breathing effort HEART: regular rate & rhythm and no murmurs and no lower extremity edema ABDOMEN:abdomen soft, non-tender and normal bowel sounds Musculoskeletal:no cyanosis of digits and no clubbing  NEURO: alert & oriented x 3 with fluent  speech, no focal motor/sensory deficits  LABORATORY DATA: CBC Latest Ref Rng & Units 05/13/2016 03/11/2016 01/18/2016  WBC 3.9 - 10.3 10e3/uL 6.4 5.5 6.0  Hemoglobin 11.6 - 15.9 g/dL 10.8(L) 9.4(L) 9.8(A)  Hematocrit 34.8 - 46.6 % 32.1(L) 27.2(L) 30(A)  Platelets 145 - 400 10e3/uL 330 400 477(A)    CMP Latest Ref Rng & Units 05/13/2016 01/18/2016 01/11/2016  Glucose 70 - 140 mg/dl 112 - 89  BUN 7.0 - 26.0 mg/dL 33.2(H) 31(A) 32(H)  Creatinine 0.6 - 1.1 mg/dL 1.7(H) 1.8(A) 1.75(H)  Sodium 136 - 145 mEq/L 139 138 136  Potassium 3.5 - 5.1 mEq/L 3.7 4.6 4.1  Chloride 101 - 111 mmol/L - - 102  CO2 22 - 29 mEq/L 26 - 27  Calcium 8.4 - 10.4 mg/dL 9.4 - 9.0  Total Protein 6.4 - 8.3 g/dL 6.8 - 6.5  Total Bilirubin 0.20 - 1.20 mg/dL 0.46 - 0.8  Alkaline Phos 40 - 150 U/L 104 - 67  AST 5 - 34 U/L 26 - 22  ALT 0 - 55 U/L 14 - 12(L)    Results for YARETSI, HUMPHRES (MRN [id number]) as of 12/18/2015 13:08  Ref. Range 12/29/2014 10:36 06/07/2015 12:39  Iron Latest Ref Range: 42 - 145 ug/dL 77 75  UIBC Latest Ref Range: 120 - 384 ug/dL 180   TIBC Latest Ref Range: 236 - 444 ug/dL 258   %SAT Latest Ref Range: 21 - 57 % 30   Saturation Ratios Latest Ref Range: 20.0 - 50.0 %  23.0  Ferritin Latest Ref Range: 9 - 269 ng/ml 159   Transferrin Latest Ref Range: 212.0 - 360.0 mg/dL  233.0   Studies:  No results found.   RADIOGRAPHIC STUDY No new study   CT Lumbar Spine WO Contrast 01/07/16 IMPRESSION: No acute fractures seen today. There is a subacute, healing fracture at T11 with loss of height of only  10% or less. This was acute in September. Old augmented fracture at T12. Old un augmented fractures at the other lumbar levels as outlined above.  CT Right Hip WO Contrast 01/07/16 IMPRESSION: No evidence of femoral neck fracture. Questionable nondisplaced right superior pubic ramus fracture, only seen on coronal imaging. Early degenerative changes in the right hip.   ASSESSMENT: [name] [name]  83 y.o. female with a history of Anemia in stage 4 chronic kidney disease ([place])   PLAN:  1. Anemia in chronic renal disease.   -She is clinically doing well, her hemoglobin today is 10.8 today, 05/13/16. She was able to maintain her hemoglobin well with Aranesp every 3 months. However she previously complained about fatigue, and would like to go back to Aranesp injection every 2 months.  - The patient missed one cycle of Aranesp in December 2017 due to hospitalization. -We again reviewed the benefit and the risks of Aranesp injection, especially the risk of thrombosis, including heart attack and stroke. She voiced good understanding.  -Her iron study 05/2015 was completely normal. - Labs reviewed, Hb 10.8 today. Proceed with Aranesp today. -continue lab and injection every 2 months   2. Chronic kidney disease.  --Avoid nephrotoxins and counseled on continued adequate hydration.   - I advised the patient that she could probably discontinue Lasix if she is no longer experiencing lower extremity edema. This could be contributing to her decreased kidney function. I will consult with Dr. Alain Marion on this, and encouraged the patient to discuss this with him as well when she sees him next.  3. Gout  -I previously encouraged her to follow-up with her primary care physician, and discussed if she would benefit from allopurinol to prevent gout flare  4. CAD, CHF, HTN, ARTHRITIS  -She will continue follow-up with her primary care physician  5. Hypertension -BP today is 134/60 -She will follow with primary care -Encouraged the patient to check blood pressure regularly at home  6. Osteoporosis - We reviewed the patient's history of hip fracture and many compression fractures in the lumbar spine. - The patient is not currently on any medication for osteoporosis, and reports she has never taken anything for this. - I encouraged the patient to discuss this with her PCP and consider Prolia    Follow-up.    - Labs reviewed. Aranesp injection today and every 2 months with labs, If Hb<11.0 - Follow up in 6 months. - When the patient sees Dr. Alain Marion next, she will discuss her osteoporosis, kidney function, and Lasix prescription with him. I will copy Dr. Alain Marion on my note today.  Patient had many questions, especially about her gout today. All questions were answered. The patient knows to call the clinic with any problems, questions or concerns. We can certainly see the patient much sooner if necessary.  I spent 20 minutes counseling the patient face to face. The total time spent in the appointment was 25 minutes.  This document serves as a record of services personally performed by [name], MD. It was created on her behalf by Maryla Morrow, a trained medical scribe. The creation of this record is based on the scribe's personal observations and the provider's statements to them. This document has been checked and approved by the attending provider.   [name]  05/13/2016

## 2016-05-10 ENCOUNTER — Other Ambulatory Visit: Payer: Self-pay | Admitting: *Deleted

## 2016-05-10 DIAGNOSIS — N184 Chronic kidney disease, stage 4 (severe): Secondary | ICD-10-CM

## 2016-05-10 DIAGNOSIS — D631 Anemia in chronic kidney disease: Secondary | ICD-10-CM

## 2016-05-13 ENCOUNTER — Ambulatory Visit (HOSPITAL_BASED_OUTPATIENT_CLINIC_OR_DEPARTMENT_OTHER): Payer: Medicare Other | Admitting: Hematology

## 2016-05-13 ENCOUNTER — Telehealth: Payer: Self-pay | Admitting: Hematology

## 2016-05-13 ENCOUNTER — Encounter: Payer: Self-pay | Admitting: Hematology

## 2016-05-13 ENCOUNTER — Other Ambulatory Visit (HOSPITAL_BASED_OUTPATIENT_CLINIC_OR_DEPARTMENT_OTHER): Payer: Medicare Other

## 2016-05-13 ENCOUNTER — Ambulatory Visit (HOSPITAL_BASED_OUTPATIENT_CLINIC_OR_DEPARTMENT_OTHER): Payer: Medicare Other

## 2016-05-13 VITALS — BP 134/60 | HR 72 | Temp 98.6°F | Resp 18 | Ht 63.0 in | Wt 163.6 lb

## 2016-05-13 DIAGNOSIS — D631 Anemia in chronic kidney disease: Secondary | ICD-10-CM

## 2016-05-13 DIAGNOSIS — M810 Age-related osteoporosis without current pathological fracture: Secondary | ICD-10-CM | POA: Diagnosis not present

## 2016-05-13 DIAGNOSIS — M545 Low back pain: Secondary | ICD-10-CM

## 2016-05-13 DIAGNOSIS — N184 Chronic kidney disease, stage 4 (severe): Secondary | ICD-10-CM

## 2016-05-13 DIAGNOSIS — N189 Chronic kidney disease, unspecified: Principal | ICD-10-CM

## 2016-05-13 DIAGNOSIS — I10 Essential (primary) hypertension: Secondary | ICD-10-CM

## 2016-05-13 DIAGNOSIS — M109 Gout, unspecified: Secondary | ICD-10-CM | POA: Diagnosis not present

## 2016-05-13 LAB — CBC WITH DIFFERENTIAL/PLATELET
BASO%: 0.9 % (ref 0.0–2.0)
Basophils Absolute: 0.1 10*3/uL (ref 0.0–0.1)
EOS%: 3.1 % (ref 0.0–7.0)
Eosinophils Absolute: 0.2 10*3/uL (ref 0.0–0.5)
HCT: 32.1 % — ABNORMAL LOW (ref 34.8–46.6)
HGB: 10.8 g/dL — ABNORMAL LOW (ref 11.6–15.9)
LYMPH%: 28.7 % (ref 14.0–49.7)
MCH: 30.7 pg (ref 25.1–34.0)
MCHC: 33.6 g/dL (ref 31.5–36.0)
MCV: 91.2 fL (ref 79.5–101.0)
MONO#: 0.5 10*3/uL (ref 0.1–0.9)
MONO%: 8.3 % (ref 0.0–14.0)
NEUT#: 3.8 10*3/uL (ref 1.5–6.5)
NEUT%: 59 % (ref 38.4–76.8)
Platelets: 330 10*3/uL (ref 145–400)
RBC: 3.52 10*6/uL — ABNORMAL LOW (ref 3.70–5.45)
RDW: 13.5 % (ref 11.2–14.5)
WBC: 6.4 10*3/uL (ref 3.9–10.3)
lymph#: 1.8 10*3/uL (ref 0.9–3.3)

## 2016-05-13 LAB — COMPREHENSIVE METABOLIC PANEL WITH GFR
ALT: 14 U/L (ref 0–55)
AST: 26 U/L (ref 5–34)
Albumin: 3.5 g/dL (ref 3.5–5.0)
Alkaline Phosphatase: 104 U/L (ref 40–150)
Anion Gap: 11 meq/L (ref 3–11)
BUN: 33.2 mg/dL — ABNORMAL HIGH (ref 7.0–26.0)
CO2: 26 meq/L (ref 22–29)
Calcium: 9.4 mg/dL (ref 8.4–10.4)
Chloride: 103 meq/L (ref 98–109)
Creatinine: 1.7 mg/dL — ABNORMAL HIGH (ref 0.6–1.1)
EGFR: 27 mL/min/{1.73_m2} — ABNORMAL LOW
Glucose: 112 mg/dL (ref 70–140)
Potassium: 3.7 meq/L (ref 3.5–5.1)
Sodium: 139 meq/L (ref 136–145)
Total Bilirubin: 0.46 mg/dL (ref 0.20–1.20)
Total Protein: 6.8 g/dL (ref 6.4–8.3)

## 2016-05-13 MED ORDER — DARBEPOETIN ALFA-POLYSORBATE 500 MCG/ML IJ SOLN
200.0000 ug | Freq: Once | INTRAMUSCULAR | Status: DC
  Administered 2016-05-13: 200 ug via SUBCUTANEOUS

## 2016-05-13 MED ORDER — DARBEPOETIN ALFA 200 MCG/0.4ML IJ SOSY
200.0000 ug | PREFILLED_SYRINGE | Freq: Once | INTRAMUSCULAR | Status: AC
  Administered 2016-05-13: 200 ug via SUBCUTANEOUS
  Filled 2016-05-13: qty 0.4

## 2016-05-13 MED ORDER — DARBEPOETIN ALFA-POLYSORBATE 500 MCG/ML IJ SOLN: 200.0000 ug | Freq: Once | INTRAMUSCULAR | Status: DC

## 2016-05-13 NOTE — Patient Instructions (Signed)
Darbepoetin Alfa injection What is this medicine? DARBEPOETIN ALFA (dar be POE e tin AL fa) helps your body make more red blood cells. It is used to treat anemia caused by chronic kidney failure and chemotherapy. This medicine may be used for other purposes; ask your health care provider or pharmacist if you have questions. What should I tell my health care provider before I take this medicine? They need to know if you have any of these conditions: -blood clotting disorders or history of blood clots -cancer patient not on chemotherapy -cystic fibrosis -heart disease, such as angina, heart failure, or a history of a heart attack -hemoglobin level of 12 g/dL or greater -high blood pressure -low levels of folate, iron, or vitamin B12 -seizures -an unusual or allergic reaction to darbepoetin, erythropoietin, albumin, hamster proteins, latex, other medicines, foods, dyes, or preservatives -pregnant or trying to get pregnant -breast-feeding How should I use this medicine? This medicine is for injection into a vein or under the skin. It is usually given by a health care professional in a hospital or clinic setting. If you get this medicine at home, you will be taught how to prepare and give this medicine. Do not shake the solution before you withdraw a dose. Use exactly as directed. Take your medicine at regular intervals. Do not take your medicine more often than directed. It is important that you put your used needles and syringes in a special sharps container. Do not put them in a trash can. If you do not have a sharps container, call your pharmacist or healthcare provider to get one. Talk to your pediatrician regarding the use of this medicine in children. While this medicine may be used in children as young as 1 year for selected conditions, precautions do apply. Overdosage: If you think you have taken too much of this medicine contact a poison control center or emergency room at once. NOTE:  This medicine is only for you. Do not share this medicine with others. What if I miss a dose? If you miss a dose, take it as soon as you can. If it is almost time for your next dose, take only that dose. Do not take double or extra doses. What may interact with this medicine? Do not take this medicine with any of the following medications: -epoetin alfa This list may not describe all possible interactions. Give your health care provider a list of all the medicines, herbs, non-prescription drugs, or dietary supplements you use. Also tell them if you smoke, drink alcohol, or use illegal drugs. Some items may interact with your medicine. What should I watch for while using this medicine? Visit your prescriber or health care professional for regular checks on your progress and for the needed blood tests and blood pressure measurements. It is especially important for the doctor to make sure your hemoglobin level is in the desired range, to limit the risk of potential side effects and to give you the best benefit. Keep all appointments for any recommended tests. Check your blood pressure as directed. Ask your doctor what your blood pressure should be and when you should contact him or her. As your body makes more red blood cells, you may need to take iron, folic acid, or vitamin B supplements. Ask your doctor or health care provider which products are right for you. If you have kidney disease continue dietary restrictions, even though this medication can make you feel better. Talk with your doctor or health care professional about the   foods you eat and the vitamins that you take. What side effects may I notice from receiving this medicine? Side effects that you should report to your doctor or health care professional as soon as possible: -allergic reactions like skin rash, itching or hives, swelling of the face, lips, or tongue -breathing problems -changes in vision -chest pain -confusion, trouble speaking  or understanding -feeling faint or lightheaded, falls -high blood pressure -muscle aches or pains -pain, swelling, warmth in the leg -rapid weight gain -severe headaches -sudden numbness or weakness of the face, arm or leg -trouble walking, dizziness, loss of balance or coordination -seizures (convulsions) -swelling of the ankles, feet, hands -unusually weak or tired Side effects that usually do not require medical attention (report to your doctor or health care professional if they continue or are bothersome): -diarrhea -fever, chills (flu-like symptoms) -headaches -nausea, vomiting -redness, stinging, or swelling at site where injected This list may not describe all possible side effects. Call your doctor for medical advice about side effects. You may report side effects to FDA at 1-800-FDA-1088. Where should I keep my medicine? Keep out of the reach of children. Store in a refrigerator between 2 and 8 degrees C (36 and 46 degrees F). Do not freeze. Do not shake. Throw away any unused portion if using a single-dose vial. Throw away any unused medicine after the expiration date. NOTE: This sheet is a summary. It may not cover all possible information. If you have questions about this medicine, talk to your doctor, pharmacist, or health care provider.    2016, Elsevier/[employer]. (2007-12-22 10:23:57)  

## 2016-05-13 NOTE — Telephone Encounter (Signed)
Gave patient AVS and calender per 4/23 los.  

## 2016-05-14 ENCOUNTER — Telehealth: Payer: Self-pay | Admitting: *Deleted

## 2016-05-14 NOTE — Telephone Encounter (Signed)
Received call from pt asking to speak to Dr Burr Medico whom she saw yesterday but has some questions in regard to her diagnosis of anemia in chronic disease.  She states that she was wrong with her information of next visit with Dr Christen Bame.  It is 06/21/16 & she wants to know if she should be seen sooner.  Information given about her disease & she expressed that this was very helpful but would still like to talk with Dr Burr Medico if she is able to call her. Pt may be reached at home #.  Message to Dr Burr Medico.

## 2016-05-15 NOTE — Telephone Encounter (Signed)
I have called her back and answered her questions. Her will discuss her CKD and osteoporosis with her PCP on her next visit.   [name] MD

## 2016-05-27 ENCOUNTER — Telehealth: Payer: Self-pay | Admitting: Internal Medicine

## 2016-05-27 NOTE — Telephone Encounter (Signed)
Pt called and states she is constantly sneezing and blowing her nose. Would like something called in or would like something suggested OTC.  Pt does not want to come in.  [facility] on [address]

## 2016-05-27 NOTE — Telephone Encounter (Signed)
Pt.notified

## 2016-05-27 NOTE — Telephone Encounter (Signed)
Please advise 

## 2016-05-27 NOTE — Telephone Encounter (Signed)
Loratidine 10 mg a day (OTC) Thx

## 2016-06-03 ENCOUNTER — Ambulatory Visit
Admission: RE | Admit: 2016-06-03 | Discharge: 2016-06-03 | Disposition: A | Discharge status: Home / Self Care | Payer: Medicare Other | Source: Ambulatory Visit | Attending: Internal Medicine | Admitting: Internal Medicine

## 2016-06-03 DIAGNOSIS — Z1231 Encounter for screening mammogram for malignant neoplasm of breast: Secondary | ICD-10-CM

## 2016-06-21 ENCOUNTER — Ambulatory Visit (INDEPENDENT_AMBULATORY_CARE_PROVIDER_SITE_OTHER): Payer: Medicare Other | Admitting: Internal Medicine

## 2016-06-21 VITALS — BP 160/82 | HR 67 | Temp 98.2°F | Ht 63.0 in | Wt 160.1 lb

## 2016-06-21 DIAGNOSIS — I5020 Unspecified systolic (congestive) heart failure: Secondary | ICD-10-CM | POA: Diagnosis not present

## 2016-06-21 DIAGNOSIS — N184 Chronic kidney disease, stage 4 (severe): Secondary | ICD-10-CM | POA: Diagnosis not present

## 2016-06-21 DIAGNOSIS — E559 Vitamin D deficiency, unspecified: Secondary | ICD-10-CM | POA: Diagnosis not present

## 2016-06-21 DIAGNOSIS — M8000XD Age-related osteoporosis with current pathological fracture, unspecified site, subsequent encounter for fracture with routine healing: Secondary | ICD-10-CM

## 2016-06-21 DIAGNOSIS — I10 Essential (primary) hypertension: Secondary | ICD-10-CM | POA: Diagnosis not present

## 2016-06-21 DIAGNOSIS — E785 Hyperlipidemia, unspecified: Secondary | ICD-10-CM | POA: Diagnosis not present

## 2016-06-21 DIAGNOSIS — I6523 Occlusion and stenosis of bilateral carotid arteries: Secondary | ICD-10-CM

## 2016-06-21 DIAGNOSIS — E039 Hypothyroidism, unspecified: Secondary | ICD-10-CM

## 2016-06-21 DIAGNOSIS — I480 Paroxysmal atrial fibrillation: Secondary | ICD-10-CM | POA: Diagnosis not present

## 2016-06-21 MED ORDER — ZOSTER VAC RECOMB ADJUVANTED 50 MCG/0.5ML IM SUSR: 0.5000 mL | Freq: Once | INTRAMUSCULAR | 1 refills | Status: AC

## 2016-06-21 NOTE — Patient Instructions (Signed)
MC well w/[name] [name] 

## 2016-06-21 NOTE — Assessment & Plan Note (Addendum)
Will try to get by with Lasix 1/2 tab qam. Re-start bid if wt gain

## 2016-06-21 NOTE — Assessment & Plan Note (Signed)
On Levothroid 

## 2016-06-21 NOTE — Assessment & Plan Note (Signed)
Chronic w/vertebral fx Options discussed Prolia advised - pt declined Vit D

## 2016-06-21 NOTE — Assessment & Plan Note (Addendum)
BP Readings from Last 3 Encounters:  06/21/16 (!) 160/82  05/13/16 134/60  03/21/16 (!) 124/58  NAS diet Lopressor, Furosemide

## 2016-06-21 NOTE — Assessment & Plan Note (Signed)
Furosemide - taking 20 mg 1/2 bid Will try to get by with 1/2 tab qam due to CRF. Re-start bid if wt gain

## 2016-06-21 NOTE — Progress Notes (Signed)
Subjective:  Patient ID: [name] [name], female    DOB: March 17, 1931  Age: 83 y.o. MRN: [id number]  CC: No chief complaint on file.   HPI [name] presents for CRI, HTN, CAD, CHF and osteoporosis f/u   Outpatient Medications Prior to Visit  Medication Sig Dispense Refill  . acetaminophen (TYLENOL) 325 MG tablet Take 2 tablets (650 mg total) by mouth every 6 (six) hours as needed for mild pain (or Fever >/= 101). 30 tablet 0  . albuterol (PROVENTIL HFA;VENTOLIN HFA) 108 (90 Base) MCG/ACT inhaler Inhale 1-2 puffs into the lungs every 6 (six) hours as needed for wheezing or shortness of breath. 1 Inhaler 1  . allopurinol (ZYLOPRIM) 100 MG tablet Take 1 tablet (100 mg total) by mouth daily. 90 tablet 3  . aspirin EC 81 MG tablet Take 1 tablet (81 mg total) by mouth daily. 90 tablet 3  . atorvastatin (LIPITOR) 40 MG tablet Take 1 tablet (40 mg total) by mouth daily at 6 PM. 90 tablet 1  . b complex vitamins capsule Take 1 capsule by mouth daily.    . Biotin 5000 MCG CAPS Take by mouth 1 day or 1 dose.    . Cholecalciferol (EQL VITAMIN D3) 1000 UNITS tablet Take 1 tablet (1,000 Units total) by mouth daily. 100 tablet 3  . darbepoetin (ARANESP) 100 MCG/0.5ML SOLN injection Inject 100 mcg into the skin every 8 (eight) weeks.    [name] escitalopram (LEXAPRO) 5 MG tablet Take 1 tablet (5 mg total) by mouth daily. 30 tablet 5  . furosemide (LASIX) 20 MG tablet Take 1 tablet (20 mg total) by mouth daily as needed for fluid or edema. 30 tablet 0  . levothyroxine (SYNTHROID, LEVOTHROID) 50 MCG tablet TAKE 1 TABLET BY MOUTH EVERY DAY BEFORE BREAKFAST 90 tablet 3  . metoprolol tartrate (LOPRESSOR) 25 MG tablet Take 1 tablet (25 mg total) by mouth 2 (two) times daily. 180 tablet 3  . pantoprazole (PROTONIX) 40 MG tablet Take 1 tablet (40 mg total) by mouth daily as needed. (Patient not taking: Reported on 05/13/2016) 90 tablet 2  . triamcinolone cream (KENALOG) 0.1 % Apply 1 application topically 3 (three)  times daily. (Patient not taking: Reported on 06/21/2016) 30 g 0   Facility-Administered Medications Prior to Visit  Medication Dose Route Frequency Provider Last Rate Last Dose  . Darbepoetin Alfa (ARANESP) injection 200 mcg  200 mcg Subcutaneous Once [name], MD      . Darbepoetin Alfa Kyra Searles) injection 500 mcg  500 mcg Subcutaneous Once [name], MD        ROS Review of Systems  Constitutional: Positive for fatigue. Negative for activity change, appetite change, chills and unexpected weight change.  HENT: Negative for congestion, mouth sores and sinus pressure.   Eyes: Negative for visual disturbance.  Respiratory: Negative for cough and chest tightness.   Gastrointestinal: Negative for abdominal pain and nausea.  Genitourinary: Negative for difficulty urinating, frequency and vaginal pain.  Musculoskeletal: Negative for back pain and gait problem.  Skin: Negative for pallor and rash.  Neurological: Negative for dizziness, tremors, weakness, numbness and headaches.  Psychiatric/Behavioral: Negative for confusion and sleep disturbance. The patient is nervous/anxious.     Objective:  BP (!) 160/82 (BP Location: Left Arm, Patient Position: Sitting, Cuff Size: Normal)   Pulse 67   Temp 98.2 F (36.8 C) (Oral)   Ht 5\' 3"  (1.6 m)   Wt 160 lb 1.3 oz (72.6 kg)   SpO2 98%  BMI 28.36 kg/m   BP Readings from Last 3 Encounters:  06/21/16 (!) 160/82  05/13/16 134/60  03/21/16 (!) 124/58    Wt Readings from Last 3 Encounters:  06/21/16 160 lb 1.3 oz (72.6 kg)  05/13/16 163 lb 9.6 oz (74.2 kg)  03/21/16 165 lb 1.3 oz (74.9 kg)    Physical Exam  Constitutional: She appears well-developed. No distress.  HENT:  Head: Normocephalic.  Right Ear: External ear normal.  Left Ear: External ear normal.  Nose: Nose normal.  Mouth/Throat: Oropharynx is clear and moist.  Eyes: Conjunctivae are normal. Pupils are equal, round, and reactive to light. Right eye exhibits no discharge.  Left eye exhibits no discharge.  Neck: Normal range of motion. Neck supple. No JVD present. No tracheal deviation present. No thyromegaly present.  Cardiovascular: Normal rate, regular rhythm and normal heart sounds.   Pulmonary/Chest: No stridor. No respiratory distress. She has no wheezes.  Abdominal: Soft. Bowel sounds are normal. She exhibits no distension and no mass. There is no tenderness. There is no rebound and no guarding.  Musculoskeletal: She exhibits edema. She exhibits no tenderness.  Lymphadenopathy:    She has no cervical adenopathy.  Neurological: She displays normal reflexes. No cranial nerve deficit. She exhibits normal muscle tone. Coordination abnormal.  Skin: No rash noted. No erythema.  Psychiatric: She has a normal mood and affect. Her behavior is normal. Judgment and thought content normal.  trace edema B ankles walker  Lab Results  Component Value Date   WBC 6.4 05/13/2016   HGB 10.8 (L) 05/13/2016   HCT 32.1 (L) 05/13/2016   PLT 330 05/13/2016   GLUCOSE 112 05/13/2016   CHOL 154 06/07/2015   TRIG 96.0 06/07/2015   HDL 47.60 06/07/2015   LDLCALC 87 06/07/2015   ALT 14 05/13/2016   AST 26 05/13/2016   NA 139 05/13/2016   K 3.7 05/13/2016   CL 102 01/11/2016   CREATININE 1.7 (H) 05/13/2016   BUN 33.2 (H) 05/13/2016   CO2 26 05/13/2016   TSH 0.60 06/07/2015   INR 0.98 05/03/2014    Mm Screening Breast Tomo Bilateral  Result Date: 06/03/2016 CLINICAL DATA:  Screening. EXAM: 2D DIGITAL SCREENING BILATERAL MAMMOGRAM WITH CAD AND ADJUNCT TOMO COMPARISON:  Previous exam(s). ACR Breast Density Category b: There are scattered areas of fibroglandular density. FINDINGS: There are no findings suspicious for malignancy. Images were processed with CAD. IMPRESSION: No mammographic evidence of malignancy. A result letter of this screening mammogram will be mailed directly to the patient. RECOMMENDATION: Screening mammogram in one year. (Code:SM-B-01Y) BI-RADS CATEGORY   1: Negative. Electronically Signed   By: Fidela Salisbury M.D.   On: 06/03/2016 16:02    Assessment & Plan:   There are no diagnoses linked to this encounter. I am having Ms. Huertas maintain her b complex vitamins, darbepoetin, Cholecalciferol, albuterol, levothyroxine, metoprolol tartrate, aspirin EC, escitalopram, furosemide, acetaminophen, triamcinolone cream, allopurinol, atorvastatin, Biotin, and pantoprazole.  No orders of the defined types were placed in this encounter.    Follow-up: No Follow-up on file.  [name], MD

## 2016-06-21 NOTE — Assessment & Plan Note (Signed)
On Vit D 

## 2016-06-21 NOTE — Assessment & Plan Note (Signed)
ASA, Toprol

## 2016-07-12 ENCOUNTER — Ambulatory Visit: Payer: Medicare Other

## 2016-07-12 ENCOUNTER — Other Ambulatory Visit (HOSPITAL_BASED_OUTPATIENT_CLINIC_OR_DEPARTMENT_OTHER): Payer: Medicare Other

## 2016-07-12 DIAGNOSIS — N184 Chronic kidney disease, stage 4 (severe): Secondary | ICD-10-CM

## 2016-07-12 DIAGNOSIS — D631 Anemia in chronic kidney disease: Secondary | ICD-10-CM

## 2016-07-12 LAB — CBC WITH DIFFERENTIAL/PLATELET
BASO%: 1.1 % (ref 0.0–2.0)
BASOS ABS: 0.1 10*3/uL (ref 0.0–0.1)
EOS ABS: 0.2 10*3/uL (ref 0.0–0.5)
EOS%: 3.6 % (ref 0.0–7.0)
HCT: 33.6 % — ABNORMAL LOW (ref 34.8–46.6)
HEMOGLOBIN: 11.1 g/dL — AB (ref 11.6–15.9)
LYMPH%: 24.7 % (ref 14.0–49.7)
MCH: 30.1 pg (ref 25.1–34.0)
MCHC: 33.1 g/dL (ref 31.5–36.0)
MCV: 91 fL (ref 79.5–101.0)
MONO#: 0.4 10*3/uL (ref 0.1–0.9)
MONO%: 5.5 % (ref 0.0–14.0)
NEUT#: 4.4 10*3/uL (ref 1.5–6.5)
NEUT%: 65.1 % (ref 38.4–76.8)
Platelets: 335 10*3/uL (ref 145–400)
RBC: 3.7 10*6/uL (ref 3.70–5.45)
RDW: 14.2 % (ref 11.2–14.5)
WBC: 6.8 10*3/uL (ref 3.9–10.3)
lymph#: 1.7 10*3/uL (ref 0.9–3.3)

## 2016-07-12 LAB — COMPREHENSIVE METABOLIC PANEL
ALBUMIN: 3.4 g/dL — AB (ref 3.5–5.0)
ALK PHOS: 94 U/L (ref 40–150)
ALT: 16 U/L (ref 0–55)
AST: 26 U/L (ref 5–34)
Anion Gap: 10 mEq/L (ref 3–11)
BUN: 33.6 mg/dL — ABNORMAL HIGH (ref 7.0–26.0)
CO2: 27 mEq/L (ref 22–29)
Calcium: 9.4 mg/dL (ref 8.4–10.4)
Chloride: 104 mEq/L (ref 98–109)
Creatinine: 1.9 mg/dL — ABNORMAL HIGH (ref 0.6–1.1)
EGFR: 24 mL/min/{1.73_m2} — AB (ref >90)
GLUCOSE: 104 mg/dL (ref 70–140)
POTASSIUM: 4 meq/L (ref 3.5–5.1)
Sodium: 141 mEq/L (ref 136–145)
Total Bilirubin: 0.73 mg/dL (ref 0.20–1.20)
Total Protein: 6.7 g/dL (ref 6.4–8.3)

## 2016-07-12 NOTE — Progress Notes (Signed)
Hgb  11.1  Today  07/12/16.   Pt did not meet parameters for Aranesp injection as per Dr. Ernestina Penna notes.   Spoke with pt in the lobby, and explanations given.  Pt voiced understanding.

## 2016-07-21 ENCOUNTER — Other Ambulatory Visit: Payer: Self-pay | Admitting: Internal Medicine

## 2016-08-09 ENCOUNTER — Other Ambulatory Visit: Payer: Self-pay

## 2016-08-17 ENCOUNTER — Other Ambulatory Visit: Payer: Self-pay | Admitting: Internal Medicine

## 2016-09-09 ENCOUNTER — Ambulatory Visit (INDEPENDENT_AMBULATORY_CARE_PROVIDER_SITE_OTHER): Payer: Medicare Other | Admitting: Nurse Practitioner

## 2016-09-09 ENCOUNTER — Encounter (INDEPENDENT_AMBULATORY_CARE_PROVIDER_SITE_OTHER): Payer: Self-pay

## 2016-09-09 ENCOUNTER — Other Ambulatory Visit: Payer: Self-pay | Admitting: *Deleted

## 2016-09-09 ENCOUNTER — Encounter: Payer: Self-pay | Admitting: Nurse Practitioner

## 2016-09-09 VITALS — BP 142/74 | HR 64 | Ht 63.0 in | Wt 162.0 lb

## 2016-09-09 DIAGNOSIS — R0600 Dyspnea, unspecified: Secondary | ICD-10-CM | POA: Diagnosis not present

## 2016-09-09 DIAGNOSIS — R002 Palpitations: Secondary | ICD-10-CM

## 2016-09-09 DIAGNOSIS — R55 Syncope and collapse: Secondary | ICD-10-CM

## 2016-09-09 DIAGNOSIS — I6523 Occlusion and stenosis of bilateral carotid arteries: Secondary | ICD-10-CM

## 2016-09-09 NOTE — Progress Notes (Signed)
CARDIOLOGY OFFICE NOTE  Date:  09/09/2016    [name] [name] Date of Birth: [birth year] Medical Record #[id number]  PCP:  Cassandria Anger, MD  Cardiologist:  [name]    Chief Complaint  Patient presents with  . Irregular Heart Beat    Work in visit - seen for Dr. [name]    History of Present Illness: [name] [name] is a 83 y.o. female who presents today for a work in visit. Seen for Dr. [name].   She has a history of HTN, palpitations (negative event monitor back in 2014) and fatigue. Has known PAD with prior RCEA with Dr. Amedeo Plenty and has residual carotid disease. Had diverticular bleed back in 2014 - off asa.   Hospitalized at [facility] for syncope in March 2015 -  had chest pain and cath with subsequent CABG  3/15 with Dr Humphrey Rolls with SVG d RCA, SVG OM1, distal circumflex and LIMA LAD. Had brief periop afib Rx with amidarone and left hospital in NSR.   Has not been seen here in over a year.   Comes in today. Here alone. Notes she is here for irregular breathing and heart beat. Has noted it with bending over. Notes it at night.  No real chest discomfort. Not very active as a general rule. Uses a walker. Has had a few falls over the past year - was admitted last December and found to have a pubic ramus fracture. She is not sure how she has fallen - thinks she may have passed out. No real exercise program but likes to stay active. She feels ok at rest. Some anxiety issues - thinks her Lexapro causes her shortness of breath. Notes an intermittent irregular heart beat that makes her dizzy - will not have every day but will have few spells in the course of a week. She is worried that something is wrong. Forgetting her Lipitor at 6pm - wants to take in the AM.  She is worried about weight loss.   Past Medical History:  Diagnosis Date  . Anemia, unspecified   . Anxiety state, unspecified   . Depressive disorder, not elsewhere classified   . Diverticulosis of colon (without mention of  hemorrhage)   . Diverticulosis of colon with hemorrhage   . Esophageal reflux   . Lumbago   . Myocardial infarction ([place])   . Other and unspecified hyperlipidemia   . Persistent disorder of initiating or maintaining sleep   . Thyroid disease   . Unspecified asthma(493.90)   . Unspecified disorder resulting from impaired renal function   . Unspecified essential hypertension    Dr [name]  . Unspecified osteomyelitis, site unspecified   . Unspecified vitamin D deficiency     Past Surgical History:  Procedure Laterality Date  . BONE MARROW BIOPSY    . CORONARY ARTERY BYPASS GRAFT  03/25/13   x4. CABS ON PUMP-Surgeon Jomarie Longs.  [name] PARTIAL COLECTOMY  1995   diverticulosis of colon iwth hemorrhage.   . status post right carotid enterectomy       Medications: Current Meds  Medication Sig  . acetaminophen (TYLENOL) 325 MG tablet Take 2 tablets (650 mg total) by mouth every 6 (six) hours as needed for mild pain (or Fever >/= 101).  [name] albuterol (PROVENTIL HFA;VENTOLIN HFA) 108 (90 Base) MCG/ACT inhaler Inhale 1-2 puffs into the lungs every 6 (six) hours as needed for wheezing or shortness of breath.  . allopurinol (ZYLOPRIM) 100 MG tablet Take 1 tablet (100 mg  total) by mouth daily.  [name] aspirin EC 81 MG tablet Take 1 tablet (81 mg total) by mouth daily.  [name] atorvastatin (LIPITOR) 40 MG tablet Take 1 tablet (40 mg total) by mouth daily at 6 PM.  . b complex vitamins capsule Take 1 capsule by mouth daily.  . Biotin 5000 MCG CAPS Take by mouth 1 day or 1 dose.  . Cholecalciferol (EQL VITAMIN D3) 1000 UNITS tablet Take 1 tablet (1,000 Units total) by mouth daily.  . darbepoetin (ARANESP) 100 MCG/0.5ML SOLN injection Inject 100 mcg into the skin every 8 (eight) weeks.  [name] escitalopram (LEXAPRO) 5 MG tablet TAKE 1 TABLET BY MOUTH EVERY DAY  . furosemide (LASIX) 20 MG tablet Take 1 tablet (20 mg total) by mouth daily as needed for fluid or edema.  [name] levothyroxine (SYNTHROID, LEVOTHROID) 50 MCG  tablet TAKE 1 TABLET BY MOUTH EVERY DAY BEFORE BREAKFAST  . metoprolol tartrate (LOPRESSOR) 25 MG tablet Take 1 tablet (25 mg total) by mouth 2 (two) times daily.  . pantoprazole (PROTONIX) 40 MG tablet Take 1 tablet (40 mg total) by mouth daily as needed.  . triamcinolone cream (KENALOG) 0.1 % Apply 1 application topically 3 (three) times daily.     Allergies: Allergies  Allergen Reactions  . Codeine Sulfate Nausea And Vomiting  . Darvon [Propoxyphene] Nausea And Vomiting    hallucinations  . Propoxyphene Napsylate Nausea And Vomiting  . Sulfacetamide Sodium-Sulfur Nausea And Vomiting  . Sulfamethoxazole-Trimethoprim Nausea Only    Can't remember reaction  . Zolpidem     Other reaction(s): Mental Status Changes (intolerance)    Social History: The patient  reports that she has never smoked. She has never used smokeless tobacco. She reports that she drinks alcohol. She reports that she does not use drugs.   Family History: The patient's family history includes Breast cancer in her cousin and cousin; COPD in her mother; Cancer in her father; Hypertension in her other.   Review of Systems: Please see the history of present illness.   Otherwise, the review of systems is positive for none.   All other systems are reviewed and negative.   Physical Exam: VS:  BP (!) 142/74 (BP Location: Left Arm, Patient Position: Sitting, Cuff Size: Normal)   Pulse 64   Ht '5\' 3"'  (1.6 m)   Wt 162 lb (73.5 kg)   BMI 28.70 kg/m  .  BMI Body mass index is 28.7 kg/m.  Wt Readings from Last 3 Encounters:  09/09/16 162 lb (73.5 kg)  06/21/16 160 lb 1.3 oz (72.6 kg)  05/13/16 163 lb 9.6 oz (74.2 kg)    General: Pleasant. Quite conversive. She is alert and in no acute distress. She has lost 5 pounds in the past year.   HEENT: Normal.  Neck: Supple, no JVD, carotid bruits, or masses noted.  Cardiac: Regular rate and rhythm. No murmurs, rubs, or gallops. No edema.  Respiratory:  Lungs are clear to  auscultation bilaterally with normal work of breathing.  GI: Soft and nontender.  MS: No deformity or atrophy. Gait and ROM intact.  Skin: Warm and dry. Color is normal.  Neuro:  Strength and sensation are intact and no gross focal deficits noted.  Psych: Alert, appropriate and with normal affect.   LABORATORY DATA:  EKG:  EKG is ordered today. This demonstrates NSR.  Lab Results  Component Value Date   WBC 6.8 07/12/2016   HGB 11.1 (L) 07/12/2016   HCT 33.6 (L) 07/12/2016   PLT  335 07/12/2016   GLUCOSE 104 07/12/2016   CHOL 154 06/07/2015   TRIG 96.0 06/07/2015   HDL 47.60 06/07/2015   LDLCALC 87 06/07/2015   ALT 16 07/12/2016   AST 26 07/12/2016   NA 141 07/12/2016   K 4.0 07/12/2016   CL 102 01/11/2016   CREATININE 1.9 (H) 07/12/2016   BUN 33.6 (H) 07/12/2016   CO2 27 07/12/2016   TSH 0.60 06/07/2015   INR 0.98 05/03/2014     BNP (last 3 results) No results for input(s): BNP in the last 8760 hours.  ProBNP (last 3 results) No results for input(s): PROBNP in the last 8760 hours.   Other Studies Reviewed Today:   Assessment/Plan:  1. Palpitations/?recurrent syncope - will arrange for event monitor and echocardiogram. Lab today as well.   2. Shortness of breath - probably multifactorial but will see what echo shows - for now no change in her medicines.   3. CAD/CABG:  2015  [facility] SVG OM/PLB  SVG RCA  LIMA LAD  No angina continue medical Rx  4. PAF:  In setting post CABG - she remains in NSR - concern for syncope/palpitations - will arrange for event monitor.   5. Carotid:  Left ICA 60-79% stenosis.  No TIA symptoms.  Continue antiplatelet Rx and F/U carotid duplex was due for repeat study in 6 months - will arrange  6. HLD - ok to change timing of her Lipitor so she wont' forget.    Current medicines are reviewed with the patient today.  The patient does not have concerns regarding medicines other than what has been noted above.  The following  changes have been made:  See above.  Labs/ tests ordered today include:    Orders Placed This Encounter  Procedures  . Basic metabolic panel  . CBC  . Hepatic function panel  . TSH  . Pro b natriuretic peptide (BNP)  . Cardiac event monitor  . EKG 12-Lead  . ECHOCARDIOGRAM COMPLETE     Disposition:   FU with Dr. [name] after studies complete.    Patient is agreeable to this plan and will call if any problems develop in the interim.   SignedTruitt Merle, NP  09/09/2016 12:18 PM  [facility] [address] [place], [place]  [address] Phone: [phone] Fax: [phone]

## 2016-09-09 NOTE — Patient Instructions (Addendum)
We will be checking the following labs today - BMET, CBC, HPF, TSH and BNP   Medication Instructions:    Continue with your current medicines.     Testing/Procedures To Be Arranged:  Carotid doppler study next month  Echocardiogram  Event monitor  Follow-Up:   See Dr. [name] after event monitor is complete to review your studies.     Other Special Instructions:   N/A    If you need a refill on your cardiac medications before your next appointment, please call your pharmacy.   Call the [place] office at [phone] if you have any questions, problems or concerns.

## 2016-09-10 LAB — CBC
Hematocrit: 28.4 % — ABNORMAL LOW (ref 34.0–46.6)
Hemoglobin: 9.7 g/dL — ABNORMAL LOW (ref 11.1–15.9)
MCH: 30.7 pg (ref 26.6–33.0)
MCHC: 34.2 g/dL (ref 31.5–35.7)
MCV: 90 fL (ref 79–97)
Platelets: 361 10*3/uL (ref 150–379)
RBC: 3.16 x10E6/uL — ABNORMAL LOW (ref 3.77–5.28)
RDW: 15.1 % (ref 12.3–15.4)
WBC: 6.5 10*3/uL (ref 3.4–10.8)

## 2016-09-10 LAB — BASIC METABOLIC PANEL
BUN/Creatinine Ratio: 18 (ref 12–28)
BUN: 33 mg/dL — ABNORMAL HIGH (ref 8–27)
CO2: 24 mmol/L (ref 20–29)
Calcium: 9.3 mg/dL (ref 8.7–10.3)
Chloride: 99 mmol/L (ref 96–106)
Creatinine, Ser: 1.87 mg/dL — ABNORMAL HIGH (ref 0.57–1.00)
GFR calc Af Amer: 28 mL/min/{1.73_m2} — ABNORMAL LOW (ref >59)
GFR calc non Af Amer: 25 mL/min/{1.73_m2} — ABNORMAL LOW (ref >59)
Glucose: 87 mg/dL (ref 65–99)
Potassium: 3.9 mmol/L (ref 3.5–5.2)
Sodium: 139 mmol/L (ref 134–144)

## 2016-09-10 LAB — HEPATIC FUNCTION PANEL
ALT: 13 IU/L (ref 0–32)
AST: 24 IU/L (ref 0–40)
Albumin: 4.1 g/dL (ref 3.5–4.7)
Alkaline Phosphatase: 112 IU/L (ref 39–117)
Bilirubin Total: 0.4 mg/dL (ref 0.0–1.2)
Bilirubin, Direct: 0.14 mg/dL (ref 0.00–0.40)
Total Protein: 6.7 g/dL (ref 6.0–8.5)

## 2016-09-10 LAB — PRO B NATRIURETIC PEPTIDE: NT-Pro BNP: 1191 pg/mL — ABNORMAL HIGH (ref 0–738)

## 2016-09-10 LAB — TSH: TSH: 2.34 u[IU]/mL (ref 0.450–4.500)

## 2016-09-11 ENCOUNTER — Other Ambulatory Visit: Payer: Self-pay | Admitting: Cardiovascular Disease

## 2016-09-11 ENCOUNTER — Ambulatory Visit (HOSPITAL_BASED_OUTPATIENT_CLINIC_OR_DEPARTMENT_OTHER): Payer: Medicare Other

## 2016-09-11 ENCOUNTER — Other Ambulatory Visit (HOSPITAL_BASED_OUTPATIENT_CLINIC_OR_DEPARTMENT_OTHER): Payer: Medicare Other

## 2016-09-11 VITALS — BP 133/65 | HR 68 | Temp 97.9°F | Resp 20

## 2016-09-11 DIAGNOSIS — N184 Chronic kidney disease, stage 4 (severe): Secondary | ICD-10-CM

## 2016-09-11 DIAGNOSIS — D631 Anemia in chronic kidney disease: Secondary | ICD-10-CM | POA: Diagnosis not present

## 2016-09-11 DIAGNOSIS — R0989 Other specified symptoms and signs involving the circulatory and respiratory systems: Secondary | ICD-10-CM

## 2016-09-11 DIAGNOSIS — N189 Chronic kidney disease, unspecified: Principal | ICD-10-CM

## 2016-09-11 LAB — CBC WITH DIFFERENTIAL/PLATELET
BASO%: 0.7 % (ref 0.0–2.0)
Basophils Absolute: 0 10*3/uL (ref 0.0–0.1)
EOS%: 3.6 % (ref 0.0–7.0)
Eosinophils Absolute: 0.2 10*3/uL (ref 0.0–0.5)
HEMATOCRIT: 29.3 % — AB (ref 34.8–46.6)
HEMOGLOBIN: 9.5 g/dL — AB (ref 11.6–15.9)
LYMPH#: 1.8 10*3/uL (ref 0.9–3.3)
LYMPH%: 28.8 % (ref 14.0–49.7)
MCH: 29.8 pg (ref 25.1–34.0)
MCHC: 32.4 g/dL (ref 31.5–36.0)
MCV: 91.8 fL (ref 79.5–101.0)
MONO#: 0.3 10*3/uL (ref 0.1–0.9)
MONO%: 5.6 % (ref 0.0–14.0)
NEUT#: 3.7 10*3/uL (ref 1.5–6.5)
NEUT%: 61.3 % (ref 38.4–76.8)
Platelets: 320 10*3/uL (ref 145–400)
RBC: 3.19 10*6/uL — ABNORMAL LOW (ref 3.70–5.45)
RDW: 14.3 % (ref 11.2–14.5)
WBC: 6.1 10*3/uL (ref 3.9–10.3)

## 2016-09-11 LAB — COMPREHENSIVE METABOLIC PANEL
ALBUMIN: 3.6 g/dL (ref 3.5–5.0)
ALK PHOS: 115 U/L (ref 40–150)
ALT: 13 U/L (ref 0–55)
AST: 26 U/L (ref 5–34)
Anion Gap: 10 mEq/L (ref 3–11)
BILIRUBIN TOTAL: 0.6 mg/dL (ref 0.20–1.20)
BUN: 35.4 mg/dL — AB (ref 7.0–26.0)
CALCIUM: 9.6 mg/dL (ref 8.4–10.4)
CO2: 26 mEq/L (ref 22–29)
CREATININE: 1.9 mg/dL — AB (ref 0.6–1.1)
Chloride: 104 mEq/L (ref 98–109)
EGFR: 24 mL/min/{1.73_m2} — ABNORMAL LOW (ref >90)
Glucose: 96 mg/dl (ref 70–140)
Potassium: 3.4 mEq/L — ABNORMAL LOW (ref 3.5–5.1)
Sodium: 140 mEq/L (ref 136–145)
TOTAL PROTEIN: 7.2 g/dL (ref 6.4–8.3)

## 2016-09-11 MED ORDER — DARBEPOETIN ALFA 200 MCG/0.4ML IJ SOSY
200.0000 ug | PREFILLED_SYRINGE | Freq: Once | INTRAMUSCULAR | Status: AC
  Administered 2016-09-11: 200 ug via SUBCUTANEOUS
  Filled 2016-09-11: qty 0.4

## 2016-09-11 NOTE — Patient Instructions (Signed)
Darbepoetin Alfa injection What is this medicine? DARBEPOETIN ALFA (dar be POE e tin AL fa) helps your body make more red blood cells. It is used to treat anemia caused by chronic kidney failure and chemotherapy. This medicine may be used for other purposes; ask your health care provider or pharmacist if you have questions. What should I tell my health care provider before I take this medicine? They need to know if you have any of these conditions: -blood clotting disorders or history of blood clots -cancer patient not on chemotherapy -cystic fibrosis -heart disease, such as angina, heart failure, or a history of a heart attack -hemoglobin level of 12 g/dL or greater -high blood pressure -low levels of folate, iron, or vitamin B12 -seizures -an unusual or allergic reaction to darbepoetin, erythropoietin, albumin, hamster proteins, latex, other medicines, foods, dyes, or preservatives -pregnant or trying to get pregnant -breast-feeding How should I use this medicine? This medicine is for injection into a vein or under the skin. It is usually given by a health care professional in a hospital or clinic setting. If you get this medicine at home, you will be taught how to prepare and give this medicine. Do not shake the solution before you withdraw a dose. Use exactly as directed. Take your medicine at regular intervals. Do not take your medicine more often than directed. It is important that you put your used needles and syringes in a special sharps container. Do not put them in a trash can. If you do not have a sharps container, call your pharmacist or healthcare provider to get one. Talk to your pediatrician regarding the use of this medicine in children. While this medicine may be used in children as young as 1 year for selected conditions, precautions do apply. Overdosage: If you think you have taken too much of this medicine contact a poison control center or emergency room at once. NOTE:  This medicine is only for you. Do not share this medicine with others. What if I miss a dose? If you miss a dose, take it as soon as you can. If it is almost time for your next dose, take only that dose. Do not take double or extra doses. What may interact with this medicine? Do not take this medicine with any of the following medications: -epoetin alfa This list may not describe all possible interactions. Give your health care provider a list of all the medicines, herbs, non-prescription drugs, or dietary supplements you use. Also tell them if you smoke, drink alcohol, or use illegal drugs. Some items may interact with your medicine. What should I watch for while using this medicine? Visit your prescriber or health care professional for regular checks on your progress and for the needed blood tests and blood pressure measurements. It is especially important for the doctor to make sure your hemoglobin level is in the desired range, to limit the risk of potential side effects and to give you the best benefit. Keep all appointments for any recommended tests. Check your blood pressure as directed. Ask your doctor what your blood pressure should be and when you should contact him or her. As your body makes more red blood cells, you may need to take iron, folic acid, or vitamin B supplements. Ask your doctor or health care provider which products are right for you. If you have kidney disease continue dietary restrictions, even though this medication can make you feel better. Talk with your doctor or health care professional about the   foods you eat and the vitamins that you take. What side effects may I notice from receiving this medicine? Side effects that you should report to your doctor or health care professional as soon as possible: -allergic reactions like skin rash, itching or hives, swelling of the face, lips, or tongue -breathing problems -changes in vision -chest pain -confusion, trouble speaking  or understanding -feeling faint or lightheaded, falls -high blood pressure -muscle aches or pains -pain, swelling, warmth in the leg -rapid weight gain -severe headaches -sudden numbness or weakness of the face, arm or leg -trouble walking, dizziness, loss of balance or coordination -seizures (convulsions) -swelling of the ankles, feet, hands -unusually weak or tired Side effects that usually do not require medical attention (report to your doctor or health care professional if they continue or are bothersome): -diarrhea -fever, chills (flu-like symptoms) -headaches -nausea, vomiting -redness, stinging, or swelling at site where injected This list may not describe all possible side effects. Call your doctor for medical advice about side effects. You may report side effects to FDA at 1-800-FDA-1088. Where should I keep my medicine? Keep out of the reach of children. Store in a refrigerator between 2 and 8 degrees C (36 and 46 degrees F). Do not freeze. Do not shake. Throw away any unused portion if using a single-dose vial. Throw away any unused medicine after the expiration date. NOTE: This sheet is a summary. It may not cover all possible information. If you have questions about this medicine, talk to your doctor, pharmacist, or health care provider.    2016, Elsevier/[employer]. (2007-12-22 10:23:57)  

## 2016-09-12 ENCOUNTER — Encounter (HOSPITAL_COMMUNITY): Payer: Self-pay

## 2016-09-19 ENCOUNTER — Ambulatory Visit (INDEPENDENT_AMBULATORY_CARE_PROVIDER_SITE_OTHER): Payer: Medicare Other

## 2016-09-19 ENCOUNTER — Other Ambulatory Visit: Payer: Self-pay

## 2016-09-19 ENCOUNTER — Encounter: Payer: Self-pay | Admitting: Physician Assistant

## 2016-09-19 ENCOUNTER — Ambulatory Visit (HOSPITAL_COMMUNITY): Payer: Medicare Other | Attending: Cardiovascular Disease

## 2016-09-19 DIAGNOSIS — Z951 Presence of aortocoronary bypass graft: Secondary | ICD-10-CM | POA: Diagnosis not present

## 2016-09-19 DIAGNOSIS — R55 Syncope and collapse: Secondary | ICD-10-CM | POA: Insufficient documentation

## 2016-09-19 DIAGNOSIS — R002 Palpitations: Secondary | ICD-10-CM

## 2016-09-19 DIAGNOSIS — I10 Essential (primary) hypertension: Secondary | ICD-10-CM | POA: Diagnosis not present

## 2016-09-19 DIAGNOSIS — I080 Rheumatic disorders of both mitral and aortic valves: Secondary | ICD-10-CM | POA: Diagnosis not present

## 2016-09-19 DIAGNOSIS — R0600 Dyspnea, unspecified: Secondary | ICD-10-CM | POA: Insufficient documentation

## 2016-09-24 ENCOUNTER — Ambulatory Visit (INDEPENDENT_AMBULATORY_CARE_PROVIDER_SITE_OTHER): Payer: Medicare Other | Admitting: Internal Medicine

## 2016-09-24 ENCOUNTER — Encounter: Payer: Self-pay | Admitting: Internal Medicine

## 2016-09-24 VITALS — BP 126/70 | HR 63 | Temp 97.8°F | Ht 63.0 in | Wt 162.0 lb

## 2016-09-24 DIAGNOSIS — E559 Vitamin D deficiency, unspecified: Secondary | ICD-10-CM

## 2016-09-24 DIAGNOSIS — I5020 Unspecified systolic (congestive) heart failure: Secondary | ICD-10-CM | POA: Diagnosis not present

## 2016-09-24 DIAGNOSIS — M8000XD Age-related osteoporosis with current pathological fracture, unspecified site, subsequent encounter for fracture with routine healing: Secondary | ICD-10-CM | POA: Diagnosis not present

## 2016-09-24 DIAGNOSIS — E039 Hypothyroidism, unspecified: Secondary | ICD-10-CM | POA: Diagnosis not present

## 2016-09-24 DIAGNOSIS — E785 Hyperlipidemia, unspecified: Secondary | ICD-10-CM | POA: Diagnosis not present

## 2016-09-24 DIAGNOSIS — G4700 Insomnia, unspecified: Secondary | ICD-10-CM | POA: Diagnosis not present

## 2016-09-24 DIAGNOSIS — Z23 Encounter for immunization: Secondary | ICD-10-CM

## 2016-09-24 DIAGNOSIS — N184 Chronic kidney disease, stage 4 (severe): Secondary | ICD-10-CM

## 2016-09-24 DIAGNOSIS — R5382 Chronic fatigue, unspecified: Secondary | ICD-10-CM

## 2016-09-24 DIAGNOSIS — I480 Paroxysmal atrial fibrillation: Secondary | ICD-10-CM

## 2016-09-24 DIAGNOSIS — I10 Essential (primary) hypertension: Secondary | ICD-10-CM | POA: Diagnosis not present

## 2016-09-24 DIAGNOSIS — I6523 Occlusion and stenosis of bilateral carotid arteries: Secondary | ICD-10-CM

## 2016-09-24 DIAGNOSIS — F411 Generalized anxiety disorder: Secondary | ICD-10-CM | POA: Diagnosis not present

## 2016-09-24 MED ORDER — ZOSTER VAC RECOMB ADJUVANTED 50 MCG/0.5ML IM SUSR: 0.5000 mL | Freq: Once | INTRAMUSCULAR | 1 refills | Status: AC

## 2016-09-24 NOTE — Assessment & Plan Note (Signed)
ASA,  Lopressor

## 2016-09-24 NOTE — Assessment & Plan Note (Signed)
BP Readings from Last 3 Encounters:  09/24/16 126/70  09/11/16 133/65  09/09/16 (!) 142/74

## 2016-09-24 NOTE — Assessment & Plan Note (Signed)
On Rx 

## 2016-09-24 NOTE — Assessment & Plan Note (Signed)
Chronic. 

## 2016-09-24 NOTE — Addendum Note (Signed)
Addended by: Karren Cobble on: 09/24/2016 02:40 PM   Modules accepted: Orders

## 2016-09-24 NOTE — Assessment & Plan Note (Signed)
Lorazepam prn - "is the only thing that helps"  Potential benefits of a long term benzodiazepines  use as well as potential risks  and complications were explained to the patient and were aknowledged.

## 2016-09-24 NOTE — Assessment & Plan Note (Signed)
On Lipitor 

## 2016-09-24 NOTE — Assessment & Plan Note (Signed)
Lorazepam prn; Lexapro  Potential benefits of a long term benzodiazepines  use as well as potential risks  and complications were explained to the patient and were aknowledged.

## 2016-09-24 NOTE — Progress Notes (Signed)
Subjective:  Patient ID: [name] [name], female    DOB: 07/28/1931  Age: 83 y.o. MRN: [id number]  CC: No chief complaint on file.   HPI [name] [name] presents for CAD, CHF, hypothyroidism, CKD f/u  Outpatient Medications Prior to Visit  Medication Sig Dispense Refill  . acetaminophen (TYLENOL) 325 MG tablet Take 2 tablets (650 mg total) by mouth every 6 (six) hours as needed for mild pain (or Fever >/= 101). 30 tablet 0  . albuterol (PROVENTIL HFA;VENTOLIN HFA) 108 (90 Base) MCG/ACT inhaler Inhale 1-2 puffs into the lungs every 6 (six) hours as needed for wheezing or shortness of breath. 1 Inhaler 1  . allopurinol (ZYLOPRIM) 100 MG tablet Take 1 tablet (100 mg total) by mouth daily. 90 tablet 3  . aspirin EC 81 MG tablet Take 1 tablet (81 mg total) by mouth daily. 90 tablet 3  . atorvastatin (LIPITOR) 40 MG tablet Take 1 tablet (40 mg total) by mouth daily at 6 PM. 90 tablet 1  . b complex vitamins capsule Take 1 capsule by mouth daily.    . Biotin 5000 MCG CAPS Take by mouth 1 day or 1 dose.    . Cholecalciferol (EQL VITAMIN D3) 1000 UNITS tablet Take 1 tablet (1,000 Units total) by mouth daily. 100 tablet 3  . darbepoetin (ARANESP) 100 MCG/0.5ML SOLN injection Inject 100 mcg into the skin every 8 (eight) weeks.    [name] escitalopram (LEXAPRO) 5 MG tablet TAKE 1 TABLET BY MOUTH EVERY DAY 30 tablet 5  . furosemide (LASIX) 20 MG tablet Take 1 tablet (20 mg total) by mouth daily as needed for fluid or edema. 30 tablet 0  . levothyroxine (SYNTHROID, LEVOTHROID) 50 MCG tablet TAKE 1 TABLET BY MOUTH EVERY DAY BEFORE BREAKFAST 90 tablet 1  . metoprolol tartrate (LOPRESSOR) 25 MG tablet Take 1 tablet (25 mg total) by mouth 2 (two) times daily. 180 tablet 3  . pantoprazole (PROTONIX) 40 MG tablet Take 1 tablet (40 mg total) by mouth daily as needed. 90 tablet 2  . triamcinolone cream (KENALOG) 0.1 % Apply 1 application topically 3 (three) times daily. 30 g 0   Facility-Administered Medications  Prior to Visit  Medication Dose Route Frequency Provider Last Rate Last Dose  . Darbepoetin Alfa (ARANESP) injection 200 mcg  200 mcg Subcutaneous Once [name], MD      . Darbepoetin Alfa Kyra Searles) injection 500 mcg  500 mcg Subcutaneous Once [name], MD        ROS Review of Systems  Constitutional: Positive for fatigue. Negative for activity change, appetite change, chills and unexpected weight change.  HENT: Negative for congestion, mouth sores and sinus pressure.   Eyes: Negative for visual disturbance.  Respiratory: Positive for shortness of breath. Negative for cough and chest tightness.   Gastrointestinal: Negative for abdominal pain and nausea.  Genitourinary: Negative for difficulty urinating, frequency and vaginal pain.  Musculoskeletal: Negative for back pain and gait problem.  Skin: Negative for pallor and rash.  Neurological: Negative for dizziness, tremors, weakness, numbness and headaches.  Psychiatric/Behavioral: Positive for sleep disturbance. Negative for confusion.    Objective:  BP 126/70 (BP Location: Left Arm, Patient Position: Sitting, Cuff Size: Large)   Pulse 63   Temp 97.8 F (36.6 C) (Oral)   Ht 5\' 3"  (1.6 m)   Wt 162 lb (73.5 kg)   SpO2 99%   BMI 28.70 kg/m   BP Readings from Last 3 Encounters:  09/24/16 126/70  09/11/16 133/65  09/09/16 (!) 142/74    Wt Readings from Last 3 Encounters:  09/24/16 162 lb (73.5 kg)  09/09/16 162 lb (73.5 kg)  06/21/16 160 lb 1.3 oz (72.6 kg)    Physical Exam  Constitutional: She appears well-developed. No distress.  HENT:  Head: Normocephalic.  Right Ear: External ear normal.  Left Ear: External ear normal.  Nose: Nose normal.  Mouth/Throat: Oropharynx is clear and moist.  Eyes: Pupils are equal, round, and reactive to light. Conjunctivae are normal. Right eye exhibits no discharge. Left eye exhibits no discharge.  Neck: Normal range of motion. Neck supple. No JVD present. No tracheal deviation present.  No thyromegaly present.  Cardiovascular: Normal rate, regular rhythm and normal heart sounds.   Pulmonary/Chest: No stridor. No respiratory distress. She has no wheezes.  Abdominal: Soft. Bowel sounds are normal. She exhibits no distension and no mass. There is no tenderness. There is no rebound and no guarding.  Musculoskeletal: She exhibits no edema or tenderness.  Lymphadenopathy:    She has no cervical adenopathy.  Neurological: She displays normal reflexes. No cranial nerve deficit. She exhibits normal muscle tone. Coordination abnormal.  Skin: No rash noted. No erythema.  Psychiatric: She has a normal mood and affect. Her behavior is normal. Judgment and thought content normal.  Obese Ataxic Walker Heart monitor is on  Lab Results  Component Value Date   WBC 6.1 09/11/2016   HGB 9.5 (L) 09/11/2016   HCT 29.3 (L) 09/11/2016   PLT 320 09/11/2016   GLUCOSE 96 09/11/2016   CHOL 154 06/07/2015   TRIG 96.0 06/07/2015   HDL 47.60 06/07/2015   LDLCALC 87 06/07/2015   ALT 13 09/11/2016   AST 26 09/11/2016   NA 140 09/11/2016   K 3.4 (L) 09/11/2016   CL 99 09/09/2016   CREATININE 1.9 (H) 09/11/2016   BUN 35.4 (H) 09/11/2016   CO2 26 09/11/2016   TSH 2.340 09/09/2016   INR 0.98 05/03/2014    Mm Screening Breast Tomo Bilateral  Result Date: 06/03/2016 CLINICAL DATA:  Screening. EXAM: 2D DIGITAL SCREENING BILATERAL MAMMOGRAM WITH CAD AND ADJUNCT TOMO COMPARISON:  Previous exam(s). ACR Breast Density Category b: There are scattered areas of fibroglandular density. FINDINGS: There are no findings suspicious for malignancy. Images were processed with CAD. IMPRESSION: No mammographic evidence of malignancy. A result letter of this screening mammogram will be mailed directly to the patient. RECOMMENDATION: Screening mammogram in one year. (Code:SM-B-01Y) BI-RADS CATEGORY  1: Negative. Electronically Signed   By: Fidela Salisbury M.D.   On: 06/03/2016 16:02    Assessment & Plan:    There are no diagnoses linked to this encounter. I am having [name] maintain her b complex vitamins, darbepoetin, Cholecalciferol, albuterol, metoprolol tartrate, aspirin EC, furosemide, acetaminophen, triamcinolone cream, allopurinol, atorvastatin, Biotin, pantoprazole, levothyroxine, and escitalopram.  No orders of the defined types were placed in this encounter.    Follow-up: No Follow-up on file.  [name], MD

## 2016-09-24 NOTE — Assessment & Plan Note (Signed)
Lab Results  Component Value Date   CREATININE 1.9 (H) 09/11/2016

## 2016-09-24 NOTE — Assessment & Plan Note (Signed)
On Vit D 

## 2016-09-24 NOTE — Assessment & Plan Note (Addendum)
Labs Furosemide - taking 20 mg, Lopressor

## 2016-09-24 NOTE — Assessment & Plan Note (Signed)
Vit D Pt declined other options for Rx

## 2016-09-24 NOTE — Patient Instructions (Signed)
MC well w/Jill 

## 2016-09-26 ENCOUNTER — Ambulatory Visit (HOSPITAL_COMMUNITY)
Admission: RE | Admit: 2016-09-26 | Discharge: 2016-09-26 | Disposition: A | Discharge status: Home / Self Care | Payer: Medicare Other | Source: Ambulatory Visit | Attending: Internal Medicine | Admitting: Internal Medicine

## 2016-09-26 DIAGNOSIS — R0989 Other specified symptoms and signs involving the circulatory and respiratory systems: Secondary | ICD-10-CM | POA: Diagnosis not present

## 2016-09-26 DIAGNOSIS — I6523 Occlusion and stenosis of bilateral carotid arteries: Secondary | ICD-10-CM | POA: Diagnosis not present

## 2016-10-15 ENCOUNTER — Other Ambulatory Visit: Payer: Self-pay | Admitting: Cardiovascular Disease

## 2016-10-18 ENCOUNTER — Encounter: Payer: Self-pay | Admitting: Cardiovascular Disease

## 2016-10-22 ENCOUNTER — Telehealth: Payer: Self-pay

## 2016-10-22 NOTE — Telephone Encounter (Signed)
Spoke with patient and she is aware of her new date and time

## 2016-10-29 NOTE — Progress Notes (Signed)
CARDIOLOGY OFFICE NOTE  Date:  10/30/2016    ARIAHNA [name] Date of Birth: [birth year] Medical Record #[id number]  PCP:  Cassandria Anger, MD  Cardiologist:  [name]    No chief complaint on file.   History of Present Illness: [name] [name] is a 83 y.o. female f/u CAD/CABG CEA and palpitations     She has a history of HTN, palpitations (negative event monitor back in 2014) and fatigue. Has known PAD with prior RCEA with Dr. Amedeo Plenty and has residual carotid disease. Had diverticular bleed back in 2014 - off asa.   Hospitalized at [facility] for syncope in March 2015 -  had chest pain and cath with subsequent CABG  3/15 with Dr Humphrey Rolls with SVG d RCA, SVG OM1, distal circumflex and LIMA LAD. Had brief periop afib Rx with amidarone and left hospital in NSR.   Seen by PA 09/09/16 for dyspnea and palpitations Echo and event monitor ok in f/u  Primary issue is insomnia and some anxiety on Lexapro    Past Medical History:  Diagnosis Date  . Anemia, unspecified   . Anxiety state, unspecified   . Depressive disorder, not elsewhere classified   . Diverticulosis of colon (without mention of hemorrhage)   . Diverticulosis of colon with hemorrhage   . Esophageal reflux   . History of echocardiogram    Echo 8/18: EF 55-60, normal wall motion, grade 2 diastolic dysfunction, mild AI, mild MR, PASP 32  . Lumbago   . Myocardial infarction ([place])   . Other and unspecified hyperlipidemia   . Persistent disorder of initiating or maintaining sleep   . Thyroid disease   . Unspecified asthma(493.90)   . Unspecified disorder resulting from impaired renal function   . Unspecified essential hypertension    Dr [name]  . Unspecified osteomyelitis, site unspecified   . Unspecified vitamin D deficiency     Past Surgical History:  Procedure Laterality Date  . BONE MARROW BIOPSY    . CORONARY ARTERY BYPASS GRAFT  03/25/13   x4. CABS ON PUMP-Surgeon Jomarie Longs.  [name] PARTIAL COLECTOMY  1995   diverticulosis of colon iwth hemorrhage.   . status post right carotid enterectomy       Medications: Current Meds  Medication Sig  . acetaminophen (TYLENOL) 325 MG tablet Take 2 tablets (650 mg total) by mouth every 6 (six) hours as needed for mild pain (or Fever >/= 101).  [name] albuterol (PROVENTIL HFA;VENTOLIN HFA) 108 (90 Base) MCG/ACT inhaler Inhale 1-2 puffs into the lungs every 6 (six) hours as needed for wheezing or shortness of breath.  . allopurinol (ZYLOPRIM) 100 MG tablet Take 1 tablet (100 mg total) by mouth daily.  [name] aspirin EC 81 MG tablet Take 1 tablet (81 mg total) by mouth daily.  [name] atorvastatin (LIPITOR) 40 MG tablet Take 1 tablet (40 mg total) by mouth daily at 6 PM.  . b complex vitamins capsule Take 1 capsule by mouth daily.  . Biotin 5000 MCG CAPS Take by mouth 1 day or 1 dose.  . Cholecalciferol (EQL VITAMIN D3) 1000 UNITS tablet Take 1 tablet (1,000 Units total) by mouth daily.  . darbepoetin (ARANESP) 100 MCG/0.5ML SOLN injection Inject 100 mcg into the skin every 8 (eight) weeks.  [name] escitalopram (LEXAPRO) 5 MG tablet TAKE 1 TABLET BY MOUTH EVERY DAY  . furosemide (LASIX) 20 MG tablet TAKE 1 TABLET BY MOUTH EVERY DAY  . levothyroxine (SYNTHROID, LEVOTHROID) 50 MCG tablet TAKE 1 TABLET  BY MOUTH EVERY DAY BEFORE BREAKFAST  . metoprolol tartrate (LOPRESSOR) 25 MG tablet Take 1 tablet (25 mg total) by mouth 2 (two) times daily.  . pantoprazole (PROTONIX) 40 MG tablet Take 1 tablet (40 mg total) by mouth daily as needed.  . triamcinolone cream (KENALOG) 0.1 % Apply 1 application topically 3 (three) times daily.     Allergies: Allergies  Allergen Reactions  . Codeine Sulfate Nausea And Vomiting  . Darvon [Propoxyphene] Nausea And Vomiting    hallucinations  . Propoxyphene Napsylate Nausea And Vomiting  . Sulfacetamide Sodium-Sulfur Nausea And Vomiting  . Sulfamethoxazole-Trimethoprim Nausea Only    Can't remember reaction  . Zolpidem     Other reaction(s): Mental  Status Changes (intolerance)    Social History: The patient  reports that she has never smoked. She has never used smokeless tobacco. She reports that she drinks alcohol. She reports that she does not use drugs.   Family History: The patient's family history includes Breast cancer in her cousin and cousin; COPD in her mother; Cancer in her father; Hypertension in her other.   Review of Systems: Please see the history of present illness.   Otherwise, the review of systems is positive for none.   All other systems are reviewed and negative.   Physical Exam: VS:  BP (!) 160/72   Pulse 74   Ht '5\' 3"'  (1.6 m)   Wt 161 lb 6.4 oz (73.2 kg)   SpO2 97%   BMI 28.59 kg/m  .  BMI Body mass index is 28.59 kg/m.  Wt Readings from Last 3 Encounters:  10/30/16 161 lb 6.4 oz (73.2 kg)  09/24/16 162 lb (73.5 kg)  09/09/16 162 lb (73.5 kg)    Affect appropriate Healthy:  appears stated age 48: normal Neck supple with no adenopathy JVP normal no bruits no thyromegaly Lungs clear with no wheezing and good diaphragmatic motion Heart:  S1/S2 no murmur, no rub, gallop or click PMI normal Abdomen: benighn, BS positve, no tenderness, no AAA no bruit.  No HSM or HJR Distal pulses intact with no bruits No edema Neuro non-focal Skin warm and dry No muscular weakness    LABORATORY DATA:  EKG:   SR rate 64 borderline LVH lateral T wave changes I,AVl 09/09/16   Lab Results  Component Value Date   WBC 6.1 09/11/2016   HGB 9.5 (L) 09/11/2016   HCT 29.3 (L) 09/11/2016   PLT 320 09/11/2016   GLUCOSE 96 09/11/2016   CHOL 154 06/07/2015   TRIG 96.0 06/07/2015   HDL 47.60 06/07/2015   LDLCALC 87 06/07/2015   ALT 13 09/11/2016   AST 26 09/11/2016   NA 140 09/11/2016   K 3.4 (L) 09/11/2016   CL 99 09/09/2016   CREATININE 1.9 (H) 09/11/2016   BUN 35.4 (H) 09/11/2016   CO2 26 09/11/2016   TSH 2.340 09/09/2016   INR 0.98 05/03/2014     BNP (last 3 results) No results for input(s): BNP  in the last 8760 hours.  ProBNP (last 3 results)  Recent Labs  09/09/16 1245  PROBNP 1,191*     Other Studies Reviewed Today:   Assessment/Plan:  1. Palpitations/?recurrent syncope -  Event monitor clearly showed NSR with her symptoms 09/19/16  2. Shortness of breath -   Doubt cardiac echo reviewed 09/19/16 EF 55-60%  3. CAD/CABG:  2015  [facility] SVG OM/PLB  SVG RCA  LIMA LAD  No angina continue medical Rx  4. PAF:  In setting post  CABG - she remains in NSR    5. Carotid: plaque no stenosis by duplex 09/26/2016 previously higher on left f/u duplex June 2019  6. HLD - continue lipitor target LDL less than 70    7. Anxiety: with insomnia f/u primary continue lexapro    Current medicines are reviewed with the patient today.  The patient does not have concerns regarding medicines other than what has been noted above.  The following changes have been made:  See above.  Labs/ tests ordered today include:    No orders of the defined types were placed in this encounter.    Disposition:   FU with me in a year    Patient is agreeable to this plan and will call if any problems develop in the interim.   Signed: Jenkins Rouge, MD  10/30/2016 9:44 AM  [facility] [address] [place], [place]  [address] Phone: [phone] Fax: [phone]

## 2016-10-30 ENCOUNTER — Ambulatory Visit (INDEPENDENT_AMBULATORY_CARE_PROVIDER_SITE_OTHER): Payer: Medicare Other | Admitting: Cardiovascular Disease

## 2016-10-30 ENCOUNTER — Encounter: Payer: Self-pay | Admitting: Cardiovascular Disease

## 2016-10-30 VITALS — BP 160/72 | HR 74 | Ht 63.0 in | Wt 161.4 lb

## 2016-10-30 DIAGNOSIS — I2510 Atherosclerotic heart disease of native coronary artery without angina pectoris: Secondary | ICD-10-CM | POA: Diagnosis not present

## 2016-10-30 DIAGNOSIS — I6523 Occlusion and stenosis of bilateral carotid arteries: Secondary | ICD-10-CM

## 2016-10-30 NOTE — Patient Instructions (Addendum)

## 2016-11-01 ENCOUNTER — Other Ambulatory Visit: Payer: Self-pay | Admitting: Cardiovascular Disease

## 2016-11-04 ENCOUNTER — Other Ambulatory Visit: Payer: Self-pay | Admitting: Cardiovascular Disease

## 2016-11-05 ENCOUNTER — Telehealth: Payer: Self-pay | Admitting: Internal Medicine

## 2016-11-05 NOTE — Telephone Encounter (Signed)
Medication Detail    Disp Refills Start End   metoprolol tartrate (LOPRESSOR) 25 MG tablet 180 tablet 3 11/01/2016    Sig: TAKE 1/2 TABLET BY MOUTH TWICE A DAY   Sent to pharmacy as: metoprolol tartrate (LOPRESSOR) 25 MG tablet   E-Prescribing Status: Receipt confirmed by pharmacy (11/01/2016 11:00 AM EDT)   Pharmacy   [id number] - , [place] AT [place]

## 2016-11-05 NOTE — Telephone Encounter (Signed)
Please resend metoprolol again to pharmacy.  Pharmacy is telling patient they never got script.

## 2016-11-05 NOTE — Telephone Encounter (Signed)
Metoprolol was given by her cardiologist (Dr. [name]) she need to contact their offi e to have them to resend...Johny Chess

## 2016-11-06 NOTE — Telephone Encounter (Signed)
Patient was notified.

## 2016-11-11 ENCOUNTER — Ambulatory Visit: Payer: Self-pay | Admitting: Hematology

## 2016-11-11 ENCOUNTER — Other Ambulatory Visit: Payer: Self-pay

## 2016-11-11 ENCOUNTER — Ambulatory Visit: Payer: Self-pay

## 2016-11-14 ENCOUNTER — Other Ambulatory Visit: Payer: Self-pay | Admitting: Internal Medicine

## 2016-11-15 ENCOUNTER — Ambulatory Visit: Payer: Self-pay | Admitting: Hematology

## 2016-11-15 ENCOUNTER — Other Ambulatory Visit: Payer: Self-pay

## 2016-11-15 ENCOUNTER — Ambulatory Visit (HOSPITAL_BASED_OUTPATIENT_CLINIC_OR_DEPARTMENT_OTHER): Payer: Medicare Other | Admitting: Hematology

## 2016-11-15 ENCOUNTER — Telehealth: Payer: Self-pay

## 2016-11-15 ENCOUNTER — Ambulatory Visit: Payer: Self-pay

## 2016-11-15 ENCOUNTER — Ambulatory Visit (HOSPITAL_BASED_OUTPATIENT_CLINIC_OR_DEPARTMENT_OTHER): Payer: Medicare Other

## 2016-11-15 VITALS — BP 153/66 | HR 73 | Temp 97.5°F | Resp 20 | Ht 63.0 in | Wt 162.6 lb

## 2016-11-15 DIAGNOSIS — I10 Essential (primary) hypertension: Secondary | ICD-10-CM

## 2016-11-15 DIAGNOSIS — N184 Chronic kidney disease, stage 4 (severe): Secondary | ICD-10-CM

## 2016-11-15 DIAGNOSIS — N189 Chronic kidney disease, unspecified: Principal | ICD-10-CM

## 2016-11-15 DIAGNOSIS — M810 Age-related osteoporosis without current pathological fracture: Secondary | ICD-10-CM

## 2016-11-15 DIAGNOSIS — I509 Heart failure, unspecified: Secondary | ICD-10-CM | POA: Diagnosis not present

## 2016-11-15 DIAGNOSIS — D631 Anemia in chronic kidney disease: Secondary | ICD-10-CM

## 2016-11-15 LAB — CBC WITH DIFFERENTIAL/PLATELET
BASO%: 0.5 % (ref 0.0–2.0)
Basophils Absolute: 0 10*3/uL (ref 0.0–0.1)
EOS%: 3.8 % (ref 0.0–7.0)
Eosinophils Absolute: 0.2 10*3/uL (ref 0.0–0.5)
HEMATOCRIT: 32 % — AB (ref 34.8–46.6)
HEMOGLOBIN: 10.6 g/dL — AB (ref 11.6–15.9)
LYMPH#: 1.9 10*3/uL (ref 0.9–3.3)
LYMPH%: 30.2 % (ref 14.0–49.7)
MCH: 30.7 pg (ref 25.1–34.0)
MCHC: 33.1 g/dL (ref 31.5–36.0)
MCV: 92.8 fL (ref 79.5–101.0)
MONO#: 0.4 10*3/uL (ref 0.1–0.9)
MONO%: 6.2 % (ref 0.0–14.0)
NEUT%: 59.3 % (ref 38.4–76.8)
NEUTROS ABS: 3.6 10*3/uL (ref 1.5–6.5)
NRBC: 0 % (ref 0–0)
Platelets: 311 10*3/uL (ref 145–400)
RBC: 3.45 10*6/uL — ABNORMAL LOW (ref 3.70–5.45)
RDW: 13.5 % (ref 11.2–14.5)
WBC: 6.1 10*3/uL (ref 3.9–10.3)

## 2016-11-15 LAB — COMPREHENSIVE METABOLIC PANEL
ALT: 13 U/L (ref 0–55)
ANION GAP: 8 meq/L (ref 3–11)
AST: 26 U/L (ref 5–34)
Albumin: 3.5 g/dL (ref 3.5–5.0)
Alkaline Phosphatase: 103 U/L (ref 40–150)
BUN: 30.6 mg/dL — ABNORMAL HIGH (ref 7.0–26.0)
CALCIUM: 9.3 mg/dL (ref 8.4–10.4)
CHLORIDE: 106 meq/L (ref 98–109)
CO2: 24 mEq/L (ref 22–29)
Creatinine: 1.7 mg/dL — ABNORMAL HIGH (ref 0.6–1.1)
EGFR: 28 mL/min/{1.73_m2} — AB (ref >60)
Glucose: 115 mg/dl (ref 70–140)
POTASSIUM: 3.9 meq/L (ref 3.5–5.1)
SODIUM: 139 meq/L (ref 136–145)
TOTAL PROTEIN: 6.9 g/dL (ref 6.4–8.3)
Total Bilirubin: 0.62 mg/dL (ref 0.20–1.20)

## 2016-11-15 MED ORDER — DARBEPOETIN ALFA 200 MCG/0.4ML IJ SOSY
200.0000 ug | PREFILLED_SYRINGE | Freq: Once | INTRAMUSCULAR | Status: AC
  Administered 2016-11-15: 200 ug via SUBCUTANEOUS
  Filled 2016-11-15: qty 0.4

## 2016-11-15 NOTE — Telephone Encounter (Signed)
Called and ask to fax the DEXA report.

## 2016-11-15 NOTE — Progress Notes (Signed)
[facility] PROGRESS NOTE DATE OF VISIT: 11/15/2016  Plotnikov, [name] Lacks, MD [place] [address]  DIAGNOSIS: Anemia in stage 4 chronic kidney disease ([place])  CHIEF COMPLAIN: f/u anemia   CURRENT THERAPY: Aranesp 200 mcg subcutaneous every 2 months for hemoglobin less than 11.  INTERVAL HISTORY:  [name] [name] 83 y.o. female with a history of anemia secondary to renal insufficiency is here for follow-up. She notes 2 nights ago she felt tired and week and had severe explosive diarrhea that was purely brown liquid. She thinks this is due to the food she ate that day. This has now resolved. She denied having a fever. She has not had another BM today and had not eaten much in the past day. She had not gotten enough liquid intake yesterday. She feels slightly dizzy from not eating much.  Last month she had irregular heart beat and tachycardic episodes when she lays her head down to go to bed at night. This will dissipate the longer she lays there. A cardiac work up was done but nothing was seen by cardiologist.  She lives by herself and stays active and moving.  She has a hernia in her abdomen and denies any pain from it.      MEDICAL HISTORY: Past Medical History:  Diagnosis Date  . Anemia, unspecified   . Anxiety state, unspecified   . Depressive disorder, not elsewhere classified   . Diverticulosis of colon (without mention of hemorrhage)   . Diverticulosis of colon with hemorrhage   . Esophageal reflux   . History of echocardiogram    Echo 8/18: EF 55-60, normal wall motion, grade 2 diastolic dysfunction, mild AI, mild MR, PASP 32  . Lumbago   . Myocardial infarction ([place])   . Other and unspecified hyperlipidemia   . Persistent disorder of initiating or maintaining sleep   . Thyroid disease   . Unspecified asthma(493.90)   . Unspecified disorder resulting from impaired renal function   . Unspecified essential hypertension    Dr  [name]  . Unspecified osteomyelitis, site unspecified   . Unspecified vitamin D deficiency    INTERIM HISTORY: has Vitamin D deficiency; Dyslipidemia; DEHYDRATION (VOLUME DEPLETION); Anxiety state; INSOMNIA, PERSISTENT; Essential hypertension; Coronary atherosclerosis; Bilateral carotid bruits; PERIPHERAL VASCULAR DISEASE; Asthma; GERD; DIVERTICULOSIS, COLON; Diverticulosis of colon with hemorrhage; PANCREATITIS; Disorder resulting from impaired renal function; PYELONEPHRITIS; UNSPECIFIED DISORDER OF URETHRA&URINARY TRACT; LOW BACK PAIN; SYNCOPE; Fatigue; Anemia in chronic renal disease; Constipation; Hypothyroidism; A-fib ([place]); CHF (congestive heart failure) ([place]); PNA (pneumonia); Thyroid nodule; Influenza due to influenza A virus; Minimal cognitive impairment; Neuropathy; Disuse syndrome; Restless leg; Left leg weakness; Fall against object; Left ankle pain; Primary localized osteoarthrosis, lower leg; Alopecia; Pes anserine bursitis; Situational depression; Decreased ambulation status; Left-sided low back pain with sciatica; CKD (chronic kidney disease) stage 4, GFR 15-29 ml/min ([place]); Back pain; Compression fracture of T12 vertebra with delayed healing; Poor tolerance for ambulation; Osteoporosis; Piriformis syndrome of left side; Anemia; Acute gout; Neck pain, musculoskeletal; Pubic ramus fracture ([place]); Herpes zoster; and Pelvis fracture, right, sequela on her problem list.    ALLERGIES:  is allergic to codeine sulfate; darvon [propoxyphene]; propoxyphene napsylate; sulfacetamide sodium-sulfur; sulfamethoxazole-trimethoprim; and zolpidem.  MEDICATIONS: has a current medication list which includes the following prescription(s): acetaminophen, albuterol, aspirin, atorvastatin, b complex vitamins, biotin, cholecalciferol, darbepoetin, escitalopram, furosemide, levothyroxine, metoprolol tartrate, pantoprazole, sennosides-docusate sodium, cyclobenzaprine, and polyethylene glycol powder, and the following  Facility-Administered Medications: darbepoetin alfa and darbepoetin alfa.  SURGICAL HISTORY:  Past Surgical History:  Procedure Laterality Date  . BONE MARROW BIOPSY    . CORONARY ARTERY BYPASS GRAFT  03/25/13   x4. CABS ON PUMP-Surgeon Jomarie Longs.  [name] PARTIAL COLECTOMY  1995   diverticulosis of colon iwth hemorrhage.   . status post right carotid enterectomy      REVIEW OF SYSTEMS:  Constitutional: Denies fevers, chills (+) mild fatigue (+) dizziness Eyes: Denies blurriness of vision Ears, nose, mouth, throat, and face: Denies mucositis or sore throat Respiratory: Denies cough, dyspnea or wheezes Cardiovascular: Denies palpitation, chest discomfort or lower extremity swelling (+) elevated heart rate and change in heart beat when she lays down at night. Gastrointestinal:  Denies nausea, heartburn or change in bowel habits (+) abdominal hernia Skin: Denies abnormal skin rashes Lymphatics: Denies new lymphadenopathy or easy bruising Musculoskeletal: negative Neurological: Denies numbness, tingling or new weaknesses Behavioral/Psych: Mood is stable, no new changes  All other systems were reviewed with the patient and are negative.  PHYSICAL EXAMINATION: ECOG PERFORMANCE STATUS: 1  Blood pressure (!) 153/66, pulse 73, temperature (!) 97.5 F (36.4 C), temperature source Oral, resp. rate 20, height '5\' 3"'  (1.6 m), weight 162 lb 9.6 oz (73.8 kg), SpO2 99 %.   GENERAL:alert, no distress and comfortable; elderly female who is well developed, well nourished.  SKIN: skin color, texture, turgor are normal, no rashes or significant lesions; scar on chest well healed.  EYES: normal, Conjunctiva are pink and non-injected, sclera clear OROPHARYNX:no exudate, no erythema and lips, buccal mucosa, and tongue normal  NECK: supple, thyroid normal size, non-tender, without nodularity LYMPH:  no palpable lymphadenopathy in the cervical, axillary or supraclavicular LUNGS: clear to auscultation and  percussion with normal breathing effort HEART: regular rate & rhythm and no murmurs and no lower extremity edema ABDOMEN:abdomen soft, non-tender and normal bowel sounds (+) protruding hernia from abdomen, no tenderness, soft Musculoskeletal:no cyanosis of digits and no clubbing  NEURO: alert & oriented x 3 with fluent speech, no focal motor/sensory deficits  LABORATORY DATA: CBC Latest Ref Rng & Units 11/15/2016 09/11/2016 09/09/2016  WBC 3.9 - 10.3 10e3/uL 6.1 6.1 6.5  Hemoglobin 11.6 - 15.9 g/dL 10.6(L) 9.5(L) 9.7(L)  Hematocrit 34.8 - 46.6 % 32.0(L) 29.3(L) 28.4(L)  Platelets 145 - 400 10e3/uL 311 320 361    CMP Latest Ref Rng & Units 11/15/2016 09/11/2016 09/09/2016  Glucose 70 - 140 mg/dl 115 96 87  BUN 7.0 - 26.0 mg/dL 30.6(H) 35.4(H) 33(H)  Creatinine 0.6 - 1.1 mg/dL 1.7(H) 1.9(H) 1.87(H)  Sodium 136 - 145 mEq/L 139 140 139  Potassium 3.5 - 5.1 mEq/L 3.9 3.4(L) 3.9  Chloride 96 - 106 mmol/L - - 99  CO2 22 - 29 mEq/L '24 26 24  ' Calcium 8.4 - 10.4 mg/dL 9.3 9.6 9.3  Total Protein 6.4 - 8.3 g/dL 6.9 7.2 6.7  Total Bilirubin 0.20 - 1.20 mg/dL 0.62 0.60 0.4  Alkaline Phos 40 - 150 U/L 103 115 112  AST 5 - 34 U/L '26 26 24  ' ALT 0 - 55 U/L '13 13 13    ' Results for NADIRAH, SOCORRO (MRN [id number]) as of 12/18/2015 13:08  Ref. Range 12/29/2014 10:36 06/07/2015 12:39  Iron Latest Ref Range: 42 - 145 ug/dL 77 75  UIBC Latest Ref Range: 120 - 384 ug/dL 180   TIBC Latest Ref Range: 236 - 444 ug/dL 258   %SAT Latest Ref Range: 21 - 57 % 30   Saturation Ratios Latest Ref Range: 20.0 - 50.0 %  23.0  Ferritin Latest Ref Range: 9 - 269 ng/ml 159   Transferrin Latest Ref Range: 212.0 - 360.0 mg/dL  233.0   Studies:  No results found.   RADIOGRAPHIC STUDY No new study   CT Lumbar Spine WO Contrast 01/07/16 IMPRESSION: No acute fractures seen today. There is a subacute, healing fracture at T11 with loss of height of only 10% or less. This was acute in September. Old augmented fracture at  T12. Old un augmented fractures at the other lumbar levels as outlined above.  CT Right Hip WO Contrast 01/07/16 IMPRESSION: No evidence of femoral neck fracture. Questionable nondisplaced right superior pubic ramus fracture, only seen on coronal imaging. Early degenerative changes in the right hip.   ASSESSMENT: [name] [name] 83 y.o. female with a history of Anemia in stage 4 chronic kidney disease ([place])   PLAN:  1. Anemia in chronic renal disease.   -She is clinically doing well, her hemoglobin today is 10.8 today, 05/13/16. She was able to maintain her hemoglobin well with Aranesp every 3 months. However she previously complained about fatigue, and would like to go back to Aranesp injection every 2 months.  - The patient missed one cycle of Aranesp in December 2017 due to hospitalization. -We again reviewed the benefit and the risks of Aranesp injection, especially the risk of thrombosis, including heart attack and stroke. She voiced good understanding.  -Her iron study 05/2015 was completely normal. Will check iron level every year when she is on Aranesp - Labs reviewed, Hb stable at 10.6 today. I will still proceed with Aranesp injection today. I suspect Hg may be artificially high due to dehydration from sever episode of diarrhea.  -I suggest she hold her lasix for 2 days and skip heart medication today to give her time to recover before continuing. I encouraged her to increase her fluid intake the next few days.  -continue lab and injection every 2 months if Hb<11.0 -F/u in 10 months   2. Chronic kidney disease.  --Avoid nephrotoxins and counseled on continued adequate hydration.   - I advised the patient that she could probably discontinue Lasix if she is no longer experiencing lower extremity edema. This could be contributing to her decreased kidney function. I will consult with Dr. Alain Marion on this, and encouraged the patient to discuss this with him as well when she sees him  next.  3. Gout  -I previously encouraged her to follow-up with her primary care physician, and discussed if she would benefit from allopurinol to prevent gout flare -Her physician related her gout to hereditary trait. She watches what she eats as this can effect her gout flaring.    4. CAD, CHF, HTN, ARTHRITIS  -She will continue follow-up with her primary care physician  5. Hypertension -BP today is 134/60 -She will follow with primary care -Encouraged the patient to check blood pressure regularly at home  6. Osteoporosis - We reviewed the patient's history of hip fracture and many compression fractures in the lumbar spine. - The patient is not currently on any medication for osteoporosis, and reports she has never taken anything for this. - I encouraged the patient to discuss this with her PCP and consider Prolia    Follow-up.   -Labs reviewed. Aranesp injection today and every 2 months with labs, If Hb<11.0 - Follow up in 10 months.   Patient had many questions, especially about her gout today. All questions were answered. The patient knows to call the clinic with  any problems, questions or concerns. We can certainly see the patient much sooner if necessary.  I spent 15 minutes counseling the patient face to face. The total time spent in the appointment was 20 minutes.  This document serves as a record of services personally performed by [name], MD. It was created on her behalf by Joslyn Devon, a trained medical scribe. The creation of this record is based on the scribe's personal observations and the provider's statements to them. This document has been checked and approved by the attending provider.    [name]  11/17/2016

## 2016-11-15 NOTE — Patient Instructions (Signed)
Darbepoetin Alfa injection What is this medicine? DARBEPOETIN ALFA (dar be POE e tin AL fa) helps your body make more red blood cells. It is used to treat anemia caused by chronic kidney failure and chemotherapy. This medicine may be used for other purposes; ask your health care provider or pharmacist if you have questions. What should I tell my health care provider before I take this medicine? They need to know if you have any of these conditions: -blood clotting disorders or history of blood clots -cancer patient not on chemotherapy -cystic fibrosis -heart disease, such as angina, heart failure, or a history of a heart attack -hemoglobin level of 12 g/dL or greater -high blood pressure -low levels of folate, iron, or vitamin B12 -seizures -an unusual or allergic reaction to darbepoetin, erythropoietin, albumin, hamster proteins, latex, other medicines, foods, dyes, or preservatives -pregnant or trying to get pregnant -breast-feeding How should I use this medicine? This medicine is for injection into a vein or under the skin. It is usually given by a health care professional in a hospital or clinic setting. If you get this medicine at home, you will be taught how to prepare and give this medicine. Do not shake the solution before you withdraw a dose. Use exactly as directed. Take your medicine at regular intervals. Do not take your medicine more often than directed. It is important that you put your used needles and syringes in a special sharps container. Do not put them in a trash can. If you do not have a sharps container, call your pharmacist or healthcare provider to get one. Talk to your pediatrician regarding the use of this medicine in children. While this medicine may be used in children as young as 1 year for selected conditions, precautions do apply. Overdosage: If you think you have taken too much of this medicine contact a poison control center or emergency room at once. NOTE:  This medicine is only for you. Do not share this medicine with others. What if I miss a dose? If you miss a dose, take it as soon as you can. If it is almost time for your next dose, take only that dose. Do not take double or extra doses. What may interact with this medicine? Do not take this medicine with any of the following medications: -epoetin alfa This list may not describe all possible interactions. Give your health care provider a list of all the medicines, herbs, non-prescription drugs, or dietary supplements you use. Also tell them if you smoke, drink alcohol, or use illegal drugs. Some items may interact with your medicine. What should I watch for while using this medicine? Visit your prescriber or health care professional for regular checks on your progress and for the needed blood tests and blood pressure measurements. It is especially important for the doctor to make sure your hemoglobin level is in the desired range, to limit the risk of potential side effects and to give you the best benefit. Keep all appointments for any recommended tests. Check your blood pressure as directed. Ask your doctor what your blood pressure should be and when you should contact him or her. As your body makes more red blood cells, you may need to take iron, folic acid, or vitamin B supplements. Ask your doctor or health care provider which products are right for you. If you have kidney disease continue dietary restrictions, even though this medication can make you feel better. Talk with your doctor or health care professional about the   foods you eat and the vitamins that you take. What side effects may I notice from receiving this medicine? Side effects that you should report to your doctor or health care professional as soon as possible: -allergic reactions like skin rash, itching or hives, swelling of the face, lips, or tongue -breathing problems -changes in vision -chest pain -confusion, trouble speaking  or understanding -feeling faint or lightheaded, falls -high blood pressure -muscle aches or pains -pain, swelling, warmth in the leg -rapid weight gain -severe headaches -sudden numbness or weakness of the face, arm or leg -trouble walking, dizziness, loss of balance or coordination -seizures (convulsions) -swelling of the ankles, feet, hands -unusually weak or tired Side effects that usually do not require medical attention (report to your doctor or health care professional if they continue or are bothersome): -diarrhea -fever, chills (flu-like symptoms) -headaches -nausea, vomiting -redness, stinging, or swelling at site where injected This list may not describe all possible side effects. Call your doctor for medical advice about side effects. You may report side effects to FDA at 1-800-FDA-1088. Where should I keep my medicine? Keep out of the reach of children. Store in a refrigerator between 2 and 8 degrees C (36 and 46 degrees F). Do not freeze. Do not shake. Throw away any unused portion if using a single-dose vial. Throw away any unused medicine after the expiration date. NOTE: This sheet is a summary. It may not cover all possible information. If you have questions about this medicine, talk to your doctor, pharmacist, or health care provider.    2016, Elsevier/[employer]. (2007-12-22 10:23:57)  

## 2016-11-17 ENCOUNTER — Encounter: Payer: Self-pay | Admitting: Hematology

## 2016-11-20 ENCOUNTER — Encounter: Payer: Self-pay | Admitting: Family Medicine

## 2016-11-20 ENCOUNTER — Ambulatory Visit (INDEPENDENT_AMBULATORY_CARE_PROVIDER_SITE_OTHER): Payer: Medicare Other | Admitting: Family Medicine

## 2016-11-20 VITALS — BP 132/74 | HR 74 | Temp 98.0°F | Ht 63.0 in | Wt 161.0 lb

## 2016-11-20 DIAGNOSIS — M79671 Pain in right foot: Secondary | ICD-10-CM | POA: Diagnosis not present

## 2016-11-20 DIAGNOSIS — M25561 Pain in right knee: Secondary | ICD-10-CM

## 2016-11-20 MED ORDER — PREDNISONE 10 MG PO TABS: ORAL_TABLET | ORAL | 0 refills | Status: DC

## 2016-11-20 NOTE — Assessment & Plan Note (Signed)
She does have degenerative changes of the knee but seems to radiate distally. This could be a component of the arthritic change. Has a history of gout so could be related to that as well. Could be a dynamic process that she has seizures rolling walker and tends to walk leaning forward. Does not appear to be a nerve issue and no fracture - Prednisone. - If no improvement consider injection and aspiration - Counseled on home exercises

## 2016-11-20 NOTE — Assessment & Plan Note (Signed)
Possible that her foot pain could be associated with gout. She does have degenerative changes of midfoot and first MTP joint. - Initiate prednisone - Counseled on supportive footwear. Could consider custom orthotics in the future

## 2016-11-20 NOTE — Patient Instructions (Signed)
Thank you for coming in,   Take tylenol 650 mg three times a day is the best evidence based medicine we have for arthritis.   Glucosamine sulfate 750mg  twice a day is a supplement that has been shown to help moderate to severe arthritis.  Vitamin D 2000 IU daily  Fish oil 2 grams daily.   Tumeric 500mg  twice daily.   Capsaicin topically up to four times a day may also help with pain.  Cortisone injections are an option if these interventions do not seem to make a difference or need more relief.   If cortisone injections do not help, there are different types of shots that may help but they take longer to take effect.  We can discuss this at follow up.  It's important that you continue to stay active.  Consider physical therapy to strengthen muscles around the joint that hurts to take pressure off of the joint itself.  Shoe inserts with good arch support may be helpful.  [employer] orthotics at [employer] could help.   Water aerobics and cycling with low resistance are the best two types of exercise for arthritis.    Please feel free to call with any questions or concerns at any time, at [phone]. --Dr. Raeford Razor

## 2016-11-20 NOTE — Progress Notes (Signed)
[name] [name] - 83 y.o. female MRN [id number]  Date of birth: 22-Oct-1931  SUBJECTIVE:  Including CC & ROS.  Chief Complaint  Patient presents with  . Right foot and leg pain    She states it has been present for 2 weeks. She states the pain is constant.  Pain is located top of her feet and extends to her knee. Denies injury or falls.    [name] [name] is a 83 y.o. female that is presenting with posterior right knee pain and anterior tibial pain, dorsal midfoot pain and dorsal great toe pain. Denies any injury or inciting event. Pain is worse with plantarflexion of her foot. Has to use a rolling walker with ambulation. Has not been taking an anti-inflammatories. Denies any numbness or tingling. Pain is significant in nature. Pain is intermittent. It is sharp and stabbing. It starts at her knee and radiates distally.  Independent review of his CT lumbar spine from 2017 shows subacute healing fracture at T11 and old fracture at T12. L1 and L2 with a retrolisthesis. Different areas of disc bulge with facet hypertrophy  Review of her CT of her right hip from 2017 shows a questionable superior pubic ramus fracture and degenerative changes.   Review of Systems  Musculoskeletal: Positive for gait problem and joint swelling.  Skin: Negative for color change.  Neurological: Negative for numbness.    HISTORY: Past Medical, Surgical, Social, and Family History Reviewed & Updated per EMR.   Pertinent Historical Findings include:  Past Medical History:  Diagnosis Date  . Anemia, unspecified   . Anxiety state, unspecified   . Depressive disorder, not elsewhere classified   . Diverticulosis of colon (without mention of hemorrhage)   . Diverticulosis of colon with hemorrhage   . Esophageal reflux   . History of echocardiogram    Echo 8/18: EF 55-60, normal wall motion, grade 2 diastolic dysfunction, mild AI, mild MR, PASP 32  . Lumbago   . Myocardial infarction ([place])   . Other and unspecified  hyperlipidemia   . Persistent disorder of initiating or maintaining sleep   . Thyroid disease   . Unspecified asthma(493.90)   . Unspecified disorder resulting from impaired renal function   . Unspecified essential hypertension    Dr [name]  . Unspecified osteomyelitis, site unspecified   . Unspecified vitamin D deficiency     Past Surgical History:  Procedure Laterality Date  . BONE MARROW BIOPSY    . CORONARY ARTERY BYPASS GRAFT  03/25/13   x4. CABS ON PUMP-Surgeon Jomarie Longs.  [name] PARTIAL COLECTOMY  1995   diverticulosis of colon iwth hemorrhage.   . status post right carotid enterectomy      Allergies  Allergen Reactions  . Codeine Sulfate Nausea And Vomiting  . Darvon [Propoxyphene] Nausea And Vomiting    hallucinations  . Propoxyphene Napsylate Nausea And Vomiting  . Sulfacetamide Sodium-Sulfur Nausea And Vomiting  . Sulfamethoxazole-Trimethoprim Nausea Only    Can't remember reaction  . Zolpidem     Other reaction(s): Mental Status Changes (intolerance)    Family History  Problem Relation Age of Onset  . Hypertension Other   . COPD Mother   . Cancer Father        lymphoma  . Breast cancer Cousin   . Breast cancer Cousin      Social History   Social History  . Marital status: Divorced    Spouse name: N/A  . Number of children: N/A  . Years of education: N/A  Occupational History  . Not on file.   Social History Main Topics  . Smoking status: Never Smoker  . Smokeless tobacco: Never Used  . Alcohol use 0.0 oz/week    1 - 2 Shots of liquor per week  . Drug use: No  . Sexual activity: No   Other Topics Concern  . Not on file   Social History Narrative   Admitted to skill unit [place] 01/11/16   Divorced   Never smoked   Alcohol 1-2 drinks  Of liquor weekly   POA           PHYSICAL EXAM:  VS: BP 132/74 (BP Location: Left Arm, Patient Position: Sitting, Cuff Size: Normal)   Pulse 74   Temp 98 F (36.7 C) (Oral)   Ht '5\' 3"'  (1.6  m)   Wt 161 lb (73 kg)   SpO2 99%   BMI 28.52 kg/m  Physical Exam Gen: NAD, alert, cooperative with exam, well-appearing ENT: normal lips, normal nasal mucosa,  Eye: normal EOM, normal conjunctiva and lids CV:  no edema, +2 pedal pulses   Resp: no accessory muscle use, non-labored,  GI: no masses or tenderness, no hernia  Skin: no rashes, no areas of induration  Neuro: normal tone, normal sensation to touch Psych:  normal insight, alert and oriented MSK:  Right knee: Tenderness to palpation of the suprapatellar pouch. No tenderness to palpation of the quadrant patellar tendon. No tenderness to palpation over the mediolateral joint line. Normal flexion and extension. Normal strength resistance. Normal and points with valgus and varus testing. No significant tenderness to the anterior portion of the tibia Right ankle/foot: Significant tenderness to palpation of the dorsal midfoot. Limited flexion and extension of the right great toe. No abnormal callus formation. Loss of transverse arch and hammertoe formation. Neurovascularly intact  Limited ultrasound: Right knee, right ankle/foot:  Right knee: Moderate effusion in the suprapatellar pouch Normal quadricep and patellar tendon Joint space narrowing in the medial compartment.  Right ankle/foot: Degenerative changes of the midfoot First MTP joint with no significant effusion but degenerative changes Summary: arthritic changes of the knee with moderate effusion. Foot with degenerative midfoot and 1st MTP joint   Ultrasound and interpretation by Clearance Coots, MD                ASSESSMENT & PLAN:   Right foot pain Possible that her foot pain could be associated with gout. She does have degenerative changes of midfoot and first MTP joint. - Initiate prednisone - Counseled on supportive footwear. Could consider custom orthotics in the future  Acute pain of right knee She does have degenerative changes of  the knee but seems to radiate distally. This could be a component of the arthritic change. Has a history of gout so could be related to that as well. Could be a dynamic process that she has seizures rolling walker and tends to walk leaning forward. Does not appear to be a nerve issue and no fracture - Prednisone. - If no improvement consider injection and aspiration - Counseled on home exercises

## 2016-11-21 ENCOUNTER — Telehealth: Payer: Self-pay | Admitting: Hematology

## 2016-11-21 ENCOUNTER — Other Ambulatory Visit: Payer: Self-pay

## 2016-11-21 MED ORDER — ESCITALOPRAM OXALATE 5 MG PO TABS: 5.0000 mg | ORAL_TABLET | Freq: Every day | ORAL | 1 refills | Status: DC

## 2016-11-21 NOTE — Telephone Encounter (Signed)
Scheduled appt per 10/28 sch message - letter sent in the mail - next appt in Dec.

## 2016-11-25 ENCOUNTER — Ambulatory Visit: Payer: Self-pay

## 2016-11-25 ENCOUNTER — Ambulatory Visit: Payer: Self-pay | Admitting: Hematology

## 2016-11-25 ENCOUNTER — Other Ambulatory Visit: Payer: Self-pay

## 2016-12-24 ENCOUNTER — Encounter: Payer: Self-pay | Admitting: Internal Medicine

## 2016-12-24 ENCOUNTER — Ambulatory Visit (INDEPENDENT_AMBULATORY_CARE_PROVIDER_SITE_OTHER)
Admission: RE | Admit: 2016-12-24 | Discharge: 2016-12-24 | Disposition: A | Discharge status: Home / Self Care | Payer: Medicare Other | Source: Ambulatory Visit | Attending: Internal Medicine | Admitting: Internal Medicine

## 2016-12-24 ENCOUNTER — Other Ambulatory Visit: Payer: Medicare Other

## 2016-12-24 ENCOUNTER — Ambulatory Visit (INDEPENDENT_AMBULATORY_CARE_PROVIDER_SITE_OTHER): Payer: Medicare Other | Admitting: Internal Medicine

## 2016-12-24 VITALS — BP 150/76 | HR 76 | Temp 98.7°F | Resp 20 | Ht 63.0 in | Wt 162.0 lb

## 2016-12-24 DIAGNOSIS — I6523 Occlusion and stenosis of bilateral carotid arteries: Secondary | ICD-10-CM | POA: Diagnosis not present

## 2016-12-24 DIAGNOSIS — J4520 Mild intermittent asthma, uncomplicated: Secondary | ICD-10-CM

## 2016-12-24 DIAGNOSIS — Z0000 Encounter for general adult medical examination without abnormal findings: Secondary | ICD-10-CM | POA: Diagnosis not present

## 2016-12-24 DIAGNOSIS — E2839 Other primary ovarian failure: Secondary | ICD-10-CM | POA: Diagnosis not present

## 2016-12-24 DIAGNOSIS — J069 Acute upper respiratory infection, unspecified: Secondary | ICD-10-CM

## 2016-12-24 DIAGNOSIS — I5020 Unspecified systolic (congestive) heart failure: Secondary | ICD-10-CM

## 2016-12-24 DIAGNOSIS — R05 Cough: Secondary | ICD-10-CM | POA: Diagnosis not present

## 2016-12-24 DIAGNOSIS — E785 Hyperlipidemia, unspecified: Secondary | ICD-10-CM

## 2016-12-24 DIAGNOSIS — I10 Essential (primary) hypertension: Secondary | ICD-10-CM

## 2016-12-24 MED ORDER — BUDESONIDE-FORMOTEROL FUMARATE 160-4.5 MCG/ACT IN AERO: 2.0000 | INHALATION_SPRAY | Freq: Two times a day (BID) | RESPIRATORY_TRACT | 3 refills | Status: AC

## 2016-12-24 MED ORDER — CEFDINIR 300 MG PO CAPS: 300.0000 mg | ORAL_CAPSULE | Freq: Two times a day (BID) | ORAL | 0 refills | Status: DC

## 2016-12-24 MED ORDER — METHYLPREDNISOLONE ACETATE 80 MG/ML IJ SUSP
80.0000 mg | Freq: Once | INTRAMUSCULAR | Status: AC
Start: 2016-12-24 — End: 2016-12-24
  Administered 2016-12-24: 80 mg via INTRAMUSCULAR

## 2016-12-24 MED ORDER — BENZONATATE 200 MG PO CAPS: 200.0000 mg | ORAL_CAPSULE | Freq: Three times a day (TID) | ORAL | 1 refills | Status: DC | PRN

## 2016-12-24 NOTE — Progress Notes (Signed)
Subjective:  Patient ID: [name] [name], female    DOB: January 28, 1931  Age: 83 y.o. MRN: [id number]  CC: Medicare Wellness   HPI [name] [name] presents for URI sx's x 1 week F/u HTN, CAD, OA   Outpatient Medications Prior to Visit  Medication Sig Dispense Refill  . acetaminophen (TYLENOL) 325 MG tablet Take 2 tablets (650 mg total) by mouth every 6 (six) hours as needed for mild pain (or Fever >/= 101). 30 tablet 0  . albuterol (PROVENTIL HFA;VENTOLIN HFA) 108 (90 Base) MCG/ACT inhaler Inhale 1-2 puffs into the lungs every 6 (six) hours as needed for wheezing or shortness of breath. 1 Inhaler 1  . allopurinol (ZYLOPRIM) 100 MG tablet Take 1 tablet (100 mg total) by mouth daily. 90 tablet 3  . aspirin EC 81 MG tablet Take 1 tablet (81 mg total) by mouth daily. 90 tablet 3  . atorvastatin (LIPITOR) 40 MG tablet TAKE 1 TABLET DAILY AT 6PM 90 tablet 0  . b complex vitamins capsule Take 1 capsule by mouth daily.    . Biotin 5000 MCG CAPS Take by mouth 1 day or 1 dose.    . Cholecalciferol (EQL VITAMIN D3) 1000 UNITS tablet Take 1 tablet (1,000 Units total) by mouth daily. 100 tablet 3  . darbepoetin (ARANESP) 100 MCG/0.5ML SOLN injection Inject 100 mcg into the skin every 8 (eight) weeks.    [name] escitalopram (LEXAPRO) 5 MG tablet Take 1 tablet (5 mg total) by mouth daily. 90 tablet 1  . furosemide (LASIX) 20 MG tablet TAKE 1 TABLET BY MOUTH EVERY DAY 30 tablet 10  . levothyroxine (SYNTHROID, LEVOTHROID) 50 MCG tablet TAKE 1 TABLET BY MOUTH EVERY DAY BEFORE BREAKFAST 90 tablet 1  . metoprolol tartrate (LOPRESSOR) 25 MG tablet TAKE 1/2 TABLET BY MOUTH TWICE A DAY 180 tablet 3  . pantoprazole (PROTONIX) 40 MG tablet Take 1 tablet (40 mg total) by mouth daily as needed. 90 tablet 2  . triamcinolone cream (KENALOG) 0.1 % Apply 1 application topically 3 (three) times daily. 30 g 0  . predniSONE (DELTASONE) 10 MG tablet TAKE 3 TABS ONCE DAILY FOR 3 DAYS, 2 FOR 3 DAYS, 1 FOR 2 DAYS THEN 1/2 FOR 2 DAYS  (Patient not taking: Reported on 12/24/2016) 18 tablet 0   Facility-Administered Medications Prior to Visit  Medication Dose Route Frequency Provider Last Rate Last Dose  . Darbepoetin Alfa (ARANESP) injection 200 mcg  200 mcg Subcutaneous Once [name], MD      . Darbepoetin Alfa Kyra Searles) injection 500 mcg  500 mcg Subcutaneous Once [name], MD        ROS Review of Systems  Constitutional: Positive for chills and fatigue. Negative for activity change, appetite change and unexpected weight change.  HENT: Positive for congestion, postnasal drip and sinus pain. Negative for mouth sores and sinus pressure.   Eyes: Negative for visual disturbance.  Respiratory: Positive for cough. Negative for chest tightness and shortness of breath.   Cardiovascular: Negative for chest pain.  Gastrointestinal: Negative for abdominal pain and nausea.  Genitourinary: Negative for difficulty urinating, frequency and vaginal pain.  Musculoskeletal: Negative for back pain and gait problem.  Skin: Negative for pallor and rash.  Neurological: Negative for dizziness, tremors, weakness, numbness and headaches.  Psychiatric/Behavioral: Positive for sleep disturbance. Negative for confusion.    Objective:  BP (!) 150/76   Pulse 76   Temp 98.7 F (37.1 C)   Resp 20   Ht 5\' 3"  (1.6  m)   Wt 162 lb (73.5 kg)   SpO2 98%   BMI 28.70 kg/m   BP Readings from Last 3 Encounters:  12/24/16 (!) 150/76  11/20/16 132/74  11/15/16 (!) 153/66    Wt Readings from Last 3 Encounters:  12/24/16 162 lb (73.5 kg)  11/20/16 161 lb (73 kg)  11/15/16 162 lb 9.6 oz (73.8 kg)    Physical Exam  Constitutional: She appears well-developed. No distress.  HENT:  Head: Normocephalic.  Right Ear: External ear normal.  Left Ear: External ear normal.  Nose: Nose normal.  Mouth/Throat: Oropharynx is clear and moist. No oropharyngeal exudate.  Eyes: Conjunctivae are normal. Pupils are equal, round, and reactive to light. Right  eye exhibits no discharge. Left eye exhibits no discharge.  Neck: Normal range of motion. Neck supple. No JVD present. No tracheal deviation present. No thyromegaly present.  Cardiovascular: Normal rate, regular rhythm and normal heart sounds.  Pulmonary/Chest: No stridor. No respiratory distress. She has wheezes.  Abdominal: Soft. Bowel sounds are normal. She exhibits no distension and no mass. There is no tenderness. There is no rebound and no guarding.  Musculoskeletal: She exhibits tenderness. She exhibits no edema.  Lymphadenopathy:    She has no cervical adenopathy.  Neurological: She displays normal reflexes. No cranial nerve deficit. She exhibits normal muscle tone. Coordination abnormal.  Skin: No rash noted. No erythema.  Psychiatric: She has a normal mood and affect. Her behavior is normal. Judgment and thought content normal.    Lab Results  Component Value Date   WBC 6.1 11/15/2016   HGB 10.6 (L) 11/15/2016   HCT 32.0 (L) 11/15/2016   PLT 311 11/15/2016   GLUCOSE 115 11/15/2016   CHOL 154 06/07/2015   TRIG 96.0 06/07/2015   HDL 47.60 06/07/2015   LDLCALC 87 06/07/2015   ALT 13 11/15/2016   AST 26 11/15/2016   NA 139 11/15/2016   K 3.9 11/15/2016   CL 99 09/09/2016   CREATININE 1.7 (H) 11/15/2016   BUN 30.6 (H) 11/15/2016   CO2 24 11/15/2016   TSH 2.340 09/09/2016   INR 0.98 05/03/2014    No results found.  Assessment & Plan:   Otto was seen today for medicare wellness.  Diagnoses and all orders for this visit:  Encounter for Medicare annual wellness exam  Estrogen deficiency -     DEXAScan; Future   I have discontinued Arville Lime. Escalera's predniSONE. I am also having her maintain her b complex vitamins, darbepoetin, Cholecalciferol, albuterol, aspirin EC, acetaminophen, triamcinolone cream, allopurinol, Biotin, pantoprazole, levothyroxine, furosemide, metoprolol tartrate, atorvastatin, and escitalopram.  No orders of the defined types were placed in  this encounter.    Follow-up: No Follow-up on file.  [name], MD

## 2016-12-24 NOTE — Assessment & Plan Note (Signed)
CXR Tessalon Cefdinir x 10 d

## 2016-12-24 NOTE — Progress Notes (Signed)
Subjective:   [name] [name] is a 83 y.o. female who presents for Medicare Annual (Subsequent) preventive examination.  Review of Systems:  No ROS.  Medicare Wellness Visit. Additional risk factors are reflected in the social history.  Cardiac Risk Factors include: advanced age (>80mn, >>33women);dyslipidemia;hypertension;obesity (BMI >30kg/m2);sedentary lifestyle Sleep patterns: gets up 1-2 times nightly to void and sleeps 6-7 hours nightly.    Home Safety/Smoke Alarms: Feels safe in home. Smoke alarms in place.  Living environment; residence and Firearm Safety: 2-story house, equipment: Walkers, Type: RConservation officer, nature no firearms. Lives alone, no needs for DME, good support system Seat Belt Safety/Bike Helmet: Wears seat belt.      Objective:     Vitals: BP (!) 150/76   Pulse 76   Temp 98.7 F (37.1 C)   Resp 20   Ht 5' 3" (1.6 m)   Wt 162 lb (73.5 kg)   SpO2 98%   BMI 28.70 kg/m   Body mass index is 28.7 kg/m.  Advanced Directives 12/24/2016 05/13/2016 01/16/2016 01/12/2016 01/07/2016 01/07/2016 10/21/2015  Does Patient Have a Medical Advance Directive? _0  No Yes  Type of [place]amed[place]of [place] will - [place] will - [place] will  Does patient want to make changes to medical advance directive? - - - No - Patient declined No - Patient declined - -  Copy of [place]? No - copy requested Yes Yes Yes No - copy requested - -  Would patient like information on creating a medical advance directive? - - - No - Patient declined No - Patient declined No - Patient declined -  Pre-existing out of facility DNR order (yellow form or pink MOST form) - - - - - - -    Tobacco Social History   Tobacco Use  Smoking Status Never Smoker  Smokeless Tobacco Never Used     Counseling given: Not Answered   Past Medical History:   Diagnosis Date  . Anemia, unspecified   . Anxiety state, unspecified   . Depressive disorder, not elsewhere classified   . Diverticulosis of colon (without mention of hemorrhage)   . Diverticulosis of colon with hemorrhage   . Esophageal reflux   . History of echocardiogram    Echo 8/18: EF 55-60, normal wall motion, grade 2 diastolic dysfunction, mild AI, mild MR, PASP 32  . Lumbago   . Myocardial infarction ([place]   . Other and unspecified hyperlipidemia   . Persistent disorder of initiating or maintaining sleep   . Thyroid disease   . Unspecified asthma(493.90)   . Unspecified disorder resulting from impaired renal function   . Unspecified essential hypertension    Dr NJohnsie Cancel . Unspecified osteomyelitis, site unspecified   . Unspecified vitamin D deficiency    Past Surgical History:  Procedure Laterality Date  . BONE MARROW BIOPSY    . CORONARY ARTERY BYPASS GRAFT  03/25/13   x4. CABS ON PUMP-Surgeon NJomarie Longs  .Marland KitchenPARTIAL COLECTOMY  1995   diverticulosis of colon iwth hemorrhage.   . status post right carotid enterectomy     Family History  Problem Relation Age of Onset  . Hypertension Other   . COPD Mother   . Cancer Father        lymphoma  . Breast cancer Cousin   . Breast cancer Cousin    Social History   Socioeconomic History  .  Marital status: Divorced    Spouse name: None  . Number of children: None  . Years of education: None  . Highest education level: None  Social Needs  . Financial resource strain: Not hard at all  . Food insecurity - worry: Never true  . Food insecurity - inability: Never true  . Transportation needs - medical: No  . Transportation needs - non-medical: No  Occupational History  . None  Tobacco Use  . Smoking status: Never Smoker  . Smokeless tobacco: Never Used  Substance and Sexual Activity  . Alcohol use: Yes    Alcohol/week: 0.0 oz    Types: 1 - 2 Shots of liquor per week  . Drug use: No  . Sexual activity: No  Other  Topics Concern  . None  Social History Narrative   Admitted to skill unit [place] 01/11/16   Divorced   Never smoked   Alcohol 1-2 drinks  Of liquor weekly   POA       Outpatient Encounter Medications as of 12/24/2016  Medication Sig  . acetaminophen (TYLENOL) 325 MG tablet Take 2 tablets (650 mg total) by mouth every 6 (six) hours as needed for mild pain (or Fever >/= 101).  [name] albuterol (PROVENTIL HFA;VENTOLIN HFA) 108 (90 Base) MCG/ACT inhaler Inhale 1-2 puffs into the lungs every 6 (six) hours as needed for wheezing or shortness of breath.  . allopurinol (ZYLOPRIM) 100 MG tablet Take 1 tablet (100 mg total) by mouth daily.  [name] aspirin EC 81 MG tablet Take 1 tablet (81 mg total) by mouth daily.  [name] atorvastatin (LIPITOR) 40 MG tablet TAKE 1 TABLET DAILY AT 6PM  . b complex vitamins capsule Take 1 capsule by mouth daily.  . Biotin 5000 MCG CAPS Take by mouth 1 day or 1 dose.  . Cholecalciferol (EQL VITAMIN D3) 1000 UNITS tablet Take 1 tablet (1,000 Units total) by mouth daily.  . darbepoetin (ARANESP) 100 MCG/0.5ML SOLN injection Inject 100 mcg into the skin every 8 (eight) weeks.  [name] escitalopram (LEXAPRO) 5 MG tablet Take 1 tablet (5 mg total) by mouth daily.  . furosemide (LASIX) 20 MG tablet TAKE 1 TABLET BY MOUTH EVERY DAY  . levothyroxine (SYNTHROID, LEVOTHROID) 50 MCG tablet TAKE 1 TABLET BY MOUTH EVERY DAY BEFORE BREAKFAST  . metoprolol tartrate (LOPRESSOR) 25 MG tablet TAKE 1/2 TABLET BY MOUTH TWICE A DAY  . pantoprazole (PROTONIX) 40 MG tablet Take 1 tablet (40 mg total) by mouth daily as needed.  . triamcinolone cream (KENALOG) 0.1 % Apply 1 application topically 3 (three) times daily.  . benzonatate (TESSALON) 200 MG capsule Take 1 capsule (200 mg total) by mouth 3 (three) times daily as needed for cough.  . budesonide-formoterol (SYMBICORT) 160-4.5 MCG/ACT inhaler Inhale 2 puffs into the lungs 2 (two) times daily.  . cefdinir (OMNICEF) 300 MG capsule Take 1 capsule  (300 mg total) by mouth 2 (two) times daily.  . [DISCONTINUED] predniSONE (DELTASONE) 10 MG tablet TAKE 3 TABS ONCE DAILY FOR 3 DAYS, 2 FOR 3 DAYS, 1 FOR 2 DAYS THEN 1/2 FOR 2 DAYS (Patient not taking: Reported on 12/24/2016)   Facility-Administered Encounter Medications as of 12/24/2016  Medication  . Darbepoetin Alfa (ARANESP) injection 200 mcg  . Darbepoetin Alfa (ARANESP) injection 500 mcg  . [COMPLETED] methylPREDNISolone acetate (DEPO-MEDROL) injection 80 mg    Activities of Daily Living In your present state of health, do you have any difficulty performing the following activities: 12/24/2016 01/07/2016  Hearing?  N N  Vision? N N  Difficulty concentrating or making decisions? N N  Walking or climbing stairs? Y N  Dressing or bathing? N N  Doing errands, shopping? N N  Preparing Food and eating ? N -  Using the Toilet? N -  In the past six months, have you accidently leaked urine? N -  Do you have problems with loss of bowel control? N -  Managing your Medications? N -  Managing your Finances? N -  Housekeeping or managing your Housekeeping? N -  Some recent data might be hidden   Patient Care Team: Plotnikov, [name], MD as PCP - General (Internal Medicine) Josue Hector, MD as Consulting Physician (Cardiology) Nada Libman, FNP (Family Medicine)    Assessment:    Physical assessment deferred to PCP.  Exercise Activities and Dietary recommendations Current Exercise Habits: The patient does not participate in regular exercise at present, Exercise limited by: orthopedic condition(s)  Diet (meal preparation, eat out, water intake, caffeinated beverages, dairy products, fruits and vegetables): in general, a "healthy" diet  , well balanced    Goals    . Patient Stated     Stay as healthy and as independent as possible      Fall Risk Fall Risk  12/24/2016 09/24/2016 06/07/2015 03/20/2015 12/19/2014  Falls in the past year? Yes Yes No No No  Number falls in past yr: 1  2 or more - - -  Injury with Fall? Yes Yes - - -  Risk Factor Category  - - - - -  Follow up Falls prevention discussed - - - -   Depression Screen PHQ 2/9 Scores 12/24/2016 09/24/2016 06/07/2015 12/19/2014  PHQ - 2 Score 2 3 0 0  PHQ- 9 Score 4 7 - -     Cognitive Function MMSE - Mini Mental State Exam 12/24/2016  Orientation to time 5  Orientation to Place 5  Registration 3  Attention/ Calculation 4  Recall 1  Language- name 2 objects 2  Language- repeat 1  Language- follow 3 step command 3  Language- read & follow direction 1  Write a sentence 1  Copy design 1  Total score 27        Immunization History  Administered Date(s) Administered  . H1N1 12/28/2007  . Influenza Split 10/22/2011  . Influenza, High Dose Seasonal PF 10/09/2015, 09/24/2016  . Influenza,inj,Quad PF,6+ Mos 10/01/2013, 09/14/2014  . Influenza-Unspecified 11/04/2012  . Pneumococcal Conjugate-13 12/19/2014  . Pneumococcal Polysaccharide-23 07/01/2012   Screening Tests Health Maintenance  Topic Date Due  . TETANUS/TDAP  04/09/1950  . DEXA SCAN  04/10/1998  . INFLUENZA VACCINE  Completed  . PNA vac Low Risk Adult  Completed      Plan:    Continue doing brain stimulating activities (puzzles, reading, adult coloring books, staying active) to keep memory sharp.   Continue to eat heart healthy diet (full of fruits, vegetables, whole grains, lean protein, water--limit salt, fat, and sugar intake) and increase physical activity as tolerated.  I have personally reviewed and noted the following in the patient's chart:   . Medical and social history . Use of alcohol, tobacco or illicit drugs  . Current medications and supplements . Functional ability and status . Nutritional status . Physical activity . Advanced directives . List of other physicians . Vitals . Screenings to include cognitive, depression, and falls . Referrals and appointments  In addition, I have reviewed and discussed with  patient certain preventive protocols, quality metrics,  and best practice recommendations. A written personalized care plan for preventive services as well as general preventive health recommendations were provided to patient.     Michiel Cowboy, RN  12/24/2016   Medical screening examination/treatment/procedure(s) were performed by non-physician practitioner and as supervising physician I was immediately available for consultation/collaboration. I agree with above. Lew Dawes, MD

## 2016-12-24 NOTE — Assessment & Plan Note (Signed)
Worse due to URI Symbicort added Depomedrol 80 mg CXR Tessalon Cefdinir x 10 d

## 2016-12-24 NOTE — Patient Instructions (Addendum)
Continue doing brain stimulating activities (puzzles, reading, adult coloring books, staying active) to keep memory sharp.   Continue to eat heart healthy diet (full of fruits, vegetables, whole grains, lean protein, water--limit salt, fat, and sugar intake) and increase physical activity as tolerated.    [name] [name] , Thank you for taking time to come for your Medicare Wellness Visit. I appreciate your ongoing commitment to your health goals. Please review the following plan we discussed and let me know if I can assist you in the future.   These are the goals we discussed: Goals    . Patient Stated     Stay as healthy and as independent as possible       This is a list of the screening recommended for you and due dates:  Health Maintenance  Topic Date Due  . Tetanus Vaccine  04/09/1950  . DEXA scan (bone density measurement)  04/10/1998  . Flu Shot  Completed  . Pneumonia vaccines  Completed   Please schedule bone density scan as you leave today.

## 2016-12-25 ENCOUNTER — Ambulatory Visit (INDEPENDENT_AMBULATORY_CARE_PROVIDER_SITE_OTHER)
Admission: RE | Admit: 2016-12-25 | Discharge: 2016-12-25 | Disposition: A | Discharge status: Home / Self Care | Payer: Medicare Other | Source: Ambulatory Visit | Attending: Internal Medicine | Admitting: Internal Medicine

## 2016-12-25 DIAGNOSIS — E2839 Other primary ovarian failure: Secondary | ICD-10-CM

## 2017-01-17 ENCOUNTER — Ambulatory Visit (HOSPITAL_BASED_OUTPATIENT_CLINIC_OR_DEPARTMENT_OTHER): Payer: Medicare Other

## 2017-01-17 ENCOUNTER — Other Ambulatory Visit (HOSPITAL_BASED_OUTPATIENT_CLINIC_OR_DEPARTMENT_OTHER): Payer: Medicare Other

## 2017-01-17 VITALS — BP 148/57 | HR 66 | Temp 98.4°F | Resp 18

## 2017-01-17 DIAGNOSIS — N189 Chronic kidney disease, unspecified: Principal | ICD-10-CM

## 2017-01-17 DIAGNOSIS — N184 Chronic kidney disease, stage 4 (severe): Secondary | ICD-10-CM

## 2017-01-17 DIAGNOSIS — D631 Anemia in chronic kidney disease: Secondary | ICD-10-CM

## 2017-01-17 LAB — CBC WITH DIFFERENTIAL/PLATELET
BASO%: 0.6 % (ref 0.0–2.0)
BASOS ABS: 0 10*3/uL (ref 0.0–0.1)
EOS ABS: 0.3 10*3/uL (ref 0.0–0.5)
EOS%: 3.9 % (ref 0.0–7.0)
HCT: 32.5 % — ABNORMAL LOW (ref 34.8–46.6)
HGB: 10.5 g/dL — ABNORMAL LOW (ref 11.6–15.9)
LYMPH%: 25.8 % (ref 14.0–49.7)
MCH: 29.6 pg (ref 25.1–34.0)
MCHC: 32.3 g/dL (ref 31.5–36.0)
MCV: 91.5 fL (ref 79.5–101.0)
MONO#: 0.6 10*3/uL (ref 0.1–0.9)
MONO%: 8.9 % (ref 0.0–14.0)
NEUT%: 60.8 % (ref 38.4–76.8)
NEUTROS ABS: 4 10*3/uL (ref 1.5–6.5)
Platelets: 340 10*3/uL (ref 145–400)
RBC: 3.55 10*6/uL — AB (ref 3.70–5.45)
RDW: 14.5 % (ref 11.2–14.5)
WBC: 6.6 10*3/uL (ref 3.9–10.3)
lymph#: 1.7 10*3/uL (ref 0.9–3.3)

## 2017-01-17 LAB — COMPREHENSIVE METABOLIC PANEL
ALT: 15 U/L (ref 0–55)
AST: 27 U/L (ref 5–34)
Albumin: 3.5 g/dL (ref 3.5–5.0)
Alkaline Phosphatase: 107 U/L (ref 40–150)
Anion Gap: 8 mEq/L (ref 3–11)
BUN: 35.5 mg/dL — AB (ref 7.0–26.0)
CALCIUM: 9.3 mg/dL (ref 8.4–10.4)
CHLORIDE: 105 meq/L (ref 98–109)
CO2: 26 mEq/L (ref 22–29)
CREATININE: 1.9 mg/dL — AB (ref 0.6–1.1)
EGFR: 24 mL/min/{1.73_m2} — ABNORMAL LOW (ref >60)
Glucose: 90 mg/dl (ref 70–140)
Potassium: 4.1 mEq/L (ref 3.5–5.1)
Sodium: 139 mEq/L (ref 136–145)
Total Bilirubin: 0.55 mg/dL (ref 0.20–1.20)
Total Protein: 6.7 g/dL (ref 6.4–8.3)

## 2017-01-17 MED ORDER — DARBEPOETIN ALFA 200 MCG/0.4ML IJ SOSY
200.0000 ug | PREFILLED_SYRINGE | Freq: Once | INTRAMUSCULAR | Status: AC
  Administered 2017-01-17: 200 ug via SUBCUTANEOUS

## 2017-01-17 NOTE — Patient Instructions (Signed)
Darbepoetin Alfa injection What is this medicine? DARBEPOETIN ALFA (dar be POE e tin AL fa) helps your body make more red blood cells. It is used to treat anemia caused by chronic kidney failure and chemotherapy. This medicine may be used for other purposes; ask your health care provider or pharmacist if you have questions. What should I tell my health care provider before I take this medicine? They need to know if you have any of these conditions: -blood clotting disorders or history of blood clots -cancer patient not on chemotherapy -cystic fibrosis -heart disease, such as angina, heart failure, or a history of a heart attack -hemoglobin level of 12 g/dL or greater -high blood pressure -low levels of folate, iron, or vitamin B12 -seizures -an unusual or allergic reaction to darbepoetin, erythropoietin, albumin, hamster proteins, latex, other medicines, foods, dyes, or preservatives -pregnant or trying to get pregnant -breast-feeding How should I use this medicine? This medicine is for injection into a vein or under the skin. It is usually given by a health care professional in a hospital or clinic setting. If you get this medicine at home, you will be taught how to prepare and give this medicine. Do not shake the solution before you withdraw a dose. Use exactly as directed. Take your medicine at regular intervals. Do not take your medicine more often than directed. It is important that you put your used needles and syringes in a special sharps container. Do not put them in a trash can. If you do not have a sharps container, call your pharmacist or healthcare provider to get one. Talk to your pediatrician regarding the use of this medicine in children. While this medicine may be used in children as young as 1 year for selected conditions, precautions do apply. Overdosage: If you think you have taken too much of this medicine contact a poison control center or emergency room at once. NOTE:  This medicine is only for you. Do not share this medicine with others. What if I miss a dose? If you miss a dose, take it as soon as you can. If it is almost time for your next dose, take only that dose. Do not take double or extra doses. What may interact with this medicine? Do not take this medicine with any of the following medications: -epoetin alfa This list may not describe all possible interactions. Give your health care provider a list of all the medicines, herbs, non-prescription drugs, or dietary supplements you use. Also tell them if you smoke, drink alcohol, or use illegal drugs. Some items may interact with your medicine. What should I watch for while using this medicine? Visit your prescriber or health care professional for regular checks on your progress and for the needed blood tests and blood pressure measurements. It is especially important for the doctor to make sure your hemoglobin level is in the desired range, to limit the risk of potential side effects and to give you the best benefit. Keep all appointments for any recommended tests. Check your blood pressure as directed. Ask your doctor what your blood pressure should be and when you should contact him or her. As your body makes more red blood cells, you may need to take iron, folic acid, or vitamin B supplements. Ask your doctor or health care provider which products are right for you. If you have kidney disease continue dietary restrictions, even though this medication can make you feel better. Talk with your doctor or health care professional about the   foods you eat and the vitamins that you take. What side effects may I notice from receiving this medicine? Side effects that you should report to your doctor or health care professional as soon as possible: -allergic reactions like skin rash, itching or hives, swelling of the face, lips, or tongue -breathing problems -changes in vision -chest pain -confusion, trouble speaking  or understanding -feeling faint or lightheaded, falls -high blood pressure -muscle aches or pains -pain, swelling, warmth in the leg -rapid weight gain -severe headaches -sudden numbness or weakness of the face, arm or leg -trouble walking, dizziness, loss of balance or coordination -seizures (convulsions) -swelling of the ankles, feet, hands -unusually weak or tired Side effects that usually do not require medical attention (report to your doctor or health care professional if they continue or are bothersome): -diarrhea -fever, chills (flu-like symptoms) -headaches -nausea, vomiting -redness, stinging, or swelling at site where injected This list may not describe all possible side effects. Call your doctor for medical advice about side effects. You may report side effects to FDA at 1-800-FDA-1088. Where should I keep my medicine? Keep out of the reach of children. Store in a refrigerator between 2 and 8 degrees C (36 and 46 degrees F). Do not freeze. Do not shake. Throw away any unused portion if using a single-dose vial. Throw away any unused medicine after the expiration date. NOTE: This sheet is a summary. It may not cover all possible information. If you have questions about this medicine, talk to your doctor, pharmacist, or health care provider.    2016, Elsevier/[employer]. (2007-12-22 10:23:57)  

## 2017-01-24 ENCOUNTER — Telehealth: Payer: Self-pay | Admitting: Cardiovascular Disease

## 2017-01-24 ENCOUNTER — Other Ambulatory Visit: Payer: Self-pay | Admitting: Internal Medicine

## 2017-01-24 NOTE — Telephone Encounter (Signed)
Patient is enrolled in a balance class at [facility]. Patient states that the instructor is aware that she had a bypass and would like to know if Dr. [name] would send a letter of approval. Patient states that she feels that this class would be very beneficial.   Patient will not be at home from 11-1p.

## 2017-01-24 NOTE — Telephone Encounter (Signed)
Ok to write letter indicating heart status is stable and can participate in balance class

## 2017-01-24 NOTE — Telephone Encounter (Signed)
Called patient back to inform her that a letter would be typed up for her to pick up at our front desk. Patient thanked me for my time.

## 2017-02-01 ENCOUNTER — Other Ambulatory Visit: Payer: Self-pay | Admitting: Internal Medicine

## 2017-02-10 ENCOUNTER — Other Ambulatory Visit: Payer: Self-pay | Admitting: Internal Medicine

## 2017-02-27 ENCOUNTER — Telehealth: Payer: Self-pay

## 2017-02-27 NOTE — Telephone Encounter (Signed)
Advised patient that insurance has been verified to start prolia injections---patient has estimated $0 copay---patient wants to talk with her daughter about it, and call me back ---can talk with Toussaint Golson,RN at [facility] office

## 2017-02-28 ENCOUNTER — Telehealth: Payer: Self-pay

## 2017-02-28 NOTE — Telephone Encounter (Signed)
Patient's daughter, healthcare POA---is just wanting to double check on patient starting Prolia---she is concerned about her kidney function and vitamin D/calcium levels---routing to dr plotnikov, could ;you please double check patient's lab levels and make sure this will not hurt her kidney function and that it is safe for her to start---please advise, I will call patient's daughter Tilda Burrow) back at [phone]

## 2017-03-02 NOTE — Telephone Encounter (Signed)
[name] [name] kidney function is decreased: It is permitted to use Prolia with caution in the setting of renal insufficiency.  Vitamin D level was normal 2 years ago.  Her calcium level was normal.  I am not aware of any safer options to treat osteoporosis in her case.  We have Miacalcin nasal spray which safer, however it is is much less efficient : we can switch if [name] [name] is in agreement.  Thank you

## 2017-03-03 NOTE — Telephone Encounter (Signed)
Advised [name] [name] of dr plotnikovs note/instructions---[name] [name] feels like a nasal spray may be too complicated for patient to understand and use correctly---daughter thinks prolia will be ok, she will make sure mom is reminded to keep regular visits with labs for ongoing kidney function/vitamin d/calcium lab monitoring--I will call patient and schedule nurse visit with her preferably around 11am on any weekday

## 2017-03-17 DIAGNOSIS — Z961 Presence of intraocular lens: Secondary | ICD-10-CM | POA: Diagnosis not present

## 2017-03-20 ENCOUNTER — Inpatient Hospital Stay: Payer: Medicare Other | Attending: Hematology

## 2017-03-20 ENCOUNTER — Inpatient Hospital Stay: Payer: Medicare Other

## 2017-03-20 VITALS — BP 152/80 | HR 69 | Temp 98.2°F | Resp 18

## 2017-03-20 DIAGNOSIS — Z79899 Other long term (current) drug therapy: Secondary | ICD-10-CM | POA: Diagnosis not present

## 2017-03-20 DIAGNOSIS — D631 Anemia in chronic kidney disease: Secondary | ICD-10-CM

## 2017-03-20 DIAGNOSIS — N184 Chronic kidney disease, stage 4 (severe): Secondary | ICD-10-CM

## 2017-03-20 DIAGNOSIS — I129 Hypertensive chronic kidney disease with stage 1 through stage 4 chronic kidney disease, or unspecified chronic kidney disease: Secondary | ICD-10-CM | POA: Insufficient documentation

## 2017-03-20 DIAGNOSIS — N189 Chronic kidney disease, unspecified: Principal | ICD-10-CM

## 2017-03-20 LAB — COMPREHENSIVE METABOLIC PANEL
ALK PHOS: 100 U/L (ref 40–150)
ALT: 16 U/L (ref 0–55)
ANION GAP: 10 (ref 3–11)
AST: 26 U/L (ref 5–34)
Albumin: 3.4 g/dL — ABNORMAL LOW (ref 3.5–5.0)
BILIRUBIN TOTAL: 0.6 mg/dL (ref 0.2–1.2)
BUN: 40 mg/dL — ABNORMAL HIGH (ref 7–26)
CALCIUM: 9.2 mg/dL (ref 8.4–10.4)
CO2: 26 mmol/L (ref 22–29)
CREATININE: 2.01 mg/dL — AB (ref 0.60–1.10)
Chloride: 104 mmol/L (ref 98–109)
GFR calc non Af Amer: 22 mL/min — ABNORMAL LOW (ref >60)
GFR, EST AFRICAN AMERICAN: 25 mL/min — AB (ref >60)
Glucose, Bld: 84 mg/dL (ref 70–140)
Potassium: 3.9 mmol/L (ref 3.5–5.1)
SODIUM: 140 mmol/L (ref 136–145)
TOTAL PROTEIN: 6.6 g/dL (ref 6.4–8.3)

## 2017-03-20 LAB — CBC WITH DIFFERENTIAL/PLATELET
BASOS ABS: 0.1 10*3/uL (ref 0.0–0.1)
BASOS PCT: 1 %
EOS ABS: 0.2 10*3/uL (ref 0.0–0.5)
Eosinophils Relative: 4 %
HCT: 32.6 % — ABNORMAL LOW (ref 34.8–46.6)
Hemoglobin: 10.5 g/dL — ABNORMAL LOW (ref 11.6–15.9)
Lymphocytes Relative: 25 %
Lymphs Abs: 1.5 10*3/uL (ref 0.9–3.3)
MCH: 29.9 pg (ref 25.1–34.0)
MCHC: 32.2 g/dL (ref 31.5–36.0)
MCV: 92.9 fL (ref 79.5–101.0)
MONO ABS: 0.4 10*3/uL (ref 0.1–0.9)
MONOS PCT: 7 %
NEUTROS PCT: 63 %
Neutro Abs: 3.9 10*3/uL (ref 1.5–6.5)
PLATELETS: 282 10*3/uL (ref 145–400)
RBC: 3.51 MIL/uL — ABNORMAL LOW (ref 3.70–5.45)
RDW: 14.3 % (ref 11.2–14.5)
WBC: 6.1 10*3/uL (ref 3.9–10.3)

## 2017-03-20 MED ORDER — DARBEPOETIN ALFA 200 MCG/0.4ML IJ SOSY
200.0000 ug | PREFILLED_SYRINGE | Freq: Once | INTRAMUSCULAR | Status: AC
  Administered 2017-03-20: 200 ug via SUBCUTANEOUS

## 2017-03-20 NOTE — Patient Instructions (Signed)

## 2017-03-24 ENCOUNTER — Ambulatory Visit (INDEPENDENT_AMBULATORY_CARE_PROVIDER_SITE_OTHER): Payer: Medicare Other | Admitting: Internal Medicine

## 2017-03-24 ENCOUNTER — Encounter: Payer: Self-pay | Admitting: Internal Medicine

## 2017-03-24 DIAGNOSIS — E559 Vitamin D deficiency, unspecified: Secondary | ICD-10-CM | POA: Diagnosis not present

## 2017-03-24 DIAGNOSIS — D631 Anemia in chronic kidney disease: Secondary | ICD-10-CM | POA: Diagnosis not present

## 2017-03-24 DIAGNOSIS — F411 Generalized anxiety disorder: Secondary | ICD-10-CM | POA: Diagnosis not present

## 2017-03-24 DIAGNOSIS — I10 Essential (primary) hypertension: Secondary | ICD-10-CM

## 2017-03-24 DIAGNOSIS — E039 Hypothyroidism, unspecified: Secondary | ICD-10-CM | POA: Diagnosis not present

## 2017-03-24 DIAGNOSIS — E785 Hyperlipidemia, unspecified: Secondary | ICD-10-CM | POA: Diagnosis not present

## 2017-03-24 DIAGNOSIS — I480 Paroxysmal atrial fibrillation: Secondary | ICD-10-CM | POA: Diagnosis not present

## 2017-03-24 DIAGNOSIS — M8000XD Age-related osteoporosis with current pathological fracture, unspecified site, subsequent encounter for fracture with routine healing: Secondary | ICD-10-CM | POA: Diagnosis not present

## 2017-03-24 DIAGNOSIS — N183 Chronic kidney disease, stage 3 (moderate): Secondary | ICD-10-CM | POA: Diagnosis not present

## 2017-03-24 DIAGNOSIS — I5020 Unspecified systolic (congestive) heart failure: Secondary | ICD-10-CM

## 2017-03-24 MED ORDER — DENOSUMAB 60 MG/ML ~~LOC~~ SOLN
60.0000 mg | Freq: Once | SUBCUTANEOUS | Status: AC
  Administered 2017-03-24: 60 mg via SUBCUTANEOUS

## 2017-03-24 NOTE — Assessment & Plan Note (Signed)
On Levothroid 

## 2017-03-24 NOTE — Assessment & Plan Note (Signed)
Lorazepam prn Lexapro 

## 2017-03-24 NOTE — Patient Instructions (Signed)
MC well w/Jill 

## 2017-03-24 NOTE — Assessment & Plan Note (Signed)
Lopressor, Furosemide

## 2017-03-24 NOTE — Assessment & Plan Note (Signed)
lipitor

## 2017-03-24 NOTE — Assessment & Plan Note (Signed)
due to CRF- on EPO shots

## 2017-03-24 NOTE — Assessment & Plan Note (Signed)
Furosemide Monitor GFR

## 2017-03-24 NOTE — Assessment & Plan Note (Signed)
Vit D 

## 2017-03-24 NOTE — Progress Notes (Signed)
Subjective:  Patient ID: [name] [name], female    DOB: November 15, 1931  Age: 84 y.o. MRN: [id number]  CC: No chief complaint on file.   HPI [name] [name] presents for CHF, CRF, anemia and OA f/u  Outpatient Medications Prior to Visit  Medication Sig Dispense Refill  . acetaminophen (TYLENOL) 325 MG tablet Take 2 tablets (650 mg total) by mouth every 6 (six) hours as needed for mild pain (or Fever >/= 101). 30 tablet 0  . albuterol (PROVENTIL HFA;VENTOLIN HFA) 108 (90 Base) MCG/ACT inhaler Inhale 1-2 puffs into the lungs every 6 (six) hours as needed for wheezing or shortness of breath. 1 Inhaler 1  . allopurinol (ZYLOPRIM) 100 MG tablet TAKE 1 TABLET BY MOUTH EVERY DAY 90 tablet 1  . aspirin EC 81 MG tablet Take 1 tablet (81 mg total) by mouth daily. 90 tablet 3  . atorvastatin (LIPITOR) 40 MG tablet TAKE 1 TABLET DAILY AT 6PM 90 tablet 1  . b complex vitamins capsule Take 1 capsule by mouth daily.    . Biotin 5000 MCG CAPS Take by mouth 1 day or 1 dose.    . budesonide-formoterol (SYMBICORT) 160-4.5 MCG/ACT inhaler Inhale 2 puffs into the lungs 2 (two) times daily. 1 Inhaler 3  . Cholecalciferol (EQL VITAMIN D3) 1000 UNITS tablet Take 1 tablet (1,000 Units total) by mouth daily. 100 tablet 3  . darbepoetin (ARANESP) 100 MCG/0.5ML SOLN injection Inject 100 mcg into the skin every 8 (eight) weeks.    [name] escitalopram (LEXAPRO) 5 MG tablet Take 1 tablet (5 mg total) by mouth daily. 90 tablet 1  . furosemide (LASIX) 20 MG tablet TAKE 1 TABLET BY MOUTH EVERY DAY 30 tablet 10  . levothyroxine (SYNTHROID, LEVOTHROID) 50 MCG tablet TAKE 1 TABLET BY MOUTH EVERY DAY BEFORE BREAKFAST 90 tablet 3  . metoprolol tartrate (LOPRESSOR) 25 MG tablet TAKE 1/2 TABLET BY MOUTH TWICE A DAY 180 tablet 3  . pantoprazole (PROTONIX) 40 MG tablet Take 1 tablet (40 mg total) by mouth daily as needed. 90 tablet 2  . triamcinolone cream (KENALOG) 0.1 % Apply 1 application topically 3 (three) times daily. 30 g 0  .  benzonatate (TESSALON) 200 MG capsule Take 1 capsule (200 mg total) by mouth 3 (three) times daily as needed for cough. 30 capsule 1  . cefdinir (OMNICEF) 300 MG capsule Take 1 capsule (300 mg total) by mouth 2 (two) times daily. 20 capsule 0   Facility-Administered Medications Prior to Visit  Medication Dose Route Frequency Provider Last Rate Last Dose  . Darbepoetin Alfa (ARANESP) injection 200 mcg  200 mcg Subcutaneous Once [name], MD      . Darbepoetin Alfa Kyra Searles) injection 500 mcg  500 mcg Subcutaneous Once [name], MD        ROS Review of Systems  Constitutional: Positive for fatigue. Negative for activity change, appetite change, chills and unexpected weight change.  HENT: Negative for congestion, mouth sores and sinus pressure.   Eyes: Negative for visual disturbance.  Respiratory: Negative for cough and chest tightness.   Gastrointestinal: Negative for abdominal pain and nausea.  Genitourinary: Negative for difficulty urinating, frequency and vaginal pain.  Musculoskeletal: Positive for arthralgias and back pain. Negative for gait problem.  Skin: Negative for pallor and rash.  Neurological: Negative for dizziness, tremors, weakness, numbness and headaches.  Psychiatric/Behavioral: Negative for confusion and sleep disturbance.    Objective:  BP 132/66 (BP Location: Left Arm, Patient Position: Sitting, Cuff Size: Large)  Pulse 71   Temp 98.5 F (36.9 C) (Oral)   Ht 5\' 3"  (1.6 m)   Wt 165 lb (74.8 kg)   SpO2 99%   BMI 29.23 kg/m   BP Readings from Last 3 Encounters:  03/24/17 132/66  03/20/17 (!) 152/80  01/17/17 (!) 148/57    Wt Readings from Last 3 Encounters:  03/24/17 165 lb (74.8 kg)  12/24/16 162 lb (73.5 kg)  11/20/16 161 lb (73 kg)    Physical Exam  Constitutional: She appears well-developed. No distress.  HENT:  Head: Normocephalic.  Right Ear: External ear normal.  Left Ear: External ear normal.  Nose: Nose normal.  Mouth/Throat:  Oropharynx is clear and moist.  Eyes: Conjunctivae are normal. Pupils are equal, round, and reactive to light. Right eye exhibits no discharge. Left eye exhibits no discharge.  Neck: Normal range of motion. Neck supple. No JVD present. No tracheal deviation present. No thyromegaly present.  Cardiovascular: Normal rate, regular rhythm and normal heart sounds.  Pulmonary/Chest: No stridor. No respiratory distress. She has no wheezes.  Abdominal: Soft. Bowel sounds are normal. She exhibits no distension and no mass. There is no tenderness. There is no rebound and no guarding.  Musculoskeletal: She exhibits tenderness. She exhibits no edema.  Lymphadenopathy:    She has no cervical adenopathy.  Neurological: She displays normal reflexes. No cranial nerve deficit. She exhibits normal muscle tone. Coordination abnormal.  Skin: No rash noted. No erythema.  Psychiatric: She has a normal mood and affect. Her behavior is normal. Judgment and thought content normal.  obese Using a walker  Lab Results  Component Value Date   WBC 6.1 03/20/2017   HGB 10.5 (L) 03/20/2017   HCT 32.6 (L) 03/20/2017   PLT 282 03/20/2017   GLUCOSE 84 03/20/2017   CHOL 154 06/07/2015   TRIG 96.0 06/07/2015   HDL 47.60 06/07/2015   LDLCALC 87 06/07/2015   ALT 16 03/20/2017   AST 26 03/20/2017   NA 140 03/20/2017   K 3.9 03/20/2017   CL 104 03/20/2017   CREATININE 2.01 (H) 03/20/2017   BUN 40 (H) 03/20/2017   CO2 26 03/20/2017   TSH 2.340 09/09/2016   INR 0.98 05/03/2014    Dexascan  Result Date: 12/26/2016 Date of study: 12/25/2016 Exam: DUAL X-RAY ABSORPTIOMETRY (DXA) FOR BONE MINERAL DENSITY (BMD) Instrument: [employer] Provider: PCP Indication: screening for osteoporosis Comparison: none (please note that it is not possible to compare data from different instruments) Clinical data: Pt is a 84 y.o. female with history of multiple fractures. On vitamin D. Results:  Lumbar spine L1-L4 Femoral  neck (FN) T-score +0.4 RFN: -2.6 LFN: -2.1 Assessment: Patient has OSTEOPOROSIS according to the [facility] classification for osteoporosis (see below). Fracture risk: high Comments: the technical quality of the study is good Evaluation for secondary causes should be considered if clinically indicated. Recommend optimizing calcium (1200 mg/day) and vitamin D (800 IU/day). Followup: Repeat BMD is appropriate after 2 years or after 1-2 years if starting treatment. WHO criteria for diagnosis of osteoporosis in postmenopausal women and in men 33 y/o or older: - normal: T-score -1.0 to + 1.0 - osteopenia/low bone density: T-score between -2.5 and -1.0 - osteoporosis: T-score below -2.5 - severe osteoporosis: T-score below -2.5 with history of fragility fracture Note: although not part of the WHO classification, the presence of a fragility fracture, regardless of the T-score, should be considered diagnostic of osteoporosis, provided other causes for the fracture have been excluded. Treatment:  The National Osteoporosis Foundation recommends that treatment be considered in postmenopausal women and men age 87 or older with: 1. Hip or vertebral (clinical or morphometric) fracture 2. T-score of - 2.5 or lower at the spine or hip 3. 10-year fracture probability by FRAX of at least 20% for a major osteoporotic fracture and 3% for a hip fracture Philemon Kingdom, MD [facility]    Assessment & Plan:   There are no diagnoses linked to this encounter. I have discontinued Arville Lime. Sweet's cefdinir and benzonatate. I am also having her maintain her b complex vitamins, darbepoetin, Cholecalciferol, albuterol, aspirin EC, acetaminophen, triamcinolone cream, Biotin, pantoprazole, furosemide, metoprolol tartrate, escitalopram, budesonide-formoterol, levothyroxine, allopurinol, and atorvastatin.  No orders of the defined types were placed in this encounter.    Follow-up: No Follow-up on file.  [name], MD

## 2017-03-24 NOTE — Assessment & Plan Note (Signed)
On ASA, Toprol

## 2017-03-24 NOTE — Addendum Note (Signed)
Addended by: Karren Cobble on: 03/24/2017 02:57 PM   Modules accepted: Orders

## 2017-04-30 ENCOUNTER — Other Ambulatory Visit: Payer: Self-pay | Admitting: Internal Medicine

## 2017-04-30 DIAGNOSIS — Z1231 Encounter for screening mammogram for malignant neoplasm of breast: Secondary | ICD-10-CM

## 2017-05-12 DIAGNOSIS — D485 Neoplasm of uncertain behavior of skin: Secondary | ICD-10-CM | POA: Diagnosis not present

## 2017-05-12 DIAGNOSIS — Z85828 Personal history of other malignant neoplasm of skin: Secondary | ICD-10-CM | POA: Diagnosis not present

## 2017-05-12 DIAGNOSIS — C490 Malignant neoplasm of connective and soft tissue of head, face and neck: Secondary | ICD-10-CM | POA: Diagnosis not present

## 2017-05-12 DIAGNOSIS — L814 Other melanin hyperpigmentation: Secondary | ICD-10-CM | POA: Diagnosis not present

## 2017-05-12 DIAGNOSIS — L570 Actinic keratosis: Secondary | ICD-10-CM | POA: Diagnosis not present

## 2017-05-12 DIAGNOSIS — L821 Other seborrheic keratosis: Secondary | ICD-10-CM | POA: Diagnosis not present

## 2017-05-15 ENCOUNTER — Other Ambulatory Visit: Payer: Self-pay

## 2017-05-15 MED ORDER — ESCITALOPRAM OXALATE 5 MG PO TABS: 5.0000 mg | ORAL_TABLET | Freq: Every day | ORAL | 1 refills | Status: DC

## 2017-05-19 ENCOUNTER — Inpatient Hospital Stay: Payer: Medicare Other

## 2017-05-19 ENCOUNTER — Inpatient Hospital Stay: Payer: Medicare Other | Attending: Hematology

## 2017-05-19 VITALS — BP 158/89 | HR 72 | Temp 97.9°F | Resp 20

## 2017-05-19 DIAGNOSIS — D631 Anemia in chronic kidney disease: Secondary | ICD-10-CM | POA: Insufficient documentation

## 2017-05-19 DIAGNOSIS — N184 Chronic kidney disease, stage 4 (severe): Secondary | ICD-10-CM | POA: Insufficient documentation

## 2017-05-19 DIAGNOSIS — Z79899 Other long term (current) drug therapy: Secondary | ICD-10-CM | POA: Diagnosis not present

## 2017-05-19 DIAGNOSIS — N189 Chronic kidney disease, unspecified: Principal | ICD-10-CM

## 2017-05-19 DIAGNOSIS — I129 Hypertensive chronic kidney disease with stage 1 through stage 4 chronic kidney disease, or unspecified chronic kidney disease: Secondary | ICD-10-CM | POA: Insufficient documentation

## 2017-05-19 LAB — CBC WITH DIFFERENTIAL/PLATELET
Basophils Absolute: 0 10*3/uL (ref 0.0–0.1)
Basophils Relative: 1 %
Eosinophils Absolute: 0.2 10*3/uL (ref 0.0–0.5)
Eosinophils Relative: 3 %
HCT: 31.5 % — ABNORMAL LOW (ref 34.8–46.6)
HEMOGLOBIN: 10.4 g/dL — AB (ref 11.6–15.9)
LYMPHS ABS: 1.3 10*3/uL (ref 0.9–3.3)
LYMPHS PCT: 22 %
MCH: 30.1 pg (ref 25.1–34.0)
MCHC: 33 g/dL (ref 31.5–36.0)
MCV: 91.3 fL (ref 79.5–101.0)
MONO ABS: 0.5 10*3/uL (ref 0.1–0.9)
Monocytes Relative: 9 %
NEUTROS ABS: 3.9 10*3/uL (ref 1.5–6.5)
NEUTROS PCT: 65 %
Platelets: 283 10*3/uL (ref 145–400)
RBC: 3.45 MIL/uL — ABNORMAL LOW (ref 3.70–5.45)
RDW: 14.3 % (ref 11.2–14.5)
WBC: 6 10*3/uL (ref 3.9–10.3)

## 2017-05-19 MED ORDER — DARBEPOETIN ALFA 200 MCG/0.4ML IJ SOSY
200.0000 ug | PREFILLED_SYRINGE | Freq: Once | INTRAMUSCULAR | Status: AC
  Administered 2017-05-19: 200 ug via SUBCUTANEOUS

## 2017-05-19 MED ORDER — DARBEPOETIN ALFA 200 MCG/0.4ML IJ SOSY
PREFILLED_SYRINGE | INTRAMUSCULAR | Status: AC
  Filled 2017-05-19: qty 0.4

## 2017-05-19 NOTE — Patient Instructions (Signed)
Darbepoetin Alfa injection What is this medicine? DARBEPOETIN ALFA (dar be POE e tin AL fa) helps your body make more red blood cells. It is used to treat anemia caused by chronic kidney failure and chemotherapy. This medicine may be used for other purposes; ask your health care provider or pharmacist if you have questions. What should I tell my health care provider before I take this medicine? They need to know if you have any of these conditions: -blood clotting disorders or history of blood clots -cancer patient not on chemotherapy -cystic fibrosis -heart disease, such as angina, heart failure, or a history of a heart attack -hemoglobin level of 12 g/dL or greater -high blood pressure -low levels of folate, iron, or vitamin B12 -seizures -an unusual or allergic reaction to darbepoetin, erythropoietin, albumin, hamster proteins, latex, other medicines, foods, dyes, or preservatives -pregnant or trying to get pregnant -breast-feeding How should I use this medicine? This medicine is for injection into a vein or under the skin. It is usually given by a health care professional in a hospital or clinic setting. If you get this medicine at home, you will be taught how to prepare and give this medicine. Do not shake the solution before you withdraw a dose. Use exactly as directed. Take your medicine at regular intervals. Do not take your medicine more often than directed. It is important that you put your used needles and syringes in a special sharps container. Do not put them in a trash can. If you do not have a sharps container, call your pharmacist or healthcare provider to get one. Talk to your pediatrician regarding the use of this medicine in children. While this medicine may be used in children as young as 1 year for selected conditions, precautions do apply. Overdosage: If you think you have taken too much of this medicine contact a poison control center or emergency room at once. NOTE:  This medicine is only for you. Do not share this medicine with others. What if I miss a dose? If you miss a dose, take it as soon as you can. If it is almost time for your next dose, take only that dose. Do not take double or extra doses. What may interact with this medicine? Do not take this medicine with any of the following medications: -epoetin alfa This list may not describe all possible interactions. Give your health care provider a list of all the medicines, herbs, non-prescription drugs, or dietary supplements you use. Also tell them if you smoke, drink alcohol, or use illegal drugs. Some items may interact with your medicine. What should I watch for while using this medicine? Visit your prescriber or health care professional for regular checks on your progress and for the needed blood tests and blood pressure measurements. It is especially important for the doctor to make sure your hemoglobin level is in the desired range, to limit the risk of potential side effects and to give you the best benefit. Keep all appointments for any recommended tests. Check your blood pressure as directed. Ask your doctor what your blood pressure should be and when you should contact him or her. As your body makes more red blood cells, you may need to take iron, folic acid, or vitamin B supplements. Ask your doctor or health care provider which products are right for you. If you have kidney disease continue dietary restrictions, even though this medication can make you feel better. Talk with your doctor or health care professional about the   foods you eat and the vitamins that you take. What side effects may I notice from receiving this medicine? Side effects that you should report to your doctor or health care professional as soon as possible: -allergic reactions like skin rash, itching or hives, swelling of the face, lips, or tongue -breathing problems -changes in vision -chest pain -confusion, trouble speaking  or understanding -feeling faint or lightheaded, falls -high blood pressure -muscle aches or pains -pain, swelling, warmth in the leg -rapid weight gain -severe headaches -sudden numbness or weakness of the face, arm or leg -trouble walking, dizziness, loss of balance or coordination -seizures (convulsions) -swelling of the ankles, feet, hands -unusually weak or tired Side effects that usually do not require medical attention (report to your doctor or health care professional if they continue or are bothersome): -diarrhea -fever, chills (flu-like symptoms) -headaches -nausea, vomiting -redness, stinging, or swelling at site where injected This list may not describe all possible side effects. Call your doctor for medical advice about side effects. You may report side effects to FDA at 1-800-FDA-1088. Where should I keep my medicine? Keep out of the reach of children. Store in a refrigerator between 2 and 8 degrees C (36 and 46 degrees F). Do not freeze. Do not shake. Throw away any unused portion if using a single-dose vial. Throw away any unused medicine after the expiration date. NOTE: This sheet is a summary. It may not cover all possible information. If you have questions about this medicine, talk to your doctor, pharmacist, or health care provider.    2016, Elsevier/[employer]. (2007-12-22 10:23:57)  

## 2017-05-23 DIAGNOSIS — C490 Malignant neoplasm of connective and soft tissue of head, face and neck: Secondary | ICD-10-CM | POA: Diagnosis not present

## 2017-05-29 DIAGNOSIS — C490 Malignant neoplasm of connective and soft tissue of head, face and neck: Secondary | ICD-10-CM | POA: Diagnosis not present

## 2017-05-29 DIAGNOSIS — C760 Malignant neoplasm of head, face and neck: Secondary | ICD-10-CM | POA: Diagnosis not present

## 2017-06-04 ENCOUNTER — Ambulatory Visit
Admission: RE | Admit: 2017-06-04 | Discharge: 2017-06-04 | Disposition: A | Discharge status: Home / Self Care | Payer: Medicare Other | Source: Ambulatory Visit | Attending: Internal Medicine | Admitting: Internal Medicine

## 2017-06-04 DIAGNOSIS — Z1231 Encounter for screening mammogram for malignant neoplasm of breast: Secondary | ICD-10-CM

## 2017-06-06 DIAGNOSIS — R918 Other nonspecific abnormal finding of lung field: Secondary | ICD-10-CM | POA: Diagnosis not present

## 2017-06-06 DIAGNOSIS — I517 Cardiomegaly: Secondary | ICD-10-CM | POA: Diagnosis not present

## 2017-06-06 DIAGNOSIS — C499 Malignant neoplasm of connective and soft tissue, unspecified: Secondary | ICD-10-CM | POA: Diagnosis not present

## 2017-06-06 DIAGNOSIS — C490 Malignant neoplasm of connective and soft tissue of head, face and neck: Secondary | ICD-10-CM | POA: Diagnosis not present

## 2017-06-06 DIAGNOSIS — R9431 Abnormal electrocardiogram [ECG] [EKG]: Secondary | ICD-10-CM | POA: Diagnosis not present

## 2017-06-06 DIAGNOSIS — L988 Other specified disorders of the skin and subcutaneous tissue: Secondary | ICD-10-CM | POA: Diagnosis not present

## 2017-06-12 DIAGNOSIS — L988 Other specified disorders of the skin and subcutaneous tissue: Secondary | ICD-10-CM | POA: Diagnosis not present

## 2017-06-12 DIAGNOSIS — C490 Malignant neoplasm of connective and soft tissue of head, face and neck: Secondary | ICD-10-CM | POA: Diagnosis not present

## 2017-06-24 DIAGNOSIS — C760 Malignant neoplasm of head, face and neck: Secondary | ICD-10-CM | POA: Diagnosis not present

## 2017-06-25 ENCOUNTER — Ambulatory Visit: Payer: Self-pay | Admitting: Internal Medicine

## 2017-06-27 ENCOUNTER — Encounter: Payer: Self-pay | Admitting: Internal Medicine

## 2017-06-27 ENCOUNTER — Ambulatory Visit (INDEPENDENT_AMBULATORY_CARE_PROVIDER_SITE_OTHER): Payer: Medicare Other | Admitting: Internal Medicine

## 2017-06-27 DIAGNOSIS — N184 Chronic kidney disease, stage 4 (severe): Secondary | ICD-10-CM | POA: Diagnosis not present

## 2017-06-27 DIAGNOSIS — I5020 Unspecified systolic (congestive) heart failure: Secondary | ICD-10-CM | POA: Diagnosis not present

## 2017-06-27 DIAGNOSIS — F411 Generalized anxiety disorder: Secondary | ICD-10-CM | POA: Diagnosis not present

## 2017-06-27 DIAGNOSIS — D631 Anemia in chronic kidney disease: Secondary | ICD-10-CM | POA: Diagnosis not present

## 2017-06-27 DIAGNOSIS — C490 Malignant neoplasm of connective and soft tissue of head, face and neck: Secondary | ICD-10-CM | POA: Insufficient documentation

## 2017-06-27 DIAGNOSIS — I10 Essential (primary) hypertension: Secondary | ICD-10-CM | POA: Diagnosis not present

## 2017-06-27 NOTE — Assessment & Plan Note (Signed)
Labs at [facility]

## 2017-06-27 NOTE — Assessment & Plan Note (Signed)
Lasix

## 2017-06-27 NOTE — Assessment & Plan Note (Signed)
Lorazepam prn; Lexapro  Potential benefits of a long term benzodiazepines  use as well as potential risks  and complications were explained to the patient and were aknowledged.

## 2017-06-27 NOTE — Assessment & Plan Note (Signed)
Lopressor, Furosemide

## 2017-06-27 NOTE — Assessment & Plan Note (Addendum)
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

## 2017-06-27 NOTE — Assessment & Plan Note (Signed)
Per Hematology Aranesp prn

## 2017-06-27 NOTE — Progress Notes (Signed)
Subjective:  Patient ID: [name] [name], female    DOB: [birth year]  Age: 84 y.o. MRN: [id number]  CC: No chief complaint on file.   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 F/u HTN, CAD, CHF  Outpatient Medications Prior to Visit  Medication Sig Dispense Refill  . acetaminophen (TYLENOL) 325 MG tablet Take 2 tablets (650 mg total) by mouth every 6 (six) hours as needed for mild pain (or Fever >/= 101). 30 tablet 0  . albuterol (PROVENTIL HFA;VENTOLIN HFA) 108 (90 Base) MCG/ACT inhaler Inhale 1-2 puffs into the lungs every 6 (six) hours as needed for wheezing or shortness of breath. 1 Inhaler 1  . allopurinol (ZYLOPRIM) 100 MG tablet TAKE 1 TABLET BY MOUTH EVERY DAY 90 tablet 1  . aspirin EC 81 MG tablet Take 1 tablet (81 mg total) by mouth daily. 90 tablet 3  . atorvastatin (LIPITOR) 40 MG tablet TAKE 1 TABLET DAILY AT 6PM 90 tablet 1  . b complex vitamins capsule Take 1 capsule by mouth daily.    . Biotin 5000 MCG CAPS Take by mouth 1 day or 1 dose.    . budesonide-formoterol (SYMBICORT) 160-4.5 MCG/ACT inhaler Inhale 2 puffs into the lungs 2 (two) times daily. 1 Inhaler 3  . Cholecalciferol (EQL VITAMIN D3) 1000 UNITS tablet Take 1 tablet (1,000 Units total) by mouth daily. 100 tablet 3  . darbepoetin (ARANESP) 100 MCG/0.5ML SOLN injection Inject 100 mcg into the skin every 8 (eight) weeks.    [name] escitalopram (LEXAPRO) 5 MG tablet Take 1 tablet (5 mg total) by mouth daily. 90 tablet 1  . furosemide (LASIX) 20 MG tablet TAKE 1 TABLET BY MOUTH EVERY DAY 30 tablet 10  . levothyroxine (SYNTHROID, LEVOTHROID) 50 MCG tablet TAKE 1 TABLET BY MOUTH EVERY DAY BEFORE BREAKFAST 90 tablet 3  . metoprolol tartrate (LOPRESSOR) 25 MG tablet TAKE 1/2 TABLET BY MOUTH TWICE A DAY 180 tablet 3  . pantoprazole (PROTONIX) 40 MG tablet Take 1 tablet (40 mg total) by mouth daily as needed. 90 tablet 2  . triamcinolone cream (KENALOG) 0.1 % Apply 1 application topically  3 (three) times daily. 30 g 0   Facility-Administered Medications Prior to Visit  Medication Dose Route Frequency Provider Last Rate Last Dose  . Darbepoetin Alfa (ARANESP) injection 200 mcg  200 mcg Subcutaneous Once [name], MD      . Darbepoetin Alfa Kyra Searles) injection 500 mcg  500 mcg Subcutaneous Once [name], MD        ROS: Review of Systems  Constitutional: Positive for fatigue. Negative for activity change, appetite change, chills and unexpected weight change.  HENT: Negative for congestion, mouth sores and sinus pressure.   Eyes: Negative for visual disturbance.  Respiratory: Negative for cough and chest tightness.   Gastrointestinal: Negative for abdominal pain and nausea.  Genitourinary: Negative for difficulty urinating, frequency and vaginal pain.  Musculoskeletal: Positive for arthralgias, back pain and gait problem.  Skin: Positive for rash. Negative for pallor.  Neurological: Negative for dizziness, tremors, weakness, numbness and headaches.  Psychiatric/Behavioral: Negative for confusion, sleep disturbance and suicidal ideas. The patient is nervous/anxious.     Objective:  BP 130/68 (BP Location: Left Arm, Patient Position: Sitting, Cuff Size: Normal)   Pulse 69   Temp 98.4 F (36.9 C) (Oral)   Ht 5\' 3"  (1.6 m)   Wt 164 lb (74.4 kg)   SpO2 98%   BMI 29.05 kg/m  BP Readings from Last 3 Encounters:  06/27/17 130/68  05/19/17 (!) 158/89  03/24/17 132/66    Wt Readings from Last 3 Encounters:  06/27/17 164 lb (74.4 kg)  03/24/17 165 lb (74.8 kg)  12/24/16 162 lb (73.5 kg)    Physical Exam  Constitutional: She appears well-developed. No distress.  HENT:  Head: Normocephalic.  Right Ear: External ear normal.  Left Ear: External ear normal.  Nose: Nose normal.  Mouth/Throat: Oropharynx is clear and moist.  Eyes: Pupils are equal, round, and reactive to light. Conjunctivae are normal. Right eye exhibits no discharge. Left eye exhibits no discharge.    Neck: Normal range of motion. Neck supple. No JVD present. No tracheal deviation present. No thyromegaly present.  Cardiovascular: Normal rate, regular rhythm and normal heart sounds.  Pulmonary/Chest: No stridor. No respiratory distress. She has no wheezes.  Abdominal: Soft. Bowel sounds are normal. She exhibits no distension and no mass. There is no tenderness. There is no rebound and no guarding.  Musculoskeletal: She exhibits no edema or tenderness.  Lymphadenopathy:    She has no cervical adenopathy.  Neurological: She displays normal reflexes. No cranial nerve deficit. She exhibits normal muscle tone. Coordination abnormal.  Skin: Rash noted. No erythema.  Psychiatric: She has a normal mood and affect. Her behavior is normal. Judgment and thought content normal.   Walker Scars of forehead   Lab Results  Component Value Date   WBC 6.0 05/19/2017   HGB 10.4 (L) 05/19/2017   HCT 31.5 (L) 05/19/2017   PLT 283 05/19/2017   GLUCOSE 84 03/20/2017   CHOL 154 06/07/2015   TRIG 96.0 06/07/2015   HDL 47.60 06/07/2015   LDLCALC 87 06/07/2015   ALT 16 03/20/2017   AST 26 03/20/2017   NA 140 03/20/2017   K 3.9 03/20/2017   CL 104 03/20/2017   CREATININE 2.01 (H) 03/20/2017   BUN 40 (H) 03/20/2017   CO2 26 03/20/2017   TSH 2.340 09/09/2016   INR 0.98 05/03/2014    Mm 3d Screen Breast Bilateral  Result Date: 06/04/2017 CLINICAL DATA:  Screening. EXAM: DIGITAL SCREENING BILATERAL MAMMOGRAM WITH TOMO AND CAD COMPARISON:  Previous exam(s). ACR Breast Density Category b: There are scattered areas of fibroglandular density. FINDINGS: There are no findings suspicious for malignancy. Images were processed with CAD. IMPRESSION: No mammographic evidence of malignancy. A result letter of this screening mammogram will be mailed directly to the patient. RECOMMENDATION: Screening mammogram in one year. (Code:SM-B-01Y) BI-RADS CATEGORY  1: Negative. Electronically Signed   By: Dorise Bullion III  M.D   On: 06/04/2017 11:46    Assessment & Plan:   There are no diagnoses linked to this encounter.   No orders of the defined types were placed in this encounter.    Follow-up: No follow-ups on file.  [name], MD

## 2017-07-03 ENCOUNTER — Ambulatory Visit: Payer: Self-pay

## 2017-07-03 NOTE — Telephone Encounter (Signed)
Pt c/o rash and itching to waist line, right arm and upper buttocks. Pt stated she was working outside with her  Flowers and thinks she came in contact with poison ivy or poison oak. Rash appears as red bumps with 1 or 2 fluid filled bumps. The rash began yesterday. Pt is having moderate to severe itching at the affected areas. She is using cortisone cream without results. Pt given care advice per protocol. Appt made and accepted for pt tomorrow am with PCP.  Reason for Disposition . SEVERE itching (e.g., interferes with sleep or normal activities)  Answer Assessment - Initial Assessment Questions 1. APPEARANCE of RASH: "Describe the rash."   red and raised 1 lesion looks like blister 2. LOCATION: "Where is the rash located?"      Waist line, right arm and upper buttocks 3. SIZE: "How large is the rash?"      Scattered rash in same area  4. ONSET: "When did the rash begin?"      yesterday 5. ITCHING: "Does the rash itch?" If so, ask: "How bad is it?"   - MILD - doesn't interfere with normal activities   - MODERATE-SEVERE: interferes with work, school, sleep, or other activities      Moderate to severe 6. PREGNANCY: "Is there any chance you are pregnant?" "When was your last menstrual period?"     n/a  Protocols used: POISON IVY - OAK - SUMAC-A-AH

## 2017-07-04 ENCOUNTER — Encounter: Payer: Self-pay | Admitting: Internal Medicine

## 2017-07-04 ENCOUNTER — Ambulatory Visit (INDEPENDENT_AMBULATORY_CARE_PROVIDER_SITE_OTHER): Payer: Medicare Other | Admitting: Internal Medicine

## 2017-07-04 DIAGNOSIS — R21 Rash and other nonspecific skin eruption: Secondary | ICD-10-CM | POA: Diagnosis not present

## 2017-07-04 DIAGNOSIS — C490 Malignant neoplasm of connective and soft tissue of head, face and neck: Secondary | ICD-10-CM

## 2017-07-04 DIAGNOSIS — K5900 Constipation, unspecified: Secondary | ICD-10-CM

## 2017-07-04 MED ORDER — TRIAMCINOLONE ACETONIDE 0.1 % EX CREA: 1.0000 "application " | TOPICAL_CREAM | Freq: Two times a day (BID) | CUTANEOUS | 1 refills | Status: AC

## 2017-07-04 MED ORDER — CETIRIZINE HCL 10 MG PO TABS: 10.0000 mg | ORAL_TABLET | Freq: Every day | ORAL | 11 refills | Status: DC

## 2017-07-04 MED ORDER — METHYLPREDNISOLONE ACETATE 80 MG/ML IJ SUSP
80.0000 mg | Freq: Once | INTRAMUSCULAR | Status: AC
Start: 2017-07-04 — End: 2017-07-04
  Administered 2017-07-04: 80 mg via INTRAMUSCULAR

## 2017-07-04 MED ORDER — POLYETHYLENE GLYCOL 3350 17 GM/SCOOP PO POWD: 17.0000 g | Freq: Two times a day (BID) | ORAL | 3 refills | Status: AC | PRN

## 2017-07-04 NOTE — Assessment & Plan Note (Signed)
[facility] next week

## 2017-07-04 NOTE — Addendum Note (Signed)
Addended by: Karren Cobble on: 07/04/2017 09:04 AM   Modules accepted: Orders

## 2017-07-04 NOTE — Assessment & Plan Note (Signed)
Miralax

## 2017-07-04 NOTE — Patient Instructions (Signed)
Miralax Senakot

## 2017-07-04 NOTE — Progress Notes (Signed)
Subjective:  Patient ID: [name] [name], female    DOB: 14-Dec-1931  Age: 84 y.o. MRN: [id number]  CC: No chief complaint on file.   HPI [name] [name] presents for rash on R arm and back - itching. She worked on her flower beds on Tue, rash on Wed  Outpatient Medications Prior to Visit  Medication Sig Dispense Refill  . acetaminophen (TYLENOL) 325 MG tablet Take 2 tablets (650 mg total) by mouth every 6 (six) hours as needed for mild pain (or Fever >/= 101). 30 tablet 0  . albuterol (PROVENTIL HFA;VENTOLIN HFA) 108 (90 Base) MCG/ACT inhaler Inhale 1-2 puffs into the lungs every 6 (six) hours as needed for wheezing or shortness of breath. 1 Inhaler 1  . allopurinol (ZYLOPRIM) 100 MG tablet TAKE 1 TABLET BY MOUTH EVERY DAY 90 tablet 1  . aspirin EC 81 MG tablet Take 1 tablet (81 mg total) by mouth daily. 90 tablet 3  . atorvastatin (LIPITOR) 40 MG tablet TAKE 1 TABLET DAILY AT 6PM 90 tablet 1  . b complex vitamins capsule Take 1 capsule by mouth daily.    . Biotin 5000 MCG CAPS Take by mouth 1 day or 1 dose.    . budesonide-formoterol (SYMBICORT) 160-4.5 MCG/ACT inhaler Inhale 2 puffs into the lungs 2 (two) times daily. 1 Inhaler 3  . Cholecalciferol (EQL VITAMIN D3) 1000 UNITS tablet Take 1 tablet (1,000 Units total) by mouth daily. 100 tablet 3  . darbepoetin (ARANESP) 100 MCG/0.5ML SOLN injection Inject 100 mcg into the skin every 8 (eight) weeks.    [name] escitalopram (LEXAPRO) 5 MG tablet Take 1 tablet (5 mg total) by mouth daily. 90 tablet 1  . furosemide (LASIX) 20 MG tablet TAKE 1 TABLET BY MOUTH EVERY DAY 30 tablet 10  . levothyroxine (SYNTHROID, LEVOTHROID) 50 MCG tablet TAKE 1 TABLET BY MOUTH EVERY DAY BEFORE BREAKFAST 90 tablet 3  . metoprolol tartrate (LOPRESSOR) 25 MG tablet TAKE 1/2 TABLET BY MOUTH TWICE A DAY 180 tablet 3  . pantoprazole (PROTONIX) 40 MG tablet Take 1 tablet (40 mg total) by mouth daily as needed. 90 tablet 2  . triamcinolone cream (KENALOG) 0.1 % Apply 1  application topically 3 (three) times daily. 30 g 0   Facility-Administered Medications Prior to Visit  Medication Dose Route Frequency Provider Last Rate Last Dose  . Darbepoetin Alfa (ARANESP) injection 200 mcg  200 mcg Subcutaneous Once [name], MD      . Darbepoetin Alfa Kyra Searles) injection 500 mcg  500 mcg Subcutaneous Once [name], MD        ROS: Review of Systems  Constitutional: Negative.  Negative for activity change, appetite change, chills, diaphoresis, fatigue, fever and unexpected weight change.  HENT: Negative for congestion, ear pain, facial swelling, hearing loss, mouth sores, nosebleeds, postnasal drip, rhinorrhea, sinus pressure, sneezing, sore throat, tinnitus and trouble swallowing.   Eyes: Negative for pain, discharge, redness, itching and visual disturbance.  Respiratory: Negative for cough, chest tightness, shortness of breath, wheezing and stridor.   Cardiovascular: Negative for chest pain, palpitations and leg swelling.  Gastrointestinal: Negative for abdominal distention, anal bleeding, blood in stool, constipation, diarrhea, nausea and rectal pain.  Genitourinary: Negative for difficulty urinating, dysuria, flank pain, frequency, genital sores, hematuria, pelvic pain, urgency, vaginal bleeding and vaginal discharge.  Musculoskeletal: Negative for arthralgias, back pain, gait problem, joint swelling, neck pain and neck stiffness.  Skin: Positive for rash.  Neurological: Negative for dizziness, tremors, seizures, syncope, speech difficulty, weakness,  numbness and headaches.  Hematological: Negative for adenopathy. Does not bruise/bleed easily.  Psychiatric/Behavioral: Negative for behavioral problems, decreased concentration, dysphoric mood, sleep disturbance and suicidal ideas. The patient is not nervous/anxious.     Objective:  BP (!) 150/72 (BP Location: Left Arm, Patient Position: Sitting, Cuff Size: Normal)   Pulse 65   Temp 98.2 F (36.8 C) (Oral)   Ht  5\' 3"  (1.6 m)   Wt 165 lb (74.8 kg)   SpO2 98%   BMI 29.23 kg/m   BP Readings from Last 3 Encounters:  07/04/17 (!) 150/72  06/27/17 130/68  05/19/17 (!) 158/89    Wt Readings from Last 3 Encounters:  07/04/17 165 lb (74.8 kg)  06/27/17 164 lb (74.4 kg)  03/24/17 165 lb (74.8 kg)    Physical Exam  Constitutional: She appears well-developed. No distress.  HENT:  Head: Normocephalic.  Right Ear: External ear normal.  Left Ear: External ear normal.  Nose: Nose normal.  Mouth/Throat: Oropharynx is clear and moist.  Eyes: Pupils are equal, round, and reactive to light. Conjunctivae are normal. Right eye exhibits no discharge. Left eye exhibits no discharge.  Neck: Normal range of motion. Neck supple. No JVD present. No tracheal deviation present. No thyromegaly present.  Cardiovascular: Normal rate, regular rhythm and normal heart sounds.  Pulmonary/Chest: No stridor. No respiratory distress. She has no wheezes.  Abdominal: Soft. Bowel sounds are normal. She exhibits no distension and no mass. There is no tenderness. There is no rebound and no guarding.  Musculoskeletal: She exhibits no edema or tenderness.  Lymphadenopathy:    She has no cervical adenopathy.  Neurological: She displays normal reflexes. No cranial nerve deficit. She exhibits normal muscle tone. Coordination normal.  Skin: Rash noted. No erythema.  Psychiatric: She has a normal mood and affect. Her behavior is normal. Judgment and thought content normal.  1-2 2 cm papules on R forearm x2 and 6-7 on B sides of the lower back  Lab Results  Component Value Date   WBC 6.0 05/19/2017   HGB 10.4 (L) 05/19/2017   HCT 31.5 (L) 05/19/2017   PLT 283 05/19/2017   GLUCOSE 84 03/20/2017   CHOL 154 06/07/2015   TRIG 96.0 06/07/2015   HDL 47.60 06/07/2015   LDLCALC 87 06/07/2015   ALT 16 03/20/2017   AST 26 03/20/2017   NA 140 03/20/2017   K 3.9 03/20/2017   CL 104 03/20/2017   CREATININE 2.01 (H) 03/20/2017   BUN  40 (H) 03/20/2017   CO2 26 03/20/2017   TSH 2.340 09/09/2016   INR 0.98 05/03/2014    Mm 3d Screen Breast Bilateral  Result Date: 06/04/2017 CLINICAL DATA:  Screening. EXAM: DIGITAL SCREENING BILATERAL MAMMOGRAM WITH TOMO AND CAD COMPARISON:  Previous exam(s). ACR Breast Density Category b: There are scattered areas of fibroglandular density. FINDINGS: There are no findings suspicious for malignancy. Images were processed with CAD. IMPRESSION: No mammographic evidence of malignancy. A result letter of this screening mammogram will be mailed directly to the patient. RECOMMENDATION: Screening mammogram in one year. (Code:SM-B-01Y) BI-RADS CATEGORY  1: Negative. Electronically Signed   By: Dorise Bullion III M.D   On: 06/04/2017 11:46    Assessment & Plan:   There are no diagnoses linked to this encounter.   No orders of the defined types were placed in this encounter.    Follow-up: No follow-ups on file.  [name], MD

## 2017-07-04 NOTE — Assessment & Plan Note (Signed)
06/2017 ?bug bites Triamc 0.1% cream Zyrtec  Depo-medrol 80 mg IM

## 2017-07-07 DIAGNOSIS — C760 Malignant neoplasm of head, face and neck: Secondary | ICD-10-CM | POA: Diagnosis not present

## 2017-07-09 ENCOUNTER — Ambulatory Visit: Payer: Self-pay | Admitting: Internal Medicine

## 2017-07-09 DIAGNOSIS — C760 Malignant neoplasm of head, face and neck: Secondary | ICD-10-CM | POA: Diagnosis not present

## 2017-07-10 DIAGNOSIS — C760 Malignant neoplasm of head, face and neck: Secondary | ICD-10-CM | POA: Diagnosis not present

## 2017-07-11 DIAGNOSIS — C760 Malignant neoplasm of head, face and neck: Secondary | ICD-10-CM | POA: Diagnosis not present

## 2017-07-14 DIAGNOSIS — C760 Malignant neoplasm of head, face and neck: Secondary | ICD-10-CM | POA: Diagnosis not present

## 2017-07-14 DIAGNOSIS — C499 Malignant neoplasm of connective and soft tissue, unspecified: Secondary | ICD-10-CM | POA: Diagnosis not present

## 2017-07-15 DIAGNOSIS — C760 Malignant neoplasm of head, face and neck: Secondary | ICD-10-CM | POA: Diagnosis not present

## 2017-07-16 ENCOUNTER — Telehealth: Payer: Self-pay | Admitting: *Deleted

## 2017-07-16 DIAGNOSIS — C760 Malignant neoplasm of head, face and neck: Secondary | ICD-10-CM | POA: Diagnosis not present

## 2017-07-16 NOTE — Telephone Encounter (Signed)
Pt called requesting to cancel appts in August.   Stated she is currently staying at [facility] at [address] .  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   Pt would like to have follow up appt with Dr. Burr Medico  On a late Friday afternoon if possible. Pt's   Current phone        [phone].

## 2017-07-16 NOTE — Telephone Encounter (Signed)
Yes, please reschedule as she requested, thanks   [name] MD

## 2017-07-16 NOTE — Telephone Encounter (Signed)
Called pt at [facility] and left message on voice mail asking pt to call nurse back re:  When pt would like to follow up with Dr. Burr Medico on late Friday ( how soon pt wants to see MD ) .  Left message that our office will work with pt on date and time that is working for pt.

## 2017-07-17 ENCOUNTER — Telehealth: Payer: Self-pay

## 2017-07-17 DIAGNOSIS — C760 Malignant neoplasm of head, face and neck: Secondary | ICD-10-CM | POA: Diagnosis not present

## 2017-07-17 NOTE — Telephone Encounter (Signed)
Left voice message regarding appointment on 8/28 at 10:00 am, told her to let us know if that appointment does not work for her.

## 2017-07-18 ENCOUNTER — Other Ambulatory Visit: Payer: Self-pay

## 2017-07-18 ENCOUNTER — Ambulatory Visit: Payer: Self-pay

## 2017-07-18 DIAGNOSIS — C760 Malignant neoplasm of head, face and neck: Secondary | ICD-10-CM | POA: Diagnosis not present

## 2017-07-21 DIAGNOSIS — C760 Malignant neoplasm of head, face and neck: Secondary | ICD-10-CM | POA: Diagnosis not present

## 2017-07-22 DIAGNOSIS — C760 Malignant neoplasm of head, face and neck: Secondary | ICD-10-CM | POA: Diagnosis not present

## 2017-07-23 DIAGNOSIS — C760 Malignant neoplasm of head, face and neck: Secondary | ICD-10-CM | POA: Diagnosis not present

## 2017-07-25 DIAGNOSIS — C760 Malignant neoplasm of head, face and neck: Secondary | ICD-10-CM | POA: Diagnosis not present

## 2017-07-27 ENCOUNTER — Other Ambulatory Visit: Payer: Self-pay | Admitting: Internal Medicine

## 2017-07-28 DIAGNOSIS — C760 Malignant neoplasm of head, face and neck: Secondary | ICD-10-CM | POA: Diagnosis not present

## 2017-07-29 DIAGNOSIS — C760 Malignant neoplasm of head, face and neck: Secondary | ICD-10-CM | POA: Diagnosis not present

## 2017-07-30 DIAGNOSIS — C499 Malignant neoplasm of connective and soft tissue, unspecified: Secondary | ICD-10-CM | POA: Diagnosis not present

## 2017-07-30 DIAGNOSIS — C760 Malignant neoplasm of head, face and neck: Secondary | ICD-10-CM | POA: Diagnosis not present

## 2017-07-31 DIAGNOSIS — C760 Malignant neoplasm of head, face and neck: Secondary | ICD-10-CM | POA: Diagnosis not present

## 2017-08-01 DIAGNOSIS — C760 Malignant neoplasm of head, face and neck: Secondary | ICD-10-CM | POA: Diagnosis not present

## 2017-08-04 DIAGNOSIS — C760 Malignant neoplasm of head, face and neck: Secondary | ICD-10-CM | POA: Diagnosis not present

## 2017-08-05 DIAGNOSIS — C760 Malignant neoplasm of head, face and neck: Secondary | ICD-10-CM | POA: Diagnosis not present

## 2017-08-06 ENCOUNTER — Other Ambulatory Visit: Payer: Self-pay | Admitting: Internal Medicine

## 2017-08-06 DIAGNOSIS — C760 Malignant neoplasm of head, face and neck: Secondary | ICD-10-CM | POA: Diagnosis not present

## 2017-08-07 DIAGNOSIS — C760 Malignant neoplasm of head, face and neck: Secondary | ICD-10-CM | POA: Diagnosis not present

## 2017-08-08 DIAGNOSIS — C760 Malignant neoplasm of head, face and neck: Secondary | ICD-10-CM | POA: Diagnosis not present

## 2017-08-11 DIAGNOSIS — C760 Malignant neoplasm of head, face and neck: Secondary | ICD-10-CM | POA: Diagnosis not present

## 2017-08-12 DIAGNOSIS — C760 Malignant neoplasm of head, face and neck: Secondary | ICD-10-CM | POA: Diagnosis not present

## 2017-08-13 DIAGNOSIS — C760 Malignant neoplasm of head, face and neck: Secondary | ICD-10-CM | POA: Diagnosis not present

## 2017-08-14 DIAGNOSIS — C760 Malignant neoplasm of head, face and neck: Secondary | ICD-10-CM | POA: Diagnosis not present

## 2017-08-15 DIAGNOSIS — C760 Malignant neoplasm of head, face and neck: Secondary | ICD-10-CM | POA: Diagnosis not present

## 2017-08-18 DIAGNOSIS — C760 Malignant neoplasm of head, face and neck: Secondary | ICD-10-CM | POA: Diagnosis not present

## 2017-08-19 DIAGNOSIS — C760 Malignant neoplasm of head, face and neck: Secondary | ICD-10-CM | POA: Diagnosis not present

## 2017-08-20 DIAGNOSIS — C760 Malignant neoplasm of head, face and neck: Secondary | ICD-10-CM | POA: Diagnosis not present

## 2017-08-21 DIAGNOSIS — C760 Malignant neoplasm of head, face and neck: Secondary | ICD-10-CM | POA: Diagnosis not present

## 2017-08-22 DIAGNOSIS — C760 Malignant neoplasm of head, face and neck: Secondary | ICD-10-CM | POA: Diagnosis not present

## 2017-08-25 DIAGNOSIS — C760 Malignant neoplasm of head, face and neck: Secondary | ICD-10-CM | POA: Diagnosis not present

## 2017-08-26 DIAGNOSIS — C760 Malignant neoplasm of head, face and neck: Secondary | ICD-10-CM | POA: Diagnosis not present

## 2017-08-27 DIAGNOSIS — C760 Malignant neoplasm of head, face and neck: Secondary | ICD-10-CM | POA: Diagnosis not present

## 2017-08-29 ENCOUNTER — Telehealth: Payer: Self-pay | Admitting: Hematology

## 2017-08-29 NOTE — Telephone Encounter (Signed)
Returned patients call to cancel appts and r/s them. Unable to reach patient. Asked patient to call office to r/s at anytime.

## 2017-08-29 NOTE — Telephone Encounter (Signed)
Returned call to patient re rescheduled cxd appointments for 8/29. Not able to reach patient. Left message asking that patient call at her earliest convenience to let us know when she will be able to come in. Number listed under home is a facility [phone]) and patient has been discharged. Called patient at mobile, which was also number provided by patient [phone]).

## 2017-09-03 ENCOUNTER — Telehealth: Payer: Self-pay | Admitting: Hematology

## 2017-09-03 ENCOUNTER — Telehealth: Payer: Self-pay | Admitting: *Deleted

## 2017-09-03 NOTE — Telephone Encounter (Signed)
Voicemail reporting "I was transferred with my previous call.  At 1:37 pm, the connection was lost.  Call me at [phone]."     No further information provided.  Advised including D.O.B with messages left on voicemail.  This patient identifier helps staff with process of call returns to correct person.  Denies further needs or questions at this time.

## 2017-09-03 NOTE — Telephone Encounter (Signed)
Patient called in as she had been trying to call and had not been able to talk to anyone.  Call was transferred to me from Triage and I advised her of her next appointment.  She stated that she was unaware of appt and that no one had called her

## 2017-09-03 NOTE — Telephone Encounter (Signed)
"  I need to know if I need lab and injection for anemia.  Have called receiving voicemail.  I like the old system talking with a scheduler.  I did receive a message the appointment on the 29th has been cancelled.  Remove [phone].  This is not my number the [phone] is my land line.  My daughter Desmond Lope can be reached at [phone].  She should be called if you can't reach me.  She works in [employer] and is my P.O.A."

## 2017-09-04 ENCOUNTER — Telehealth: Payer: Self-pay

## 2017-09-04 NOTE — Telephone Encounter (Signed)
Spoke with patient apologized for her inconvenience and inability to reach someone in scheduling.  She is aware of her appointments on 8/22 and will keep them.

## 2017-09-04 NOTE — Telephone Encounter (Signed)
Spoke with patient again this morning to reverify her appts. On 8/22. Per 8/15 vm returns

## 2017-09-07 ENCOUNTER — Other Ambulatory Visit: Payer: Self-pay | Admitting: Cardiovascular Disease

## 2017-09-09 NOTE — Progress Notes (Signed)
[facility] PROGRESS NOTE DATE OF VISIT: 11/15/2016  [name], [name] Lacks, MD [place] [address]  DIAGNOSIS: Anemia in stage 4 chronic kidney disease ([place])  CHIEF COMPLAIN: f/u anemia   CURRENT THERAPY: Aranesp 200 mcg subcutaneous every 2 months for hemoglobin less than 11.  INTERVAL HISTORY:  [name] [name] 84 y.o. female with a history of anemia secondary to renal insufficiency is here for follow-up. 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 She is here alone. She used a walker. Her angiosarcoma is on her head and forehead.  She reports having low appetite and low energy, but able to function at home.      MEDICAL HISTORY: Past Medical History:  Diagnosis Date  . Anemia, unspecified   . Anxiety state, unspecified   . Depressive disorder, not elsewhere classified   . Diverticulosis of colon (without mention of hemorrhage)   . Diverticulosis of colon with hemorrhage   . Esophageal reflux   . History of echocardiogram    Echo 8/18: EF 55-60, normal wall motion, grade 2 diastolic dysfunction, mild AI, mild MR, PASP 32  . Lumbago   . Myocardial infarction ([place])   . Other and unspecified hyperlipidemia   . Persistent disorder of initiating or maintaining sleep   . Thyroid disease   . Unspecified asthma(493.90)   . Unspecified disorder resulting from impaired renal function   . Unspecified essential hypertension    Dr [name]  . Unspecified osteomyelitis, site unspecified   . Unspecified vitamin D deficiency    INTERIM HISTORY: has Vitamin D deficiency; Dyslipidemia; DEHYDRATION (VOLUME DEPLETION); Anxiety state; INSOMNIA, PERSISTENT; Essential hypertension; Coronary atherosclerosis; Bilateral carotid bruits; PERIPHERAL VASCULAR DISEASE; Asthma; GERD; DIVERTICULOSIS, COLON; Diverticulosis of colon with hemorrhage; PANCREATITIS; Disorder resulting from impaired renal  function; PYELONEPHRITIS; UNSPECIFIED DISORDER OF URETHRA&URINARY TRACT; LOW BACK PAIN; SYNCOPE; Fatigue; Rash; Anemia in chronic renal disease; Constipation; Hypothyroidism; A-fib ([place]); CHF (congestive heart failure) ([place]); PNA (pneumonia); Thyroid nodule; Influenza due to influenza A virus; Minimal cognitive impairment; Neuropathy; Disuse syndrome; Restless leg; Left leg weakness; Fall against object; Left ankle pain; Primary localized osteoarthrosis, lower leg; Alopecia; Pes anserine bursitis; Situational depression; Decreased ambulation status; Left-sided low back pain with sciatica; CKD (chronic kidney disease) stage 4, GFR 15-29 ml/min ([place]); Back pain; Compression fracture of T12 vertebra with delayed healing; Poor tolerance for ambulation; Osteoporosis; Piriformis syndrome of left side; Acute gout; Neck pain, musculoskeletal; Pubic ramus fracture ([place]); Herpes zoster; Pelvis fracture, right, sequela; Acute pain of right knee; Right foot pain; Upper respiratory infection; and Angiosarcoma of scalp ([place]) on their problem list.    ALLERGIES:  is allergic to codeine sulfate; darvon [propoxyphene]; propoxyphene napsylate; sulfacetamide sodium-sulfur; sulfamethoxazole-trimethoprim; and zolpidem.  MEDICATIONS: has a current medication list which includes the following prescription(s): acetaminophen, albuterol, aspirin, atorvastatin, b complex vitamins, biotin, cholecalciferol, darbepoetin, escitalopram, furosemide, levothyroxine, metoprolol tartrate, pantoprazole, sennosides-docusate sodium, cyclobenzaprine, and polyethylene glycol powder, and the following Facility-Administered Medications: darbepoetin alfa and darbepoetin alfa.  SURGICAL HISTORY:  Past Surgical History:  Procedure Laterality Date  . BONE MARROW BIOPSY    . CORONARY ARTERY BYPASS GRAFT  03/25/13   x4. CABS ON PUMP-Surgeon Jomarie Longs.  [name] PARTIAL COLECTOMY  1995   diverticulosis of colon iwth hemorrhage.   . status post right carotid  enterectomy      REVIEW OF SYSTEMS:  Constitutional: Denies fevers, chills (+) mild fatigue (+) low appetite Eyes: Denies blurriness  of vision Ears, nose, mouth, throat, and face: Denies mucositis or sore throat Respiratory: Denies cough, dyspnea or wheezes Cardiovascular: Denies palpitation, chest discomfort or lower extremity swelling  Gastrointestinal:  Denies nausea, heartburn or change in bowel habits (+) abdominal hernia Skin: Denies abnormal skin rashes (+) angiosarcoma on forehead, treated Lymphatics: Denies new lymphadenopathy or easy bruising Musculoskeletal: negative Neurological: Denies numbness, tingling or new weaknesses Behavioral/Psych: Mood is stable, no new changes  All other systems were reviewed with the patient and are negative.  PHYSICAL EXAMINATION: ECOG PERFORMANCE STATUS: 1  Blood pressure (!) 159/62, pulse 80, temperature 98.3 F (36.8 C), temperature source Oral, resp. rate 18, height '5\' 3"'  (1.6 m), weight 155 lb 14.4 oz (70.7 kg), SpO2 98 %.   GENERAL:alert, no distress and comfortable; elderly female who is well developed, well nourished. (+) hair loss SKIN: skin color, texture, turgor are normal, no rashes or significant lesions; scar on chest well healed. (+) scar on forehead from angiosarcoma treatment EYES: normal, Conjunctiva are pink and non-injected, sclera clear OROPHARYNX:no exudate, no erythema and lips, buccal mucosa, and tongue normal  NECK: supple, thyroid normal size, non-tender, without nodularity LYMPH:  no palpable lymphadenopathy in the cervical, axillary or supraclavicular LUNGS: clear to auscultation and percussion with normal breathing effort HEART: regular rate & rhythm and no murmurs and no lower extremity edema ABDOMEN:abdomen soft, non-tender and normal bowel sounds (+) protruding hernia from abdomen, no tenderness, soft Musculoskeletal:no cyanosis of digits and no clubbing  NEURO: alert & oriented x 3 with fluent speech, no focal  motor/sensory deficits  LABORATORY DATA: CBC Latest Ref Rng & Units 09/11/2017 05/19/2017 03/20/2017  WBC 3.9 - 10.3 K/uL 4.0 6.0 6.1  Hemoglobin 11.6 - 15.9 g/dL 9.1(L) 10.4(L) 10.5(L)  Hematocrit 34.8 - 46.6 % 27.3(L) 31.5(L) 32.6(L)  Platelets 145 - 400 K/uL 262 283 282    CMP Latest Ref Rng & Units 03/20/2017 01/17/2017 11/15/2016  Glucose 70 - 140 mg/dL 84 90 115  BUN 7 - 26 mg/dL 40(H) 35.5(H) 30.6(H)  Creatinine 0.60 - 1.10 mg/dL 2.01(H) 1.9(H) 1.7(H)  Sodium 136 - 145 mmol/L 140 139 139  Potassium 3.5 - 5.1 mmol/L 3.9 4.1 3.9  Chloride 98 - 109 mmol/L 104 - -  CO2 22 - 29 mmol/L '26 26 24  ' Calcium 8.4 - 10.4 mg/dL 9.2 9.3 9.3  Total Protein 6.4 - 8.3 g/dL 6.6 6.7 6.9  Total Bilirubin 0.2 - 1.2 mg/dL 0.6 0.55 0.62  Alkaline Phos 40 - 150 U/L 100 107 103  AST 5 - 34 U/L '26 27 26  ' ALT 0 - 55 U/L '16 15 13    ' Studies:  No results found.   RADIOGRAPHIC STUDY No new study   CT Lumbar Spine WO Contrast 01/07/16 IMPRESSION: No acute fractures seen today. There is a subacute, healing fracture at T11 with loss of height of only 10% or less. This was acute in September. Old augmented fracture at T12. Old un augmented fractures at the other lumbar levels as outlined above.  CT Right Hip WO Contrast 01/07/16 IMPRESSION: No evidence of femoral neck fracture. Questionable nondisplaced right superior pubic ramus fracture, only seen on coronal imaging. Early degenerative changes in the right hip.   ASSESSMENT AND PLAN:   [name] [name] 84 y.o. female with a history of No diagnosis found.   1. Anemia in chronic renal disease.   -She is clinically doing well, her hemoglobin today is 10.8 today, 05/13/16. She was able to maintain her hemoglobin well  with Aranesp every 3 months. However she previously complained about fatigue, and would like to go back to Aranesp injection every 2 months.  - The patient missed one cycle of Aranesp in December 2017 due to hospitalization. -We again  reviewed the benefit and the risks of Aranesp injection, especially the risk of thrombosis, including heart attack and stroke. She voiced good understanding.  -Her iron study 05/2015 was completely normal. Will check iron level every year when she is on Aranesp - Labs reviewed, Hb is 9.1. She missed her last appointment due to diagnosis of angiosarcoma. She was treated with surgery and radiation. She has recovered well  -continue lab and injection every 2 months if Hb<11.0 -will monitor her iron level  -F/u in 6 months   2. Chronic kidney disease.  --Avoid nephrotoxins and counseled on continued adequate hydration.   - I advised the patient that she could probably discontinue Lasix if she is no longer experiencing lower extremity edema. This could be contributing to her decreased kidney function. I will consult with Dr. Alain Marion on this, and encouraged the patient to discuss this with him as well when she sees him next.  3. Gout  -I previously encouraged her to follow-up with her primary care physician, and discussed if she would benefit from allopurinol to prevent gout flare -Her physician related her gout to hereditary trait. She watches what she eats as this can effect her gout flaring.    4. CAD, CHF, HTN, ARTHRITIS  -She will continue follow-up with her primary care physician  5. Hypertension -She will follow with primary care -Encouraged the patient to check blood pressure regularly at home  6. Osteoporosis - We reviewed the patient's history of hip fracture and many compression fractures in the lumbar spine. - The patient is not currently on any medication for osteoporosis, and reports she has never taken anything for this. - I encouraged the patient to discuss this with her PCP and consider Prolia   7. Angiosarcoma of scalp  -s/p radiation by Dr. Kateri Mc at [facility] -f/u with Dr. Kateri Mc    Follow-up.   -Labs reviewed. Aranesp injection today and every 2 months with labs, If  Hb<11.0 - Follow up in 6 months.   Patient had many questions, especially about her gout today. All questions were answered. The patient knows to call the clinic with any problems, questions or concerns. We can certainly see the patient much sooner if necessary.  I spent 15 minutes counseling the patient face to face. The total time spent in the appointment was 20 minutes.  Dierdre Searles Dweik am acting as scribe for Dr. [name].  I have reviewed the above documentation for accuracy and completeness, and I agree with the above.   [name]  09/11/2017

## 2017-09-11 ENCOUNTER — Inpatient Hospital Stay: Payer: Medicare Other

## 2017-09-11 ENCOUNTER — Inpatient Hospital Stay: Payer: Medicare Other | Attending: Hematology | Admitting: Hematology

## 2017-09-11 ENCOUNTER — Telehealth: Payer: Self-pay | Admitting: Hematology

## 2017-09-11 VITALS — BP 159/62 | HR 80 | Temp 98.3°F | Resp 18 | Ht 63.0 in | Wt 155.9 lb

## 2017-09-11 DIAGNOSIS — C490 Malignant neoplasm of connective and soft tissue of head, face and neck: Secondary | ICD-10-CM | POA: Insufficient documentation

## 2017-09-11 DIAGNOSIS — E559 Vitamin D deficiency, unspecified: Secondary | ICD-10-CM | POA: Diagnosis not present

## 2017-09-11 DIAGNOSIS — N189 Chronic kidney disease, unspecified: Principal | ICD-10-CM

## 2017-09-11 DIAGNOSIS — K219 Gastro-esophageal reflux disease without esophagitis: Secondary | ICD-10-CM | POA: Diagnosis not present

## 2017-09-11 DIAGNOSIS — F418 Other specified anxiety disorders: Secondary | ICD-10-CM | POA: Insufficient documentation

## 2017-09-11 DIAGNOSIS — D631 Anemia in chronic kidney disease: Secondary | ICD-10-CM

## 2017-09-11 DIAGNOSIS — I129 Hypertensive chronic kidney disease with stage 1 through stage 4 chronic kidney disease, or unspecified chronic kidney disease: Secondary | ICD-10-CM | POA: Diagnosis not present

## 2017-09-11 DIAGNOSIS — Z923 Personal history of irradiation: Secondary | ICD-10-CM | POA: Insufficient documentation

## 2017-09-11 DIAGNOSIS — N184 Chronic kidney disease, stage 4 (severe): Secondary | ICD-10-CM | POA: Diagnosis not present

## 2017-09-11 DIAGNOSIS — M1611 Unilateral primary osteoarthritis, right hip: Secondary | ICD-10-CM | POA: Insufficient documentation

## 2017-09-11 DIAGNOSIS — Z79899 Other long term (current) drug therapy: Secondary | ICD-10-CM | POA: Insufficient documentation

## 2017-09-11 DIAGNOSIS — I509 Heart failure, unspecified: Secondary | ICD-10-CM | POA: Insufficient documentation

## 2017-09-11 DIAGNOSIS — I2510 Atherosclerotic heart disease of native coronary artery without angina pectoris: Secondary | ICD-10-CM | POA: Diagnosis not present

## 2017-09-11 DIAGNOSIS — E785 Hyperlipidemia, unspecified: Secondary | ICD-10-CM | POA: Diagnosis not present

## 2017-09-11 DIAGNOSIS — I252 Old myocardial infarction: Secondary | ICD-10-CM | POA: Insufficient documentation

## 2017-09-11 DIAGNOSIS — M109 Gout, unspecified: Secondary | ICD-10-CM | POA: Insufficient documentation

## 2017-09-11 DIAGNOSIS — Z8719 Personal history of other diseases of the digestive system: Secondary | ICD-10-CM | POA: Diagnosis not present

## 2017-09-11 DIAGNOSIS — M810 Age-related osteoporosis without current pathological fracture: Secondary | ICD-10-CM | POA: Insufficient documentation

## 2017-09-11 LAB — CBC WITH DIFFERENTIAL/PLATELET
BASOS ABS: 0.1 10*3/uL (ref 0.0–0.1)
BASOS PCT: 2 %
Eosinophils Absolute: 0.3 10*3/uL (ref 0.0–0.5)
Eosinophils Relative: 7 %
HCT: 27.3 % — ABNORMAL LOW (ref 34.8–46.6)
HEMOGLOBIN: 9.1 g/dL — AB (ref 11.6–15.9)
LYMPHS PCT: 14 %
Lymphs Abs: 0.6 10*3/uL — ABNORMAL LOW (ref 0.9–3.3)
MCH: 30.6 pg (ref 25.1–34.0)
MCHC: 33.3 g/dL (ref 31.5–36.0)
MCV: 91.9 fL (ref 79.5–101.0)
MONO ABS: 0.5 10*3/uL (ref 0.1–0.9)
MONOS PCT: 11 %
NEUTROS PCT: 66 %
Neutro Abs: 2.7 10*3/uL (ref 1.5–6.5)
Platelets: 262 10*3/uL (ref 145–400)
RBC: 2.97 MIL/uL — ABNORMAL LOW (ref 3.70–5.45)
RDW: 14.5 % (ref 11.2–14.5)
WBC: 4 10*3/uL (ref 3.9–10.3)

## 2017-09-11 MED ORDER — DARBEPOETIN ALFA 200 MCG/0.4ML IJ SOSY
PREFILLED_SYRINGE | INTRAMUSCULAR | Status: AC
  Filled 2017-09-11: qty 0.4

## 2017-09-11 MED ORDER — DARBEPOETIN ALFA 200 MCG/0.4ML IJ SOSY
200.0000 ug | PREFILLED_SYRINGE | Freq: Once | INTRAMUSCULAR | Status: AC
  Administered 2017-09-11: 200 ug via SUBCUTANEOUS

## 2017-09-11 NOTE — Telephone Encounter (Signed)
Appts scheduled AVS/Calendar printed per 8/22 los °

## 2017-09-11 NOTE — Patient Instructions (Signed)
Darbepoetin Alfa injection What is this medicine? DARBEPOETIN ALFA (dar be POE e tin AL fa) helps your body make more red blood cells. It is used to treat anemia caused by chronic kidney failure and chemotherapy. This medicine may be used for other purposes; ask your health care provider or pharmacist if you have questions. What should I tell my health care provider before I take this medicine? They need to know if you have any of these conditions: -blood clotting disorders or history of blood clots -cancer patient not on chemotherapy -cystic fibrosis -heart disease, such as angina, heart failure, or a history of a heart attack -hemoglobin level of 12 g/dL or greater -high blood pressure -low levels of folate, iron, or vitamin B12 -seizures -an unusual or allergic reaction to darbepoetin, erythropoietin, albumin, hamster proteins, latex, other medicines, foods, dyes, or preservatives -pregnant or trying to get pregnant -breast-feeding How should I use this medicine? This medicine is for injection into a vein or under the skin. It is usually given by a health care professional in a hospital or clinic setting. If you get this medicine at home, you will be taught how to prepare and give this medicine. Do not shake the solution before you withdraw a dose. Use exactly as directed. Take your medicine at regular intervals. Do not take your medicine more often than directed. It is important that you put your used needles and syringes in a special sharps container. Do not put them in a trash can. If you do not have a sharps container, call your pharmacist or healthcare provider to get one. Talk to your pediatrician regarding the use of this medicine in children. While this medicine may be used in children as young as 1 year for selected conditions, precautions do apply. Overdosage: If you think you have taken too much of this medicine contact a poison control center or emergency room at once. NOTE:  This medicine is only for you. Do not share this medicine with others. What if I miss a dose? If you miss a dose, take it as soon as you can. If it is almost time for your next dose, take only that dose. Do not take double or extra doses. What may interact with this medicine? Do not take this medicine with any of the following medications: -epoetin alfa This list may not describe all possible interactions. Give your health care provider a list of all the medicines, herbs, non-prescription drugs, or dietary supplements you use. Also tell them if you smoke, drink alcohol, or use illegal drugs. Some items may interact with your medicine. What should I watch for while using this medicine? Visit your prescriber or health care professional for regular checks on your progress and for the needed blood tests and blood pressure measurements. It is especially important for the doctor to make sure your hemoglobin level is in the desired range, to limit the risk of potential side effects and to give you the best benefit. Keep all appointments for any recommended tests. Check your blood pressure as directed. Ask your doctor what your blood pressure should be and when you should contact him or her. As your body makes more red blood cells, you may need to take iron, folic acid, or vitamin B supplements. Ask your doctor or health care provider which products are right for you. If you have kidney disease continue dietary restrictions, even though this medication can make you feel better. Talk with your doctor or health care professional about the   foods you eat and the vitamins that you take. What side effects may I notice from receiving this medicine? Side effects that you should report to your doctor or health care professional as soon as possible: -allergic reactions like skin rash, itching or hives, swelling of the face, lips, or tongue -breathing problems -changes in vision -chest pain -confusion, trouble speaking  or understanding -feeling faint or lightheaded, falls -high blood pressure -muscle aches or pains -pain, swelling, warmth in the leg -rapid weight gain -severe headaches -sudden numbness or weakness of the face, arm or leg -trouble walking, dizziness, loss of balance or coordination -seizures (convulsions) -swelling of the ankles, feet, hands -unusually weak or tired Side effects that usually do not require medical attention (report to your doctor or health care professional if they continue or are bothersome): -diarrhea -fever, chills (flu-like symptoms) -headaches -nausea, vomiting -redness, stinging, or swelling at site where injected This list may not describe all possible side effects. Call your doctor for medical advice about side effects. You may report side effects to FDA at 1-800-FDA-1088. Where should I keep my medicine? Keep out of the reach of children. Store in a refrigerator between 2 and 8 degrees C (36 and 46 degrees F). Do not freeze. Do not shake. Throw away any unused portion if using a single-dose vial. Throw away any unused medicine after the expiration date. NOTE: This sheet is a summary. It may not cover all possible information. If you have questions about this medicine, talk to your doctor, pharmacist, or health care provider.    2016, Elsevier/[employer]. (2007-12-22 10:23:57)  

## 2017-09-13 ENCOUNTER — Encounter: Payer: Self-pay | Admitting: Hematology

## 2017-09-16 ENCOUNTER — Telehealth: Payer: Self-pay | Admitting: Internal Medicine

## 2017-09-16 NOTE — Telephone Encounter (Signed)
Insurance has been submitted and verified for Prolia. Patient is responsible for a $0 copay. Patient due on or after 09/25/2017. Left message for patient to call back to schedule.  Okay to schedule... Visit Note: Prolia ($0 copay - okay to give per Gareth Eagle) Visit Type: Nurse Provider: Nurse

## 2017-09-17 ENCOUNTER — Ambulatory Visit: Payer: Self-pay | Admitting: Hematology

## 2017-09-17 ENCOUNTER — Other Ambulatory Visit: Payer: Self-pay

## 2017-09-17 ENCOUNTER — Ambulatory Visit: Payer: Self-pay

## 2017-09-17 DIAGNOSIS — Z85831 Personal history of malignant neoplasm of soft tissue: Secondary | ICD-10-CM | POA: Diagnosis not present

## 2017-09-17 DIAGNOSIS — Z923 Personal history of irradiation: Secondary | ICD-10-CM | POA: Diagnosis not present

## 2017-09-17 DIAGNOSIS — R918 Other nonspecific abnormal finding of lung field: Secondary | ICD-10-CM | POA: Diagnosis not present

## 2017-09-17 DIAGNOSIS — Z08 Encounter for follow-up examination after completed treatment for malignant neoplasm: Secondary | ICD-10-CM | POA: Diagnosis not present

## 2017-09-18 ENCOUNTER — Ambulatory Visit: Payer: Self-pay

## 2017-09-18 ENCOUNTER — Other Ambulatory Visit: Payer: Self-pay

## 2017-09-18 ENCOUNTER — Ambulatory Visit: Payer: Self-pay | Admitting: Hematology

## 2017-11-07 ENCOUNTER — Ambulatory Visit (INDEPENDENT_AMBULATORY_CARE_PROVIDER_SITE_OTHER): Payer: Medicare Other | Admitting: Family Medicine

## 2017-11-07 ENCOUNTER — Other Ambulatory Visit (INDEPENDENT_AMBULATORY_CARE_PROVIDER_SITE_OTHER): Payer: Medicare Other

## 2017-11-07 ENCOUNTER — Encounter: Payer: Self-pay | Admitting: Family Medicine

## 2017-11-07 ENCOUNTER — Ambulatory Visit (INDEPENDENT_AMBULATORY_CARE_PROVIDER_SITE_OTHER)
Admission: RE | Admit: 2017-11-07 | Discharge: 2017-11-07 | Disposition: A | Discharge status: Home / Self Care | Payer: Medicare Other | Source: Ambulatory Visit | Attending: Family Medicine | Admitting: Family Medicine

## 2017-11-07 ENCOUNTER — Ambulatory Visit: Payer: Medicare Other

## 2017-11-07 VITALS — BP 182/84 | HR 70 | Temp 98.0°F | Resp 16

## 2017-11-07 DIAGNOSIS — S4992XA Unspecified injury of left shoulder and upper arm, initial encounter: Secondary | ICD-10-CM | POA: Diagnosis not present

## 2017-11-07 DIAGNOSIS — M25512 Pain in left shoulder: Secondary | ICD-10-CM

## 2017-11-07 DIAGNOSIS — S299XXA Unspecified injury of thorax, initial encounter: Secondary | ICD-10-CM | POA: Diagnosis not present

## 2017-11-07 DIAGNOSIS — R55 Syncope and collapse: Secondary | ICD-10-CM

## 2017-11-07 DIAGNOSIS — R0781 Pleurodynia: Secondary | ICD-10-CM

## 2017-11-07 DIAGNOSIS — Z23 Encounter for immunization: Secondary | ICD-10-CM

## 2017-11-07 LAB — CBC
HCT: 32.2 % — ABNORMAL LOW (ref 36.0–46.0)
HEMOGLOBIN: 11.1 g/dL — AB (ref 12.0–15.0)
MCHC: 34.5 g/dL (ref 30.0–36.0)
MCV: 93.8 fl (ref 78.0–100.0)
PLATELETS: 242 10*3/uL (ref 150.0–400.0)
RBC: 3.43 Mil/uL — ABNORMAL LOW (ref 3.87–5.11)
RDW: 13.7 % (ref 11.5–15.5)
WBC: 8.2 10*3/uL (ref 4.0–10.5)

## 2017-11-07 LAB — COMPREHENSIVE METABOLIC PANEL
ALT: 14 U/L (ref 0–35)
AST: 25 U/L (ref 0–37)
Albumin: 4 g/dL (ref 3.5–5.2)
Alkaline Phosphatase: 77 U/L (ref 39–117)
BUN: 33 mg/dL — AB (ref 6–23)
CO2: 29 meq/L (ref 19–32)
Calcium: 9.6 mg/dL (ref 8.4–10.5)
Chloride: 98 mEq/L (ref 96–112)
Creatinine, Ser: 1.93 mg/dL — ABNORMAL HIGH (ref 0.40–1.20)
GFR: 26.25 mL/min — ABNORMAL LOW (ref >60.00)
GLUCOSE: 95 mg/dL (ref 70–99)
Potassium: 3.6 mEq/L (ref 3.5–5.1)
SODIUM: 137 meq/L (ref 135–145)
Total Bilirubin: 0.6 mg/dL (ref 0.2–1.2)
Total Protein: 7.3 g/dL (ref 6.0–8.3)

## 2017-11-07 NOTE — Patient Instructions (Signed)
Nice to meet you  Please try compression and heat on the areas where there is a hematoma  Please try to remember to take a deep breath  I will call you with the results from today  Please see me back in 2-3 weeks if your symptoms are not improved.

## 2017-11-07 NOTE — Progress Notes (Signed)
[name] [name] - 85 y.o. female MRN [id number]  Date of birth: [birth year]  SUBJECTIVE:  Including CC & ROS.  No chief complaint on file.   [name] [name] is a 84 y.o. female that is presenting with left shoulder pain, left anterior lower chest pain, and syncope.  She experienced an episode of syncope where she was transitioning from standing to sitting on the toilet.  She is unsure of what happened.  She denies any sweating or chest pain prior to this event.  She fell and landed on her left side.  She was discovered acutely.  She does live by herself but was not at home when this occurred.  She is presenting with left shoulder pain with a bruising on the left lateral aspect.  She is having some left anterior lower left-sided chest pain.  She has pain with a deep breath.  She denies any shortness of breath.  She denies any palpitations.  She feels no chest pressure.  She denies any fluttering of her heart.  She denies any change in her medications.  She has had diarrhea recently.  She has not taken any medications for this.   Review of Systems  Constitutional: Negative for fever.  HENT: Negative for congestion.   Respiratory: Negative for cough.   Cardiovascular: Positive for chest pain.  Gastrointestinal: Positive for diarrhea. Negative for abdominal pain.  Musculoskeletal: Positive for arthralgias, back pain and gait problem.  Skin: Positive for color change.  Neurological: Positive for syncope.  Hematological: Positive for adenopathy.    HISTORY: Past Medical, Surgical, Social, and Family History Reviewed & Updated per EMR.   Pertinent Historical Findings include:  Past Medical History:  Diagnosis Date  . Anemia, unspecified   . Anxiety state, unspecified   . Depressive disorder, not elsewhere classified   . Diverticulosis of colon (without mention of hemorrhage)   . Diverticulosis of colon with hemorrhage   . Esophageal reflux   . History of echocardiogram    Echo 8/18: EF 55-60,  normal wall motion, grade 2 diastolic dysfunction, mild AI, mild MR, PASP 32  . Lumbago   . Myocardial infarction ([place])   . Other and unspecified hyperlipidemia   . Persistent disorder of initiating or maintaining sleep   . Thyroid disease   . Unspecified asthma(493.90)   . Unspecified disorder resulting from impaired renal function   . Unspecified essential hypertension    Dr [name]  . Unspecified osteomyelitis, site unspecified   . Unspecified vitamin D deficiency     Past Surgical History:  Procedure Laterality Date  . BONE MARROW BIOPSY    . CORONARY ARTERY BYPASS GRAFT  03/25/13   x4. CABS ON PUMP-Surgeon Jomarie Longs.  [name] PARTIAL COLECTOMY  1995   diverticulosis of colon iwth hemorrhage.   . status post right carotid enterectomy      Allergies  Allergen Reactions  . Codeine Sulfate Nausea And Vomiting  . Darvon [Propoxyphene] Nausea And Vomiting    hallucinations  . Propoxyphene Napsylate Nausea And Vomiting  . Sulfacetamide Sodium-Sulfur Nausea And Vomiting  . Sulfamethoxazole-Trimethoprim Nausea Only    Can't remember reaction  . Zolpidem     Other reaction(s): Mental Status Changes (intolerance)    Family History  Problem Relation Age of Onset  . Hypertension Other   . COPD Mother   . Cancer Father        lymphoma  . Breast cancer Cousin   . Breast cancer Cousin      Social  History   Socioeconomic History  . Marital status: Divorced    Spouse name: Not on file  . Number of children: Not on file  . Years of education: Not on file  . Highest education level: Not on file  Occupational History  . Not on file  Social Needs  . Financial resource strain: Not hard at all  . Food insecurity:    Worry: Never true    Inability: Never true  . Transportation needs:    Medical: No    Non-medical: No  Tobacco Use  . Smoking status: Never Smoker  . Smokeless tobacco: Never Used  Substance and Sexual Activity  . Alcohol use: Yes    Alcohol/week: 1.0 - 2.0  standard drinks    Types: 1 - 2 Shots of liquor per week  . Drug use: No  . Sexual activity: Never  Lifestyle  . Physical activity:    Days per week: 0 days    Minutes per session: 0 min  . Stress: Only a little  Relationships  . Social connections:    Talks on phone: More than three times a week    Gets together: More than three times a week    Attends religious service: More than 4 times per year    Active member of club or organization: Not on file    Attends meetings of clubs or organizations: Not on file    Relationship status: Not on file  . Intimate partner violence:    Fear of current or ex partner: Not on file    Emotionally abused: Not on file    Physically abused: Not on file    Forced sexual activity: Not on file  Other Topics Concern  . Not on file  Social History Narrative   Admitted to skill unit [facility] 01/11/16   Divorced   Never smoked   Alcohol 1-2 drinks  Of liquor weekly   POA        PHYSICAL EXAM:  VS: BP (!) 182/84   Pulse 70   Temp 98 F (36.7 C) (Oral)   Resp 16   SpO2 97%  Physical Exam Gen: NAD, alert, cooperative with exam,  ENT: normal lips, normal nasal mucosa,  Eye: normal EOM, normal conjunctiva and lids CV:  no edema, +2 pedal pulses   Resp: no accessory muscle use, non-labored,  GI: no masses or tenderness, no hernia  Skin: no rashes, no areas of induration  Neuro: normal tone, normal sensation to touch Psych:  normal insight, alert and oriented MSK:  Left shoulder: Ecchymosis on the left lateral aspect of her humerus. Limited active range of motion to about 90 degrees. Normal passive range of motion. Normal empty can testing. Negative drop arm test. Chest: Some tenderness to palpation at the midclavicular line near the ninth and 10th rib. No ecchymosis over this area. Neurovascular intact  Limited ultrasound: Left shoulder/chest:  Left shoulder: No tear appreciated of the supraspinatus. Hematoma  development appreciated on the left lateral.  Chest: There appears to be hematoma development superficial to the ninth and 10th rib. There is no ribs that are suggestive to be broken with no effusion or change in the cortex. No pneumothorax appreciated  Summary: Left arm with hematoma.  Left anterior chest with hematoma no appreciated rib fracture.  Ultrasound and interpretation by Clearance Coots, MD        ASSESSMENT & PLAN:   Syncope Unclear to the source of her syncope.  She has  had diarrhea with several bouts over the past couple of days.  Possible to be associated with that.  Denies any seizure-like activity but was unwitnessed.  Possibly associated with arrhythmia. -Counseled to follow-up with cardiology and primary care. -Lab work today.   Acute pain of left shoulder Pain is been occurring since her fall.  I do not appreciate a fracture or a rotator cuff tear. -X-ray. -Counseled on supportive care -If no improvement consider injection or physical therapy  Rib pain on left side Pain likely result of the sprain of the costochondral joints.  I do not appreciate any rib fracture or pneumothorax on exam today. -Rib x-ray. -Counseled supportive care and taking deep breaths. -Pennsaid samples provided. -If no improvement consider CT scan.

## 2017-11-09 DIAGNOSIS — M25512 Pain in left shoulder: Secondary | ICD-10-CM | POA: Insufficient documentation

## 2017-11-09 DIAGNOSIS — R0781 Pleurodynia: Secondary | ICD-10-CM | POA: Insufficient documentation

## 2017-11-09 NOTE — Assessment & Plan Note (Signed)
Pain likely result of the sprain of the costochondral joints.  I do not appreciate any rib fracture or pneumothorax on exam today. -Rib x-ray. -Counseled supportive care and taking deep breaths. -Pennsaid samples provided. -If no improvement consider CT scan.

## 2017-11-09 NOTE — Assessment & Plan Note (Signed)
Pain is been occurring since her fall.  I do not appreciate a fracture or a rotator cuff tear. -X-ray. -Counseled on supportive care -If no improvement consider injection or physical therapy

## 2017-11-09 NOTE — Assessment & Plan Note (Signed)
Unclear to the source of her syncope.  She has had diarrhea with several bouts over the past couple of days.  Possible to be associated with that.  Denies any seizure-like activity but was unwitnessed.  Possibly associated with arrhythmia. -Counseled to follow-up with cardiology and primary care. -Lab work today.

## 2017-11-10 ENCOUNTER — Telehealth: Payer: Self-pay | Admitting: Family Medicine

## 2017-11-10 NOTE — Telephone Encounter (Signed)
Left VM for patient's daughter. If she calls back please have her speak with a nurse/CMA and inform that her mother may have a possible fracture at the 6th rib. Labs show improvement and no fracture of her arm/shoulder. The [facility] can report results to patient.   If any questions then please take the best time and phone number to call and I will try to call her back.   Rosemarie Ax, MD [facility] 11/10/2017, 2:42 PM

## 2017-11-10 NOTE — Telephone Encounter (Addendum)
Pt's daughter, Desmond Lope given information per notes of Dr. Raeford Razor on 11/10/17. Understanding verbalized. No other concerns voiced at this time.

## 2017-11-11 ENCOUNTER — Inpatient Hospital Stay: Payer: Medicare Other

## 2017-11-11 ENCOUNTER — Inpatient Hospital Stay: Payer: Medicare Other | Attending: Hematology

## 2017-11-11 VITALS — BP 156/65 | HR 68 | Temp 97.6°F | Resp 16

## 2017-11-11 DIAGNOSIS — Z79899 Other long term (current) drug therapy: Secondary | ICD-10-CM | POA: Diagnosis not present

## 2017-11-11 DIAGNOSIS — N189 Chronic kidney disease, unspecified: Principal | ICD-10-CM

## 2017-11-11 DIAGNOSIS — I129 Hypertensive chronic kidney disease with stage 1 through stage 4 chronic kidney disease, or unspecified chronic kidney disease: Secondary | ICD-10-CM | POA: Diagnosis not present

## 2017-11-11 DIAGNOSIS — D631 Anemia in chronic kidney disease: Secondary | ICD-10-CM | POA: Insufficient documentation

## 2017-11-11 DIAGNOSIS — N183 Chronic kidney disease, stage 3 (moderate): Secondary | ICD-10-CM | POA: Diagnosis not present

## 2017-11-11 LAB — CBC WITH DIFFERENTIAL/PLATELET
ABS IMMATURE GRANULOCYTES: 0.02 10*3/uL (ref 0.00–0.07)
Basophils Absolute: 0 10*3/uL (ref 0.0–0.1)
Basophils Relative: 1 %
EOS PCT: 4 %
Eosinophils Absolute: 0.2 10*3/uL (ref 0.0–0.5)
HEMATOCRIT: 31.7 % — AB (ref 36.0–46.0)
HEMOGLOBIN: 10.4 g/dL — AB (ref 12.0–15.0)
Immature Granulocytes: 0 %
LYMPHS PCT: 14 %
Lymphs Abs: 0.7 10*3/uL (ref 0.7–4.0)
MCH: 31.3 pg (ref 26.0–34.0)
MCHC: 32.8 g/dL (ref 30.0–36.0)
MCV: 95.5 fL (ref 80.0–100.0)
MONO ABS: 0.4 10*3/uL (ref 0.1–1.0)
MONOS PCT: 7 %
NEUTROS ABS: 4 10*3/uL (ref 1.7–7.7)
Neutrophils Relative %: 74 %
PLATELETS: 222 10*3/uL (ref 150–400)
RBC: 3.32 MIL/uL — AB (ref 3.87–5.11)
RDW: 13 % (ref 11.5–15.5)
WBC: 5.4 10*3/uL (ref 4.0–10.5)
nRBC: 0 % (ref 0.0–0.2)

## 2017-11-11 MED ORDER — DARBEPOETIN ALFA 200 MCG/0.4ML IJ SOSY
200.0000 ug | PREFILLED_SYRINGE | Freq: Once | INTRAMUSCULAR | Status: AC
  Administered 2017-11-11: 200 ug via SUBCUTANEOUS

## 2017-11-11 MED ORDER — DARBEPOETIN ALFA 200 MCG/0.4ML IJ SOSY
PREFILLED_SYRINGE | INTRAMUSCULAR | Status: AC
  Filled 2017-11-11: qty 0.4

## 2017-11-11 NOTE — Patient Instructions (Signed)

## 2017-11-27 DIAGNOSIS — C44321 Squamous cell carcinoma of skin of nose: Secondary | ICD-10-CM | POA: Diagnosis not present

## 2017-11-27 DIAGNOSIS — Z85828 Personal history of other malignant neoplasm of skin: Secondary | ICD-10-CM | POA: Diagnosis not present

## 2017-11-27 DIAGNOSIS — D485 Neoplasm of uncertain behavior of skin: Secondary | ICD-10-CM | POA: Diagnosis not present

## 2017-12-04 ENCOUNTER — Other Ambulatory Visit: Payer: Self-pay | Admitting: *Deleted

## 2017-12-04 MED ORDER — FUROSEMIDE 20 MG PO TABS: 20.0000 mg | ORAL_TABLET | Freq: Every day | ORAL | 0 refills | Status: DC

## 2017-12-04 NOTE — Progress Notes (Signed)
CARDIOLOGY OFFICE NOTE  Date:  12/10/2017    Eulogio Ditch Date of Birth: May 06, 1931 Medical Record #[id number]  PCP:  Cassandria Anger, MD  Cardiologist:  [name]    No chief complaint on file.   History of Present Illness: [name] [name] is a 84 y.o. female f/u CAD/CABG CEA and palpitations     She has a history of HTN, palpitations (negative event monitor back in 2014) and fatigue. Has known PAD with prior RCEA with Dr. Amedeo Plenty and has residual carotid disease. Had diverticular bleed back in 2014 - off asa.   Hospitalized at [facility] for syncope in March 2015 -  had chest pain and cath with subsequent CABG  3/15 with Dr Humphrey Rolls with SVG d RCA, SVG OM1, distal circumflex and LIMA LAD. Had brief periop afib Rx with amidarone and left hospital in NSR.   Seen by PA 09/09/16 for dyspnea and palpitations Echo and event monitor ok in f/u  Seen by primary 11/07/17 after ? "syncope" getting up in bathroom. [place] and hurt her left side ? Left 6 th rib fracture She had no palpitations , chest pain or dyspnea prior to the event She did have diarrhea and may have been dehydrated   Diagnosed with angiosarcoma of left side of scalp Finished with XRT   Past Medical History:  Diagnosis Date  . Anemia, unspecified   . Angiosarcoma ([place])    dx in April   . Anxiety state, unspecified   . Cancer ([place])   . Depressive disorder, not elsewhere classified   . Diverticulosis of colon (without mention of hemorrhage)   . Diverticulosis of colon with hemorrhage   . Esophageal reflux   . History of echocardiogram    Echo 8/18: EF 55-60, normal wall motion, grade 2 diastolic dysfunction, mild AI, mild MR, PASP 32  . Lumbago   . Myocardial infarction ([place])   . Other and unspecified hyperlipidemia   . Persistent disorder of initiating or maintaining sleep   . Thyroid disease   . Unspecified asthma(493.90)   . Unspecified disorder resulting from impaired renal function   . Unspecified essential  hypertension    Dr [name]  . Unspecified osteomyelitis, site unspecified   . Unspecified vitamin D deficiency     Past Surgical History:  Procedure Laterality Date  . BONE MARROW BIOPSY    . CORONARY ARTERY BYPASS GRAFT  03/25/13   x4. CABS ON PUMP-Surgeon Jomarie Longs.  [name] PARTIAL COLECTOMY  1995   diverticulosis of colon iwth hemorrhage.   . status post right carotid enterectomy       Medications: Current Meds  Medication Sig  . acetaminophen (TYLENOL) 325 MG tablet Take 2 tablets (650 mg total) by mouth every 6 (six) hours as needed for mild pain (or Fever >/= 101).  [name] albuterol (PROVENTIL HFA;VENTOLIN HFA) 108 (90 Base) MCG/ACT inhaler Inhale 1-2 puffs into the lungs every 6 (six) hours as needed for wheezing or shortness of breath.  . allopurinol (ZYLOPRIM) 100 MG tablet TAKE 1 TABLET BY MOUTH EVERY DAY  . aspirin EC 81 MG tablet Take 1 tablet (81 mg total) by mouth daily.  [name] atorvastatin (LIPITOR) 40 MG tablet TAKE 1 TABLET DAILY AT 6PM  . b complex vitamins capsule Take 1 capsule by mouth daily.  . Biotin 5000 MCG CAPS Take by mouth 1 day or 1 dose.  . budesonide-formoterol (SYMBICORT) 160-4.5 MCG/ACT inhaler Inhale 2 puffs into the lungs 2 (two) times daily.  [name]  Cholecalciferol (EQL VITAMIN D3) 1000 UNITS tablet Take 1 tablet (1,000 Units total) by mouth daily.  . darbepoetin (ARANESP) 100 MCG/0.5ML SOLN injection Inject 100 mcg into the skin every 8 (eight) weeks.  [name] escitalopram (LEXAPRO) 5 MG tablet Take 1 tablet (5 mg total) by mouth daily.  [name] levothyroxine (SYNTHROID, LEVOTHROID) 50 MCG tablet TAKE 1 TABLET BY MOUTH EVERY DAY BEFORE BREAKFAST  . metoprolol tartrate (LOPRESSOR) 25 MG tablet TAKE 1/2 TABLET BY MOUTH TWICE A DAY  . pantoprazole (PROTONIX) 40 MG tablet Take 1 tablet (40 mg total) by mouth daily as needed.  . polyethylene glycol powder (GLYCOLAX/MIRALAX) powder Take 17 g by mouth 2 (two) times daily as needed for mild constipation or moderate constipation.  .  triamcinolone cream (KENALOG) 0.1 % Apply 1 application topically 2 (two) times daily.     Allergies: Allergies  Allergen Reactions  . Codeine Sulfate Nausea And Vomiting  . Darvon [Propoxyphene] Nausea And Vomiting    hallucinations  . Propoxyphene Napsylate Nausea And Vomiting  . Sulfacetamide Sodium-Sulfur Nausea And Vomiting  . Sulfamethoxazole-Trimethoprim Nausea Only    Can't remember reaction  . Zolpidem     Other reaction(s): Mental Status Changes (intolerance)    Social History: The patient  reports that she has never smoked. She has never used smokeless tobacco. She reports that she drinks about 1.0 - 2.0 standard drinks of alcohol per week. She reports that she does not use drugs.   Family History: The patient's family history includes Breast cancer in her cousin and cousin; COPD in her mother; Cancer in her father; Hypertension in her other.   Review of Systems: Please see the history of present illness.   Otherwise, the review of systems is positive for none.   All other systems are reviewed and negative.   Physical Exam: VS:  BP 128/66   Pulse 80   Ht _0  (1.6 m)   Wt 150 lb 8 oz (68.3 kg)   SpO2 98%   BMI 26.66 kg/m  .  BMI Body mass index is 26.66 kg/m.  Wt Readings from Last 3 Encounters:  12/10/17 150 lb 8 oz (68.3 kg)  09/11/17 155 lb 14.4 oz (70.7 kg)  07/04/17 165 lb (74.8 kg)    Affect appropriate Healthy:  appears stated age HEENT: Alopecia  Neck supple with no adenopathy JVP normal no bruits no thyromegaly Lungs clear with no wheezing and good diaphragmatic motion Heart:  S1/S2 no murmur, no rub, gallop or click PMI normal Abdomen: benighn, BS positve, no tenderness, no AAA no bruit.  No HSM or HJR Distal pulses intact with no bruits No edema Neuro non-focal Skin warm and dry No muscular weakness     LABORATORY DATA:  EKG:   SR rate 64 borderline LVH lateral T wave changes I,AVl 09/09/16   Lab Results  Component Value Date    WBC 5.4 11/11/2017   HGB 10.4 (L) 11/11/2017   HCT 31.7 (L) 11/11/2017   PLT 222 11/11/2017   GLUCOSE 95 11/07/2017   CHOL 154 06/07/2015   TRIG 96.0 06/07/2015   HDL 47.60 06/07/2015   LDLCALC 87 06/07/2015   ALT 14 11/07/2017   AST 25 11/07/2017   NA 137 11/07/2017   K 3.6 11/07/2017   CL 98 11/07/2017   CREATININE 1.93 (H) 11/07/2017   BUN 33 (H) 11/07/2017   CO2 29 11/07/2017   TSH 2.340 09/09/2016   INR 0.98 05/03/2014     BNP (last 3 results) No  results for input(s): BNP in the last 8760 hours.  ProBNP (last 3 results) No results for input(s): PROBNP in the last 8760 hours.   Other Studies Reviewed Today:   Assessment/Plan:  1. Palpitations/?recurrent syncope -  Event monitor clearly showed NSR with her symptoms 09/19/16 Recent episode likely from diarrhea and dehydration  Given 84 yo CABG will order lexiscan myovue  As her original CAD symptoms were atypical   2. Shortness of breath -   Doubt cardiac echo reviewed 09/19/16 EF 55-60%  3. CAD/CABG:  2015  [facility] SVG OM/PLB  SVG RCA  LIMA LAD  No angina continue medical Rx  4. PAF:  In setting post CABG - she remains in NSR    5. Carotid: plaque no stenosis by duplex 09/26/2016 Previous right CEA  repeat 09/2018   6. HLD - continue lipitor target LDL less than 70    7. Anxiety: with insomnia f/u primary continue lexapro   8. Angiosarcoma:  F/u [facility] post XRT PET scan next month   Current medicines are reviewed with the patient today.  The patient does not have concerns regarding medicines other than what has been noted above.  The following changes have been made:  See above.  Labs/ tests ordered today include:  Lexiscan Myovue    Orders Placed This Encounter  Procedures  . EKG 12-Lead     Disposition:   FU with me in a year    Patient is agreeable to this plan and will call if any problems develop in the interim.   Signed: Jenkins Rouge, MD  12/10/2017 11:59 AM  [facility] [address] [place], [place]  [address] Phone: [phone] Fax: [phone]

## 2017-12-08 ENCOUNTER — Other Ambulatory Visit: Payer: Self-pay

## 2017-12-10 ENCOUNTER — Encounter: Payer: Self-pay | Admitting: Cardiovascular Disease

## 2017-12-10 ENCOUNTER — Encounter

## 2017-12-10 ENCOUNTER — Ambulatory Visit (INDEPENDENT_AMBULATORY_CARE_PROVIDER_SITE_OTHER): Payer: Medicare Other | Admitting: Cardiovascular Disease

## 2017-12-10 VITALS — BP 128/66 | HR 80 | Ht 63.0 in | Wt 150.5 lb

## 2017-12-10 DIAGNOSIS — E785 Hyperlipidemia, unspecified: Secondary | ICD-10-CM | POA: Diagnosis not present

## 2017-12-10 DIAGNOSIS — I480 Paroxysmal atrial fibrillation: Secondary | ICD-10-CM | POA: Diagnosis not present

## 2017-12-10 DIAGNOSIS — C499 Malignant neoplasm of connective and soft tissue, unspecified: Secondary | ICD-10-CM | POA: Diagnosis not present

## 2017-12-10 DIAGNOSIS — Z85828 Personal history of other malignant neoplasm of skin: Secondary | ICD-10-CM | POA: Diagnosis not present

## 2017-12-10 DIAGNOSIS — R0989 Other specified symptoms and signs involving the circulatory and respiratory systems: Secondary | ICD-10-CM

## 2017-12-10 DIAGNOSIS — C44321 Squamous cell carcinoma of skin of nose: Secondary | ICD-10-CM | POA: Diagnosis not present

## 2017-12-10 DIAGNOSIS — R0600 Dyspnea, unspecified: Secondary | ICD-10-CM

## 2017-12-10 DIAGNOSIS — I2510 Atherosclerotic heart disease of native coronary artery without angina pectoris: Secondary | ICD-10-CM

## 2017-12-10 DIAGNOSIS — R002 Palpitations: Secondary | ICD-10-CM

## 2017-12-10 NOTE — Patient Instructions (Signed)
Medication Instructions:   If you need a refill on your cardiac medications before your next appointment, please call your pharmacy.   Lab work:  If you have labs (blood work) drawn today and your tests are completely normal, you will receive your results only by: [name] MyChart Message (if you have MyChart) OR . A paper copy in the mail If you have any lab test that is abnormal or we need to change your treatment, we will call you to review the results.  Testing/Procedures:   Follow-Up: At [facility], you and your health needs are our priority.  As part of our continuing mission to provide you with exceptional heart care, we have created designated Provider Care Teams.  These Care Teams include your primary Cardiologist (physician) and Advanced Practice Providers (APPs -  Physician Assistants and Nurse Practitioners) who all work together to provide you with the care you need, when you need it. You will need a follow up appointment in 1 years.  Please call our office 2 months in advance to schedule this appointment.  You may see Jenkins Rouge, MD or one of the following Advanced Practice Providers on your designated Care Team:   [name], NP Cecilie Kicks, NP . Kathyrn Drown, NP

## 2017-12-20 ENCOUNTER — Other Ambulatory Visit: Payer: Self-pay | Admitting: Internal Medicine

## 2017-12-24 ENCOUNTER — Ambulatory Visit (INDEPENDENT_AMBULATORY_CARE_PROVIDER_SITE_OTHER): Payer: Medicare Other

## 2017-12-24 DIAGNOSIS — M810 Age-related osteoporosis without current pathological fracture: Secondary | ICD-10-CM

## 2017-12-24 MED ORDER — DENOSUMAB 60 MG/ML ~~LOC~~ SOSY
60.0000 mg | PREFILLED_SYRINGE | Freq: Once | SUBCUTANEOUS | Status: AC
  Administered 2017-12-24: 60 mg via SUBCUTANEOUS

## 2017-12-31 DIAGNOSIS — R911 Solitary pulmonary nodule: Secondary | ICD-10-CM | POA: Diagnosis not present

## 2017-12-31 DIAGNOSIS — C760 Malignant neoplasm of head, face and neck: Secondary | ICD-10-CM | POA: Diagnosis not present

## 2017-12-31 DIAGNOSIS — R918 Other nonspecific abnormal finding of lung field: Secondary | ICD-10-CM | POA: Diagnosis not present

## 2017-12-31 DIAGNOSIS — C499 Malignant neoplasm of connective and soft tissue, unspecified: Secondary | ICD-10-CM | POA: Diagnosis not present

## 2017-12-31 DIAGNOSIS — I6522 Occlusion and stenosis of left carotid artery: Secondary | ICD-10-CM | POA: Diagnosis not present

## 2017-12-31 DIAGNOSIS — C490 Malignant neoplasm of connective and soft tissue of head, face and neck: Secondary | ICD-10-CM | POA: Diagnosis not present

## 2018-01-02 ENCOUNTER — Other Ambulatory Visit: Payer: Self-pay | Admitting: Internal Medicine

## 2018-01-06 ENCOUNTER — Other Ambulatory Visit: Payer: Self-pay | Admitting: Cardiovascular Disease

## 2018-01-07 ENCOUNTER — Telehealth: Payer: Self-pay | Admitting: Hematology

## 2018-01-07 NOTE — Telephone Encounter (Signed)
Patient stopped by to change 12/23 lab/inj to 12/20 due to going out of town to be with family. Ok per YF. Appointments moved to 12/20 and patient given updated schedule.

## 2018-01-09 ENCOUNTER — Inpatient Hospital Stay: Payer: Medicare Other

## 2018-01-09 ENCOUNTER — Inpatient Hospital Stay: Payer: Medicare Other | Attending: Hematology

## 2018-01-09 VITALS — BP 147/72 | HR 70 | Resp 16

## 2018-01-09 DIAGNOSIS — N184 Chronic kidney disease, stage 4 (severe): Secondary | ICD-10-CM | POA: Diagnosis not present

## 2018-01-09 DIAGNOSIS — N189 Chronic kidney disease, unspecified: Secondary | ICD-10-CM

## 2018-01-09 DIAGNOSIS — I129 Hypertensive chronic kidney disease with stage 1 through stage 4 chronic kidney disease, or unspecified chronic kidney disease: Secondary | ICD-10-CM | POA: Diagnosis not present

## 2018-01-09 DIAGNOSIS — D631 Anemia in chronic kidney disease: Secondary | ICD-10-CM | POA: Insufficient documentation

## 2018-01-09 LAB — CBC WITH DIFFERENTIAL/PLATELET
Abs Immature Granulocytes: 0.02 10*3/uL (ref 0.00–0.07)
BASOS ABS: 0 10*3/uL (ref 0.0–0.1)
Basophils Relative: 1 %
EOS ABS: 0.2 10*3/uL (ref 0.0–0.5)
EOS PCT: 3 %
HCT: 31.2 % — ABNORMAL LOW (ref 36.0–46.0)
HEMOGLOBIN: 10 g/dL — AB (ref 12.0–15.0)
IMMATURE GRANULOCYTES: 0 %
LYMPHS ABS: 0.7 10*3/uL (ref 0.7–4.0)
LYMPHS PCT: 15 %
MCH: 30.5 pg (ref 26.0–34.0)
MCHC: 32.1 g/dL (ref 30.0–36.0)
MCV: 95.1 fL (ref 80.0–100.0)
Monocytes Absolute: 0.4 10*3/uL (ref 0.1–1.0)
Monocytes Relative: 9 %
NEUTROS PCT: 72 %
NRBC: 0 % (ref 0.0–0.2)
Neutro Abs: 3.3 10*3/uL (ref 1.7–7.7)
Platelets: 240 10*3/uL (ref 150–400)
RBC: 3.28 MIL/uL — AB (ref 3.87–5.11)
RDW: 13.9 % (ref 11.5–15.5)
WBC: 4.6 10*3/uL (ref 4.0–10.5)

## 2018-01-09 MED ORDER — DARBEPOETIN ALFA 200 MCG/0.4ML IJ SOSY
200.0000 ug | PREFILLED_SYRINGE | Freq: Once | INTRAMUSCULAR | Status: AC
  Administered 2018-01-09: 200 ug via SUBCUTANEOUS

## 2018-01-09 MED ORDER — DARBEPOETIN ALFA 200 MCG/0.4ML IJ SOSY
PREFILLED_SYRINGE | INTRAMUSCULAR | Status: AC
  Filled 2018-01-09: qty 0.4

## 2018-01-12 ENCOUNTER — Inpatient Hospital Stay: Payer: Medicare Other

## 2018-01-14 ENCOUNTER — Other Ambulatory Visit: Payer: Self-pay | Admitting: Internal Medicine

## 2018-01-25 ENCOUNTER — Other Ambulatory Visit: Payer: Self-pay | Admitting: Internal Medicine

## 2018-01-28 IMAGING — DX DG CHEST 2V
2 series · 2 of 2 positions shown · non-contrast
Comparison: Chest x-ray of May 10, 2016 and chest CT scan of
October 20, 2016.

CLINICAL DATA: One week of cough. History of asthma, pneumonia,
coronary artery disease and CABG. Never smoked.

EXAM:
CHEST  2 VIEW

[chest pa]
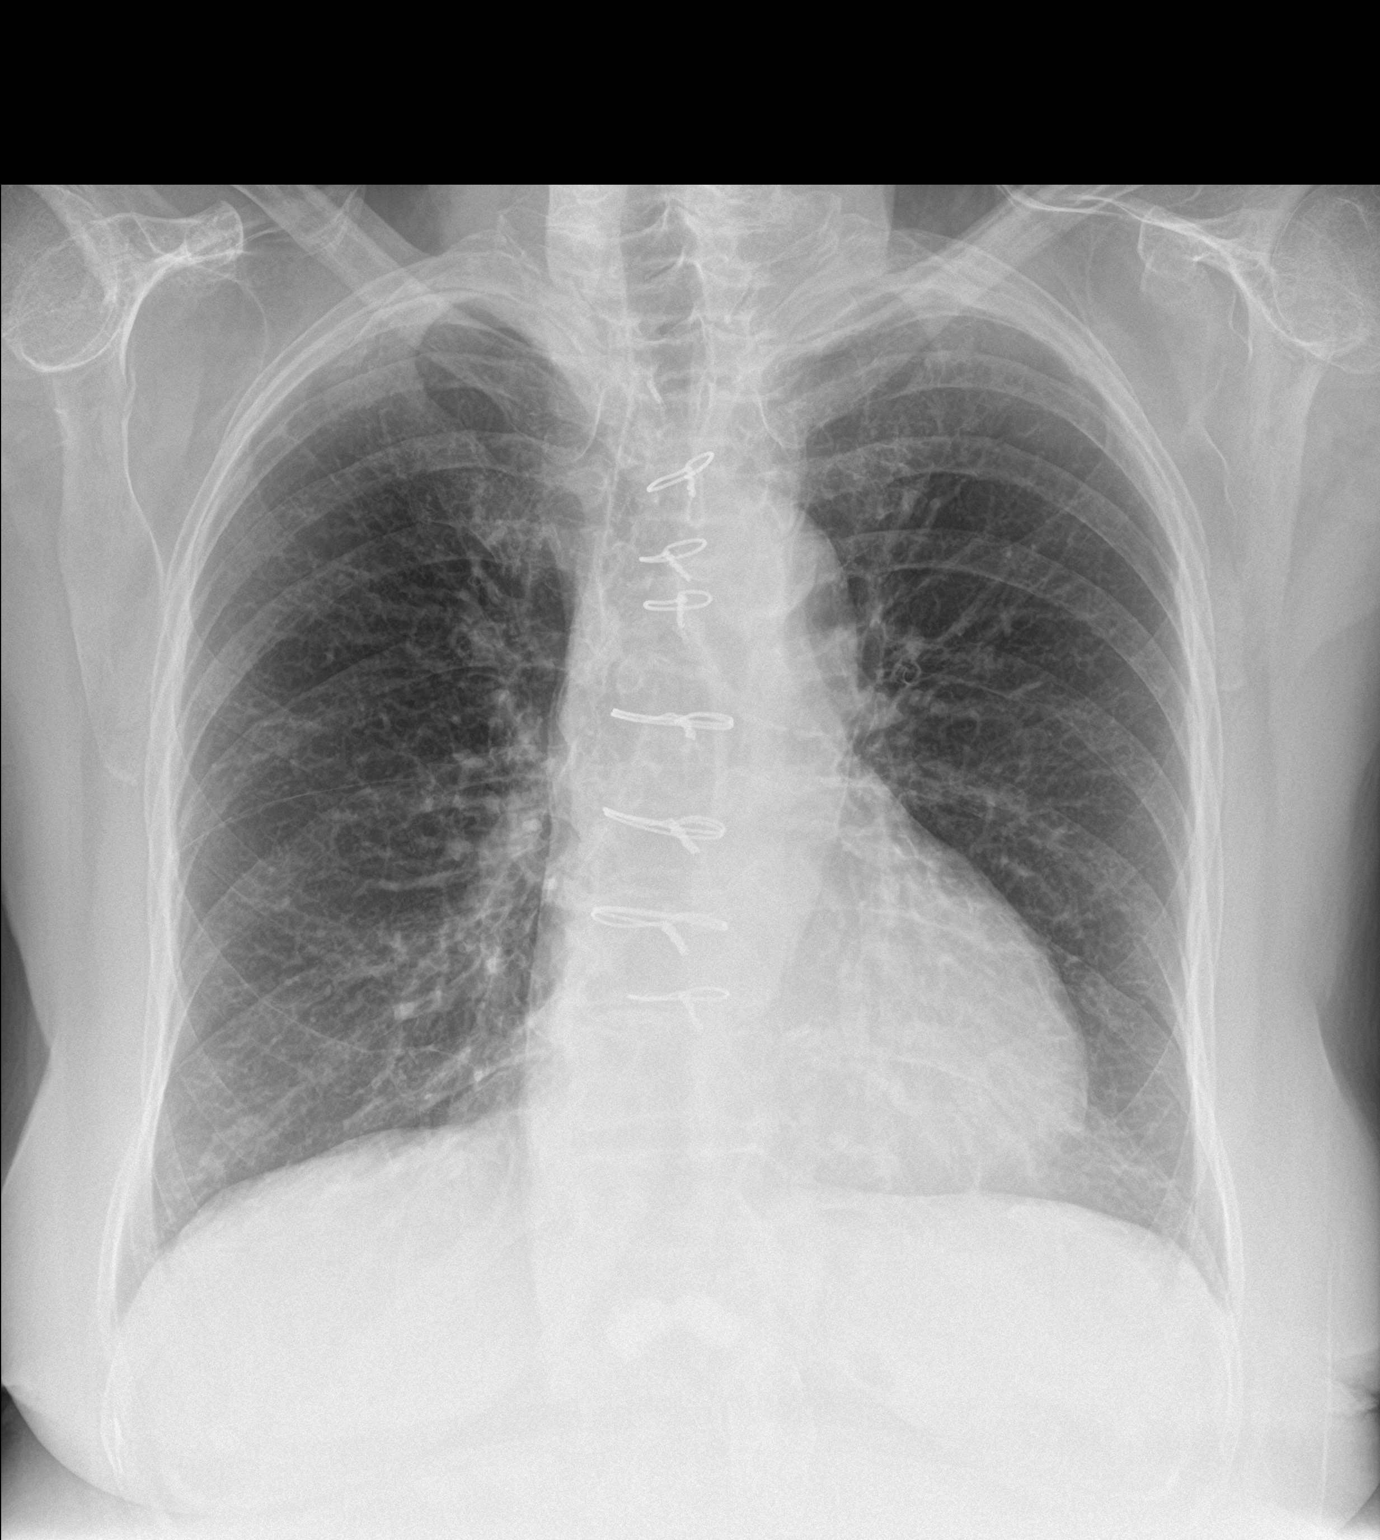

[chest lat]
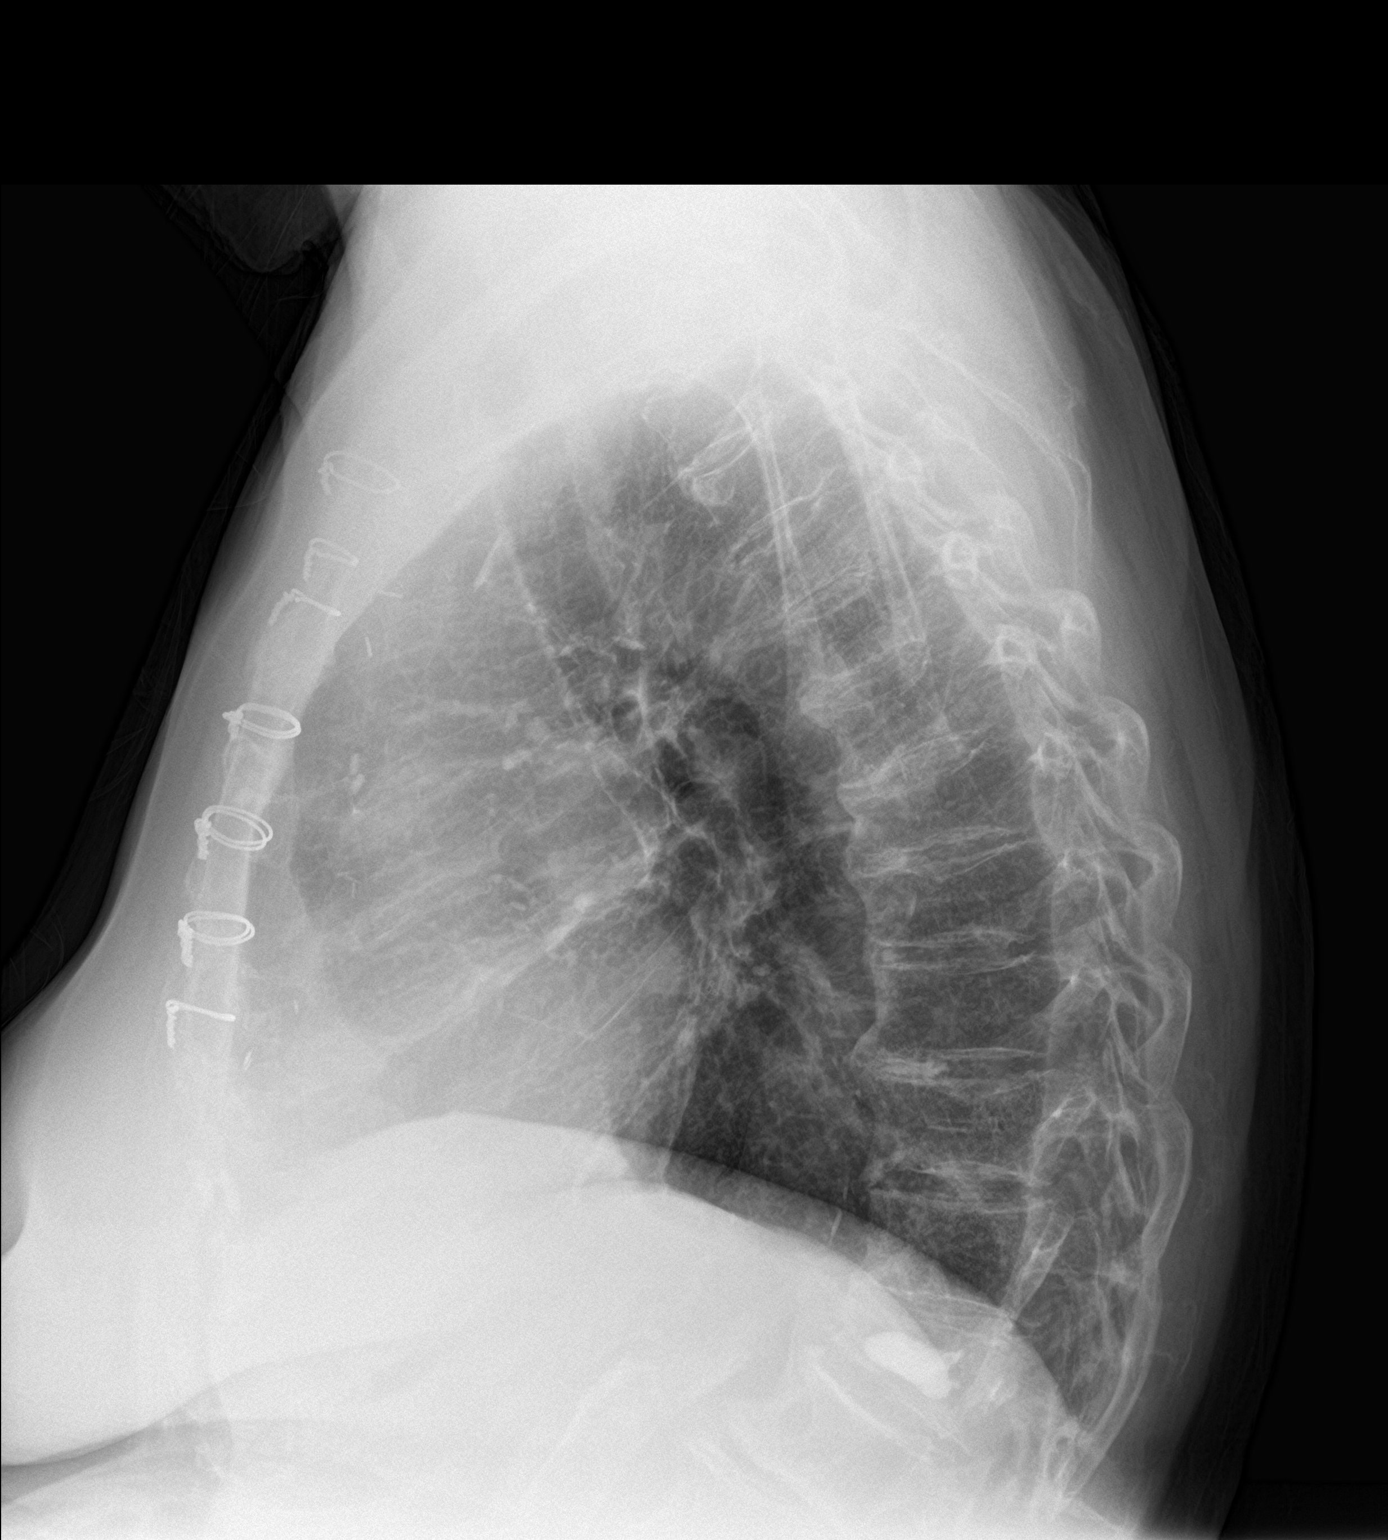

[2 of 2 positions shown; findings below may reference images not displayed]

FINDINGS: The lungs are adequately inflated. There is no focal infiltrate.
There is no pleural effusion. The heart and pulmonary vascularity
are normal. The mediastinum is normal in width. There is
calcification in the wall of the thoracic aorta. The sternal wires
are intact. There is prominent thoracic kyphosis centered on T12 or
L1 which has been treated with kyphoplasty.
IMPRESSION: Chronic bronchitic changes, stable. No pneumonia nor CHF. Previous
CABG.

Thoracic aortic atherosclerosis.

## 2018-01-30 ENCOUNTER — Telehealth: Payer: Self-pay | Admitting: Hematology

## 2018-01-30 NOTE — Telephone Encounter (Signed)
Patient came in to reschedule her appointments.

## 2018-02-28 ENCOUNTER — Other Ambulatory Visit: Payer: Self-pay | Admitting: Cardiovascular Disease

## 2018-03-02 NOTE — Telephone Encounter (Signed)
Lasix was on patient's last office visit on 12/10/17.

## 2018-03-10 DIAGNOSIS — L818 Other specified disorders of pigmentation: Secondary | ICD-10-CM | POA: Diagnosis not present

## 2018-03-10 DIAGNOSIS — Z85828 Personal history of other malignant neoplasm of skin: Secondary | ICD-10-CM | POA: Diagnosis not present

## 2018-03-11 ENCOUNTER — Telehealth: Payer: Self-pay | Admitting: Hematology

## 2018-03-11 DIAGNOSIS — C499 Malignant neoplasm of connective and soft tissue, unspecified: Secondary | ICD-10-CM | POA: Diagnosis not present

## 2018-03-11 DIAGNOSIS — C760 Malignant neoplasm of head, face and neck: Secondary | ICD-10-CM | POA: Diagnosis not present

## 2018-03-11 NOTE — Telephone Encounter (Signed)
R/s appt per 2/18 sch message - pt is aware of appt change

## 2018-03-13 ENCOUNTER — Other Ambulatory Visit: Payer: Self-pay | Admitting: Internal Medicine

## 2018-03-16 ENCOUNTER — Ambulatory Visit: Payer: Self-pay

## 2018-03-16 ENCOUNTER — Ambulatory Visit: Payer: Self-pay | Admitting: Hematology

## 2018-03-16 ENCOUNTER — Other Ambulatory Visit: Payer: Self-pay

## 2018-03-16 DIAGNOSIS — H04129 Dry eye syndrome of unspecified lacrimal gland: Secondary | ICD-10-CM | POA: Diagnosis not present

## 2018-03-16 DIAGNOSIS — Z961 Presence of intraocular lens: Secondary | ICD-10-CM | POA: Diagnosis not present

## 2018-03-16 NOTE — Progress Notes (Signed)
[name] [name]   [phone] [phone]   Clinic Follow up Note   Patient Care Team: Plotnikov, [name], MD as PCP - General (Internal Medicine) Josue Hector, MD as PCP - Cardiology (Cardiology) Josue Hector, MD as Consulting Physician (Cardiology) 03/17/2018  CHIEF COMPLAINT: F/u anemia   CURRENT THERAPY  Aranesp 200 mcg subcutaneous every 2 months for hemoglobin less than 11.  INTERVAL HISTORY: [name] [name] is a 85 y.o. female with a history of anemia secondary to renal insufficiency is here for follow-up. In November of 2019 she experienced a a several episodes of syncope. Today, she is here alone. Last Tuesday, she went to get a hair cut and her hairdresser noticed erythema and yellowness all around the top of her head. She then went to go see  Dr. Kateri Mc at [facility] last week and the erythema is likely due to radiation. She is starting radiation again on 04/08/2018. She has noticed some fatigue.   Pertinent positives and negatives of review of systems are listed and detailed within the above HPI.  REVIEW OF SYSTEMS:  Constitutional: Denies fevers, chills or abnormal weight loss, (+) fatigue  Eyes: Denies blurriness of vision Ears, nose, mouth, throat, and face: Denies mucositis or sore throat Respiratory: Denies cough, dyspnea or wheezes Cardiovascular: Denies palpitation, chest discomfort or lower extremity swelling Gastrointestinal:  Denies nausea, heartburn or change in bowel habits Skin: Denies abnormal skin rashes, (+) mild erythema on the top of her head  Lymphatics: Denies new lymphadenopathy or easy bruising Neurological:Denies numbness, tingling or new weaknesses Behavioral/Psych: Mood is stable, no new changes  All other systems were reviewed with the patient and are negative.  MEDICAL HISTORY:  Past Medical History:  Diagnosis Date  . Anemia, unspecified   . Angiosarcoma ([place])    dx in April   . Anxiety state, unspecified    . Cancer ([place])   . Depressive disorder, not elsewhere classified   . Diverticulosis of colon (without mention of hemorrhage)   . Diverticulosis of colon with hemorrhage   . Esophageal reflux   . History of echocardiogram    Echo 8/18: EF 55-60, normal wall motion, grade 2 diastolic dysfunction, mild AI, mild MR, PASP 32  . Lumbago   . Myocardial infarction ([place])   . Other and unspecified hyperlipidemia   . Persistent disorder of initiating or maintaining sleep   . Thyroid disease   . Unspecified asthma(493.90)   . Unspecified disorder resulting from impaired renal function   . Unspecified essential hypertension    Dr [name]  . Unspecified osteomyelitis, site unspecified   . Unspecified vitamin D deficiency     SURGICAL HISTORY: Past Surgical History:  Procedure Laterality Date  . BONE MARROW BIOPSY    . CORONARY ARTERY BYPASS GRAFT  03/25/13   x4. CABS ON PUMP-Surgeon Jomarie Longs.  [name] PARTIAL COLECTOMY  1995   diverticulosis of colon iwth hemorrhage.   . status post right carotid enterectomy      I have reviewed the social history and family history with the patient and they are unchanged from previous note.  ALLERGIES:  is allergic to codeine sulfate; darvon [propoxyphene]; propoxyphene napsylate; sulfacetamide sodium-sulfur; sulfamethoxazole-trimethoprim; and zolpidem.  MEDICATIONS:  Current Outpatient Medications  Medication Sig Dispense Refill  . acetaminophen (TYLENOL) 325 MG tablet Take 2 tablets (650 mg total) by mouth every 6 (six) hours as needed for mild pain (or Fever >/= 101). 30 tablet 0  . albuterol (PROVENTIL HFA;VENTOLIN HFA)  108 (90 Base) MCG/ACT inhaler Inhale 1-2 puffs into the lungs every 6 (six) hours as needed for wheezing or shortness of breath. 1 Inhaler 1  . allopurinol (ZYLOPRIM) 100 MG tablet TAKE 1 TABLET BY MOUTH EVERY DAY 90 tablet 1  . aspirin EC 81 MG tablet Take 1 tablet (81 mg total) by mouth daily. 90 tablet 3  . atorvastatin (LIPITOR) 40 MG  tablet Take 1 tablet (40 mg total) by mouth daily at 6 PM. -- Office visit needed for further refills 90 tablet 0  . b complex vitamins capsule Take 1 capsule by mouth daily.    . Biotin 5000 MCG CAPS Take by mouth 1 day or 1 dose.    . Cholecalciferol (EQL VITAMIN D3) 1000 UNITS tablet Take 1 tablet (1,000 Units total) by mouth daily. 100 tablet 3  . darbepoetin (ARANESP) 100 MCG/0.5ML SOLN injection Inject 100 mcg into the skin every 8 (eight) weeks.    [name] escitalopram (LEXAPRO) 5 MG tablet TAKE 1 TABLET BY MOUTH EVERY DAY 30 tablet 0  . furosemide (LASIX) 20 MG tablet TAKE 1 TABLET BY MOUTH EVERY DAY 90 tablet 3  . levothyroxine (SYNTHROID, LEVOTHROID) 50 MCG tablet Take 1 tablet (50 mcg total) by mouth daily before breakfast. -- Office visit needed for further refills 90 tablet 0  . metoprolol tartrate (LOPRESSOR) 25 MG tablet TAKE 1/2 TABLET BY MOUTH TWICE A DAY 90 tablet 3  . pantoprazole (PROTONIX) 40 MG tablet Take 1 tablet (40 mg total) by mouth daily as needed. 90 tablet 2  . polyethylene glycol powder (GLYCOLAX/MIRALAX) powder Take 17 g by mouth 2 (two) times daily as needed for mild constipation or moderate constipation. 500 g 3  . triamcinolone cream (KENALOG) 0.1 % Apply 1 application topically 2 (two) times daily. 45 g 1  . budesonide-formoterol (SYMBICORT) 160-4.5 MCG/ACT inhaler Inhale 2 puffs into the lungs 2 (two) times daily. 1 Inhaler 3   No current facility-administered medications for this visit.    Facility-Administered Medications Ordered in Other Visits  Medication Dose Route Frequency Provider Last Rate Last Dose  . Darbepoetin Alfa (ARANESP) injection 200 mcg  200 mcg Subcutaneous Once [name], MD      . Darbepoetin Alfa Kyra Searles) injection 500 mcg  500 mcg Subcutaneous Once [name], MD        PHYSICAL EXAMINATION: ECOG PERFORMANCE STATUS: 1 - Symptomatic but completely ambulatory  Vitals:   03/17/18 1323  BP: (!) 160/73  Pulse: 67  Resp: 18  Temp: 98.2 F  (36.8 C)  SpO2: 99%   Filed Weights   03/17/18 1323  Weight: 149 lb 6.4 oz (67.8 kg)    GENERAL:alert, no distress and comfortable SKIN: skin color, texture, turgor are normal, no rashes or significant lesions EYES: normal, Conjunctiva are pink and non-injected, sclera clear OROPHARYNX:no exudate, no erythema and lips, buccal mucosa, and tongue normal  NECK: supple, thyroid normal size, non-tender, without nodularity LYMPH:  no palpable lymphadenopathy in the cervical, axillary or inguinal LUNGS: clear to auscultation and percussion with normal breathing effort HEART: regular rate & rhythm and no murmurs and no lower extremity edema ABDOMEN:abdomen soft, non-tender and normal bowel sounds Musculoskeletal:no cyanosis of digits and no clubbing  NEURO: alert & oriented x 3 with fluent speech, no focal motor/sensory deficits  LABORATORY DATA:  I have reviewed the data as listed CBC Latest Ref Rng & Units 03/17/2018 01/09/2018 11/11/2017  WBC 4.0 - 10.5 K/uL 4.5 4.6 5.4  Hemoglobin 12.0 - 15.0  g/dL 9.9(L) 10.0(L) 10.4(L)  Hematocrit 36.0 - 46.0 % 30.0(L) 31.2(L) 31.7(L)  Platelets 150 - 400 K/uL 229 240 222     CMP Latest Ref Rng & Units 11/07/2017 03/20/2017 01/17/2017  Glucose 70 - 99 mg/dL 95 84 90  BUN 6 - 23 mg/dL 33(H) 40(H) 35.5(H)  Creatinine 0.40 - 1.20 mg/dL 1.93(H) 2.01(H) 1.9(H)  Sodium 135 - 145 mEq/L 137 140 139  Potassium 3.5 - 5.1 mEq/L 3.6 3.9 4.1  Chloride 96 - 112 mEq/L 98 104 -  CO2 19 - 32 mEq/L _0 Calcium 8.4 - 10.5 mg/dL 9.6 9.2 9.3  Total Protein 6.0 - 8.3 g/dL 7.3 6.6 6.7  Total Bilirubin 0.2 - 1.2 mg/dL 0.6 0.6 0.55  Alkaline Phos 39 - 117 U/L 77 100 107  AST 0 - 37 U/L _1 ALT 0 - 35 U/L _2 RADIOGRAPHIC STUDIES: I have personally reviewed the radiological images as listed and agreed with the findings in the report. No results found.   ASSESSMENT & PLAN:  BRYANT LIPPS is a 85 y.o. female with history of  1. Anemia of  chronic disease (CKD).    -She was able to maintain her hemoglobin well with Aranesp every 3 months. However she previously complained about fatigue, and would like to go back to Aranesp injection every 2 months.  -We again reviewed the benefit and the risks of Aranesp injection, especially the risk of thrombosis, including heart attack and stroke. She voiced good understanding.  -Will check iron level every year when she is on Aranesp - Labs reviewed, Hg 9.9, overall stable  -continue lab and injection every 2 months if Hb<11.0 -will monitor her iron level  -F/u in 6 months   2. Chronic kidney disease.  -Avoid nephrotoxins and previously counseled on continued adequate hydration.   - I advised the patient that she could probably discontinue Lasix if she is no longer experiencing lower extremity edema. This could be contributing to her decreased kidney function. I will consult with Dr. Alain Marion on this, and encouraged the patient to discuss this with him as well when she sees him next.  3. Gout  -I previously encouraged her to follow-up with her primary care physician, and discussed if she would benefit from allopurinol to prevent gout flare -Her physician related her gout to hereditary trait. She watches what she eats as this can effect her gout flaring.    4. CAD, CHF, HTN, ARTHRITIS  -F/u with cardiologist   5. Hypertension -Encouraged the patient to check blood pressure regularly at home - F/u PCP  6. Osteoporosis - I previously reviewed the patient's history of hip fracture and many compression fractures in the lumbar spine. - She is not on any medication for osteoporosis and has never been - On Prolia  - F/u PCP   7. Angiosarcoma of scalp  -s/p radiation by Dr. Kateri Mc at [facility] -f/u with Dr. Kateri Mc    Plan  -Labs reviewed. Aranesp injection today and every 2 months with labs, If Hb<11.0 - Follow up in 6 months.  No problem-specific Assessment & Plan notes found  for this encounter.   No orders of the defined types were placed in this encounter.  All questions were answered. The patient knows to call the clinic with any problems, questions or concerns. No barriers to learning was detected. I spent 15 minutes counseling the patient face to face. The total time spent in  the appointment was 20 minutes and more than 50% was on counseling and review of test results  I, Manson Allan am acting as scribe for Dr. [name].  I have reviewed the above documentation for accuracy and completeness, and I agree with the above.     [name], MD 03/17/2018

## 2018-03-17 ENCOUNTER — Inpatient Hospital Stay: Payer: Medicare Other | Admitting: Hematology

## 2018-03-17 ENCOUNTER — Inpatient Hospital Stay: Payer: Medicare Other

## 2018-03-17 ENCOUNTER — Inpatient Hospital Stay (HOSPITAL_BASED_OUTPATIENT_CLINIC_OR_DEPARTMENT_OTHER): Payer: Medicare Other | Admitting: Hematology

## 2018-03-17 ENCOUNTER — Telehealth: Payer: Self-pay | Admitting: Hematology

## 2018-03-17 ENCOUNTER — Encounter: Payer: Self-pay | Admitting: Hematology

## 2018-03-17 ENCOUNTER — Inpatient Hospital Stay: Payer: Medicare Other | Attending: Hematology

## 2018-03-17 VITALS — BP 160/73 | HR 67 | Temp 98.2°F | Resp 18 | Ht 63.0 in | Wt 149.4 lb

## 2018-03-17 VITALS — BP 161/64

## 2018-03-17 DIAGNOSIS — N189 Chronic kidney disease, unspecified: Secondary | ICD-10-CM

## 2018-03-17 DIAGNOSIS — D631 Anemia in chronic kidney disease: Secondary | ICD-10-CM

## 2018-03-17 DIAGNOSIS — N184 Chronic kidney disease, stage 4 (severe): Secondary | ICD-10-CM | POA: Diagnosis not present

## 2018-03-17 LAB — CBC WITH DIFFERENTIAL/PLATELET
Abs Immature Granulocytes: 0.01 10*3/uL (ref 0.00–0.07)
BASOS PCT: 1 %
Basophils Absolute: 0 10*3/uL (ref 0.0–0.1)
EOS ABS: 0.2 10*3/uL (ref 0.0–0.5)
Eosinophils Relative: 4 %
HCT: 30 % — ABNORMAL LOW (ref 36.0–46.0)
Hemoglobin: 9.9 g/dL — ABNORMAL LOW (ref 12.0–15.0)
Immature Granulocytes: 0 %
Lymphocytes Relative: 18 %
Lymphs Abs: 0.8 10*3/uL (ref 0.7–4.0)
MCH: 31.3 pg (ref 26.0–34.0)
MCHC: 33 g/dL (ref 30.0–36.0)
MCV: 94.9 fL (ref 80.0–100.0)
MONOS PCT: 11 %
Monocytes Absolute: 0.5 10*3/uL (ref 0.1–1.0)
NRBC: 0 % (ref 0.0–0.2)
Neutro Abs: 3 10*3/uL (ref 1.7–7.7)
Neutrophils Relative %: 66 %
PLATELETS: 229 10*3/uL (ref 150–400)
RBC: 3.16 MIL/uL — AB (ref 3.87–5.11)
RDW: 13.4 % (ref 11.5–15.5)
WBC: 4.5 10*3/uL (ref 4.0–10.5)

## 2018-03-17 MED ORDER — DARBEPOETIN ALFA 200 MCG/0.4ML IJ SOSY
PREFILLED_SYRINGE | INTRAMUSCULAR | Status: AC
  Filled 2018-03-17: qty 0.4

## 2018-03-17 MED ORDER — DARBEPOETIN ALFA 200 MCG/0.4ML IJ SOSY
200.0000 ug | PREFILLED_SYRINGE | Freq: Once | INTRAMUSCULAR | Status: AC
  Administered 2018-03-17: 200 ug via SUBCUTANEOUS

## 2018-03-17 NOTE — Telephone Encounter (Signed)
Scheduled appt per 02/25 los.  Printed and mailed calendar.

## 2018-03-17 NOTE — Patient Instructions (Signed)

## 2018-03-18 ENCOUNTER — Encounter: Payer: Self-pay | Admitting: Hematology

## 2018-03-27 ENCOUNTER — Telehealth: Payer: Self-pay | Admitting: Internal Medicine

## 2018-03-27 NOTE — Telephone Encounter (Signed)
Copied from [place] [id number]. Topic: Quick Communication - See Telephone Encounter >> Mar 27, 2018 11:32 AM Blase Mess A wrote: CRM for notification. See Telephone encounter for: 03/27/18.  Patient is calling to schedule her AWV with Emelia Loron. Please advise

## 2018-03-30 ENCOUNTER — Other Ambulatory Visit: Payer: Self-pay | Admitting: Internal Medicine

## 2018-04-01 NOTE — Progress Notes (Addendum)
 Subjective:   [name] [name] is a 85 y.o. female who presents for Medicare Annual (Subsequent) preventive examination.  Review of Systems:  No ROS.  Medicare Wellness Visit. Additional risk factors are reflected in the social history.  Cardiac Risk Factors include: advanced age (>55men, >65 women);dyslipidemia;hypertension Sleep patterns: gets up 1-2 times nightly to void and sleeps 6-7 hours nightly.    Home Safety/Smoke Alarms: Feels safe in home. Smoke alarms in place.  Living environment; residence and Firearm Safety: 2-story house, equipment: Walkers, Type: Rolling Walker and Hygiene Aides, Type: Tub Seat/Chair. Lives alone, no needs for DME, good support system Seat Belt Safety/Bike Helmet: Wears seat belt.      Objective:     Vitals: BP 136/68   Pulse 68   Temp 98.3 F (36.8 C)   Resp 17   Ht 5' 3" (1.6 m)   Wt 148 lb (67.1 kg)   SpO2 98%   BMI 26.22 kg/m   Body mass index is 26.22 kg/m.  Advanced Directives 04/02/2018 12/24/2016 05/13/2016 01/16/2016 01/12/2016 01/07/2016 01/07/2016  Does Patient Have a Medical Advance Directive? Yes Yes Yes Yes Yes Yes No  Type of Advance Directive Healthcare Power of Attorney;Living will Healthcare Power of Attorney;Living will - Healthcare Power of Attorney;Living will - Healthcare Power of Attorney -  Does patient want to make changes to medical advance directive? - - - - No - Patient declined No - Patient declined -  Copy of Healthcare Power of Attorney in Chart? No - copy requested No - copy requested Yes Yes Yes No - copy requested -  Would patient like information on creating a medical advance directive? - - - - No - Patient declined No - Patient declined No - Patient declined  Pre-existing out of facility DNR order (yellow form or pink MOST form) - - - - - - -    Tobacco Social History   Tobacco Use  Smoking Status Never Smoker  Smokeless Tobacco Never Used     Counseling given: Not Answered  Past Medical History:   Diagnosis Date  . Anemia, unspecified   . 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 Anxiety state, unspecified   . Cancer (HCC)   . Depressive disorder, not elsewhere classified   . Diverticulosis of colon (without mention of hemorrhage)   . Diverticulosis of colon with hemorrhage   . Esophageal reflux   . History of echocardiogram    Echo 8/18: EF 55-60, normal wall motion, grade 2 diastolic dysfunction, mild AI, mild MR, PASP 32  . Lumbago   . Myocardial infarction (HCC)   . Other and unspecified hyperlipidemia   . Persistent disorder of initiating or maintaining sleep   . Thyroid disease   . Unspecified asthma(493.90)   . Unspecified disorder resulting from impaired renal function   . Unspecified essential hypertension    Dr Nishan  . Unspecified osteomyelitis, site unspecified   . Unspecified vitamin D deficiency    Past Surgical History:  Procedure Laterality Date  . BONE MARROW BIOPSY    . CORONARY ARTERY BYPASS GRAFT  03/25/13   x4. CABS ON PUMP-Surgeon Neal Kon.  . PARTIAL COLECTOMY  1995   diverticulosis of colon iwth hemorrhage.   . status post right carotid enterectomy     Family History  Problem Relation Age of Onset  . Hypertension Other   . COPD Mother   . Cancer Father        lymphoma  .   Breast cancer Cousin   . Breast cancer Cousin    Social History   Socioeconomic History  . Marital status: Divorced    Spouse name: Not on file  . Number of children: 2  . Years of education: Not on file  . Highest education level: Not on file  Occupational History  . Occupation: retired  Social Needs  . Financial resource strain: Not hard at all  . Food insecurity:    Worry: Never true    Inability: Never true  . Transportation needs:    Medical: No    Non-medical: No  Tobacco Use  . Smoking status: Never Smoker  . Smokeless tobacco: Never Used  Substance and Sexual Activity  . Alcohol use: Yes    Alcohol/week: 1.0 - 2.0 standard drinks    Types: 1 - 2  Shots of liquor per week  . Drug use: No  . Sexual activity: Never  Lifestyle  . Physical activity:    Days per week: 0 days    Minutes per session: 0 min  . Stress: Only a little  Relationships  . Social connections:    Talks on phone: More than three times a week    Gets together: More than three times a week    Attends religious service: More than 4 times per year    Active member of club or organization: Yes    Attends meetings of clubs or organizations: More than 4 times per year    Relationship status: Divorced  Other Topics Concern  . Not on file  Social History Narrative   Alcohol 1-2 drinks  Of liquor weekly   POA       Outpatient Encounter Medications as of 04/02/2018  Medication Sig  . acetaminophen (TYLENOL) 325 MG tablet Take 2 tablets (650 mg total) by mouth every 6 (six) hours as needed for mild pain (or Fever >/= 101).  . albuterol (PROVENTIL HFA;VENTOLIN HFA) 108 (90 Base) MCG/ACT inhaler Inhale 1-2 puffs into the lungs every 6 (six) hours as needed for wheezing or shortness of breath.  . allopurinol (ZYLOPRIM) 100 MG tablet TAKE 1 TABLET BY MOUTH EVERY DAY  . aspirin EC 81 MG tablet Take 1 tablet (81 mg total) by mouth daily.  . atorvastatin (LIPITOR) 40 MG tablet Take 1 tablet (40 mg total) by mouth daily at 6 PM. -- Office visit needed for further refills  . b complex vitamins capsule Take 1 capsule by mouth daily.  . Biotin 5000 MCG CAPS Take by mouth 1 day or 1 dose.  . Cholecalciferol (EQL VITAMIN D3) 1000 UNITS tablet Take 1 tablet (1,000 Units total) by mouth daily.  . darbepoetin (ARANESP) 100 MCG/0.5ML SOLN injection Inject 100 mcg into the skin every 8 (eight) weeks.  . escitalopram (LEXAPRO) 5 MG tablet TAKE 1 TABLET BY MOUTH EVERY DAY  . furosemide (LASIX) 20 MG tablet TAKE 1 TABLET BY MOUTH EVERY DAY  . levothyroxine (SYNTHROID, LEVOTHROID) 50 MCG tablet Take 1 tablet (50 mcg total) by mouth daily before breakfast. Must keep appt on 04/02/18  .  metoprolol tartrate (LOPRESSOR) 25 MG tablet TAKE 1/2 TABLET BY MOUTH TWICE A DAY  . pantoprazole (PROTONIX) 40 MG tablet Take 1 tablet (40 mg total) by mouth daily as needed.  . polyethylene glycol powder (GLYCOLAX/MIRALAX) powder Take 17 g by mouth 2 (two) times daily as needed for mild constipation or moderate constipation.  . triamcinolone cream (KENALOG) 0.1 % Apply 1 application topically 2 (two) times daily.  .   budesonide-formoterol (SYMBICORT) 160-4.5 MCG/ACT inhaler Inhale 2 puffs into the lungs 2 (two) times daily.   Facility-Administered Encounter Medications as of 04/02/2018  Medication  . Darbepoetin Alfa (ARANESP) injection 200 mcg  . Darbepoetin Alfa (ARANESP) injection 500 mcg    Activities of Daily Living In your present state of health, do you have any difficulty performing the following activities: 04/02/2018  Hearing? N  Vision? N  Difficulty concentrating or making decisions? N  Walking or climbing stairs? N  Dressing or bathing? N  Doing errands, shopping? N  Preparing Food and eating ? N  Using the Toilet? N  In the past six months, have you accidently leaked urine? N  Do you have problems with loss of bowel control? N  Managing your Medications? N  Managing your Finances? N  Housekeeping or managing your Housekeeping? N  Some recent data might be hidden    Patient Care Team: Plotnikov, Aleksei V, MD as PCP - General (Internal Medicine) Nishan, Peter C, MD as PCP - Cardiology (Cardiology) Nishan, Peter C, MD as Consulting Physician (Cardiology) Larrier, Nicole, MD (Radiology) Feng, Yan, MD as Consulting Physician (Hematology)    Assessment:   This is a routine wellness examination for [name] [name]. Physical assessment deferred to PCP.  Exercise Activities and Dietary recommendations Current Exercise Habits: The patient does not participate in regular exercise at present  Diet (meal preparation, eat out, water intake, caffeinated beverages, dairy products,  fruits and vegetables): in general, a "healthy" diet  , on average, 2 meals per day  Reports decreased appetite and weight loss.  Discussed supplementing with Ensure, samples and coupons provided. Encouraged patient to increase daily water and healthy fluid intake.  Goals    . patient     Will review information on low purine foods;  Will also watch sodium in diet      . Patient Stated     Stay as healthy and as independent as possible    . Patient Stated     Maintain current health status and stay as healthy and as independent as possible.       Fall Risk Fall Risk  04/02/2018 12/08/2017 12/24/2016 09/24/2016 08/09/2016  Falls in the past year? 0 1 Yes Yes Yes  Comment - Emmi Telephone Survey: data to providers prior to load - - Emmi Telephone Survey: data to providers prior to load  Number falls in past yr: 0 1 1 2 or more 2 or more  Comment - Emmi Telephone Survey Actual Response = 1 - - Emmi Telephone Survey Actual Response = 2  Injury with Fall? - 0 Yes Yes Yes  Risk Factor Category  - - - - -  Risk for fall due to : Impaired balance/gait;Impaired mobility - - - -  Follow up Falls prevention discussed - Falls prevention discussed - -    Depression Screen PHQ 2/9 Scores 04/02/2018 12/24/2016 09/24/2016 06/07/2015  PHQ - 2 Score 2 2 3 0  PHQ- 9 Score 6 4 7 -     Cognitive Function MMSE - Mini Mental State Exam 04/02/2018 12/24/2016  Orientation to time 5 5  Orientation to Place 5 5  Registration 3 3  Attention/ Calculation 5 4  Recall 1 1  Language- name 2 objects 2 2  Language- repeat 1 1  Language- follow 3 step command 3 3  Language- read & follow direction 1 1  Write a sentence 1 1  Copy design 1 1  Total score 28 27          Immunization History  Administered Date(s) Administered  . H1N1 12/28/2007  . Influenza Split 10/22/2011  . Influenza, High Dose Seasonal PF 10/09/2015, 09/24/2016, 11/07/2017  . Influenza,inj,Quad PF,6+ Mos 10/01/2013, 09/14/2014  .  Influenza-Unspecified 11/04/2012  . Pneumococcal Conjugate-13 12/19/2014  . Pneumococcal Polysaccharide-23 07/01/2012   Screening Tests Health Maintenance  Topic Date Due  . TETANUS/TDAP  04/02/2019 (Originally 04/09/1950)  . INFLUENZA VACCINE  Completed  . DEXA SCAN  Completed  . PNA vac Low Risk Adult  Completed      Plan:   Reviewed health maintenance screenings with patient today and relevant education, vaccines, and/or referrals were provided.   Continue doing brain stimulating activities (puzzles, reading, adult coloring books, staying active) to keep memory sharp.   Continue to eat heart healthy diet (full of fruits, vegetables, whole grains, lean protein, water--limit salt, fat, and sugar intake) and increase physical activity as tolerated.  I have personally reviewed and noted the following in the patient's chart:   . Medical and social history . Use of alcohol, tobacco or illicit drugs  . Current medications and supplements . Functional ability and status . Nutritional status . Physical activity . Advanced directives . List of other physicians . Vitals . Screenings to include cognitive, depression, and falls . Referrals and appointments  In addition, I have reviewed and discussed with patient certain preventive protocols, quality metrics, and best practice recommendations. A written personalized care plan for preventive services as well as general preventive health recommendations were provided to patient.     Michiel Cowboy, RN  04/02/2018  Medical screening examination/treatment/procedure(s) were performed by non-physician practitioner and as supervising physician I was immediately available for consultation/collaboration. I agree with above. Lew Dawes, MD

## 2018-04-02 ENCOUNTER — Ambulatory Visit (INDEPENDENT_AMBULATORY_CARE_PROVIDER_SITE_OTHER): Payer: Medicare Other | Admitting: Internal Medicine

## 2018-04-02 ENCOUNTER — Ambulatory Visit (INDEPENDENT_AMBULATORY_CARE_PROVIDER_SITE_OTHER): Payer: Medicare Other | Admitting: *Deleted

## 2018-04-02 ENCOUNTER — Encounter: Payer: Self-pay | Admitting: Internal Medicine

## 2018-04-02 ENCOUNTER — Other Ambulatory Visit: Payer: Self-pay

## 2018-04-02 VITALS — BP 136/68 | HR 68 | Temp 98.3°F | Resp 17 | Ht 63.0 in | Wt 148.0 lb

## 2018-04-02 DIAGNOSIS — Z0000 Encounter for general adult medical examination without abnormal findings: Secondary | ICD-10-CM

## 2018-04-02 DIAGNOSIS — C490 Malignant neoplasm of connective and soft tissue of head, face and neck: Secondary | ICD-10-CM | POA: Diagnosis not present

## 2018-04-02 DIAGNOSIS — L600 Ingrowing nail: Secondary | ICD-10-CM | POA: Diagnosis not present

## 2018-04-02 DIAGNOSIS — E559 Vitamin D deficiency, unspecified: Secondary | ICD-10-CM | POA: Diagnosis not present

## 2018-04-02 DIAGNOSIS — M109 Gout, unspecified: Secondary | ICD-10-CM

## 2018-04-02 NOTE — Assessment & Plan Note (Signed)
Vit D 

## 2018-04-02 NOTE — Progress Notes (Signed)
Subjective:  Patient ID: [name] [name], female    DOB: 1931-08-07  Age: 85 y.o. MRN: [id number]  CC: No chief complaint on file.   HPI [name] [name] presents for alopecia - new s/p chemo; HTN, CAD f/u C/o L big toe pain>R toe x long tme  Outpatient Medications Prior to Visit  Medication Sig Dispense Refill  . acetaminophen (TYLENOL) 325 MG tablet Take 2 tablets (650 mg total) by mouth every 6 (six) hours as needed for mild pain (or Fever >/= 101). 30 tablet 0  . albuterol (PROVENTIL HFA;VENTOLIN HFA) 108 (90 Base) MCG/ACT inhaler Inhale 1-2 puffs into the lungs every 6 (six) hours as needed for wheezing or shortness of breath. 1 Inhaler 1  . allopurinol (ZYLOPRIM) 100 MG tablet TAKE 1 TABLET BY MOUTH EVERY DAY 90 tablet 1  . aspirin EC 81 MG tablet Take 1 tablet (81 mg total) by mouth daily. 90 tablet 3  . atorvastatin (LIPITOR) 40 MG tablet Take 1 tablet (40 mg total) by mouth daily at 6 PM. -- Office visit needed for further refills 90 tablet 0  . b complex vitamins capsule Take 1 capsule by mouth daily.    . Biotin 5000 MCG CAPS Take by mouth 1 day or 1 dose.    . Cholecalciferol (EQL VITAMIN D3) 1000 UNITS tablet Take 1 tablet (1,000 Units total) by mouth daily. 100 tablet 3  . darbepoetin (ARANESP) 100 MCG/0.5ML SOLN injection Inject 100 mcg into the skin every 8 (eight) weeks.    [name] escitalopram (LEXAPRO) 5 MG tablet TAKE 1 TABLET BY MOUTH EVERY DAY 30 tablet 0  . furosemide (LASIX) 20 MG tablet TAKE 1 TABLET BY MOUTH EVERY DAY 90 tablet 3  . levothyroxine (SYNTHROID, LEVOTHROID) 50 MCG tablet Take 1 tablet (50 mcg total) by mouth daily before breakfast. Must keep appt on 04/02/18 90 tablet 0  . metoprolol tartrate (LOPRESSOR) 25 MG tablet TAKE 1/2 TABLET BY MOUTH TWICE A DAY 90 tablet 3  . pantoprazole (PROTONIX) 40 MG tablet Take 1 tablet (40 mg total) by mouth daily as needed. 90 tablet 2  . polyethylene glycol powder (GLYCOLAX/MIRALAX) powder Take 17 g by mouth 2 (two) times  daily as needed for mild constipation or moderate constipation. 500 g 3  . triamcinolone cream (KENALOG) 0.1 % Apply 1 application topically 2 (two) times daily. 45 g 1  . budesonide-formoterol (SYMBICORT) 160-4.5 MCG/ACT inhaler Inhale 2 puffs into the lungs 2 (two) times daily. 1 Inhaler 3   Facility-Administered Medications Prior to Visit  Medication Dose Route Frequency Provider Last Rate Last Dose  . Darbepoetin Alfa (ARANESP) injection 200 mcg  200 mcg Subcutaneous Once [name], MD      . Darbepoetin Alfa Kyra Searles) injection 500 mcg  500 mcg Subcutaneous Once [name], MD        ROS: Review of Systems  Constitutional: Positive for fatigue. Negative for activity change, appetite change, chills and unexpected weight change.  HENT: Negative for congestion, mouth sores and sinus pressure.   Eyes: Negative for visual disturbance.  Respiratory: Negative for cough and chest tightness.   Gastrointestinal: Negative for abdominal pain and nausea.  Genitourinary: Negative for difficulty urinating, frequency and vaginal pain.  Musculoskeletal: Positive for arthralgias and gait problem. Negative for back pain.  Skin: Negative for pallor and rash.  Neurological: Negative for dizziness, tremors, weakness, numbness and headaches.  Psychiatric/Behavioral: Negative for confusion, sleep disturbance and suicidal ideas.    Objective:  BP 136/68  Pulse 68   Temp 98.3 F (36.8 C) (Oral)   Ht 5\' 3"  (1.6 m)   Wt 148 lb (67.1 kg)   SpO2 98%   BMI 26.22 kg/m   BP Readings from Last 3 Encounters:  04/02/18 136/68  04/02/18 136/68  03/17/18 (!) 161/64    Wt Readings from Last 3 Encounters:  04/02/18 148 lb (67.1 kg)  04/02/18 148 lb (67.1 kg)  03/17/18 149 lb 6.4 oz (67.8 kg)    Physical Exam Constitutional:      General: She is not in acute distress.    Appearance: She is well-developed.  HENT:     Head: Normocephalic.     Right Ear: External ear normal.     Left Ear: External ear  normal.     Nose: Nose normal.  Eyes:     General:        Right eye: No discharge.        Left eye: No discharge.     Conjunctiva/sclera: Conjunctivae normal.     Pupils: Pupils are equal, round, and reactive to light.  Neck:     Musculoskeletal: Normal range of motion and neck supple.     Thyroid: No thyromegaly.     Vascular: No JVD.     Trachea: No tracheal deviation.  Cardiovascular:     Rate and Rhythm: Normal rate and regular rhythm.     Heart sounds: Normal heart sounds.  Pulmonary:     Effort: No respiratory distress.     Breath sounds: No stridor. No wheezing.  Abdominal:     General: Bowel sounds are normal. There is no distension.     Palpations: Abdomen is soft. There is no mass.     Tenderness: There is no abdominal tenderness. There is no guarding or rebound.  Musculoskeletal:        General: No tenderness.  Lymphadenopathy:     Cervical: No cervical adenopathy.  Skin:    Findings: No erythema or rash.  Neurological:     Mental Status: She is oriented to person, place, and time.     Cranial Nerves: No cranial nerve deficit.     Motor: No abnormal muscle tone.     Coordination: Coordination normal.     Deep Tendon Reflexes: Reflexes normal.  Psychiatric:        Behavior: Behavior normal.        Thought Content: Thought content normal.        Judgment: Judgment normal.     L big toe pain>R toe - ingrown w/o infection  Alopecia A/o/c   Lab Results  Component Value Date   WBC 4.5 03/17/2018   HGB 9.9 (L) 03/17/2018   HCT 30.0 (L) 03/17/2018   PLT 229 03/17/2018   GLUCOSE 95 11/07/2017   CHOL 154 06/07/2015   TRIG 96.0 06/07/2015   HDL 47.60 06/07/2015   LDLCALC 87 06/07/2015   ALT 14 11/07/2017   AST 25 11/07/2017   NA 137 11/07/2017   K 3.6 11/07/2017   CL 98 11/07/2017   CREATININE 1.93 (H) 11/07/2017   BUN 33 (H) 11/07/2017   CO2 29 11/07/2017   TSH 2.340 09/09/2016   INR 0.98 05/03/2014    Dg Ribs Unilateral W/chest Left  Result  Date: 11/07/2017 CLINICAL DATA:  Fall yesterday, mid anterior left rib pain. Initial encounter. EXAM: LEFT RIBS AND CHEST - 3+ VIEW COMPARISON:  12/24/2016. FINDINGS: Trachea is midline. Heart size stable. Thoracic aorta is calcified. Probable linear scarring  in the peripheral right midlung zone and both costophrenic angles. No airspace consolidation or pleural fluid. Biapical pleural thickening. No pleural fluid. There may be at least 1 lateral left rib fracture involving the left sixth rib. IMPRESSION: 1. Suspect at least 1 fracture involving the lateral left sixth rib. 2. No acute findings in the lungs. 3.  Aortic atherosclerosis (ICD10-170.0). Electronically Signed   By: Lorin Picket M.D.   On: 11/07/2017 15:03   Dg Shoulder Left  Result Date: 11/07/2017 CLINICAL DATA:  Fall yesterday, generalized left shoulder pain, initial encounter. EXAM: LEFT SHOULDER - 2+ VIEW COMPARISON:  None. FINDINGS: No fracture or dislocation. Degenerative changes in the left acromioclavicular and glenohumeral joints. Visualized portion of the left chest is unremarkable. IMPRESSION: 1. No acute findings. 2. Left acromioclavicular and glenohumeral joint osteoarthritis. Electronically Signed   By: Lorin Picket M.D.   On: 11/07/2017 15:04    Assessment & Plan:   There are no diagnoses linked to this encounter.   No orders of the defined types were placed in this encounter.    Follow-up: No follow-ups on file.  [name], MD

## 2018-04-02 NOTE — Patient Instructions (Addendum)
Continue doing brain stimulating activities (puzzles, reading, adult coloring books, staying active) to keep memory sharp.   Continue to eat heart healthy diet (full of fruits, vegetables, whole grains, lean protein, water--limit salt, fat, and sugar intake) and increase physical activity as tolerated.   [name] [name] , Thank you for taking time to come for your Medicare Wellness Visit. I appreciate your ongoing commitment to your health goals. Please review the following plan we discussed and let me know if I can assist you in the future.   These are the goals we discussed: Goals    . patient     Will review information on low purine foods;  Will also watch sodium in diet      . Patient Stated     Stay as healthy and as independent as possible    . Patient Stated     Maintain current health status and stay as healthy and as independent as possible.       This is a list of the screening recommended for you and due dates:  Health Maintenance  Topic Date Due  . Tetanus Vaccine  04/09/1950  . Flu Shot  Completed  . DEXA scan (bone density measurement)  Completed  . Pneumonia vaccines  Completed   Health Maintenance, Female Adopting a healthy lifestyle and getting preventive care can go a long way to promote health and wellness. Talk with your health care provider about what schedule of regular examinations is right for you. This is a good chance for you to check in with your provider about disease prevention and staying healthy. In between checkups, there are plenty of things you can do on your own. Experts have done a lot of research about which lifestyle changes and preventive measures are most likely to keep you healthy. Ask your health care provider for more information. Weight and diet Eat a healthy diet  Be sure to include plenty of vegetables, fruits, low-fat dairy products, and lean protein.  Do not eat a lot of foods high in solid fats, added sugars, or salt.  Get regular  exercise. This is one of the most important things you can do for your health. ? Most adults should exercise for at least 150 minutes each week. The exercise should increase your heart rate and make you sweat (moderate-intensity exercise). ? Most adults should also do strengthening exercises at least twice a week. This is in addition to the moderate-intensity exercise. Maintain a healthy weight  Body mass index (BMI) is a measurement that can be used to identify possible weight problems. It estimates body fat based on height and weight. Your health care provider can help determine your BMI and help you achieve or maintain a healthy weight.  For females 43 years of age and older: ? A BMI below 18.5 is considered underweight. ? A BMI of 18.5 to 24.9 is normal. ? A BMI of 25 to 29.9 is considered overweight. ? A BMI of 30 and above is considered obese. Watch levels of cholesterol and blood lipids  You should start having your blood tested for lipids and cholesterol at 83 years of age, then have this test every 5 years.  You may need to have your cholesterol levels checked more often if: ? Your lipid or cholesterol levels are high. ? You are older than 83 years of age. ? You are at high risk for heart disease. Cancer screening Lung Cancer  Lung cancer screening is recommended for adults 66-10 years old  who are at high risk for lung cancer because of a history of smoking.  A yearly low-dose CT scan of the lungs is recommended for people who: ? Currently smoke. ? Have quit within the past 15 years. ? Have at least a 30-pack-year history of smoking. A pack year is smoking an average of one pack of cigarettes a day for 1 year.  Yearly screening should continue until it has been 15 years since you quit.  Yearly screening should stop if you develop a health problem that would prevent you from having lung cancer treatment. Breast Cancer  Practice breast self-awareness. This means  understanding how your breasts normally appear and feel.  It also means doing regular breast self-exams. Let your health care provider know about any changes, no matter how small.  If you are in your 20s or 30s, you should have a clinical breast exam (CBE) by a health care provider every 1-3 years as part of a regular health exam.  If you are 22 or older, have a CBE every year. Also consider having a breast X-ray (mammogram) every year.  If you have a family history of breast cancer, talk to your health care provider about genetic screening.  If you are at high risk for breast cancer, talk to your health care provider about having an MRI and a mammogram every year.  Breast cancer gene (BRCA) assessment is recommended for women who have family members with BRCA-related cancers. BRCA-related cancers include: ? Breast. ? Ovarian. ? Tubal. ? Peritoneal cancers.  Results of the assessment will determine the need for genetic counseling and BRCA1 and BRCA2 testing. Cervical Cancer Your health care provider may recommend that you be screened regularly for cancer of the pelvic organs (ovaries, uterus, and vagina). This screening involves a pelvic examination, including checking for microscopic changes to the surface of your cervix (Pap test). You may be encouraged to have this screening done every 3 years, beginning at age 39.  For women ages 42-65, health care providers may recommend pelvic exams and Pap testing every 3 years, or they may recommend the Pap and pelvic exam, combined with testing for human papilloma virus (HPV), every 5 years. Some types of HPV increase your risk of cervical cancer. Testing for HPV may also be done on women of any age with unclear Pap test results.  Other health care providers may not recommend any screening for nonpregnant women who are considered low risk for pelvic cancer and who do not have symptoms. Ask your health care provider if a screening pelvic exam is right  for you.  If you have had past treatment for cervical cancer or a condition that could lead to cancer, you need Pap tests and screening for cancer for at least 20 years after your treatment. If Pap tests have been discontinued, your risk factors (such as having a new sexual partner) need to be reassessed to determine if screening should resume. Some women have medical problems that increase the chance of getting cervical cancer. In these cases, your health care provider may recommend more frequent screening and Pap tests. Colorectal Cancer  This type of cancer can be detected and often prevented.  Routine colorectal cancer screening usually begins at 83 years of age and continues through 83 years of age.  Your health care provider may recommend screening at an earlier age if you have risk factors for colon cancer.  Your health care provider may also recommend using home test kits to check for  hidden blood in the stool.  A small camera at the end of a tube can be used to examine your colon directly (sigmoidoscopy or colonoscopy). This is done to check for the earliest forms of colorectal cancer.  Routine screening usually begins at age 58.  Direct examination of the colon should be repeated every 5-10 years through 83 years of age. However, you may need to be screened more often if early forms of precancerous polyps or small growths are found. Skin Cancer  Check your skin from head to toe regularly.  Tell your health care provider about any new moles or changes in moles, especially if there is a change in a mole's shape or color.  Also tell your health care provider if you have a mole that is larger than the size of a pencil eraser.  Always use sunscreen. Apply sunscreen liberally and repeatedly throughout the day.  Protect yourself by wearing long sleeves, pants, a wide-brimmed hat, and sunglasses whenever you are outside. Heart disease, diabetes, and high blood pressure  High blood  pressure causes heart disease and increases the risk of stroke. High blood pressure is more likely to develop in: ? People who have blood pressure in the high end of the normal range (130-139/85-89 mm Hg). ? People who are overweight or obese. ? People who are African American.  If you are 45-12 years of age, have your blood pressure checked every 3-5 years. If you are 16 years of age or older, have your blood pressure checked every year. You should have your blood pressure measured twice-once when you are at a hospital or clinic, and once when you are not at a hospital or clinic. Record the average of the two measurements. To check your blood pressure when you are not at a hospital or clinic, you can use: ? An automated blood pressure machine at a pharmacy. ? A home blood pressure monitor.  If you are between 81 years and 82 years old, ask your health care provider if you should take aspirin to prevent strokes.  Have regular diabetes screenings. This involves taking a blood sample to check your fasting blood sugar level. ? If you are at a normal weight and have a low risk for diabetes, have this test once every three years after 83 years of age. ? If you are overweight and have a high risk for diabetes, consider being tested at a younger age or more often. Preventing infection Hepatitis B  If you have a higher risk for hepatitis B, you should be screened for this virus. You are considered at high risk for hepatitis B if: ? You were born in a country where hepatitis B is common. Ask your health care provider which countries are considered high risk. ? Your parents were born in a high-risk country, and you have not been immunized against hepatitis B (hepatitis B vaccine). ? You have HIV or AIDS. ? You use needles to inject street drugs. ? You live with someone who has hepatitis B. ? You have had sex with someone who has hepatitis B. ? You get hemodialysis treatment. ? You take certain  medicines for conditions, including cancer, organ transplantation, and autoimmune conditions. Hepatitis C  Blood testing is recommended for: ? Everyone born from [birth year] through 1963. ? Anyone with known risk factors for hepatitis C. Sexually transmitted infections (STIs)  You should be screened for sexually transmitted infections (STIs) including gonorrhea and chlamydia if: ? You are sexually active and are younger  than 83 years of age. ? You are older than 83 years of age and your health care provider tells you that you are at risk for this type of infection. ? Your sexual activity has changed since you were last screened and you are at an increased risk for chlamydia or gonorrhea. Ask your health care provider if you are at risk.  If you do not have HIV, but are at risk, it may be recommended that you take a prescription medicine daily to prevent HIV infection. This is called pre-exposure prophylaxis (PrEP). You are considered at risk if: ? You are sexually active and do not regularly use condoms or know the HIV status of your partner(s). ? You take drugs by injection. ? You are sexually active with a partner who has HIV. Talk with your health care provider about whether you are at high risk of being infected with HIV. If you choose to begin PrEP, you should first be tested for HIV. You should then be tested every 3 months for as long as you are taking PrEP. Pregnancy  If you are premenopausal and you may become pregnant, ask your health care provider about preconception counseling.  If you may become pregnant, take 400 to 800 micrograms (mcg) of folic acid every day.  If you want to prevent pregnancy, talk to your health care provider about birth control (contraception). Osteoporosis and menopause  Osteoporosis is a disease in which the bones lose minerals and strength with aging. This can result in serious bone fractures. Your risk for osteoporosis can be identified using a bone density  scan.  If you are 54 years of age or older, or if you are at risk for osteoporosis and fractures, ask your health care provider if you should be screened.  Ask your health care provider whether you should take a calcium or vitamin D supplement to lower your risk for osteoporosis.  Menopause may have certain physical symptoms and risks.  Hormone replacement therapy may reduce some of these symptoms and risks. Talk to your health care provider about whether hormone replacement therapy is right for you. Follow these instructions at home:  Schedule regular health, dental, and eye exams.  Stay current with your immunizations.  Do not use any tobacco products including cigarettes, chewing tobacco, or electronic cigarettes.  If you are pregnant, do not drink alcohol.  If you are breastfeeding, limit how much and how often you drink alcohol.  Limit alcohol intake to no more than 1 drink per day for nonpregnant women. One drink equals 12 ounces of beer, 5 ounces of Trino Higinbotham, or 1 ounces of hard liquor.  Do not use street drugs.  Do not share needles.  Ask your health care provider for help if you need support or information about quitting drugs.  Tell your health care provider if you often feel depressed.  Tell your health care provider if you have ever been abused or do not feel safe at home. This information is not intended to replace advice given to you by your health care provider. Make sure you discuss any questions you have with your health care provider. Document Released: 07/23/2010 Document Revised: 06/15/2015 Document Reviewed: 10/11/2014 Elsevier Interactive Patient Education  2019 [employer].

## 2018-04-02 NOTE — Assessment & Plan Note (Signed)
L big toe pain>R toe - no infection Podiatry ref

## 2018-04-02 NOTE — Assessment & Plan Note (Signed)
No sx's now ?

## 2018-04-02 NOTE — Assessment & Plan Note (Signed)
F/u at [facility] S/p XRT; has not have chemo yet

## 2018-04-02 NOTE — Patient Instructions (Signed)
[facility] Phone: [phone] Fax: [phone] [address] Mon-Fri 8:00 a.m. - 5:00 p.m. (Monday - Thursday 8:00 a.m. -...)

## 2018-04-06 ENCOUNTER — Other Ambulatory Visit: Payer: Self-pay | Admitting: Internal Medicine

## 2018-04-08 DIAGNOSIS — C490 Malignant neoplasm of connective and soft tissue of head, face and neck: Secondary | ICD-10-CM | POA: Diagnosis not present

## 2018-04-08 DIAGNOSIS — R918 Other nonspecific abnormal finding of lung field: Secondary | ICD-10-CM | POA: Diagnosis not present

## 2018-04-08 DIAGNOSIS — C760 Malignant neoplasm of head, face and neck: Secondary | ICD-10-CM | POA: Diagnosis not present

## 2018-04-14 ENCOUNTER — Encounter: Payer: Self-pay | Admitting: Cardiovascular Disease

## 2018-04-14 NOTE — Telephone Encounter (Signed)
error 

## 2018-04-19 ENCOUNTER — Other Ambulatory Visit: Payer: Self-pay | Admitting: Internal Medicine

## 2018-04-20 ENCOUNTER — Encounter: Payer: Self-pay | Admitting: Podiatry

## 2018-04-20 ENCOUNTER — Ambulatory Visit (INDEPENDENT_AMBULATORY_CARE_PROVIDER_SITE_OTHER): Payer: Medicare Other | Admitting: Podiatry

## 2018-04-20 ENCOUNTER — Other Ambulatory Visit: Payer: Self-pay

## 2018-04-20 VITALS — BP 138/69 | HR 67 | Temp 97.0°F | Resp 16 | Ht 62.0 in | Wt 149.0 lb

## 2018-04-20 DIAGNOSIS — B351 Tinea unguium: Secondary | ICD-10-CM

## 2018-04-20 DIAGNOSIS — M79676 Pain in unspecified toe(s): Secondary | ICD-10-CM

## 2018-04-20 NOTE — Progress Notes (Signed)
   Subjective:    Patient ID: [name] [name], female    DOB: January 20, 1932, 85 y.o.   MRN: [id number]  HPI    Review of Systems  All other systems reviewed and are negative.      Objective:   Physical Exam        Assessment & Plan:

## 2018-04-20 NOTE — Progress Notes (Signed)
   SUBJECTIVE Patient presents to office today as a new patient complaining of elongated, thickened nails that cause pain while ambulating in shoes.  She is unable to trim her own nails.  Patient is experiencing pain to the left great toe lateral border is been ongoing for about 6 weeks now.  This likely began with her last pedicure.  Patient is here for further evaluation and treatment.  Past Medical History:  Diagnosis Date  . Anemia, unspecified   . 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 Anxiety state, unspecified   . Cancer ([place])   . Depressive disorder, not elsewhere classified   . Diverticulosis of colon (without mention of hemorrhage)   . Diverticulosis of colon with hemorrhage   . Esophageal reflux   . History of echocardiogram    Echo 8/18: EF 55-60, normal wall motion, grade 2 diastolic dysfunction, mild AI, mild MR, PASP 32  . Lumbago   . Myocardial infarction ([place])   . Other and unspecified hyperlipidemia   . Persistent disorder of initiating or maintaining sleep   . Thyroid disease   . Unspecified asthma(493.90)   . Unspecified disorder resulting from impaired renal function   . Unspecified essential hypertension    Dr [name]  . Unspecified osteomyelitis, site unspecified   . Unspecified vitamin D deficiency     OBJECTIVE General Patient is awake, alert, and oriented x 3 and in no acute distress. Derm Skin is dry and supple bilateral. Negative open lesions or macerations. Remaining integument unremarkable. Nails are tender, long, thickened and dystrophic with subungual debris, consistent with onychomycosis, 1-5 bilateral. No signs of infection noted. Vasc  DP and PT pedal pulses palpable bilaterally. Temperature gradient within normal limits.  Neuro Epicritic and protective threshold sensation grossly intact bilaterally.  Musculoskeletal Exam No symptomatic pedal deformities noted bilateral. Muscular strength within normal limits.  ASSESSMENT 1. Onychodystrophic  nails 1-5 bilateral with hyperkeratosis of nails.  2. Onychomycosis of nail due to dermatophyte bilateral 3. Pain in foot bilateral  PLAN OF CARE 1. Patient evaluated today.  2. Instructed to maintain good pedal hygiene and foot care.  3. Mechanical debridement of nails 1-5 bilaterally performed using a nail nipper. Filed with dremel without incident.  4. Return to clinic in 3 mos.    Edrick Kins, DPM [facility]  Dr. Edrick Kins, [address]                                        [place], [place] [address]                Office [phone]  Fax [phone]

## 2018-04-24 ENCOUNTER — Other Ambulatory Visit: Payer: Self-pay | Admitting: Internal Medicine

## 2018-04-24 DIAGNOSIS — Z1231 Encounter for screening mammogram for malignant neoplasm of breast: Secondary | ICD-10-CM

## 2018-04-28 ENCOUNTER — Other Ambulatory Visit: Payer: Self-pay | Admitting: Internal Medicine

## 2018-05-15 ENCOUNTER — Inpatient Hospital Stay: Payer: Medicare Other | Attending: Hematology

## 2018-05-15 ENCOUNTER — Other Ambulatory Visit: Payer: Self-pay

## 2018-05-15 ENCOUNTER — Inpatient Hospital Stay: Payer: Medicare Other

## 2018-05-15 VITALS — BP 129/62 | HR 68 | Temp 98.0°F | Resp 18

## 2018-05-15 DIAGNOSIS — Z79899 Other long term (current) drug therapy: Secondary | ICD-10-CM | POA: Diagnosis not present

## 2018-05-15 DIAGNOSIS — N184 Chronic kidney disease, stage 4 (severe): Secondary | ICD-10-CM | POA: Diagnosis not present

## 2018-05-15 DIAGNOSIS — I129 Hypertensive chronic kidney disease with stage 1 through stage 4 chronic kidney disease, or unspecified chronic kidney disease: Secondary | ICD-10-CM | POA: Diagnosis not present

## 2018-05-15 DIAGNOSIS — D631 Anemia in chronic kidney disease: Secondary | ICD-10-CM | POA: Diagnosis not present

## 2018-05-15 DIAGNOSIS — N189 Chronic kidney disease, unspecified: Principal | ICD-10-CM

## 2018-05-15 LAB — CBC WITH DIFFERENTIAL/PLATELET
Abs Immature Granulocytes: 0.01 10*3/uL (ref 0.00–0.07)
Basophils Absolute: 0 10*3/uL (ref 0.0–0.1)
Basophils Relative: 1 %
Eosinophils Absolute: 0.2 10*3/uL (ref 0.0–0.5)
Eosinophils Relative: 3 %
HCT: 31.6 % — ABNORMAL LOW (ref 36.0–46.0)
Hemoglobin: 10.3 g/dL — ABNORMAL LOW (ref 12.0–15.0)
Immature Granulocytes: 0 %
Lymphocytes Relative: 15 %
Lymphs Abs: 0.9 10*3/uL (ref 0.7–4.0)
MCH: 30.8 pg (ref 26.0–34.0)
MCHC: 32.6 g/dL (ref 30.0–36.0)
MCV: 94.6 fL (ref 80.0–100.0)
Monocytes Absolute: 0.5 10*3/uL (ref 0.1–1.0)
Monocytes Relative: 9 %
Neutro Abs: 4.2 10*3/uL (ref 1.7–7.7)
Neutrophils Relative %: 72 %
Platelets: 223 10*3/uL (ref 150–400)
RBC: 3.34 MIL/uL — ABNORMAL LOW (ref 3.87–5.11)
RDW: 13.2 % (ref 11.5–15.5)
WBC: 5.9 10*3/uL (ref 4.0–10.5)
nRBC: 0 % (ref 0.0–0.2)

## 2018-05-15 MED ORDER — DARBEPOETIN ALFA 200 MCG/0.4ML IJ SOSY
200.0000 ug | PREFILLED_SYRINGE | Freq: Once | INTRAMUSCULAR | Status: AC
  Administered 2018-05-15: 200 ug via SUBCUTANEOUS

## 2018-05-15 MED ORDER — DARBEPOETIN ALFA 200 MCG/0.4ML IJ SOSY
PREFILLED_SYRINGE | INTRAMUSCULAR | Status: AC
  Filled 2018-05-15: qty 0.4

## 2018-06-19 ENCOUNTER — Ambulatory Visit: Payer: Self-pay

## 2018-06-22 ENCOUNTER — Other Ambulatory Visit: Payer: Self-pay | Admitting: Internal Medicine

## 2018-07-08 DIAGNOSIS — C7989 Secondary malignant neoplasm of other specified sites: Secondary | ICD-10-CM | POA: Diagnosis not present

## 2018-07-08 DIAGNOSIS — C7802 Secondary malignant neoplasm of left lung: Secondary | ICD-10-CM | POA: Diagnosis not present

## 2018-07-08 DIAGNOSIS — C7801 Secondary malignant neoplasm of right lung: Secondary | ICD-10-CM | POA: Diagnosis not present

## 2018-07-08 DIAGNOSIS — C760 Malignant neoplasm of head, face and neck: Secondary | ICD-10-CM | POA: Diagnosis not present

## 2018-07-08 DIAGNOSIS — C499 Malignant neoplasm of connective and soft tissue, unspecified: Secondary | ICD-10-CM | POA: Diagnosis not present

## 2018-07-08 DIAGNOSIS — C490 Malignant neoplasm of connective and soft tissue of head, face and neck: Secondary | ICD-10-CM | POA: Diagnosis not present

## 2018-07-08 DIAGNOSIS — R918 Other nonspecific abnormal finding of lung field: Secondary | ICD-10-CM | POA: Diagnosis not present

## 2018-07-10 ENCOUNTER — Telehealth: Payer: Self-pay

## 2018-07-10 NOTE — Telephone Encounter (Signed)
Noted. I'm sorry! Thx

## 2018-07-10 NOTE — Telephone Encounter (Signed)
Copied from [place] [id number]. Topic: Appointment Scheduling - Scheduling Inquiry for Clinic >> Jul 10, 2018 12:47 PM Scherrie Gerlach wrote: Reason for CRM: pt states she is supposed to see Jonelle Sidle on Monday at 2 pm  for her prolia. I do not see pt on the schedule.  She will be in a black honda.  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  Patient is scheduled for nurse visit on 6/22 at 2pm---routing note to dr plotnikov to advise about patient's [facility] cancer visit information----routing to dr plotnikov, fyi.Marland KitchenMarland KitchenMarland Kitchen

## 2018-07-13 ENCOUNTER — Ambulatory Visit (INDEPENDENT_AMBULATORY_CARE_PROVIDER_SITE_OTHER): Payer: Medicare Other

## 2018-07-13 DIAGNOSIS — M810 Age-related osteoporosis without current pathological fracture: Secondary | ICD-10-CM | POA: Diagnosis not present

## 2018-07-13 MED ORDER — DENOSUMAB 60 MG/ML ~~LOC~~ SOSY
60.0000 mg | PREFILLED_SYRINGE | Freq: Once | SUBCUTANEOUS | Status: AC
  Administered 2018-07-13: 60 mg via SUBCUTANEOUS

## 2018-07-15 ENCOUNTER — Inpatient Hospital Stay: Payer: Medicare Other

## 2018-07-15 ENCOUNTER — Inpatient Hospital Stay: Payer: Medicare Other | Attending: Hematology

## 2018-07-15 ENCOUNTER — Other Ambulatory Visit: Payer: Self-pay

## 2018-07-15 ENCOUNTER — Ambulatory Visit: Payer: Self-pay

## 2018-07-15 VITALS — BP 150/60 | HR 80 | Temp 99.0°F | Resp 18

## 2018-07-15 DIAGNOSIS — N189 Chronic kidney disease, unspecified: Secondary | ICD-10-CM | POA: Insufficient documentation

## 2018-07-15 DIAGNOSIS — D631 Anemia in chronic kidney disease: Secondary | ICD-10-CM | POA: Insufficient documentation

## 2018-07-15 DIAGNOSIS — I129 Hypertensive chronic kidney disease with stage 1 through stage 4 chronic kidney disease, or unspecified chronic kidney disease: Secondary | ICD-10-CM | POA: Insufficient documentation

## 2018-07-15 DIAGNOSIS — Z79899 Other long term (current) drug therapy: Secondary | ICD-10-CM | POA: Insufficient documentation

## 2018-07-15 LAB — CBC WITH DIFFERENTIAL/PLATELET
Abs Immature Granulocytes: 0.02 10*3/uL (ref 0.00–0.07)
Basophils Absolute: 0 10*3/uL (ref 0.0–0.1)
Basophils Relative: 1 %
Eosinophils Absolute: 0.2 10*3/uL (ref 0.0–0.5)
Eosinophils Relative: 3 %
HCT: 30.1 % — ABNORMAL LOW (ref 36.0–46.0)
Hemoglobin: 9.8 g/dL — ABNORMAL LOW (ref 12.0–15.0)
Immature Granulocytes: 0 %
Lymphocytes Relative: 17 %
Lymphs Abs: 0.8 10*3/uL (ref 0.7–4.0)
MCH: 31.2 pg (ref 26.0–34.0)
MCHC: 32.6 g/dL (ref 30.0–36.0)
MCV: 95.9 fL (ref 80.0–100.0)
Monocytes Absolute: 0.5 10*3/uL (ref 0.1–1.0)
Monocytes Relative: 10 %
Neutro Abs: 3.4 10*3/uL (ref 1.7–7.7)
Neutrophils Relative %: 69 %
Platelets: 234 10*3/uL (ref 150–400)
RBC: 3.14 MIL/uL — ABNORMAL LOW (ref 3.87–5.11)
RDW: 13.6 % (ref 11.5–15.5)
WBC: 5 10*3/uL (ref 4.0–10.5)
nRBC: 0 % (ref 0.0–0.2)

## 2018-07-15 MED ORDER — DARBEPOETIN ALFA 200 MCG/0.4ML IJ SOSY
200.0000 ug | PREFILLED_SYRINGE | Freq: Once | INTRAMUSCULAR | Status: AC
  Administered 2018-07-15: 200 ug via SUBCUTANEOUS

## 2018-07-15 MED ORDER — DARBEPOETIN ALFA 200 MCG/0.4ML IJ SOSY
PREFILLED_SYRINGE | INTRAMUSCULAR | Status: AC
  Filled 2018-07-15: qty 0.4

## 2018-07-15 NOTE — Patient Instructions (Signed)

## 2018-07-19 NOTE — Progress Notes (Signed)
Medical screening examination/treatment/procedure(s) were performed by non-physician practitioner and as supervising physician I was immediately available for consultation/collaboration. I agree with above. Makenzee Choudhry, MD  

## 2018-07-21 ENCOUNTER — Ambulatory Visit (INDEPENDENT_AMBULATORY_CARE_PROVIDER_SITE_OTHER): Payer: Medicare Other | Admitting: Podiatry

## 2018-07-21 ENCOUNTER — Other Ambulatory Visit: Payer: Self-pay

## 2018-07-21 ENCOUNTER — Encounter: Payer: Self-pay | Admitting: Podiatry

## 2018-07-21 VITALS — Temp 98.5°F

## 2018-07-21 DIAGNOSIS — B351 Tinea unguium: Secondary | ICD-10-CM | POA: Diagnosis not present

## 2018-07-21 DIAGNOSIS — M79676 Pain in unspecified toe(s): Secondary | ICD-10-CM

## 2018-07-21 NOTE — Patient Instructions (Signed)

## 2018-07-25 NOTE — Progress Notes (Signed)
Subjective:  [name] [name] presents to clinic today with cc of  painful, thick, discolored, elongated toenails 1-5 b/l that become tender and cannot cut because of thickness. Pain is aggravated when wearing enclosed shoe gear.  Plotnikov, [name], MD is her PCP.   She voices no new pedal problems on today's visit.   Current Outpatient Medications:  .  acetaminophen (TYLENOL) 325 MG tablet, Take 2 tablets (650 mg total) by mouth every 6 (six) hours as needed for mild pain (or Fever >/= 101)., Disp: 30 tablet, Rfl: 0 .  albuterol (PROVENTIL HFA;VENTOLIN HFA) 108 (90 Base) MCG/ACT inhaler, Inhale 1-2 puffs into the lungs every 6 (six) hours as needed for wheezing or shortness of breath., Disp: 1 Inhaler, Rfl: 1 .  allopurinol (ZYLOPRIM) 100 MG tablet, TAKE 1 TABLET BY MOUTH EVERY DAY, Disp: 90 tablet, Rfl: 1 .  aspirin EC 81 MG tablet, Take 1 tablet (81 mg total) by mouth daily., Disp: 90 tablet, Rfl: 3 .  atorvastatin (LIPITOR) 40 MG tablet, TAKE 1 TABLET (40 MG TOTAL) BY MOUTH DAILY AT 6 PM. -- OFFICE VISIT NEEDED FOR FURTHER REFILLS, Disp: 90 tablet, Rfl: 3 .  b complex vitamins capsule, Take 1 capsule by mouth daily., Disp: , Rfl:  .  Biotin 5000 MCG CAPS, Take by mouth 1 day or 1 dose., Disp: , Rfl:  .  Cholecalciferol (EQL VITAMIN D3) 1000 UNITS tablet, Take 1 tablet (1,000 Units total) by mouth daily., Disp: 100 tablet, Rfl: 3 .  darbepoetin (ARANESP) 100 MCG/0.5ML SOLN injection, Inject 100 mcg into the skin every 8 (eight) weeks., Disp: , Rfl:  .  escitalopram (LEXAPRO) 5 MG tablet, TAKE 1 TABLET BY MOUTH EVERY DAY, Disp: 30 tablet, Rfl: 5 .  furosemide (LASIX) 20 MG tablet, TAKE 1 TABLET BY MOUTH EVERY DAY, Disp: 90 tablet, Rfl: 3 .  levothyroxine (SYNTHROID) 50 MCG tablet, TAKE 1 TABLET (50 MCG TOTAL) BY MOUTH DAILY BEFORE BREAKFAST. MUST KEEP APPT ON 04/02/18, Disp: 90 tablet, Rfl: 3 .  metoprolol tartrate (LOPRESSOR) 25 MG tablet, TAKE 1/2 TABLET BY MOUTH TWICE A DAY, Disp: 90  tablet, Rfl: 3 .  pantoprazole (PROTONIX) 40 MG tablet, Take 1 tablet (40 mg total) by mouth daily as needed., Disp: 90 tablet, Rfl: 2 .  polyethylene glycol powder (GLYCOLAX/MIRALAX) powder, Take 17 g by mouth 2 (two) times daily as needed for mild constipation or moderate constipation., Disp: 500 g, Rfl: 3 .  triamcinolone cream (KENALOG) 0.1 %, Apply 1 application topically 2 (two) times daily., Disp: 45 g, Rfl: 1 .  budesonide-formoterol (SYMBICORT) 160-4.5 MCG/ACT inhaler, Inhale 2 puffs into the lungs 2 (two) times daily., Disp: 1 Inhaler, Rfl: 3 No current facility-administered medications for this visit.   Facility-Administered Medications Ordered in Other Visits:  .  Darbepoetin Alfa (ARANESP) injection 200 mcg, 200 mcg, Subcutaneous, Once, [name], MD .  Darbepoetin Alfa (ARANESP) injection 500 mcg, 500 mcg, Subcutaneous, Once, [name], MD   Allergies  Allergen Reactions  . Codeine Sulfate Nausea And Vomiting  . Darvon [Propoxyphene] Nausea And Vomiting    hallucinations  . Propoxyphene Napsylate Nausea And Vomiting  . Sulfacetamide Sodium-Sulfur Nausea And Vomiting  . Sulfamethoxazole-Trimethoprim Nausea Only    Can't remember reaction  . Zolpidem     Other reaction(s): Mental Status Changes (intolerance)     Objective: Vitals:   07/21/18 1017  Temp: 98.5 F (36.9 C)    Physical Examination:  Vascular Examination: Capillary refill time immediate x 10 digits.  Palpable DP/PT pulses b/l.  Digital hair sparse b/l.  No edema noted b/l.  Skin temperature gradient WNL b/l.  Dermatological Examination: Skin with normal turgor, texture and tone b/l.  No open wounds b/l.  No interdigital macerations noted b/l.  Elongated, thick, discolored brittle toenails with subungual debris and pain on dorsal palpation of nailbeds 1-5 b/l.  Musculoskeletal Examination: Muscle strength 5/5 to all muscle groups b/l  No pain, crepitus or joint discomfort with  active/passive ROM.  Neurological Examination: Sensation intact 5/5 b/l with 10 gram monofilament.  Vibratory sensation intact b/l.  Assessment: Mycotic nail infection with pain 1-5 b/l  Plan: 1. Toenails 1-5 b/l were debrided in length and girth without iatrogenic laceration. 2.  Continue soft, supportive shoe gear daily. 3.  Report any pedal injuries to medical professional. 4.  Follow up 3 months. 5.  Patient/POA to call should there be a question/concern in there interim.

## 2018-08-31 ENCOUNTER — Telehealth: Payer: Self-pay | Admitting: Hematology

## 2018-08-31 NOTE — Telephone Encounter (Signed)
YF PAL 8/24 moved f/u to [facility]. Confirmed with patient.

## 2018-09-04 ENCOUNTER — Ambulatory Visit (INDEPENDENT_AMBULATORY_CARE_PROVIDER_SITE_OTHER): Payer: Medicare Other | Admitting: Internal Medicine

## 2018-09-04 ENCOUNTER — Encounter: Payer: Self-pay | Admitting: Internal Medicine

## 2018-09-04 ENCOUNTER — Other Ambulatory Visit: Payer: Self-pay

## 2018-09-04 ENCOUNTER — Other Ambulatory Visit: Payer: Self-pay | Admitting: Internal Medicine

## 2018-09-04 ENCOUNTER — Other Ambulatory Visit (INDEPENDENT_AMBULATORY_CARE_PROVIDER_SITE_OTHER): Payer: Medicare Other

## 2018-09-04 VITALS — BP 134/78 | HR 81 | Temp 98.1°F | Ht 62.0 in | Wt 147.0 lb

## 2018-09-04 DIAGNOSIS — G4700 Insomnia, unspecified: Secondary | ICD-10-CM | POA: Diagnosis not present

## 2018-09-04 DIAGNOSIS — D631 Anemia in chronic kidney disease: Secondary | ICD-10-CM

## 2018-09-04 DIAGNOSIS — N183 Chronic kidney disease, stage 3 unspecified: Secondary | ICD-10-CM

## 2018-09-04 DIAGNOSIS — M5442 Lumbago with sciatica, left side: Secondary | ICD-10-CM | POA: Diagnosis not present

## 2018-09-04 DIAGNOSIS — N184 Chronic kidney disease, stage 4 (severe): Secondary | ICD-10-CM | POA: Diagnosis not present

## 2018-09-04 HISTORY — DX: Chronic kidney disease, stage 3 unspecified: N18.30

## 2018-09-04 LAB — BASIC METABOLIC PANEL
BUN: 35 mg/dL — ABNORMAL HIGH (ref 6–23)
CO2: 28 mEq/L (ref 19–32)
Calcium: 9 mg/dL (ref 8.4–10.5)
Chloride: 104 mEq/L (ref 96–112)
Creatinine, Ser: 1.87 mg/dL — ABNORMAL HIGH (ref 0.40–1.20)
GFR: 25.57 mL/min — ABNORMAL LOW (ref >60.00)
Glucose, Bld: 105 mg/dL — ABNORMAL HIGH (ref 70–99)
Potassium: 4 mEq/L (ref 3.5–5.1)
Sodium: 138 mEq/L (ref 135–145)

## 2018-09-04 MED ORDER — TRAMADOL HCL 50 MG PO TABS: 50.0000 mg | ORAL_TABLET | Freq: Four times a day (QID) | ORAL | 0 refills | Status: AC | PRN

## 2018-09-04 MED ORDER — LORAZEPAM 0.5 MG PO TABS: 0.5000 mg | ORAL_TABLET | Freq: Every day | ORAL | 1 refills | Status: AC

## 2018-09-04 MED ORDER — GABAPENTIN 100 MG PO CAPS: 100.0000 mg | ORAL_CAPSULE | Freq: Three times a day (TID) | ORAL | 3 refills | Status: DC

## 2018-09-04 NOTE — Assessment & Plan Note (Signed)
For f/u lab today, o/w stable overall by history and exam, recent data reviewed with pt, and pt to continue medical treatment as before,  to f/u any worsening symptoms or concerns

## 2018-09-04 NOTE — Progress Notes (Signed)
Subjective:    Patient ID: [name] [name], female    DOB: Jun 01, 1931, 85 y.o.   MRN: [id number]  HPI   Here to f/u as PCP not available; followed now at [facility] with angiosarcoma of scalp s/p XRT (no surgury, no CMT), has f/u aug 19 there with Dr Reita May, also has recently known bilateral lung metastasis.  .Also followed locally hematology for anemia for several years.  Today c/o worsening of the left lumbar and left leg pain ongoing for 2 or more years is now worse in the past wk. Seems to start at the lower back with some radiation mostly to left lateral hip area, but also occasionally past the left knee as well   Pt denies bowel or bladder change, fever, or falls. Last MRI LS spine 2016 summar only -  IMPRESSION: 1. Multilevel chronic compression fractures. Mild to moderate marrow edema at the T12 level suggesting acute or subacute exacerbation of chronic compression. Trace marrow edema also at the L2-L3 endplates. 2. Mild retropulsion of bone at most levels exacerbating chronic disc and endplate degeneration. Up to mild degenerative spinal stenosis at L1-L2, L2-L3 and L3-L4. Mild lateral recess stenosis at L4-L5. Moderate to severe foraminal stenosis at L5-S1.  Also c/o lack of ability most nights to get to sleep, lorazepam helped before.   Gets cbc done every 2 months locally  Last renal fxn checked oct 2019 Past Medical History:  Diagnosis Date  . Anemia, unspecified   . Angiosarcoma ([place])    dx in April   . Anxiety state, unspecified   . Cancer ([place])   . CKD (chronic kidney disease) stage 3, GFR 30-59 ml/min (HCC) 09/04/2018  . Depressive disorder, not elsewhere classified   . Diverticulosis of colon (without mention of hemorrhage)   . Diverticulosis of colon with hemorrhage   . Esophageal reflux   . History of echocardiogram    Echo 8/18: EF 55-60, normal wall motion, grade 2 diastolic dysfunction, mild AI, mild MR, PASP 32  . Lumbago   . Myocardial infarction ([place])   . Other  and unspecified hyperlipidemia   . Persistent disorder of initiating or maintaining sleep   . Thyroid disease   . Unspecified asthma(493.90)   . Unspecified disorder resulting from impaired renal function   . Unspecified essential hypertension    Dr [name]  . Unspecified osteomyelitis, site unspecified   . Unspecified vitamin D deficiency    Past Surgical History:  Procedure Laterality Date  . BONE MARROW BIOPSY    . CORONARY ARTERY BYPASS GRAFT  03/25/13   x4. CABS ON PUMP-Surgeon Jomarie Longs.  [name] PARTIAL COLECTOMY  1995   diverticulosis of colon iwth hemorrhage.   . status post right carotid enterectomy      reports that she has never smoked. She has never used smokeless tobacco. She reports current alcohol use of about 1.0 - 2.0 standard drinks of alcohol per week. She reports that she does not use drugs. family history includes Breast cancer in her cousin and cousin; COPD in her mother; Cancer in her father; Hypertension in an other family member. Allergies  Allergen Reactions  . Codeine Sulfate Nausea And Vomiting  . Darvon [Propoxyphene] Nausea And Vomiting    hallucinations  . Propoxyphene Napsylate Nausea And Vomiting  . Sulfacetamide Sodium-Sulfur Nausea And Vomiting  . Sulfamethoxazole-Trimethoprim Nausea Only    Can't remember reaction  . Zolpidem     Other reaction(s): Mental Status Changes (intolerance)   . Current Outpatient Medications  on File Prior to Visit  Medication Sig Dispense Refill  . acetaminophen (TYLENOL) 325 MG tablet Take 2 tablets (650 mg total) by mouth every 6 (six) hours as needed for mild pain (or Fever >/= 101). 30 tablet 0  . albuterol (PROVENTIL HFA;VENTOLIN HFA) 108 (90 Base) MCG/ACT inhaler Inhale 1-2 puffs into the lungs every 6 (six) hours as needed for wheezing or shortness of breath. 1 Inhaler 1  . allopurinol (ZYLOPRIM) 100 MG tablet TAKE 1 TABLET BY MOUTH EVERY DAY 90 tablet 1  . aspirin EC 81 MG tablet Take 1 tablet (81 mg total) by mouth  daily. 90 tablet 3  . atorvastatin (LIPITOR) 40 MG tablet TAKE 1 TABLET (40 MG TOTAL) BY MOUTH DAILY AT 6 PM. -- OFFICE VISIT NEEDED FOR FURTHER REFILLS 90 tablet 3  . b complex vitamins capsule Take 1 capsule by mouth daily.    . Biotin 5000 MCG CAPS Take by mouth 1 day or 1 dose.    . Cholecalciferol (EQL VITAMIN D3) 1000 UNITS tablet Take 1 tablet (1,000 Units total) by mouth daily. 100 tablet 3  . darbepoetin (ARANESP) 100 MCG/0.5ML SOLN injection Inject 100 mcg into the skin every 8 (eight) weeks.    [name] escitalopram (LEXAPRO) 5 MG tablet TAKE 1 TABLET BY MOUTH EVERY DAY 30 tablet 5  . furosemide (LASIX) 20 MG tablet TAKE 1 TABLET BY MOUTH EVERY DAY 90 tablet 3  . levothyroxine (SYNTHROID) 50 MCG tablet TAKE 1 TABLET (50 MCG TOTAL) BY MOUTH DAILY BEFORE BREAKFAST. MUST KEEP APPT ON 04/02/18 90 tablet 3  . metoprolol tartrate (LOPRESSOR) 25 MG tablet TAKE 1/2 TABLET BY MOUTH TWICE A DAY 90 tablet 3  . pantoprazole (PROTONIX) 40 MG tablet Take 1 tablet (40 mg total) by mouth daily as needed. 90 tablet 2  . polyethylene glycol powder (GLYCOLAX/MIRALAX) powder Take 17 g by mouth 2 (two) times daily as needed for mild constipation or moderate constipation. 500 g 3  . triamcinolone cream (KENALOG) 0.1 % Apply 1 application topically 2 (two) times daily. 45 g 1  . budesonide-formoterol (SYMBICORT) 160-4.5 MCG/ACT inhaler Inhale 2 puffs into the lungs 2 (two) times daily. 1 Inhaler 3   Current Facility-Administered Medications on File Prior to Visit  Medication Dose Route Frequency Provider Last Rate Last Dose  . Darbepoetin Alfa (ARANESP) injection 200 mcg  200 mcg Subcutaneous Once [name], MD      . Darbepoetin Alfa Kyra Searles) injection 500 mcg  500 mcg Subcutaneous Once [name], MD       Review of Systems  Constitutional: Negative for other unusual diaphoresis or sweats HENT: Negative for ear discharge or swelling Eyes: Negative for other worsening visual disturbances Respiratory: Negative  for stridor or other swelling  Gastrointestinal: Negative for worsening distension or other blood Genitourinary: Negative for retention or other urinary change Musculoskeletal: Negative for other MSK pain or swelling Skin: Negative for color change or other new lesions Neurological: Negative for worsening tremors and other numbness  Psychiatric/Behavioral: Negative for worsening agitation or other fatigue All other system neg per pt    Objective:   Physical Exam BP 134/78   Pulse 81   Temp 98.1 F (36.7 C) (Oral)   Ht _0  (1.575 m)   Wt 147 lb (66.7 kg)   SpO2 96%   BMI 26.89 kg/m  VS noted, + alopecia Constitutional: Pt appears in NAD HENT: Head: NCAT.  Right Ear: External ear normal.  Left Ear: External ear normal.  Eyes: .  Pupils are equal, round, and reactive to light. Conjunctivae and EOM are normal Nose: without d/c or deformity Neck: Neck supple. Gross normal ROM Cardiovascular: Normal rate and regular rhythm.   Pulmonary/Chest: Effort normal and breath sounds without rales or wheezing.  Abd:  Soft, NT, ND, + BS, no organomegaly Neurological: Pt is alert. At baseline orientation, motor grossly intact Skin: Skin is warm. No rashes, other new lesions, no LE edema Psychiatric: Pt behavior is normal without agitation  No other exam findings Lab Results  Component Value Date   WBC 5.0 07/15/2018   HGB 9.8 (L) 07/15/2018   HCT 30.1 (L) 07/15/2018   PLT 234 07/15/2018   GLUCOSE 95 11/07/2017   CHOL 154 06/07/2015   TRIG 96.0 06/07/2015   HDL 47.60 06/07/2015   LDLCALC 87 06/07/2015   ALT 14 11/07/2017   AST 25 11/07/2017   NA 137 11/07/2017   K 3.6 11/07/2017   CL 98 11/07/2017   CREATININE 1.93 (H) 11/07/2017   BUN 33 (H) 11/07/2017   CO2 29 11/07/2017   TSH 2.340 09/09/2016   INR 0.98 05/03/2014       Assessment & Plan:

## 2018-09-04 NOTE — Patient Instructions (Signed)
Please take all new medication as prescribed - the tramadol for pain as needed, and the gabapentin for pain three times daily  Please take all new medication as prescribed- the lorazepam at bedtime  Please continue all other medications as before, and refills have been done if requested.  Please have the pharmacy call with any other refills you may need.  Please keep your appointments with your specialists as you may have planned - the Hematology every 2 months hemoglobin blood testing  Please go to the LAB in the Basement (turn left off the elevator) for the tests to be done today - the kidney tests today  You will be contacted by phone if any changes need to be made immediately.  Otherwise, you will receive a letter about your results with an explanation, but please check with MyChart first.  Please remember to sign up for MyChart if you have not done so, as this will be important to you in the future with finding out test results, communicating by private email, and scheduling acute appointments online when needed.  Please talk to your health care power of attorney regarding the OK for referral to a kidney specialist.

## 2018-09-04 NOTE — Assessment & Plan Note (Signed)
Ok for restart lorazepam given her co-morbids and low need to avoid dependency

## 2018-09-04 NOTE — Assessment & Plan Note (Signed)
Cont to f/u every 2 mo

## 2018-09-04 NOTE — Assessment & Plan Note (Signed)
Worsening symptoms, exam ok, but for tramadol prn, gabapentin 100 tid, and consider MRI for r/o metastasis related

## 2018-09-07 ENCOUNTER — Telehealth: Payer: Self-pay

## 2018-09-07 NOTE — Telephone Encounter (Signed)
Called pt, LVM.   CRM created.  

## 2018-09-07 NOTE — Telephone Encounter (Signed)
-----   Message from Biagio Borg, MD sent at 09/04/2018  3:30 PM EDT ----- Letter sent, cont same tx except  The test results show that your current treatment is OK, except the kidney is still quite low, now rated at Stage 4  (there is Stage 5 so it could be worse).  I know Dr Alain Marion is your regular doctor, but we should refer you to Nephrology (kidney doctor) to help you keep your kidney function as long as you can.  This can take some time, but hopefully you will hear soon.Redmond Baseman to please inform pt, I will do referral

## 2018-09-09 DIAGNOSIS — R918 Other nonspecific abnormal finding of lung field: Secondary | ICD-10-CM | POA: Diagnosis not present

## 2018-09-09 DIAGNOSIS — C7989 Secondary malignant neoplasm of other specified sites: Secondary | ICD-10-CM | POA: Diagnosis not present

## 2018-09-09 DIAGNOSIS — C499 Malignant neoplasm of connective and soft tissue, unspecified: Secondary | ICD-10-CM | POA: Diagnosis not present

## 2018-09-09 DIAGNOSIS — C7802 Secondary malignant neoplasm of left lung: Secondary | ICD-10-CM | POA: Diagnosis not present

## 2018-09-09 DIAGNOSIS — C490 Malignant neoplasm of connective and soft tissue of head, face and neck: Secondary | ICD-10-CM | POA: Diagnosis not present

## 2018-09-09 DIAGNOSIS — C7801 Secondary malignant neoplasm of right lung: Secondary | ICD-10-CM | POA: Diagnosis not present

## 2018-09-13 NOTE — Progress Notes (Signed)
[place]   [phone] [phone]   Clinic Follow up Note   Patient Care Team: Plotnikov, [name], MD as PCP - General (Internal Medicine) Josue Hector, MD as PCP - Cardiology (Cardiology) Josue Hector, MD as Consulting Physician (Cardiology) Clemon Chambers, MD (Radiology) [name], MD as Consulting Physician (Hematology)  Date of Service: 09/14/2018   CHIEF COMPLAINT: f/u anemia   CURRENT THERAPY: Aranesp 200 mcg subcutaneous every 2 months for hemoglobin less than 11.  INTERVAL HISTORY: [name] [name] returns for f/u as scheduled. She was seen at [facility] last week and informed that her angiosarcoma has progressed to lungs. Her BP that day was >200. Denies headache but thinks eyesight is getting worse. Has been checking BP at home and today it is finally improving. She is trying to drink better. She is fatigued, but functional and independent. Lives alone. Does not sleep well. She started gabapentin at night for left leg pain that has helped with sleep. Denies cough, chest pain, dyspnea. Ambulates with walker.    MEDICAL HISTORY:  Past Medical History:  Diagnosis Date  . Anemia, unspecified   . Angiosarcoma ([place])    dx in April   . Anxiety state, unspecified   . Cancer ([place])   . CKD (chronic kidney disease) stage 3, GFR 30-59 ml/min (HCC) 09/04/2018  . Depressive disorder, not elsewhere classified   . Diverticulosis of colon (without mention of hemorrhage)   . Diverticulosis of colon with hemorrhage   . Esophageal reflux   . History of echocardiogram    Echo 8/18: EF 55-60, normal wall motion, grade 2 diastolic dysfunction, mild AI, mild MR, PASP 32  . Lumbago   . Myocardial infarction ([place])   . Other and unspecified hyperlipidemia   . Persistent disorder of initiating or maintaining sleep   . Thyroid disease   . Unspecified asthma(493.90)   . Unspecified disorder resulting from impaired renal function   . Unspecified essential  hypertension    Dr [name]  . Unspecified osteomyelitis, site unspecified   . Unspecified vitamin D deficiency     SURGICAL HISTORY: Past Surgical History:  Procedure Laterality Date  . BONE MARROW BIOPSY    . CORONARY ARTERY BYPASS GRAFT  03/25/13   x4. CABS ON PUMP-Surgeon Jomarie Longs.  [name] PARTIAL COLECTOMY  1995   diverticulosis of colon iwth hemorrhage.   . status post right carotid enterectomy      I have reviewed the social history and family history with the patient and they are unchanged from previous note.  ALLERGIES:  is allergic to codeine sulfate; darvon [propoxyphene]; propoxyphene napsylate; sulfacetamide sodium-sulfur; sulfamethoxazole-trimethoprim; and zolpidem.  MEDICATIONS:  Current Outpatient Medications  Medication Sig Dispense Refill  . acetaminophen (TYLENOL) 325 MG tablet Take 2 tablets (650 mg total) by mouth every 6 (six) hours as needed for mild pain (or Fever >/= 101). 30 tablet 0  . albuterol (PROVENTIL HFA;VENTOLIN HFA) 108 (90 Base) MCG/ACT inhaler Inhale 1-2 puffs into the lungs every 6 (six) hours as needed for wheezing or shortness of breath. 1 Inhaler 1  . allopurinol (ZYLOPRIM) 100 MG tablet TAKE 1 TABLET BY MOUTH EVERY DAY 90 tablet 1  . aspirin EC 81 MG tablet Take 1 tablet (81 mg total) by mouth daily. 90 tablet 3  . atorvastatin (LIPITOR) 40 MG tablet TAKE 1 TABLET (40 MG TOTAL) BY MOUTH DAILY AT 6 PM. -- OFFICE VISIT NEEDED FOR FURTHER REFILLS 90 tablet 3  . b complex vitamins  capsule Take 1 capsule by mouth daily.    . Biotin 5000 MCG CAPS Take by mouth 1 day or 1 dose.    . budesonide-formoterol (SYMBICORT) 160-4.5 MCG/ACT inhaler Inhale 2 puffs into the lungs 2 (two) times daily. 1 Inhaler 3  . Cholecalciferol (EQL VITAMIN D3) 1000 UNITS tablet Take 1 tablet (1,000 Units total) by mouth daily. 100 tablet 3  . darbepoetin (ARANESP) 100 MCG/0.5ML SOLN injection Inject 100 mcg into the skin every 8 (eight) weeks.    [name] escitalopram (LEXAPRO) 5 MG  tablet TAKE 1 TABLET BY MOUTH EVERY DAY 30 tablet 5  . furosemide (LASIX) 20 MG tablet TAKE 1 TABLET BY MOUTH EVERY DAY 90 tablet 3  . gabapentin (NEURONTIN) 100 MG capsule Take 1 capsule (100 mg total) by mouth 3 (three) times daily. 90 capsule 3  . levothyroxine (SYNTHROID) 50 MCG tablet TAKE 1 TABLET (50 MCG TOTAL) BY MOUTH DAILY BEFORE BREAKFAST. MUST KEEP APPT ON 04/02/18 90 tablet 3  . LORazepam (ATIVAN) 0.5 MG tablet Take 1 tablet (0.5 mg total) by mouth at bedtime. 90 tablet 1  . metoprolol tartrate (LOPRESSOR) 25 MG tablet TAKE 1/2 TABLET BY MOUTH TWICE A DAY 90 tablet 3  . pantoprazole (PROTONIX) 40 MG tablet Take 1 tablet (40 mg total) by mouth daily as needed. 90 tablet 2  . polyethylene glycol powder (GLYCOLAX/MIRALAX) powder Take 17 g by mouth 2 (two) times daily as needed for mild constipation or moderate constipation. 500 g 3  . traMADol (ULTRAM) 50 MG tablet Take 1 tablet (50 mg total) by mouth every 6 (six) hours as needed. 30 tablet 0  . triamcinolone cream (KENALOG) 0.1 % Apply 1 application topically 2 (two) times daily. 45 g 1   No current facility-administered medications for this visit.    Facility-Administered Medications Ordered in Other Visits  Medication Dose Route Frequency Provider Last Rate Last Dose  . Darbepoetin Alfa (ARANESP) injection 200 mcg  200 mcg Subcutaneous Once [name], MD      . Darbepoetin Alfa Kyra Searles) injection 500 mcg  500 mcg Subcutaneous Once [name], MD        PHYSICAL EXAMINATION: ECOG PERFORMANCE STATUS: 2 - Symptomatic, <50% confined to bed  Vitals:   09/14/18 1357  BP: (!) 142/67  Pulse: 73  Resp: 17  Temp: 99.1 F (37.3 C)  SpO2: 100%   Filed Weights   09/14/18 1357  Weight: 147 lb 14.4 oz (67.1 kg)    GENERAL:alert, no distress and comfortable SKIN: no rash  EYES:  sclera clear LUNGS: clear to auscultation with normal breathing effort HEART: regular rate & rhythm, no lower extremity edema NEURO: alert & oriented x  3 with fluent speech, steady gait with walker Limited exam for covid19 outbreak   LABORATORY DATA:  I have reviewed the data as listed CBC Latest Ref Rng & Units 09/14/2018 07/15/2018 05/15/2018  WBC 4.0 - 10.5 K/uL 4.0 5.0 5.9  Hemoglobin 12.0 - 15.0 g/dL 9.8(L) 9.8(L) 10.3(L)  Hematocrit 36.0 - 46.0 % 29.5(L) 30.1(L) 31.6(L)  Platelets 150 - 400 K/uL 204 234 223     CMP Latest Ref Rng & Units 09/04/2018 11/07/2017 03/20/2017  Glucose 70 - 99 mg/dL 105(H) 95 84  BUN 6 - 23 mg/dL 35(H) 33(H) 40(H)  Creatinine 0.40 - 1.20 mg/dL 1.87(H) 1.93(H) 2.01(H)  Sodium 135 - 145 mEq/L 138 137 140  Potassium 3.5 - 5.1 mEq/L 4.0 3.6 3.9  Chloride 96 - 112 mEq/L 104 98 104  CO2  19 - 32 mEq/L '28 29 26  ' Calcium 8.4 - 10.5 mg/dL 9.0 9.6 9.2  Total Protein 6.0 - 8.3 g/dL - 7.3 6.6  Total Bilirubin 0.2 - 1.2 mg/dL - 0.6 0.6  Alkaline Phos 39 - 117 U/L - 77 100  AST 0 - 37 U/L - 25 26  ALT 0 - 35 U/L - 14 16      RADIOGRAPHIC STUDIES: I have personally reviewed the radiological images as listed and agreed with the findings in the report. No results found.   ASSESSMENT & PLAN: [name] [name] is a 85 y.o. female with history of  1. Anemia of chronic disease (CKD).   -initially on Aranesp q3 months with stable hemoglobin but she became more symptomatic, has been maintained on q2 month injection for couple years.   2. Chronic kidney disease.  -followed by Dr. Alain Marion -GFR 25 on 09/04/18, she saw Dr. Jenny Reichmann while PCP out of office who referred her to nephrologist, no appt yet   3. Gout  -on allopurinol  4. CAD, CHF, HTN, ARTHRITIS  -F/u with cardiologist   5. Hypertension - F/u PCP -she reports SBP>200 last week, today it is better -BP 142/67  6. Osteoporosis -DEXA on 12/25/2016 shows T score -2.6 at right femoral neck with high fracture risk -f/u PCP  7. Angiosarcoma of scalp  -s/p radiation by Drs. Riedel and Milana Na -f/u Dr. Angelina Ok on 09/09/18 who reviewed recent CT  which shows increase in size and number of bilateral sub-cm pulmonary nodules, consistent with metastatic disease  -on observation, plan to f/u in 3 months with repeat imaging   Disposition: Ms. Gauer appears stable. She continues Aranesp 200 mcg q2 months. Hgb is stable, 9.8; she is asymptomatic other than fatigue. I reviewed risk of Aranesp including thrombosis, stroke, heart attack, and risk of tumor progression. She recently has CT evidence of disease progression of angiosarcoma, now with bilateral sub-cm pulmonary metastasis. Given this, and in light of recent elevated SBP >200, I recommend to hold aranesp today. Will check iron to r/o other cause for her anemia such as nutritional.   I discussed with the case with Dr. Burr Medico. Ms. Westhoff and I reviewed the option to monitor her CBC and continue Aranesp once her BP is more stable, assuming the risk of potential cancer progression, vs close observation and possible RBC transfusion PRN if she has worsening anemia. She wishes to discuss with her daughter and asked me to call her. I called her twice but no answer.   I eventually did speak with her daughter Norwood Levo on 09/15/18. She feels her mother is doing well, other than fatigue, and has a good QOL. She is under the impression the tumor growth in the lungs is minimal, but not lacking importance. She understands the risk of tumor growth on Aranesp, but also realizes the health risk of worsening anemia. After discussion with her, I recommend to repeat CBC in one month then re-evaluate whether to continue aranesp vs observation. She agrees. She requests we call to update her after the next visit with her mother, not during, as pt gets distracted easily.    Orders Placed This Encounter  Procedures  . Iron and TIBC    Standing Status:   Standing    Number of Occurrences:   1    Standing Expiration Date:   09/14/2019  . Ferritin    Standing Status:   Standing    Number of Occurrences:   1  Standing Expiration Date:   09/14/2019   All questions were answered. The patient knows to call the clinic with any problems, questions or concerns. No barriers to learning was detected. I spent 20 minutes counseling the patient face to face. The total time spent in the appointment was 25 minutes and more than 50% was on counseling and review of test results     Alla Feeling, NP 09/15/18

## 2018-09-14 ENCOUNTER — Inpatient Hospital Stay: Payer: Medicare Other

## 2018-09-14 ENCOUNTER — Ambulatory Visit: Payer: Self-pay

## 2018-09-14 ENCOUNTER — Inpatient Hospital Stay: Payer: Medicare Other | Attending: Hematology

## 2018-09-14 ENCOUNTER — Encounter: Payer: Self-pay | Admitting: Nurse Practitioner

## 2018-09-14 ENCOUNTER — Other Ambulatory Visit: Payer: Self-pay

## 2018-09-14 ENCOUNTER — Inpatient Hospital Stay (HOSPITAL_BASED_OUTPATIENT_CLINIC_OR_DEPARTMENT_OTHER): Payer: Medicare Other | Admitting: Nurse Practitioner

## 2018-09-14 VITALS — BP 142/67 | HR 73 | Temp 99.1°F | Resp 17 | Wt 147.9 lb

## 2018-09-14 DIAGNOSIS — D631 Anemia in chronic kidney disease: Secondary | ICD-10-CM | POA: Insufficient documentation

## 2018-09-14 DIAGNOSIS — I252 Old myocardial infarction: Secondary | ICD-10-CM | POA: Insufficient documentation

## 2018-09-14 DIAGNOSIS — N189 Chronic kidney disease, unspecified: Secondary | ICD-10-CM

## 2018-09-14 DIAGNOSIS — C7800 Secondary malignant neoplasm of unspecified lung: Secondary | ICD-10-CM | POA: Diagnosis not present

## 2018-09-14 DIAGNOSIS — M79605 Pain in left leg: Secondary | ICD-10-CM | POA: Insufficient documentation

## 2018-09-14 DIAGNOSIS — I2510 Atherosclerotic heart disease of native coronary artery without angina pectoris: Secondary | ICD-10-CM | POA: Insufficient documentation

## 2018-09-14 DIAGNOSIS — N183 Chronic kidney disease, stage 3 (moderate): Secondary | ICD-10-CM | POA: Insufficient documentation

## 2018-09-14 DIAGNOSIS — F418 Other specified anxiety disorders: Secondary | ICD-10-CM | POA: Diagnosis not present

## 2018-09-14 DIAGNOSIS — M109 Gout, unspecified: Secondary | ICD-10-CM | POA: Diagnosis not present

## 2018-09-14 DIAGNOSIS — M545 Low back pain: Secondary | ICD-10-CM | POA: Insufficient documentation

## 2018-09-14 DIAGNOSIS — K219 Gastro-esophageal reflux disease without esophagitis: Secondary | ICD-10-CM | POA: Diagnosis not present

## 2018-09-14 DIAGNOSIS — Z7982 Long term (current) use of aspirin: Secondary | ICD-10-CM | POA: Insufficient documentation

## 2018-09-14 DIAGNOSIS — E559 Vitamin D deficiency, unspecified: Secondary | ICD-10-CM | POA: Diagnosis not present

## 2018-09-14 DIAGNOSIS — C490 Malignant neoplasm of connective and soft tissue of head, face and neck: Secondary | ICD-10-CM | POA: Diagnosis not present

## 2018-09-14 DIAGNOSIS — I509 Heart failure, unspecified: Secondary | ICD-10-CM | POA: Diagnosis not present

## 2018-09-14 DIAGNOSIS — I129 Hypertensive chronic kidney disease with stage 1 through stage 4 chronic kidney disease, or unspecified chronic kidney disease: Secondary | ICD-10-CM | POA: Insufficient documentation

## 2018-09-14 DIAGNOSIS — Z79899 Other long term (current) drug therapy: Secondary | ICD-10-CM | POA: Insufficient documentation

## 2018-09-14 DIAGNOSIS — E785 Hyperlipidemia, unspecified: Secondary | ICD-10-CM | POA: Diagnosis not present

## 2018-09-14 LAB — CBC WITH DIFFERENTIAL/PLATELET
Abs Immature Granulocytes: 0.01 10*3/uL (ref 0.00–0.07)
Basophils Absolute: 0 10*3/uL (ref 0.0–0.1)
Basophils Relative: 1 %
Eosinophils Absolute: 0.2 10*3/uL (ref 0.0–0.5)
Eosinophils Relative: 4 %
HCT: 29.5 % — ABNORMAL LOW (ref 36.0–46.0)
Hemoglobin: 9.8 g/dL — ABNORMAL LOW (ref 12.0–15.0)
Immature Granulocytes: 0 %
Lymphocytes Relative: 18 %
Lymphs Abs: 0.7 10*3/uL (ref 0.7–4.0)
MCH: 31.3 pg (ref 26.0–34.0)
MCHC: 33.2 g/dL (ref 30.0–36.0)
MCV: 94.2 fL (ref 80.0–100.0)
Monocytes Absolute: 0.5 10*3/uL (ref 0.1–1.0)
Monocytes Relative: 12 %
Neutro Abs: 2.6 10*3/uL (ref 1.7–7.7)
Neutrophils Relative %: 65 %
Platelets: 204 10*3/uL (ref 150–400)
RBC: 3.13 MIL/uL — ABNORMAL LOW (ref 3.87–5.11)
RDW: 13.2 % (ref 11.5–15.5)
WBC: 4 10*3/uL (ref 4.0–10.5)
nRBC: 0 % (ref 0.0–0.2)

## 2018-09-15 ENCOUNTER — Encounter: Payer: Self-pay | Admitting: Nurse Practitioner

## 2018-09-15 ENCOUNTER — Telehealth: Payer: Self-pay | Admitting: Nurse Practitioner

## 2018-09-15 NOTE — Telephone Encounter (Signed)
No los per 8/24. °

## 2018-09-16 ENCOUNTER — Telehealth: Payer: Self-pay | Admitting: Hematology

## 2018-09-16 NOTE — Telephone Encounter (Signed)
Scheduled appt per 8/25 sch message - pt is aware of appt date and time

## 2018-09-21 ENCOUNTER — Other Ambulatory Visit (HOSPITAL_COMMUNITY): Payer: Self-pay | Admitting: Internal Medicine

## 2018-09-21 DIAGNOSIS — I779 Disorder of arteries and arterioles, unspecified: Secondary | ICD-10-CM

## 2018-09-23 ENCOUNTER — Telehealth: Payer: Self-pay | Admitting: *Deleted

## 2018-09-23 NOTE — Telephone Encounter (Signed)
I see Dr. Jenny Reichmann placed referral to [facility] and they will contact pt. Did you want to send her somewhere else?

## 2018-09-23 NOTE — Telephone Encounter (Signed)
Copied from [place] [phone]. Topic: General - Inquiry >> Sep 23, 2018 10:23 AM Virl Axe D wrote: Reason for CRM: Pt stated that Dr. Jenny Reichmann told her she needed to see a kidney doctor and Dr. Alain Marion could help her find one. She is requesting a call back. Please advise. CB#[phone]

## 2018-09-29 ENCOUNTER — Other Ambulatory Visit (HOSPITAL_COMMUNITY): Payer: Self-pay | Admitting: Internal Medicine

## 2018-09-29 ENCOUNTER — Other Ambulatory Visit: Payer: Self-pay

## 2018-09-29 ENCOUNTER — Ambulatory Visit (HOSPITAL_COMMUNITY)
Admission: RE | Admit: 2018-09-29 | Discharge: 2018-09-29 | Disposition: A | Discharge status: Home / Self Care | Payer: Medicare Other | Source: Ambulatory Visit | Attending: Cardiovascular Disease | Admitting: Cardiovascular Disease

## 2018-09-29 DIAGNOSIS — I779 Disorder of arteries and arterioles, unspecified: Secondary | ICD-10-CM

## 2018-09-29 DIAGNOSIS — I739 Peripheral vascular disease, unspecified: Secondary | ICD-10-CM | POA: Diagnosis not present

## 2018-09-29 DIAGNOSIS — Z9889 Other specified postprocedural states: Secondary | ICD-10-CM

## 2018-09-30 NOTE — Telephone Encounter (Signed)
Pt stated that Dr. Jenny Reichmann told her she needed to see a kidney doctor  in a letter that he sent to her and she wants Dr. Alain Marion to help her find one. Patient states that she have not heard from anyone as of yet and would like Dr Alain Marion to call her back. Please advise. CB#[phone]

## 2018-09-30 NOTE — Telephone Encounter (Signed)
Pt given [facility] phone number

## 2018-10-10 ENCOUNTER — Ambulatory Visit (INDEPENDENT_AMBULATORY_CARE_PROVIDER_SITE_OTHER): Payer: Medicare Other

## 2018-10-10 DIAGNOSIS — Z23 Encounter for immunization: Secondary | ICD-10-CM

## 2018-10-11 ENCOUNTER — Other Ambulatory Visit: Payer: Self-pay | Admitting: Internal Medicine

## 2018-10-14 NOTE — Progress Notes (Signed)
[place]   [phone]) [phone] [phone]) [phone]   Clinic Follow up Note   Patient Care Team: Plotnikov, [name], MD as PCP - General (Internal Medicine) Josue Hector, MD as PCP - Cardiology (Cardiology) Josue Hector, MD as Consulting Physician (Cardiology) Clemon Chambers, MD (Radiology) [name], MD as Consulting Physician (Hematology)  Date of Service:  10/16/2018  CHIEF COMPLAINT: F/u anemia   CURRENT THERAPY:  Aranesp 200 mcg subcutaneous every 2 months for hemoglobin less than 11.  INTERVAL HISTORY:  [name] [name] is here for a follow up anemia. She presents to the clinic alone. She notes she is doing well. She notes she still lives by herself and drives herself. She is able to take care of her house and herself. Her daughters buy groceries for her. She notes she is not ready to leave her house. Her daughter lives 24 miles from her. She built an extra room in her house which is ready for her when she is ready to move out of her own home. She notes light cough recently. She notes she had flu shot this year already.    REVIEW OF SYSTEMS:   Constitutional: Denies fevers, chills or abnormal weight loss Eyes: Denies blurriness of vision Ears, nose, mouth, throat, and face: Denies mucositis or sore throat Respiratory: Denies dyspnea or wheezes (+) Light cough  Cardiovascular: Denies palpitation, chest discomfort or lower extremity swelling Gastrointestinal:  Denies nausea, heartburn or change in bowel habits Skin: Denies abnormal skin rashes Lymphatics: Denies new lymphadenopathy or easy bruising Neurological:Denies numbness, tingling or new weaknesses Behavioral/Psych: Mood is stable, no new changes  All other systems were reviewed with the patient and are negative.  MEDICAL HISTORY:  Past Medical History:  Diagnosis Date   Anemia, unspecified    Angiosarcoma ([place])    dx in April    Anxiety state, unspecified    Cancer ([place])    CKD  (chronic kidney disease) stage 3, GFR 30-59 ml/min ([place]) 09/04/2018   Depressive disorder, not elsewhere classified    Diverticulosis of colon (without mention of hemorrhage)    Diverticulosis of colon with hemorrhage    Esophageal reflux    History of echocardiogram    Echo 8/18: EF 55-60, normal wall motion, grade 2 diastolic dysfunction, mild AI, mild MR, PASP 32   Lumbago    Myocardial infarction ([place])    Other and unspecified hyperlipidemia    Persistent disorder of initiating or maintaining sleep    Thyroid disease    Unspecified asthma(493.90)    Unspecified disorder resulting from impaired renal function    Unspecified essential hypertension    Dr [name]   Unspecified osteomyelitis, site unspecified    Unspecified vitamin D deficiency     SURGICAL HISTORY: Past Surgical History:  Procedure Laterality Date   BONE MARROW BIOPSY     CORONARY ARTERY BYPASS GRAFT  03/25/13   x4. CABS ON PUMP-Surgeon Jomarie Longs.   PARTIAL COLECTOMY  1995   diverticulosis of colon iwth hemorrhage.    status post right carotid enterectomy      I have reviewed the social history and family history with the patient and they are unchanged from previous note.  ALLERGIES:  is allergic to codeine sulfate; darvon [propoxyphene]; propoxyphene napsylate; sulfacetamide sodium-sulfur; sulfamethoxazole-trimethoprim; and zolpidem.  MEDICATIONS:  Current Outpatient Medications  Medication Sig Dispense Refill   acetaminophen (TYLENOL) 325 MG tablet Take 2 tablets (650 mg total) by mouth every 6 (six) hours as needed for  mild pain (or Fever >/= 101). 30 tablet 0   albuterol (PROVENTIL HFA;VENTOLIN HFA) 108 (90 Base) MCG/ACT inhaler Inhale 1-2 puffs into the lungs every 6 (six) hours as needed for wheezing or shortness of breath. 1 Inhaler 1   allopurinol (ZYLOPRIM) 100 MG tablet TAKE 1 TABLET BY MOUTH EVERY DAY 90 tablet 1   aspirin EC 81 MG tablet Take 1 tablet (81 mg total) by mouth  daily. 90 tablet 3   atorvastatin (LIPITOR) 40 MG tablet TAKE 1 TABLET (40 MG TOTAL) BY MOUTH DAILY AT 6 PM. -- OFFICE VISIT NEEDED FOR FURTHER REFILLS 90 tablet 3   b complex vitamins capsule Take 1 capsule by mouth daily.     Biotin 5000 MCG CAPS Take by mouth 1 day or 1 dose.     Cholecalciferol (EQL VITAMIN D3) 1000 UNITS tablet Take 1 tablet (1,000 Units total) by mouth daily. 100 tablet 3   darbepoetin (ARANESP) 100 MCG/0.5ML SOLN injection Inject 100 mcg into the skin every 8 (eight) weeks.     escitalopram (LEXAPRO) 5 MG tablet TAKE 1 TABLET BY MOUTH EVERY DAY 30 tablet 5   furosemide (LASIX) 20 MG tablet TAKE 1 TABLET BY MOUTH EVERY DAY 90 tablet 3   gabapentin (NEURONTIN) 100 MG capsule Take 1 capsule (100 mg total) by mouth 3 (three) times daily. 90 capsule 3   levothyroxine (SYNTHROID) 50 MCG tablet TAKE 1 TABLET (50 MCG TOTAL) BY MOUTH DAILY BEFORE BREAKFAST. MUST KEEP APPT ON 04/02/18 90 tablet 3   LORazepam (ATIVAN) 0.5 MG tablet Take 1 tablet (0.5 mg total) by mouth at bedtime. 90 tablet 1   metoprolol tartrate (LOPRESSOR) 25 MG tablet TAKE 1/2 TABLET BY MOUTH TWICE A DAY 90 tablet 3   pantoprazole (PROTONIX) 40 MG tablet Take 1 tablet (40 mg total) by mouth daily as needed. 90 tablet 2   polyethylene glycol powder (GLYCOLAX/MIRALAX) powder Take 17 g by mouth 2 (two) times daily as needed for mild constipation or moderate constipation. 500 g 3   traMADol (ULTRAM) 50 MG tablet Take 1 tablet (50 mg total) by mouth every 6 (six) hours as needed. 30 tablet 0   triamcinolone cream (KENALOG) 0.1 % Apply 1 application topically 2 (two) times daily. 45 g 1   budesonide-formoterol (SYMBICORT) 160-4.5 MCG/ACT inhaler Inhale 2 puffs into the lungs 2 (two) times daily. 1 Inhaler 3   No current facility-administered medications for this visit.    Facility-Administered Medications Ordered in Other Visits  Medication Dose Route Frequency Provider Last Rate Last Dose    Darbepoetin Alfa (ARANESP) injection 200 mcg  200 mcg Subcutaneous Once [name], MD       Darbepoetin Alfa Kyra Searles) injection 500 mcg  500 mcg Subcutaneous Once [name], MD        PHYSICAL EXAMINATION: ECOG PERFORMANCE STATUS: 1 - Symptomatic but completely ambulatory  Vitals:   10/16/18 1305  BP: (!) 159/69  Pulse: 72  Resp: 18  Temp: 98.5 F (36.9 C)  SpO2: 100%   Filed Weights   10/16/18 1305  Weight: 150 lb 4.8 oz (68.2 kg)    GENERAL:alert, no distress and comfortable SKIN: skin color, texture, turgor are normal, no rashes or significant lesions EYES: normal, Conjunctiva are pink and non-injected, sclera clear  NECK: supple, thyroid normal size, non-tender, without nodularity LYMPH:  no palpable lymphadenopathy in the cervical, axillary  LUNGS: clear to auscultation and percussion with normal breathing effort HEART: regular rate & rhythm and no murmurs and  no lower extremity edema ABDOMEN:abdomen soft, non-tender and normal bowel sounds Musculoskeletal:no cyanosis of digits and no clubbing  NEURO: alert & oriented x 3 with fluent speech, no focal motor/sensory deficits  LABORATORY DATA:  I have reviewed the data as listed CBC Latest Ref Rng & Units 10/16/2018 09/14/2018 07/15/2018  WBC 4.0 - 10.5 K/uL 4.9 4.0 5.0  Hemoglobin 12.0 - 15.0 g/dL 9.1(L) 9.8(L) 9.8(L)  Hematocrit 36.0 - 46.0 % 27.6(L) 29.5(L) 30.1(L)  Platelets 150 - 400 K/uL 256 204 234     CMP Latest Ref Rng & Units 09/04/2018 11/07/2017 03/20/2017  Glucose 70 - 99 mg/dL 105(H) 95 84  BUN 6 - 23 mg/dL 35(H) 33(H) 40(H)  Creatinine 0.40 - 1.20 mg/dL 1.87(H) 1.93(H) 2.01(H)  Sodium 135 - 145 mEq/L 138 137 140  Potassium 3.5 - 5.1 mEq/L 4.0 3.6 3.9  Chloride 96 - 112 mEq/L 104 98 104  CO2 19 - 32 mEq/L '28 29 26  ' Calcium 8.4 - 10.5 mg/dL 9.0 9.6 9.2  Total Protein 6.0 - 8.3 g/dL - 7.3 6.6  Total Bilirubin 0.2 - 1.2 mg/dL - 0.6 0.6  Alkaline Phos 39 - 117 U/L - 77 100  AST 0 - 37 U/L - 25 26  ALT 0  - 35 U/L - 14 16      RADIOGRAPHIC STUDIES: I have personally reviewed the radiological images as listed and agreed with the findings in the report. No results found.   ASSESSMENT & PLAN:  [name] [name] is a 85 y.o. female with   1. Anemia of chronic disease (CKD).   -She was able to maintain her hemoglobin well with Aranesp every 2-3 months.  -We held her Aranesp injection after 07/15/18 due to her recent angiosarcoma progression -pt clearly states that she is not planning to have treatment for her angiosarcoma, and her goal of care if palliative, to maintain her quality of life.  -I discussed restarting Aranesp given her fatigue and symptoms from anemia. I explained the risk and benefit of it, especially the risk of thrombosis and chemicals.  She feels Aranesp has been helping her energy level and would like to continue.given the overall palliative goal of her care, I think is reasonable to continue Aranesp.  -From an anemia standpoint her Hg dropped after holding aranesp. CBC shows Hg 9.1, iron panel is still pending. Will restart Aranesp injection today.  -continue lab and injection every 2 months if Hb<11.0 -F/u in13month  -She had flu shot this year already.   2. Chronic kidney disease.  -Avoid nephrotoxins and previously counseled on continued adequate hydration.  -I previously advised the patient that she could probably discontinue Lasix if she is no longer experiencing lower extremity edema. This could be contributing to her decreased kidney function.    3. Gout  -I previously encouraged her to follow-up with her primary care physician, and discussed if she would benefit from allopurinol to prevent gout flare -Her physician related her gout to hereditary trait. She watches what she eats as this can effect her gout flaring.   4. CAD, CHF, HTN, ARTHRITIS  -F/u with cardiologist   5. Hypertension -Encouraged the patient to check blood pressure regularly at home -  F/u PCP  6. Osteoporosis - I previously reviewed the patient's history of hip fracture and many compression fractures in the lumbar spine. 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 Milana Na. She was seen by Dr. Angelina Ok and chemo was not recommended due to her age and medical comorbidities -f/u with Dr. Angelina Ok  -Pt fully understands her goal of care is palliative   8. Social Support  -She lives alone and drives herself.  -1 daughter lives in [place] and the other daughter lives in [place]  -She notes her daughter added a room to her home which is ready for in [place] when she is ready to move  -She notes she has made her funeral arrangements and is preparing for her last moments.  -I discussed hospice, when she needs more care at home. Since she is on Aranesp injection now and she is overall feeling well, will hold on referral.  Plan  -Labs reviewed, Hg 9.1 today. Proceed with  Aranesp today  -Aranesp every 2 months with labs, If Hb<11.0 -Follow up in20month. -I called her daughter PMardene Celesteabout her visit today    No problem-specific Assessment & Plan notes found for this encounter.   No orders of the defined types were placed in this encounter.  All questions were answered. The patient knows to call the clinic with any problems, questions or concerns. No barriers to learning was detected. I spent 20 minutes counseling the patient face to face. The total time spent in the appointment was 25 minutes and more than 50% was on counseling and review of test results     YTruitt Merle MD 10/16/2018   I, AJoslyn Devon am acting as scribe for YTruitt Merle MD.   I have reviewed the above documentation for accuracy and completeness, and I agree with the above.

## 2018-10-16 ENCOUNTER — Encounter: Payer: Self-pay | Admitting: Hematology

## 2018-10-16 ENCOUNTER — Inpatient Hospital Stay: Payer: Medicare Other | Attending: Hematology | Admitting: Hematology

## 2018-10-16 ENCOUNTER — Inpatient Hospital Stay: Payer: Medicare Other

## 2018-10-16 ENCOUNTER — Other Ambulatory Visit: Payer: Self-pay

## 2018-10-16 VITALS — BP 159/69 | HR 72 | Temp 98.5°F | Resp 18 | Ht 62.0 in | Wt 150.3 lb

## 2018-10-16 DIAGNOSIS — I2510 Atherosclerotic heart disease of native coronary artery without angina pectoris: Secondary | ICD-10-CM | POA: Insufficient documentation

## 2018-10-16 DIAGNOSIS — I129 Hypertensive chronic kidney disease with stage 1 through stage 4 chronic kidney disease, or unspecified chronic kidney disease: Secondary | ICD-10-CM | POA: Insufficient documentation

## 2018-10-16 DIAGNOSIS — F329 Major depressive disorder, single episode, unspecified: Secondary | ICD-10-CM | POA: Diagnosis not present

## 2018-10-16 DIAGNOSIS — M109 Gout, unspecified: Secondary | ICD-10-CM | POA: Diagnosis not present

## 2018-10-16 DIAGNOSIS — D631 Anemia in chronic kidney disease: Secondary | ICD-10-CM

## 2018-10-16 DIAGNOSIS — Z7982 Long term (current) use of aspirin: Secondary | ICD-10-CM | POA: Insufficient documentation

## 2018-10-16 DIAGNOSIS — C787 Secondary malignant neoplasm of liver and intrahepatic bile duct: Secondary | ICD-10-CM | POA: Insufficient documentation

## 2018-10-16 DIAGNOSIS — M810 Age-related osteoporosis without current pathological fracture: Secondary | ICD-10-CM | POA: Insufficient documentation

## 2018-10-16 DIAGNOSIS — C4440 Unspecified malignant neoplasm of skin of scalp and neck: Secondary | ICD-10-CM | POA: Insufficient documentation

## 2018-10-16 DIAGNOSIS — K219 Gastro-esophageal reflux disease without esophagitis: Secondary | ICD-10-CM | POA: Diagnosis not present

## 2018-10-16 DIAGNOSIS — E559 Vitamin D deficiency, unspecified: Secondary | ICD-10-CM | POA: Diagnosis not present

## 2018-10-16 DIAGNOSIS — Z79899 Other long term (current) drug therapy: Secondary | ICD-10-CM | POA: Insufficient documentation

## 2018-10-16 DIAGNOSIS — I130 Hypertensive heart and chronic kidney disease with heart failure and stage 1 through stage 4 chronic kidney disease, or unspecified chronic kidney disease: Secondary | ICD-10-CM | POA: Diagnosis not present

## 2018-10-16 DIAGNOSIS — N183 Chronic kidney disease, stage 3 (moderate): Secondary | ICD-10-CM | POA: Diagnosis not present

## 2018-10-16 DIAGNOSIS — E079 Disorder of thyroid, unspecified: Secondary | ICD-10-CM | POA: Diagnosis not present

## 2018-10-16 DIAGNOSIS — N184 Chronic kidney disease, stage 4 (severe): Secondary | ICD-10-CM | POA: Diagnosis not present

## 2018-10-16 DIAGNOSIS — E785 Hyperlipidemia, unspecified: Secondary | ICD-10-CM | POA: Diagnosis not present

## 2018-10-16 DIAGNOSIS — I10 Essential (primary) hypertension: Secondary | ICD-10-CM | POA: Insufficient documentation

## 2018-10-16 DIAGNOSIS — I252 Old myocardial infarction: Secondary | ICD-10-CM | POA: Insufficient documentation

## 2018-10-16 DIAGNOSIS — M1990 Unspecified osteoarthritis, unspecified site: Secondary | ICD-10-CM | POA: Diagnosis not present

## 2018-10-16 DIAGNOSIS — N189 Chronic kidney disease, unspecified: Secondary | ICD-10-CM

## 2018-10-16 LAB — IRON AND TIBC
Iron: 85 ug/dL (ref 41–142)
Saturation Ratios: 35 % (ref 21–57)
TIBC: 244 ug/dL (ref 236–444)
UIBC: 159 ug/dL (ref 120–384)

## 2018-10-16 LAB — CBC WITH DIFFERENTIAL/PLATELET
Abs Immature Granulocytes: 0.01 10*3/uL (ref 0.00–0.07)
Basophils Absolute: 0 10*3/uL (ref 0.0–0.1)
Basophils Relative: 1 %
Eosinophils Absolute: 0.2 10*3/uL (ref 0.0–0.5)
Eosinophils Relative: 4 %
HCT: 27.6 % — ABNORMAL LOW (ref 36.0–46.0)
Hemoglobin: 9.1 g/dL — ABNORMAL LOW (ref 12.0–15.0)
Immature Granulocytes: 0 %
Lymphocytes Relative: 13 %
Lymphs Abs: 0.6 10*3/uL — ABNORMAL LOW (ref 0.7–4.0)
MCH: 31.5 pg (ref 26.0–34.0)
MCHC: 33 g/dL (ref 30.0–36.0)
MCV: 95.5 fL (ref 80.0–100.0)
Monocytes Absolute: 0.4 10*3/uL (ref 0.1–1.0)
Monocytes Relative: 9 %
Neutro Abs: 3.6 10*3/uL (ref 1.7–7.7)
Neutrophils Relative %: 73 %
Platelets: 256 10*3/uL (ref 150–400)
RBC: 2.89 MIL/uL — ABNORMAL LOW (ref 3.87–5.11)
RDW: 13.4 % (ref 11.5–15.5)
WBC: 4.9 10*3/uL (ref 4.0–10.5)
nRBC: 0 % (ref 0.0–0.2)

## 2018-10-16 LAB — FERRITIN: Ferritin: 169 ng/mL (ref 11–307)

## 2018-10-16 MED ORDER — DARBEPOETIN ALFA 200 MCG/0.4ML IJ SOSY
PREFILLED_SYRINGE | INTRAMUSCULAR | Status: AC
  Filled 2018-10-16: qty 0.4

## 2018-10-16 MED ORDER — DARBEPOETIN ALFA 200 MCG/0.4ML IJ SOSY
200.0000 ug | PREFILLED_SYRINGE | Freq: Once | INTRAMUSCULAR | Status: AC
  Administered 2018-10-16: 200 ug via SUBCUTANEOUS

## 2018-10-16 NOTE — Patient Instructions (Signed)

## 2018-10-18 ENCOUNTER — Encounter: Payer: Self-pay | Admitting: Hematology

## 2018-10-19 ENCOUNTER — Telehealth: Payer: Self-pay | Admitting: Hematology

## 2018-10-19 NOTE — Telephone Encounter (Signed)
No los per 9/25

## 2018-10-19 NOTE — Telephone Encounter (Signed)
Scheduled appt per 9/27 sch message.  Sent a message and a calendar will be mailed out.

## 2018-10-20 ENCOUNTER — Telehealth: Payer: Self-pay

## 2018-10-20 NOTE — Telephone Encounter (Signed)
TC to pt per [name] NP to let her know that her  iron and ferritin are normal. Patient verbalized understanding. No further problems or concerns at this time.

## 2018-10-21 ENCOUNTER — Other Ambulatory Visit: Payer: Self-pay | Admitting: Internal Medicine

## 2018-10-26 ENCOUNTER — Encounter: Payer: Self-pay | Admitting: Podiatry

## 2018-10-26 ENCOUNTER — Other Ambulatory Visit: Payer: Self-pay

## 2018-10-26 ENCOUNTER — Ambulatory Visit (INDEPENDENT_AMBULATORY_CARE_PROVIDER_SITE_OTHER): Payer: Medicare Other | Admitting: Podiatry

## 2018-10-26 DIAGNOSIS — G629 Polyneuropathy, unspecified: Secondary | ICD-10-CM | POA: Diagnosis not present

## 2018-10-26 DIAGNOSIS — M79676 Pain in unspecified toe(s): Secondary | ICD-10-CM | POA: Diagnosis not present

## 2018-10-26 DIAGNOSIS — L84 Corns and callosities: Secondary | ICD-10-CM

## 2018-10-26 DIAGNOSIS — B351 Tinea unguium: Secondary | ICD-10-CM

## 2018-10-26 MED ORDER — NONFORMULARY OR COMPOUNDED ITEM: 3 refills | Status: AC

## 2018-10-26 NOTE — Patient Instructions (Signed)

## 2018-10-28 NOTE — Progress Notes (Signed)
Subjective: [name] [name] is seen today for follow up painful, elongated, thickened toenails 1-5 b/l feet that she cannot cut. Pain interferes with daily activities. Aggravating factor includes wearing enclosed shoe gear and relieved with periodic debridement.  She relates she is being followed at [facility] for metastatic angiosarcoma of the scalp. Cancer has metastasized to her lungs. Her next treatment at [facility] is scheduled for November 19th.  She is interested in medication for her fungal toenails.   Current Outpatient Medications on File Prior to Visit  Medication Sig  . acetaminophen (TYLENOL) 325 MG tablet Take 2 tablets (650 mg total) by mouth every 6 (six) hours as needed for mild pain (or Fever >/= 101).  [name] albuterol (PROVENTIL HFA;VENTOLIN HFA) 108 (90 Base) MCG/ACT inhaler Inhale 1-2 puffs into the lungs every 6 (six) hours as needed for wheezing or shortness of breath.  . allopurinol (ZYLOPRIM) 100 MG tablet TAKE 1 TABLET BY MOUTH EVERY DAY  . aspirin EC 81 MG tablet Take 1 tablet (81 mg total) by mouth daily.  [name] atorvastatin (LIPITOR) 40 MG tablet TAKE 1 TABLET (40 MG TOTAL) BY MOUTH DAILY AT 6 PM. -- OFFICE VISIT NEEDED FOR FURTHER REFILLS  . b complex vitamins capsule Take 1 capsule by mouth daily.  . Biotin 5000 MCG CAPS Take by mouth 1 day or 1 dose.  . budesonide-formoterol (SYMBICORT) 160-4.5 MCG/ACT inhaler Inhale 2 puffs into the lungs 2 (two) times daily.  . Cholecalciferol (EQL VITAMIN D3) 1000 UNITS tablet Take 1 tablet (1,000 Units total) by mouth daily.  . darbepoetin (ARANESP) 100 MCG/0.5ML SOLN injection Inject 100 mcg into the skin every 8 (eight) weeks.  [name] escitalopram (LEXAPRO) 5 MG tablet TAKE 1 TABLET BY MOUTH EVERY DAY  . furosemide (LASIX) 20 MG tablet TAKE 1 TABLET BY MOUTH EVERY DAY  . gabapentin (NEURONTIN) 100 MG capsule Take 1 capsule (100 mg total) by mouth 3 (three) times daily.  [name] levothyroxine (SYNTHROID) 50 MCG tablet TAKE 1 TABLET (50 MCG  TOTAL) BY MOUTH DAILY BEFORE BREAKFAST. MUST KEEP APPT ON 04/02/18  . LORazepam (ATIVAN) 0.5 MG tablet Take 1 tablet (0.5 mg total) by mouth at bedtime.  . metoprolol tartrate (LOPRESSOR) 25 MG tablet TAKE 1/2 TABLET BY MOUTH TWICE A DAY  . pantoprazole (PROTONIX) 40 MG tablet Take 1 tablet (40 mg total) by mouth daily as needed.  . polyethylene glycol powder (GLYCOLAX/MIRALAX) powder Take 17 g by mouth 2 (two) times daily as needed for mild constipation or moderate constipation.  . traMADol (ULTRAM) 50 MG tablet Take 1 tablet (50 mg total) by mouth every 6 (six) hours as needed.  . triamcinolone cream (KENALOG) 0.1 % Apply 1 application topically 2 (two) times daily.   Current Facility-Administered Medications on File Prior to Visit  Medication  . Darbepoetin Alfa (ARANESP) injection 200 mcg  . Darbepoetin Alfa (ARANESP) injection 500 mcg     Allergies  Allergen Reactions  . Codeine Sulfate Nausea And Vomiting  . Darvon [Propoxyphene] Nausea And Vomiting    hallucinations  . Propoxyphene Napsylate Nausea And Vomiting  . Sulfacetamide Sodium-Sulfur Nausea And Vomiting  . Sulfamethoxazole-Trimethoprim Nausea Only    Can't remember reaction  . Zolpidem     Other reaction(s): Mental Status Changes (intolerance)    Objective:  Vascular Examination: Capillary refill time immediate x 10 digits.  Dorsalis pedis present b/l.  Posterior tibial pulses present b/l.  Digital hair remains sparse b/l.   Skin temperature gradient WNL b/l.   Dermatological  Examination: Skin with normal turgor, texture and tone b/l.  Toenails 1-5 b/l discolored, thick, dystrophic with subungual debris and pain with palpation to nailbeds due to thickness of nails.  Hyperkeratotic lesion submet head 1 b/l with tenderness to palpation. No edema, no erythema, no drainage, no flocculence.  Musculoskeletal: Muscle strength 5/5 to all LE muscle groups b/l.  HAV with bunion b/l.  Hammertoes 2-5 b/l.  No  pain, crepitus or joint limitation noted with ROM.   Neurological Examination: Protective sensation decreased with 10 gram monofilament bilaterally.  Vibratory sensation intact bilaterally.   Assessment: Painful onychomycosis toenails 1-5 b/l  Calluses submet head 1 b/l Neuropathy  Plan: 1. Toenails 1-5 b/l were debrided in length and girth without iatrogenic bleeding. Rx written for nonformulary compounding topical antifungal: [facility]: Antifungal cream - Terbinafine 3%, Fluconazole 2%, Tea Tree Oil 5%, Urea 10%, Ibuprofen 2% in DMSO Suspension #34ml. Apply to the affected nail(s) at bedtime. Calluses pared submetatarsal head(s) 1 b/l utilizing sterile scalpel blade without incident. Patient to continue soft, supportive shoe gear daily Patient to report any pedal injuries to medical professional immediately. Follow up 3 months.  Patient/POA to call should there be a concern in the interim.

## 2018-12-02 DIAGNOSIS — R600 Localized edema: Secondary | ICD-10-CM | POA: Diagnosis not present

## 2018-12-09 DIAGNOSIS — Z7189 Other specified counseling: Secondary | ICD-10-CM | POA: Diagnosis not present

## 2018-12-09 DIAGNOSIS — C7802 Secondary malignant neoplasm of left lung: Secondary | ICD-10-CM | POA: Diagnosis not present

## 2018-12-09 DIAGNOSIS — C499 Malignant neoplasm of connective and soft tissue, unspecified: Secondary | ICD-10-CM | POA: Diagnosis not present

## 2018-12-09 DIAGNOSIS — R918 Other nonspecific abnormal finding of lung field: Secondary | ICD-10-CM | POA: Diagnosis not present

## 2018-12-09 DIAGNOSIS — C7801 Secondary malignant neoplasm of right lung: Secondary | ICD-10-CM | POA: Diagnosis not present

## 2018-12-10 ENCOUNTER — Telehealth: Payer: Self-pay

## 2018-12-10 ENCOUNTER — Other Ambulatory Visit: Payer: Self-pay

## 2018-12-10 DIAGNOSIS — N189 Chronic kidney disease, unspecified: Secondary | ICD-10-CM

## 2018-12-10 DIAGNOSIS — C490 Malignant neoplasm of connective and soft tissue of head, face and neck: Secondary | ICD-10-CM

## 2018-12-10 DIAGNOSIS — D631 Anemia in chronic kidney disease: Secondary | ICD-10-CM

## 2018-12-10 NOTE — Progress Notes (Signed)
ambulator 

## 2018-12-10 NOTE — Telephone Encounter (Signed)
Received call from patients daughter [name] stating she has been released from [facility] due to new tumors.  Hospice is recommended and she is requesting Dr. Burr Medico order this.  This has been done and records, demographics have been faxed with her daughter as the main contact to [facility].

## 2018-12-12 DIAGNOSIS — I252 Old myocardial infarction: Secondary | ICD-10-CM | POA: Diagnosis not present

## 2018-12-12 DIAGNOSIS — F419 Anxiety disorder, unspecified: Secondary | ICD-10-CM | POA: Diagnosis not present

## 2018-12-12 DIAGNOSIS — F329 Major depressive disorder, single episode, unspecified: Secondary | ICD-10-CM | POA: Diagnosis not present

## 2018-12-12 DIAGNOSIS — C490 Malignant neoplasm of connective and soft tissue of head, face and neck: Secondary | ICD-10-CM | POA: Diagnosis not present

## 2018-12-12 DIAGNOSIS — I25709 Atherosclerosis of coronary artery bypass graft(s), unspecified, with unspecified angina pectoris: Secondary | ICD-10-CM | POA: Diagnosis not present

## 2018-12-12 DIAGNOSIS — C7802 Secondary malignant neoplasm of left lung: Secondary | ICD-10-CM | POA: Diagnosis not present

## 2018-12-12 DIAGNOSIS — J302 Other seasonal allergic rhinitis: Secondary | ICD-10-CM | POA: Diagnosis not present

## 2018-12-12 DIAGNOSIS — D630 Anemia in neoplastic disease: Secondary | ICD-10-CM | POA: Diagnosis not present

## 2018-12-12 DIAGNOSIS — Z8719 Personal history of other diseases of the digestive system: Secondary | ICD-10-CM | POA: Diagnosis not present

## 2018-12-12 DIAGNOSIS — I129 Hypertensive chronic kidney disease with stage 1 through stage 4 chronic kidney disease, or unspecified chronic kidney disease: Secondary | ICD-10-CM | POA: Diagnosis not present

## 2018-12-12 DIAGNOSIS — G629 Polyneuropathy, unspecified: Secondary | ICD-10-CM | POA: Diagnosis not present

## 2018-12-12 DIAGNOSIS — N183 Chronic kidney disease, stage 3 unspecified: Secondary | ICD-10-CM | POA: Diagnosis not present

## 2018-12-12 DIAGNOSIS — M109 Gout, unspecified: Secondary | ICD-10-CM | POA: Diagnosis not present

## 2018-12-12 DIAGNOSIS — E785 Hyperlipidemia, unspecified: Secondary | ICD-10-CM | POA: Diagnosis not present

## 2018-12-12 DIAGNOSIS — C7801 Secondary malignant neoplasm of right lung: Secondary | ICD-10-CM | POA: Diagnosis not present

## 2018-12-12 DIAGNOSIS — C7989 Secondary malignant neoplasm of other specified sites: Secondary | ICD-10-CM | POA: Diagnosis not present

## 2018-12-12 DIAGNOSIS — K219 Gastro-esophageal reflux disease without esophagitis: Secondary | ICD-10-CM | POA: Diagnosis not present

## 2018-12-12 DIAGNOSIS — R296 Repeated falls: Secondary | ICD-10-CM | POA: Diagnosis not present

## 2018-12-12 DIAGNOSIS — J45909 Unspecified asthma, uncomplicated: Secondary | ICD-10-CM | POA: Diagnosis not present

## 2018-12-13 DIAGNOSIS — I25709 Atherosclerosis of coronary artery bypass graft(s), unspecified, with unspecified angina pectoris: Secondary | ICD-10-CM | POA: Diagnosis not present

## 2018-12-13 DIAGNOSIS — C7801 Secondary malignant neoplasm of right lung: Secondary | ICD-10-CM | POA: Diagnosis not present

## 2018-12-13 DIAGNOSIS — C7802 Secondary malignant neoplasm of left lung: Secondary | ICD-10-CM | POA: Diagnosis not present

## 2018-12-13 DIAGNOSIS — C7989 Secondary malignant neoplasm of other specified sites: Secondary | ICD-10-CM | POA: Diagnosis not present

## 2018-12-13 DIAGNOSIS — C490 Malignant neoplasm of connective and soft tissue of head, face and neck: Secondary | ICD-10-CM | POA: Diagnosis not present

## 2018-12-13 DIAGNOSIS — I252 Old myocardial infarction: Secondary | ICD-10-CM | POA: Diagnosis not present

## 2018-12-14 DIAGNOSIS — I252 Old myocardial infarction: Secondary | ICD-10-CM | POA: Diagnosis not present

## 2018-12-14 DIAGNOSIS — I25709 Atherosclerosis of coronary artery bypass graft(s), unspecified, with unspecified angina pectoris: Secondary | ICD-10-CM | POA: Diagnosis not present

## 2018-12-14 DIAGNOSIS — C7801 Secondary malignant neoplasm of right lung: Secondary | ICD-10-CM | POA: Diagnosis not present

## 2018-12-14 DIAGNOSIS — C490 Malignant neoplasm of connective and soft tissue of head, face and neck: Secondary | ICD-10-CM | POA: Diagnosis not present

## 2018-12-14 DIAGNOSIS — C7989 Secondary malignant neoplasm of other specified sites: Secondary | ICD-10-CM | POA: Diagnosis not present

## 2018-12-14 DIAGNOSIS — C7802 Secondary malignant neoplasm of left lung: Secondary | ICD-10-CM | POA: Diagnosis not present

## 2018-12-15 ENCOUNTER — Telehealth: Payer: Self-pay

## 2018-12-15 NOTE — Telephone Encounter (Signed)
Spoke with patients daughter [name] and she states her mother wants to come for her appointments on Monday with Dr. Burr Medico.

## 2018-12-20 ENCOUNTER — Other Ambulatory Visit: Payer: Self-pay | Admitting: Hematology

## 2018-12-20 ENCOUNTER — Telehealth: Payer: Self-pay | Admitting: Hematology

## 2018-12-20 NOTE — Telephone Encounter (Signed)
I received a message from hospice nurse [name] [name] that pt's lab and Aranesp injection being not covered by hospice. I called [name] today, she updated me about her mother's condition. She is not in pain or has difficulty swallowing, but her tumor in her scalp is growing rapidly. Her recent CT at [facility] also showed progression of her lung metastasis. Pt has enrolled hospice last week. I think Aranesp injection is not important for her at this point (she was on every 3 month), and I encourage her to continue hospice and forgo the injection. Her daughter agreed completely. They do not plan to come in tomorrow. She greatly appreciated the call, and knows to reach out to me if she has further questions.   [name]  12/20/2018

## 2018-12-21 ENCOUNTER — Inpatient Hospital Stay: Payer: Medicare Other

## 2018-12-21 ENCOUNTER — Inpatient Hospital Stay: Payer: Medicare Other | Admitting: Hematology

## 2018-12-21 ENCOUNTER — Telehealth: Payer: Self-pay

## 2018-12-21 NOTE — Telephone Encounter (Signed)
Faxed signed orders for Hospice to fax [phone], sent to HIM for scan to chart.

## 2019-01-13 ENCOUNTER — Telehealth: Payer: Self-pay | Admitting: Internal Medicine

## 2019-01-13 NOTE — Telephone Encounter (Signed)
Insurance has been submitted and verified for Prolia. $0 copay No PA needed Due 01/13/2019 or after.  Called patient to schedule. She said that she did not know if she would need to continue Prolia due to her condition. She said that she was diagnosed with Angiosarcoma and [facility] dismissed her in November because they said there was nothing else that they could do for her. She wanted Dr Alain Marion to be aware and see his thoughts on continuing Prolia at this time. Please advise.

## 2019-01-18 NOTE — Telephone Encounter (Signed)
Please advise 

## 2019-01-19 NOTE — Telephone Encounter (Signed)
Pt notified and will call back to schedule

## 2019-01-19 NOTE — Telephone Encounter (Signed)
I'm sorry. If she is mobile - she can have this last shot done. It is optional. Thx

## 2019-01-22 DIAGNOSIS — F329 Major depressive disorder, single episode, unspecified: Secondary | ICD-10-CM | POA: Diagnosis not present

## 2019-01-22 DIAGNOSIS — C7989 Secondary malignant neoplasm of other specified sites: Secondary | ICD-10-CM | POA: Diagnosis not present

## 2019-01-22 DIAGNOSIS — C7801 Secondary malignant neoplasm of right lung: Secondary | ICD-10-CM | POA: Diagnosis not present

## 2019-01-22 DIAGNOSIS — J45909 Unspecified asthma, uncomplicated: Secondary | ICD-10-CM | POA: Diagnosis not present

## 2019-01-22 DIAGNOSIS — K219 Gastro-esophageal reflux disease without esophagitis: Secondary | ICD-10-CM | POA: Diagnosis not present

## 2019-01-22 DIAGNOSIS — I25709 Atherosclerosis of coronary artery bypass graft(s), unspecified, with unspecified angina pectoris: Secondary | ICD-10-CM | POA: Diagnosis not present

## 2019-01-22 DIAGNOSIS — C7802 Secondary malignant neoplasm of left lung: Secondary | ICD-10-CM | POA: Diagnosis not present

## 2019-01-22 DIAGNOSIS — M109 Gout, unspecified: Secondary | ICD-10-CM | POA: Diagnosis not present

## 2019-01-22 DIAGNOSIS — J302 Other seasonal allergic rhinitis: Secondary | ICD-10-CM | POA: Diagnosis not present

## 2019-01-22 DIAGNOSIS — R296 Repeated falls: Secondary | ICD-10-CM | POA: Diagnosis not present

## 2019-01-22 DIAGNOSIS — F419 Anxiety disorder, unspecified: Secondary | ICD-10-CM | POA: Diagnosis not present

## 2019-01-22 DIAGNOSIS — C490 Malignant neoplasm of connective and soft tissue of head, face and neck: Secondary | ICD-10-CM | POA: Diagnosis not present

## 2019-01-22 DIAGNOSIS — I252 Old myocardial infarction: Secondary | ICD-10-CM | POA: Diagnosis not present

## 2019-01-22 DIAGNOSIS — D630 Anemia in neoplastic disease: Secondary | ICD-10-CM | POA: Diagnosis not present

## 2019-01-22 DIAGNOSIS — Z8719 Personal history of other diseases of the digestive system: Secondary | ICD-10-CM | POA: Diagnosis not present

## 2019-01-22 DIAGNOSIS — N183 Chronic kidney disease, stage 3 unspecified: Secondary | ICD-10-CM | POA: Diagnosis not present

## 2019-01-22 DIAGNOSIS — G629 Polyneuropathy, unspecified: Secondary | ICD-10-CM | POA: Diagnosis not present

## 2019-01-22 DIAGNOSIS — E785 Hyperlipidemia, unspecified: Secondary | ICD-10-CM | POA: Diagnosis not present

## 2019-01-22 DIAGNOSIS — I129 Hypertensive chronic kidney disease with stage 1 through stage 4 chronic kidney disease, or unspecified chronic kidney disease: Secondary | ICD-10-CM | POA: Diagnosis not present

## 2019-01-25 DIAGNOSIS — C7802 Secondary malignant neoplasm of left lung: Secondary | ICD-10-CM | POA: Diagnosis not present

## 2019-01-25 DIAGNOSIS — C7989 Secondary malignant neoplasm of other specified sites: Secondary | ICD-10-CM | POA: Diagnosis not present

## 2019-01-25 DIAGNOSIS — I25709 Atherosclerosis of coronary artery bypass graft(s), unspecified, with unspecified angina pectoris: Secondary | ICD-10-CM | POA: Diagnosis not present

## 2019-01-25 DIAGNOSIS — I252 Old myocardial infarction: Secondary | ICD-10-CM | POA: Diagnosis not present

## 2019-01-25 DIAGNOSIS — C7801 Secondary malignant neoplasm of right lung: Secondary | ICD-10-CM | POA: Diagnosis not present

## 2019-01-25 DIAGNOSIS — C490 Malignant neoplasm of connective and soft tissue of head, face and neck: Secondary | ICD-10-CM | POA: Diagnosis not present

## 2019-01-25 NOTE — Telephone Encounter (Signed)
Pt called and stated that her cancer doctor states that she can not take prolia shot.

## 2019-01-26 DIAGNOSIS — C7802 Secondary malignant neoplasm of left lung: Secondary | ICD-10-CM | POA: Diagnosis not present

## 2019-01-26 DIAGNOSIS — I25709 Atherosclerosis of coronary artery bypass graft(s), unspecified, with unspecified angina pectoris: Secondary | ICD-10-CM | POA: Diagnosis not present

## 2019-01-26 DIAGNOSIS — C7801 Secondary malignant neoplasm of right lung: Secondary | ICD-10-CM | POA: Diagnosis not present

## 2019-01-26 DIAGNOSIS — C7989 Secondary malignant neoplasm of other specified sites: Secondary | ICD-10-CM | POA: Diagnosis not present

## 2019-01-26 DIAGNOSIS — I252 Old myocardial infarction: Secondary | ICD-10-CM | POA: Diagnosis not present

## 2019-01-26 DIAGNOSIS — C490 Malignant neoplasm of connective and soft tissue of head, face and neck: Secondary | ICD-10-CM | POA: Diagnosis not present

## 2019-01-27 DIAGNOSIS — C7802 Secondary malignant neoplasm of left lung: Secondary | ICD-10-CM | POA: Diagnosis not present

## 2019-01-27 DIAGNOSIS — C7801 Secondary malignant neoplasm of right lung: Secondary | ICD-10-CM | POA: Diagnosis not present

## 2019-01-27 DIAGNOSIS — I252 Old myocardial infarction: Secondary | ICD-10-CM | POA: Diagnosis not present

## 2019-01-27 DIAGNOSIS — C490 Malignant neoplasm of connective and soft tissue of head, face and neck: Secondary | ICD-10-CM | POA: Diagnosis not present

## 2019-01-27 DIAGNOSIS — I25709 Atherosclerosis of coronary artery bypass graft(s), unspecified, with unspecified angina pectoris: Secondary | ICD-10-CM | POA: Diagnosis not present

## 2019-01-27 DIAGNOSIS — C7989 Secondary malignant neoplasm of other specified sites: Secondary | ICD-10-CM | POA: Diagnosis not present

## 2019-01-30 DIAGNOSIS — C7801 Secondary malignant neoplasm of right lung: Secondary | ICD-10-CM | POA: Diagnosis not present

## 2019-01-30 DIAGNOSIS — C7989 Secondary malignant neoplasm of other specified sites: Secondary | ICD-10-CM | POA: Diagnosis not present

## 2019-01-30 DIAGNOSIS — I252 Old myocardial infarction: Secondary | ICD-10-CM | POA: Diagnosis not present

## 2019-01-30 DIAGNOSIS — I25709 Atherosclerosis of coronary artery bypass graft(s), unspecified, with unspecified angina pectoris: Secondary | ICD-10-CM | POA: Diagnosis not present

## 2019-01-30 DIAGNOSIS — C490 Malignant neoplasm of connective and soft tissue of head, face and neck: Secondary | ICD-10-CM | POA: Diagnosis not present

## 2019-01-30 DIAGNOSIS — C7802 Secondary malignant neoplasm of left lung: Secondary | ICD-10-CM | POA: Diagnosis not present

## 2019-02-01 ENCOUNTER — Ambulatory Visit: Payer: Medicare Other | Admitting: Podiatry

## 2019-02-02 DIAGNOSIS — C7989 Secondary malignant neoplasm of other specified sites: Secondary | ICD-10-CM | POA: Diagnosis not present

## 2019-02-02 DIAGNOSIS — C7802 Secondary malignant neoplasm of left lung: Secondary | ICD-10-CM | POA: Diagnosis not present

## 2019-02-02 DIAGNOSIS — I252 Old myocardial infarction: Secondary | ICD-10-CM | POA: Diagnosis not present

## 2019-02-02 DIAGNOSIS — I25709 Atherosclerosis of coronary artery bypass graft(s), unspecified, with unspecified angina pectoris: Secondary | ICD-10-CM | POA: Diagnosis not present

## 2019-02-02 DIAGNOSIS — C7801 Secondary malignant neoplasm of right lung: Secondary | ICD-10-CM | POA: Diagnosis not present

## 2019-02-02 DIAGNOSIS — C490 Malignant neoplasm of connective and soft tissue of head, face and neck: Secondary | ICD-10-CM | POA: Diagnosis not present

## 2019-02-09 DIAGNOSIS — C7802 Secondary malignant neoplasm of left lung: Secondary | ICD-10-CM | POA: Diagnosis not present

## 2019-02-09 DIAGNOSIS — C7989 Secondary malignant neoplasm of other specified sites: Secondary | ICD-10-CM | POA: Diagnosis not present

## 2019-02-09 DIAGNOSIS — I252 Old myocardial infarction: Secondary | ICD-10-CM | POA: Diagnosis not present

## 2019-02-09 DIAGNOSIS — C490 Malignant neoplasm of connective and soft tissue of head, face and neck: Secondary | ICD-10-CM | POA: Diagnosis not present

## 2019-02-09 DIAGNOSIS — C7801 Secondary malignant neoplasm of right lung: Secondary | ICD-10-CM | POA: Diagnosis not present

## 2019-02-09 DIAGNOSIS — I25709 Atherosclerosis of coronary artery bypass graft(s), unspecified, with unspecified angina pectoris: Secondary | ICD-10-CM | POA: Diagnosis not present

## 2019-02-16 DIAGNOSIS — I25709 Atherosclerosis of coronary artery bypass graft(s), unspecified, with unspecified angina pectoris: Secondary | ICD-10-CM | POA: Diagnosis not present

## 2019-02-16 DIAGNOSIS — I252 Old myocardial infarction: Secondary | ICD-10-CM | POA: Diagnosis not present

## 2019-02-16 DIAGNOSIS — C7989 Secondary malignant neoplasm of other specified sites: Secondary | ICD-10-CM | POA: Diagnosis not present

## 2019-02-16 DIAGNOSIS — C7801 Secondary malignant neoplasm of right lung: Secondary | ICD-10-CM | POA: Diagnosis not present

## 2019-02-16 DIAGNOSIS — C7802 Secondary malignant neoplasm of left lung: Secondary | ICD-10-CM | POA: Diagnosis not present

## 2019-02-16 DIAGNOSIS — C490 Malignant neoplasm of connective and soft tissue of head, face and neck: Secondary | ICD-10-CM | POA: Diagnosis not present

## 2019-02-19 ENCOUNTER — Ambulatory Visit: Payer: Medicare Other

## 2019-02-19 ENCOUNTER — Ambulatory Visit: Payer: Medicare Other | Admitting: Hematology

## 2019-02-19 ENCOUNTER — Other Ambulatory Visit: Payer: Medicare Other

## 2019-02-22 DIAGNOSIS — J45909 Unspecified asthma, uncomplicated: Secondary | ICD-10-CM | POA: Diagnosis not present

## 2019-02-22 DIAGNOSIS — C7802 Secondary malignant neoplasm of left lung: Secondary | ICD-10-CM | POA: Diagnosis not present

## 2019-02-22 DIAGNOSIS — F329 Major depressive disorder, single episode, unspecified: Secondary | ICD-10-CM | POA: Diagnosis not present

## 2019-02-22 DIAGNOSIS — N183 Chronic kidney disease, stage 3 unspecified: Secondary | ICD-10-CM | POA: Diagnosis not present

## 2019-02-22 DIAGNOSIS — M109 Gout, unspecified: Secondary | ICD-10-CM | POA: Diagnosis not present

## 2019-02-22 DIAGNOSIS — C7989 Secondary malignant neoplasm of other specified sites: Secondary | ICD-10-CM | POA: Diagnosis not present

## 2019-02-22 DIAGNOSIS — C7801 Secondary malignant neoplasm of right lung: Secondary | ICD-10-CM | POA: Diagnosis not present

## 2019-02-22 DIAGNOSIS — I252 Old myocardial infarction: Secondary | ICD-10-CM | POA: Diagnosis not present

## 2019-02-22 DIAGNOSIS — F419 Anxiety disorder, unspecified: Secondary | ICD-10-CM | POA: Diagnosis not present

## 2019-02-22 DIAGNOSIS — R296 Repeated falls: Secondary | ICD-10-CM | POA: Diagnosis not present

## 2019-02-22 DIAGNOSIS — I25709 Atherosclerosis of coronary artery bypass graft(s), unspecified, with unspecified angina pectoris: Secondary | ICD-10-CM | POA: Diagnosis not present

## 2019-02-22 DIAGNOSIS — I129 Hypertensive chronic kidney disease with stage 1 through stage 4 chronic kidney disease, or unspecified chronic kidney disease: Secondary | ICD-10-CM | POA: Diagnosis not present

## 2019-02-22 DIAGNOSIS — K219 Gastro-esophageal reflux disease without esophagitis: Secondary | ICD-10-CM | POA: Diagnosis not present

## 2019-02-22 DIAGNOSIS — Z8719 Personal history of other diseases of the digestive system: Secondary | ICD-10-CM | POA: Diagnosis not present

## 2019-02-22 DIAGNOSIS — E785 Hyperlipidemia, unspecified: Secondary | ICD-10-CM | POA: Diagnosis not present

## 2019-02-22 DIAGNOSIS — D630 Anemia in neoplastic disease: Secondary | ICD-10-CM | POA: Diagnosis not present

## 2019-02-22 DIAGNOSIS — J302 Other seasonal allergic rhinitis: Secondary | ICD-10-CM | POA: Diagnosis not present

## 2019-02-22 DIAGNOSIS — C490 Malignant neoplasm of connective and soft tissue of head, face and neck: Secondary | ICD-10-CM | POA: Diagnosis not present

## 2019-02-22 DIAGNOSIS — G629 Polyneuropathy, unspecified: Secondary | ICD-10-CM | POA: Diagnosis not present

## 2019-02-23 DIAGNOSIS — I25709 Atherosclerosis of coronary artery bypass graft(s), unspecified, with unspecified angina pectoris: Secondary | ICD-10-CM | POA: Diagnosis not present

## 2019-02-23 DIAGNOSIS — C7801 Secondary malignant neoplasm of right lung: Secondary | ICD-10-CM | POA: Diagnosis not present

## 2019-02-23 DIAGNOSIS — C490 Malignant neoplasm of connective and soft tissue of head, face and neck: Secondary | ICD-10-CM | POA: Diagnosis not present

## 2019-02-23 DIAGNOSIS — C7989 Secondary malignant neoplasm of other specified sites: Secondary | ICD-10-CM | POA: Diagnosis not present

## 2019-02-23 DIAGNOSIS — I252 Old myocardial infarction: Secondary | ICD-10-CM | POA: Diagnosis not present

## 2019-02-23 DIAGNOSIS — C7802 Secondary malignant neoplasm of left lung: Secondary | ICD-10-CM | POA: Diagnosis not present

## 2019-03-02 DIAGNOSIS — C7989 Secondary malignant neoplasm of other specified sites: Secondary | ICD-10-CM | POA: Diagnosis not present

## 2019-03-02 DIAGNOSIS — C490 Malignant neoplasm of connective and soft tissue of head, face and neck: Secondary | ICD-10-CM | POA: Diagnosis not present

## 2019-03-02 DIAGNOSIS — I252 Old myocardial infarction: Secondary | ICD-10-CM | POA: Diagnosis not present

## 2019-03-02 DIAGNOSIS — C7802 Secondary malignant neoplasm of left lung: Secondary | ICD-10-CM | POA: Diagnosis not present

## 2019-03-02 DIAGNOSIS — I25709 Atherosclerosis of coronary artery bypass graft(s), unspecified, with unspecified angina pectoris: Secondary | ICD-10-CM | POA: Diagnosis not present

## 2019-03-02 DIAGNOSIS — C7801 Secondary malignant neoplasm of right lung: Secondary | ICD-10-CM | POA: Diagnosis not present

## 2019-03-08 DIAGNOSIS — C7801 Secondary malignant neoplasm of right lung: Secondary | ICD-10-CM | POA: Diagnosis not present

## 2019-03-08 DIAGNOSIS — I25709 Atherosclerosis of coronary artery bypass graft(s), unspecified, with unspecified angina pectoris: Secondary | ICD-10-CM | POA: Diagnosis not present

## 2019-03-08 DIAGNOSIS — C7802 Secondary malignant neoplasm of left lung: Secondary | ICD-10-CM | POA: Diagnosis not present

## 2019-03-08 DIAGNOSIS — I252 Old myocardial infarction: Secondary | ICD-10-CM | POA: Diagnosis not present

## 2019-03-08 DIAGNOSIS — C7989 Secondary malignant neoplasm of other specified sites: Secondary | ICD-10-CM | POA: Diagnosis not present

## 2019-03-08 DIAGNOSIS — C490 Malignant neoplasm of connective and soft tissue of head, face and neck: Secondary | ICD-10-CM | POA: Diagnosis not present

## 2019-03-09 DIAGNOSIS — C490 Malignant neoplasm of connective and soft tissue of head, face and neck: Secondary | ICD-10-CM | POA: Diagnosis not present

## 2019-03-09 DIAGNOSIS — I25709 Atherosclerosis of coronary artery bypass graft(s), unspecified, with unspecified angina pectoris: Secondary | ICD-10-CM | POA: Diagnosis not present

## 2019-03-09 DIAGNOSIS — C7802 Secondary malignant neoplasm of left lung: Secondary | ICD-10-CM | POA: Diagnosis not present

## 2019-03-09 DIAGNOSIS — I252 Old myocardial infarction: Secondary | ICD-10-CM | POA: Diagnosis not present

## 2019-03-09 DIAGNOSIS — C7989 Secondary malignant neoplasm of other specified sites: Secondary | ICD-10-CM | POA: Diagnosis not present

## 2019-03-09 DIAGNOSIS — C7801 Secondary malignant neoplasm of right lung: Secondary | ICD-10-CM | POA: Diagnosis not present

## 2019-03-10 DIAGNOSIS — I25709 Atherosclerosis of coronary artery bypass graft(s), unspecified, with unspecified angina pectoris: Secondary | ICD-10-CM | POA: Diagnosis not present

## 2019-03-10 DIAGNOSIS — C7801 Secondary malignant neoplasm of right lung: Secondary | ICD-10-CM | POA: Diagnosis not present

## 2019-03-10 DIAGNOSIS — C490 Malignant neoplasm of connective and soft tissue of head, face and neck: Secondary | ICD-10-CM | POA: Diagnosis not present

## 2019-03-10 DIAGNOSIS — I252 Old myocardial infarction: Secondary | ICD-10-CM | POA: Diagnosis not present

## 2019-03-10 DIAGNOSIS — C7989 Secondary malignant neoplasm of other specified sites: Secondary | ICD-10-CM | POA: Diagnosis not present

## 2019-03-10 DIAGNOSIS — C7802 Secondary malignant neoplasm of left lung: Secondary | ICD-10-CM | POA: Diagnosis not present

## 2019-03-16 DIAGNOSIS — C7802 Secondary malignant neoplasm of left lung: Secondary | ICD-10-CM | POA: Diagnosis not present

## 2019-03-16 DIAGNOSIS — C7989 Secondary malignant neoplasm of other specified sites: Secondary | ICD-10-CM | POA: Diagnosis not present

## 2019-03-16 DIAGNOSIS — I25709 Atherosclerosis of coronary artery bypass graft(s), unspecified, with unspecified angina pectoris: Secondary | ICD-10-CM | POA: Diagnosis not present

## 2019-03-16 DIAGNOSIS — C490 Malignant neoplasm of connective and soft tissue of head, face and neck: Secondary | ICD-10-CM | POA: Diagnosis not present

## 2019-03-16 DIAGNOSIS — C7801 Secondary malignant neoplasm of right lung: Secondary | ICD-10-CM | POA: Diagnosis not present

## 2019-03-16 DIAGNOSIS — I252 Old myocardial infarction: Secondary | ICD-10-CM | POA: Diagnosis not present

## 2019-03-22 DIAGNOSIS — Z8719 Personal history of other diseases of the digestive system: Secondary | ICD-10-CM | POA: Diagnosis not present

## 2019-03-22 DIAGNOSIS — F329 Major depressive disorder, single episode, unspecified: Secondary | ICD-10-CM | POA: Diagnosis not present

## 2019-03-22 DIAGNOSIS — J302 Other seasonal allergic rhinitis: Secondary | ICD-10-CM | POA: Diagnosis not present

## 2019-03-22 DIAGNOSIS — I25709 Atherosclerosis of coronary artery bypass graft(s), unspecified, with unspecified angina pectoris: Secondary | ICD-10-CM | POA: Diagnosis not present

## 2019-03-22 DIAGNOSIS — C7801 Secondary malignant neoplasm of right lung: Secondary | ICD-10-CM | POA: Diagnosis not present

## 2019-03-22 DIAGNOSIS — N183 Chronic kidney disease, stage 3 unspecified: Secondary | ICD-10-CM | POA: Diagnosis not present

## 2019-03-22 DIAGNOSIS — C490 Malignant neoplasm of connective and soft tissue of head, face and neck: Secondary | ICD-10-CM | POA: Diagnosis not present

## 2019-03-22 DIAGNOSIS — D630 Anemia in neoplastic disease: Secondary | ICD-10-CM | POA: Diagnosis not present

## 2019-03-22 DIAGNOSIS — I252 Old myocardial infarction: Secondary | ICD-10-CM | POA: Diagnosis not present

## 2019-03-22 DIAGNOSIS — E785 Hyperlipidemia, unspecified: Secondary | ICD-10-CM | POA: Diagnosis not present

## 2019-03-22 DIAGNOSIS — C7802 Secondary malignant neoplasm of left lung: Secondary | ICD-10-CM | POA: Diagnosis not present

## 2019-03-22 DIAGNOSIS — C7989 Secondary malignant neoplasm of other specified sites: Secondary | ICD-10-CM | POA: Diagnosis not present

## 2019-03-22 DIAGNOSIS — I129 Hypertensive chronic kidney disease with stage 1 through stage 4 chronic kidney disease, or unspecified chronic kidney disease: Secondary | ICD-10-CM | POA: Diagnosis not present

## 2019-03-22 DIAGNOSIS — G629 Polyneuropathy, unspecified: Secondary | ICD-10-CM | POA: Diagnosis not present

## 2019-03-22 DIAGNOSIS — F419 Anxiety disorder, unspecified: Secondary | ICD-10-CM | POA: Diagnosis not present

## 2019-03-22 DIAGNOSIS — R296 Repeated falls: Secondary | ICD-10-CM | POA: Diagnosis not present

## 2019-03-22 DIAGNOSIS — J45909 Unspecified asthma, uncomplicated: Secondary | ICD-10-CM | POA: Diagnosis not present

## 2019-03-22 DIAGNOSIS — M109 Gout, unspecified: Secondary | ICD-10-CM | POA: Diagnosis not present

## 2019-03-22 DIAGNOSIS — K219 Gastro-esophageal reflux disease without esophagitis: Secondary | ICD-10-CM | POA: Diagnosis not present

## 2019-03-23 DIAGNOSIS — C7801 Secondary malignant neoplasm of right lung: Secondary | ICD-10-CM | POA: Diagnosis not present

## 2019-03-23 DIAGNOSIS — C7989 Secondary malignant neoplasm of other specified sites: Secondary | ICD-10-CM | POA: Diagnosis not present

## 2019-03-23 DIAGNOSIS — C490 Malignant neoplasm of connective and soft tissue of head, face and neck: Secondary | ICD-10-CM | POA: Diagnosis not present

## 2019-03-23 DIAGNOSIS — C7802 Secondary malignant neoplasm of left lung: Secondary | ICD-10-CM | POA: Diagnosis not present

## 2019-03-23 DIAGNOSIS — I25709 Atherosclerosis of coronary artery bypass graft(s), unspecified, with unspecified angina pectoris: Secondary | ICD-10-CM | POA: Diagnosis not present

## 2019-03-23 DIAGNOSIS — I252 Old myocardial infarction: Secondary | ICD-10-CM | POA: Diagnosis not present

## 2019-03-25 DIAGNOSIS — C7801 Secondary malignant neoplasm of right lung: Secondary | ICD-10-CM | POA: Diagnosis not present

## 2019-03-25 DIAGNOSIS — I252 Old myocardial infarction: Secondary | ICD-10-CM | POA: Diagnosis not present

## 2019-03-25 DIAGNOSIS — C7989 Secondary malignant neoplasm of other specified sites: Secondary | ICD-10-CM | POA: Diagnosis not present

## 2019-03-25 DIAGNOSIS — C7802 Secondary malignant neoplasm of left lung: Secondary | ICD-10-CM | POA: Diagnosis not present

## 2019-03-25 DIAGNOSIS — C490 Malignant neoplasm of connective and soft tissue of head, face and neck: Secondary | ICD-10-CM | POA: Diagnosis not present

## 2019-03-25 DIAGNOSIS — I25709 Atherosclerosis of coronary artery bypass graft(s), unspecified, with unspecified angina pectoris: Secondary | ICD-10-CM | POA: Diagnosis not present

## 2019-03-26 DIAGNOSIS — C7802 Secondary malignant neoplasm of left lung: Secondary | ICD-10-CM | POA: Diagnosis not present

## 2019-03-26 DIAGNOSIS — C490 Malignant neoplasm of connective and soft tissue of head, face and neck: Secondary | ICD-10-CM | POA: Diagnosis not present

## 2019-03-26 DIAGNOSIS — I25709 Atherosclerosis of coronary artery bypass graft(s), unspecified, with unspecified angina pectoris: Secondary | ICD-10-CM | POA: Diagnosis not present

## 2019-03-26 DIAGNOSIS — I252 Old myocardial infarction: Secondary | ICD-10-CM | POA: Diagnosis not present

## 2019-03-26 DIAGNOSIS — C7989 Secondary malignant neoplasm of other specified sites: Secondary | ICD-10-CM | POA: Diagnosis not present

## 2019-03-26 DIAGNOSIS — C7801 Secondary malignant neoplasm of right lung: Secondary | ICD-10-CM | POA: Diagnosis not present

## 2019-03-30 DIAGNOSIS — C7802 Secondary malignant neoplasm of left lung: Secondary | ICD-10-CM | POA: Diagnosis not present

## 2019-03-30 DIAGNOSIS — I25709 Atherosclerosis of coronary artery bypass graft(s), unspecified, with unspecified angina pectoris: Secondary | ICD-10-CM | POA: Diagnosis not present

## 2019-03-30 DIAGNOSIS — C7989 Secondary malignant neoplasm of other specified sites: Secondary | ICD-10-CM | POA: Diagnosis not present

## 2019-03-30 DIAGNOSIS — C7801 Secondary malignant neoplasm of right lung: Secondary | ICD-10-CM | POA: Diagnosis not present

## 2019-03-30 DIAGNOSIS — I252 Old myocardial infarction: Secondary | ICD-10-CM | POA: Diagnosis not present

## 2019-03-30 DIAGNOSIS — C490 Malignant neoplasm of connective and soft tissue of head, face and neck: Secondary | ICD-10-CM | POA: Diagnosis not present

## 2019-04-06 DIAGNOSIS — C7801 Secondary malignant neoplasm of right lung: Secondary | ICD-10-CM | POA: Diagnosis not present

## 2019-04-06 DIAGNOSIS — C490 Malignant neoplasm of connective and soft tissue of head, face and neck: Secondary | ICD-10-CM | POA: Diagnosis not present

## 2019-04-06 DIAGNOSIS — C7989 Secondary malignant neoplasm of other specified sites: Secondary | ICD-10-CM | POA: Diagnosis not present

## 2019-04-06 DIAGNOSIS — I25709 Atherosclerosis of coronary artery bypass graft(s), unspecified, with unspecified angina pectoris: Secondary | ICD-10-CM | POA: Diagnosis not present

## 2019-04-06 DIAGNOSIS — C7802 Secondary malignant neoplasm of left lung: Secondary | ICD-10-CM | POA: Diagnosis not present

## 2019-04-06 DIAGNOSIS — I252 Old myocardial infarction: Secondary | ICD-10-CM | POA: Diagnosis not present

## 2019-04-13 DIAGNOSIS — C7801 Secondary malignant neoplasm of right lung: Secondary | ICD-10-CM | POA: Diagnosis not present

## 2019-04-13 DIAGNOSIS — C490 Malignant neoplasm of connective and soft tissue of head, face and neck: Secondary | ICD-10-CM | POA: Diagnosis not present

## 2019-04-13 DIAGNOSIS — I25709 Atherosclerosis of coronary artery bypass graft(s), unspecified, with unspecified angina pectoris: Secondary | ICD-10-CM | POA: Diagnosis not present

## 2019-04-13 DIAGNOSIS — C7989 Secondary malignant neoplasm of other specified sites: Secondary | ICD-10-CM | POA: Diagnosis not present

## 2019-04-13 DIAGNOSIS — C7802 Secondary malignant neoplasm of left lung: Secondary | ICD-10-CM | POA: Diagnosis not present

## 2019-04-13 DIAGNOSIS — I252 Old myocardial infarction: Secondary | ICD-10-CM | POA: Diagnosis not present

## 2019-04-20 DIAGNOSIS — C7989 Secondary malignant neoplasm of other specified sites: Secondary | ICD-10-CM | POA: Diagnosis not present

## 2019-04-20 DIAGNOSIS — I252 Old myocardial infarction: Secondary | ICD-10-CM | POA: Diagnosis not present

## 2019-04-20 DIAGNOSIS — C7801 Secondary malignant neoplasm of right lung: Secondary | ICD-10-CM | POA: Diagnosis not present

## 2019-04-20 DIAGNOSIS — C490 Malignant neoplasm of connective and soft tissue of head, face and neck: Secondary | ICD-10-CM | POA: Diagnosis not present

## 2019-04-20 DIAGNOSIS — C7802 Secondary malignant neoplasm of left lung: Secondary | ICD-10-CM | POA: Diagnosis not present

## 2019-04-20 DIAGNOSIS — I25709 Atherosclerosis of coronary artery bypass graft(s), unspecified, with unspecified angina pectoris: Secondary | ICD-10-CM | POA: Diagnosis not present

## 2019-04-22 DIAGNOSIS — I129 Hypertensive chronic kidney disease with stage 1 through stage 4 chronic kidney disease, or unspecified chronic kidney disease: Secondary | ICD-10-CM | POA: Diagnosis not present

## 2019-04-22 DIAGNOSIS — D630 Anemia in neoplastic disease: Secondary | ICD-10-CM | POA: Diagnosis not present

## 2019-04-22 DIAGNOSIS — E785 Hyperlipidemia, unspecified: Secondary | ICD-10-CM | POA: Diagnosis not present

## 2019-04-22 DIAGNOSIS — F329 Major depressive disorder, single episode, unspecified: Secondary | ICD-10-CM | POA: Diagnosis not present

## 2019-04-22 DIAGNOSIS — F419 Anxiety disorder, unspecified: Secondary | ICD-10-CM | POA: Diagnosis not present

## 2019-04-22 DIAGNOSIS — C490 Malignant neoplasm of connective and soft tissue of head, face and neck: Secondary | ICD-10-CM | POA: Diagnosis not present

## 2019-04-22 DIAGNOSIS — G629 Polyneuropathy, unspecified: Secondary | ICD-10-CM | POA: Diagnosis not present

## 2019-04-22 DIAGNOSIS — J45909 Unspecified asthma, uncomplicated: Secondary | ICD-10-CM | POA: Diagnosis not present

## 2019-04-22 DIAGNOSIS — N183 Chronic kidney disease, stage 3 unspecified: Secondary | ICD-10-CM | POA: Diagnosis not present

## 2019-04-22 DIAGNOSIS — C7801 Secondary malignant neoplasm of right lung: Secondary | ICD-10-CM | POA: Diagnosis not present

## 2019-04-22 DIAGNOSIS — M109 Gout, unspecified: Secondary | ICD-10-CM | POA: Diagnosis not present

## 2019-04-22 DIAGNOSIS — C7989 Secondary malignant neoplasm of other specified sites: Secondary | ICD-10-CM | POA: Diagnosis not present

## 2019-04-22 DIAGNOSIS — C7802 Secondary malignant neoplasm of left lung: Secondary | ICD-10-CM | POA: Diagnosis not present

## 2019-04-22 DIAGNOSIS — J302 Other seasonal allergic rhinitis: Secondary | ICD-10-CM | POA: Diagnosis not present

## 2019-04-22 DIAGNOSIS — I25709 Atherosclerosis of coronary artery bypass graft(s), unspecified, with unspecified angina pectoris: Secondary | ICD-10-CM | POA: Diagnosis not present

## 2019-04-22 DIAGNOSIS — Z8719 Personal history of other diseases of the digestive system: Secondary | ICD-10-CM | POA: Diagnosis not present

## 2019-04-22 DIAGNOSIS — I252 Old myocardial infarction: Secondary | ICD-10-CM | POA: Diagnosis not present

## 2019-04-22 DIAGNOSIS — K219 Gastro-esophageal reflux disease without esophagitis: Secondary | ICD-10-CM | POA: Diagnosis not present

## 2019-04-22 DIAGNOSIS — R296 Repeated falls: Secondary | ICD-10-CM | POA: Diagnosis not present

## 2019-04-23 DIAGNOSIS — C7989 Secondary malignant neoplasm of other specified sites: Secondary | ICD-10-CM | POA: Diagnosis not present

## 2019-04-23 DIAGNOSIS — C7802 Secondary malignant neoplasm of left lung: Secondary | ICD-10-CM | POA: Diagnosis not present

## 2019-04-23 DIAGNOSIS — C7801 Secondary malignant neoplasm of right lung: Secondary | ICD-10-CM | POA: Diagnosis not present

## 2019-04-23 DIAGNOSIS — I25709 Atherosclerosis of coronary artery bypass graft(s), unspecified, with unspecified angina pectoris: Secondary | ICD-10-CM | POA: Diagnosis not present

## 2019-04-23 DIAGNOSIS — C490 Malignant neoplasm of connective and soft tissue of head, face and neck: Secondary | ICD-10-CM | POA: Diagnosis not present

## 2019-04-23 DIAGNOSIS — I252 Old myocardial infarction: Secondary | ICD-10-CM | POA: Diagnosis not present

## 2019-04-27 DIAGNOSIS — C490 Malignant neoplasm of connective and soft tissue of head, face and neck: Secondary | ICD-10-CM | POA: Diagnosis not present

## 2019-04-27 DIAGNOSIS — I25709 Atherosclerosis of coronary artery bypass graft(s), unspecified, with unspecified angina pectoris: Secondary | ICD-10-CM | POA: Diagnosis not present

## 2019-04-27 DIAGNOSIS — C7801 Secondary malignant neoplasm of right lung: Secondary | ICD-10-CM | POA: Diagnosis not present

## 2019-04-27 DIAGNOSIS — C7802 Secondary malignant neoplasm of left lung: Secondary | ICD-10-CM | POA: Diagnosis not present

## 2019-04-27 DIAGNOSIS — C7989 Secondary malignant neoplasm of other specified sites: Secondary | ICD-10-CM | POA: Diagnosis not present

## 2019-04-27 DIAGNOSIS — I252 Old myocardial infarction: Secondary | ICD-10-CM | POA: Diagnosis not present

## 2019-04-30 DIAGNOSIS — I25709 Atherosclerosis of coronary artery bypass graft(s), unspecified, with unspecified angina pectoris: Secondary | ICD-10-CM | POA: Diagnosis not present

## 2019-04-30 DIAGNOSIS — I252 Old myocardial infarction: Secondary | ICD-10-CM | POA: Diagnosis not present

## 2019-04-30 DIAGNOSIS — C7801 Secondary malignant neoplasm of right lung: Secondary | ICD-10-CM | POA: Diagnosis not present

## 2019-04-30 DIAGNOSIS — C7802 Secondary malignant neoplasm of left lung: Secondary | ICD-10-CM | POA: Diagnosis not present

## 2019-04-30 DIAGNOSIS — C7989 Secondary malignant neoplasm of other specified sites: Secondary | ICD-10-CM | POA: Diagnosis not present

## 2019-04-30 DIAGNOSIS — C490 Malignant neoplasm of connective and soft tissue of head, face and neck: Secondary | ICD-10-CM | POA: Diagnosis not present

## 2019-05-04 DIAGNOSIS — I25709 Atherosclerosis of coronary artery bypass graft(s), unspecified, with unspecified angina pectoris: Secondary | ICD-10-CM | POA: Diagnosis not present

## 2019-05-04 DIAGNOSIS — C7801 Secondary malignant neoplasm of right lung: Secondary | ICD-10-CM | POA: Diagnosis not present

## 2019-05-04 DIAGNOSIS — C490 Malignant neoplasm of connective and soft tissue of head, face and neck: Secondary | ICD-10-CM | POA: Diagnosis not present

## 2019-05-04 DIAGNOSIS — I252 Old myocardial infarction: Secondary | ICD-10-CM | POA: Diagnosis not present

## 2019-05-04 DIAGNOSIS — C7802 Secondary malignant neoplasm of left lung: Secondary | ICD-10-CM | POA: Diagnosis not present

## 2019-05-04 DIAGNOSIS — C7989 Secondary malignant neoplasm of other specified sites: Secondary | ICD-10-CM | POA: Diagnosis not present

## 2019-05-07 DIAGNOSIS — C7989 Secondary malignant neoplasm of other specified sites: Secondary | ICD-10-CM | POA: Diagnosis not present

## 2019-05-07 DIAGNOSIS — C490 Malignant neoplasm of connective and soft tissue of head, face and neck: Secondary | ICD-10-CM | POA: Diagnosis not present

## 2019-05-07 DIAGNOSIS — C7802 Secondary malignant neoplasm of left lung: Secondary | ICD-10-CM | POA: Diagnosis not present

## 2019-05-07 DIAGNOSIS — I252 Old myocardial infarction: Secondary | ICD-10-CM | POA: Diagnosis not present

## 2019-05-07 DIAGNOSIS — C7801 Secondary malignant neoplasm of right lung: Secondary | ICD-10-CM | POA: Diagnosis not present

## 2019-05-07 DIAGNOSIS — I25709 Atherosclerosis of coronary artery bypass graft(s), unspecified, with unspecified angina pectoris: Secondary | ICD-10-CM | POA: Diagnosis not present

## 2019-05-11 DIAGNOSIS — I252 Old myocardial infarction: Secondary | ICD-10-CM | POA: Diagnosis not present

## 2019-05-11 DIAGNOSIS — C490 Malignant neoplasm of connective and soft tissue of head, face and neck: Secondary | ICD-10-CM | POA: Diagnosis not present

## 2019-05-11 DIAGNOSIS — I25709 Atherosclerosis of coronary artery bypass graft(s), unspecified, with unspecified angina pectoris: Secondary | ICD-10-CM | POA: Diagnosis not present

## 2019-05-11 DIAGNOSIS — C7801 Secondary malignant neoplasm of right lung: Secondary | ICD-10-CM | POA: Diagnosis not present

## 2019-05-11 DIAGNOSIS — C7802 Secondary malignant neoplasm of left lung: Secondary | ICD-10-CM | POA: Diagnosis not present

## 2019-05-11 DIAGNOSIS — C7989 Secondary malignant neoplasm of other specified sites: Secondary | ICD-10-CM | POA: Diagnosis not present

## 2019-05-14 DIAGNOSIS — C7801 Secondary malignant neoplasm of right lung: Secondary | ICD-10-CM | POA: Diagnosis not present

## 2019-05-14 DIAGNOSIS — C490 Malignant neoplasm of connective and soft tissue of head, face and neck: Secondary | ICD-10-CM | POA: Diagnosis not present

## 2019-05-14 DIAGNOSIS — I252 Old myocardial infarction: Secondary | ICD-10-CM | POA: Diagnosis not present

## 2019-05-14 DIAGNOSIS — C7989 Secondary malignant neoplasm of other specified sites: Secondary | ICD-10-CM | POA: Diagnosis not present

## 2019-05-14 DIAGNOSIS — C7802 Secondary malignant neoplasm of left lung: Secondary | ICD-10-CM | POA: Diagnosis not present

## 2019-05-14 DIAGNOSIS — I25709 Atherosclerosis of coronary artery bypass graft(s), unspecified, with unspecified angina pectoris: Secondary | ICD-10-CM | POA: Diagnosis not present

## 2019-05-17 DIAGNOSIS — C7802 Secondary malignant neoplasm of left lung: Secondary | ICD-10-CM | POA: Diagnosis not present

## 2019-05-17 DIAGNOSIS — C7801 Secondary malignant neoplasm of right lung: Secondary | ICD-10-CM | POA: Diagnosis not present

## 2019-05-17 DIAGNOSIS — C490 Malignant neoplasm of connective and soft tissue of head, face and neck: Secondary | ICD-10-CM | POA: Diagnosis not present

## 2019-05-17 DIAGNOSIS — C7989 Secondary malignant neoplasm of other specified sites: Secondary | ICD-10-CM | POA: Diagnosis not present

## 2019-05-17 DIAGNOSIS — I252 Old myocardial infarction: Secondary | ICD-10-CM | POA: Diagnosis not present

## 2019-05-17 DIAGNOSIS — I25709 Atherosclerosis of coronary artery bypass graft(s), unspecified, with unspecified angina pectoris: Secondary | ICD-10-CM | POA: Diagnosis not present

## 2019-05-18 DIAGNOSIS — C490 Malignant neoplasm of connective and soft tissue of head, face and neck: Secondary | ICD-10-CM | POA: Diagnosis not present

## 2019-05-18 DIAGNOSIS — I252 Old myocardial infarction: Secondary | ICD-10-CM | POA: Diagnosis not present

## 2019-05-18 DIAGNOSIS — C7802 Secondary malignant neoplasm of left lung: Secondary | ICD-10-CM | POA: Diagnosis not present

## 2019-05-18 DIAGNOSIS — I25709 Atherosclerosis of coronary artery bypass graft(s), unspecified, with unspecified angina pectoris: Secondary | ICD-10-CM | POA: Diagnosis not present

## 2019-05-18 DIAGNOSIS — C7989 Secondary malignant neoplasm of other specified sites: Secondary | ICD-10-CM | POA: Diagnosis not present

## 2019-05-18 DIAGNOSIS — C7801 Secondary malignant neoplasm of right lung: Secondary | ICD-10-CM | POA: Diagnosis not present

## 2019-05-19 DIAGNOSIS — C7801 Secondary malignant neoplasm of right lung: Secondary | ICD-10-CM | POA: Diagnosis not present

## 2019-05-19 DIAGNOSIS — I25709 Atherosclerosis of coronary artery bypass graft(s), unspecified, with unspecified angina pectoris: Secondary | ICD-10-CM | POA: Diagnosis not present

## 2019-05-19 DIAGNOSIS — C490 Malignant neoplasm of connective and soft tissue of head, face and neck: Secondary | ICD-10-CM | POA: Diagnosis not present

## 2019-05-19 DIAGNOSIS — C7989 Secondary malignant neoplasm of other specified sites: Secondary | ICD-10-CM | POA: Diagnosis not present

## 2019-05-19 DIAGNOSIS — I252 Old myocardial infarction: Secondary | ICD-10-CM | POA: Diagnosis not present

## 2019-05-19 DIAGNOSIS — C7802 Secondary malignant neoplasm of left lung: Secondary | ICD-10-CM | POA: Diagnosis not present

## 2019-05-20 DIAGNOSIS — I252 Old myocardial infarction: Secondary | ICD-10-CM | POA: Diagnosis not present

## 2019-05-20 DIAGNOSIS — C7989 Secondary malignant neoplasm of other specified sites: Secondary | ICD-10-CM | POA: Diagnosis not present

## 2019-05-20 DIAGNOSIS — C7801 Secondary malignant neoplasm of right lung: Secondary | ICD-10-CM | POA: Diagnosis not present

## 2019-05-20 DIAGNOSIS — C7802 Secondary malignant neoplasm of left lung: Secondary | ICD-10-CM | POA: Diagnosis not present

## 2019-05-20 DIAGNOSIS — C490 Malignant neoplasm of connective and soft tissue of head, face and neck: Secondary | ICD-10-CM | POA: Diagnosis not present

## 2019-05-20 DIAGNOSIS — I25709 Atherosclerosis of coronary artery bypass graft(s), unspecified, with unspecified angina pectoris: Secondary | ICD-10-CM | POA: Diagnosis not present

## 2019-05-21 DIAGNOSIS — I252 Old myocardial infarction: Secondary | ICD-10-CM | POA: Diagnosis not present

## 2019-05-21 DIAGNOSIS — I25709 Atherosclerosis of coronary artery bypass graft(s), unspecified, with unspecified angina pectoris: Secondary | ICD-10-CM | POA: Diagnosis not present

## 2019-05-21 DIAGNOSIS — C7989 Secondary malignant neoplasm of other specified sites: Secondary | ICD-10-CM | POA: Diagnosis not present

## 2019-05-21 DIAGNOSIS — C7801 Secondary malignant neoplasm of right lung: Secondary | ICD-10-CM | POA: Diagnosis not present

## 2019-05-21 DIAGNOSIS — C7802 Secondary malignant neoplasm of left lung: Secondary | ICD-10-CM | POA: Diagnosis not present

## 2019-05-21 DIAGNOSIS — C490 Malignant neoplasm of connective and soft tissue of head, face and neck: Secondary | ICD-10-CM | POA: Diagnosis not present

## 2019-05-22 DIAGNOSIS — K219 Gastro-esophageal reflux disease without esophagitis: Secondary | ICD-10-CM | POA: Diagnosis not present

## 2019-05-22 DIAGNOSIS — C7801 Secondary malignant neoplasm of right lung: Secondary | ICD-10-CM | POA: Diagnosis not present

## 2019-05-22 DIAGNOSIS — N183 Chronic kidney disease, stage 3 unspecified: Secondary | ICD-10-CM | POA: Diagnosis not present

## 2019-05-22 DIAGNOSIS — I129 Hypertensive chronic kidney disease with stage 1 through stage 4 chronic kidney disease, or unspecified chronic kidney disease: Secondary | ICD-10-CM | POA: Diagnosis not present

## 2019-05-22 DIAGNOSIS — I25709 Atherosclerosis of coronary artery bypass graft(s), unspecified, with unspecified angina pectoris: Secondary | ICD-10-CM | POA: Diagnosis not present

## 2019-05-22 DIAGNOSIS — Z8719 Personal history of other diseases of the digestive system: Secondary | ICD-10-CM | POA: Diagnosis not present

## 2019-05-22 DIAGNOSIS — M109 Gout, unspecified: Secondary | ICD-10-CM | POA: Diagnosis not present

## 2019-05-22 DIAGNOSIS — I252 Old myocardial infarction: Secondary | ICD-10-CM | POA: Diagnosis not present

## 2019-05-22 DIAGNOSIS — F419 Anxiety disorder, unspecified: Secondary | ICD-10-CM | POA: Diagnosis not present

## 2019-05-22 DIAGNOSIS — C7802 Secondary malignant neoplasm of left lung: Secondary | ICD-10-CM | POA: Diagnosis not present

## 2019-05-22 DIAGNOSIS — J45909 Unspecified asthma, uncomplicated: Secondary | ICD-10-CM | POA: Diagnosis not present

## 2019-05-22 DIAGNOSIS — G629 Polyneuropathy, unspecified: Secondary | ICD-10-CM | POA: Diagnosis not present

## 2019-05-22 DIAGNOSIS — F329 Major depressive disorder, single episode, unspecified: Secondary | ICD-10-CM | POA: Diagnosis not present

## 2019-05-22 DIAGNOSIS — R296 Repeated falls: Secondary | ICD-10-CM | POA: Diagnosis not present

## 2019-05-22 DIAGNOSIS — C490 Malignant neoplasm of connective and soft tissue of head, face and neck: Secondary | ICD-10-CM | POA: Diagnosis not present

## 2019-05-22 DIAGNOSIS — E785 Hyperlipidemia, unspecified: Secondary | ICD-10-CM | POA: Diagnosis not present

## 2019-05-22 DIAGNOSIS — D630 Anemia in neoplastic disease: Secondary | ICD-10-CM | POA: Diagnosis not present

## 2019-05-22 DIAGNOSIS — C7989 Secondary malignant neoplasm of other specified sites: Secondary | ICD-10-CM | POA: Diagnosis not present

## 2019-05-22 DIAGNOSIS — J302 Other seasonal allergic rhinitis: Secondary | ICD-10-CM | POA: Diagnosis not present

## 2019-05-25 DIAGNOSIS — C7802 Secondary malignant neoplasm of left lung: Secondary | ICD-10-CM | POA: Diagnosis not present

## 2019-05-25 DIAGNOSIS — I252 Old myocardial infarction: Secondary | ICD-10-CM | POA: Diagnosis not present

## 2019-05-25 DIAGNOSIS — C7989 Secondary malignant neoplasm of other specified sites: Secondary | ICD-10-CM | POA: Diagnosis not present

## 2019-05-25 DIAGNOSIS — C490 Malignant neoplasm of connective and soft tissue of head, face and neck: Secondary | ICD-10-CM | POA: Diagnosis not present

## 2019-05-25 DIAGNOSIS — C7801 Secondary malignant neoplasm of right lung: Secondary | ICD-10-CM | POA: Diagnosis not present

## 2019-05-25 DIAGNOSIS — I25709 Atherosclerosis of coronary artery bypass graft(s), unspecified, with unspecified angina pectoris: Secondary | ICD-10-CM | POA: Diagnosis not present

## 2019-05-28 DIAGNOSIS — C490 Malignant neoplasm of connective and soft tissue of head, face and neck: Secondary | ICD-10-CM | POA: Diagnosis not present

## 2019-05-28 DIAGNOSIS — I25709 Atherosclerosis of coronary artery bypass graft(s), unspecified, with unspecified angina pectoris: Secondary | ICD-10-CM | POA: Diagnosis not present

## 2019-05-28 DIAGNOSIS — I252 Old myocardial infarction: Secondary | ICD-10-CM | POA: Diagnosis not present

## 2019-05-28 DIAGNOSIS — C7989 Secondary malignant neoplasm of other specified sites: Secondary | ICD-10-CM | POA: Diagnosis not present

## 2019-05-28 DIAGNOSIS — C7801 Secondary malignant neoplasm of right lung: Secondary | ICD-10-CM | POA: Diagnosis not present

## 2019-05-28 DIAGNOSIS — C7802 Secondary malignant neoplasm of left lung: Secondary | ICD-10-CM | POA: Diagnosis not present

## 2019-05-31 DIAGNOSIS — C7989 Secondary malignant neoplasm of other specified sites: Secondary | ICD-10-CM | POA: Diagnosis not present

## 2019-05-31 DIAGNOSIS — C7802 Secondary malignant neoplasm of left lung: Secondary | ICD-10-CM | POA: Diagnosis not present

## 2019-05-31 DIAGNOSIS — C490 Malignant neoplasm of connective and soft tissue of head, face and neck: Secondary | ICD-10-CM | POA: Diagnosis not present

## 2019-05-31 DIAGNOSIS — I252 Old myocardial infarction: Secondary | ICD-10-CM | POA: Diagnosis not present

## 2019-05-31 DIAGNOSIS — C7801 Secondary malignant neoplasm of right lung: Secondary | ICD-10-CM | POA: Diagnosis not present

## 2019-05-31 DIAGNOSIS — I25709 Atherosclerosis of coronary artery bypass graft(s), unspecified, with unspecified angina pectoris: Secondary | ICD-10-CM | POA: Diagnosis not present

## 2019-06-01 DIAGNOSIS — C490 Malignant neoplasm of connective and soft tissue of head, face and neck: Secondary | ICD-10-CM | POA: Diagnosis not present

## 2019-06-01 DIAGNOSIS — I252 Old myocardial infarction: Secondary | ICD-10-CM | POA: Diagnosis not present

## 2019-06-01 DIAGNOSIS — C7802 Secondary malignant neoplasm of left lung: Secondary | ICD-10-CM | POA: Diagnosis not present

## 2019-06-01 DIAGNOSIS — C7801 Secondary malignant neoplasm of right lung: Secondary | ICD-10-CM | POA: Diagnosis not present

## 2019-06-01 DIAGNOSIS — C7989 Secondary malignant neoplasm of other specified sites: Secondary | ICD-10-CM | POA: Diagnosis not present

## 2019-06-01 DIAGNOSIS — I25709 Atherosclerosis of coronary artery bypass graft(s), unspecified, with unspecified angina pectoris: Secondary | ICD-10-CM | POA: Diagnosis not present

## 2019-06-03 DIAGNOSIS — C7802 Secondary malignant neoplasm of left lung: Secondary | ICD-10-CM | POA: Diagnosis not present

## 2019-06-03 DIAGNOSIS — C490 Malignant neoplasm of connective and soft tissue of head, face and neck: Secondary | ICD-10-CM | POA: Diagnosis not present

## 2019-06-03 DIAGNOSIS — I25709 Atherosclerosis of coronary artery bypass graft(s), unspecified, with unspecified angina pectoris: Secondary | ICD-10-CM | POA: Diagnosis not present

## 2019-06-03 DIAGNOSIS — C7989 Secondary malignant neoplasm of other specified sites: Secondary | ICD-10-CM | POA: Diagnosis not present

## 2019-06-03 DIAGNOSIS — C7801 Secondary malignant neoplasm of right lung: Secondary | ICD-10-CM | POA: Diagnosis not present

## 2019-06-03 DIAGNOSIS — I252 Old myocardial infarction: Secondary | ICD-10-CM | POA: Diagnosis not present

## 2019-06-05 DIAGNOSIS — I25709 Atherosclerosis of coronary artery bypass graft(s), unspecified, with unspecified angina pectoris: Secondary | ICD-10-CM | POA: Diagnosis not present

## 2019-06-05 DIAGNOSIS — C7989 Secondary malignant neoplasm of other specified sites: Secondary | ICD-10-CM | POA: Diagnosis not present

## 2019-06-05 DIAGNOSIS — C490 Malignant neoplasm of connective and soft tissue of head, face and neck: Secondary | ICD-10-CM | POA: Diagnosis not present

## 2019-06-05 DIAGNOSIS — I252 Old myocardial infarction: Secondary | ICD-10-CM | POA: Diagnosis not present

## 2019-06-05 DIAGNOSIS — C7801 Secondary malignant neoplasm of right lung: Secondary | ICD-10-CM | POA: Diagnosis not present

## 2019-06-05 DIAGNOSIS — C7802 Secondary malignant neoplasm of left lung: Secondary | ICD-10-CM | POA: Diagnosis not present

## 2019-06-06 DIAGNOSIS — C7989 Secondary malignant neoplasm of other specified sites: Secondary | ICD-10-CM | POA: Diagnosis not present

## 2019-06-06 DIAGNOSIS — I25709 Atherosclerosis of coronary artery bypass graft(s), unspecified, with unspecified angina pectoris: Secondary | ICD-10-CM | POA: Diagnosis not present

## 2019-06-06 DIAGNOSIS — C490 Malignant neoplasm of connective and soft tissue of head, face and neck: Secondary | ICD-10-CM | POA: Diagnosis not present

## 2019-06-06 DIAGNOSIS — C7801 Secondary malignant neoplasm of right lung: Secondary | ICD-10-CM | POA: Diagnosis not present

## 2019-06-06 DIAGNOSIS — C7802 Secondary malignant neoplasm of left lung: Secondary | ICD-10-CM | POA: Diagnosis not present

## 2019-06-06 DIAGNOSIS — I252 Old myocardial infarction: Secondary | ICD-10-CM | POA: Diagnosis not present

## 2019-06-08 DIAGNOSIS — I252 Old myocardial infarction: Secondary | ICD-10-CM | POA: Diagnosis not present

## 2019-06-08 DIAGNOSIS — C7801 Secondary malignant neoplasm of right lung: Secondary | ICD-10-CM | POA: Diagnosis not present

## 2019-06-08 DIAGNOSIS — C7989 Secondary malignant neoplasm of other specified sites: Secondary | ICD-10-CM | POA: Diagnosis not present

## 2019-06-08 DIAGNOSIS — C7802 Secondary malignant neoplasm of left lung: Secondary | ICD-10-CM | POA: Diagnosis not present

## 2019-06-08 DIAGNOSIS — C490 Malignant neoplasm of connective and soft tissue of head, face and neck: Secondary | ICD-10-CM | POA: Diagnosis not present

## 2019-06-08 DIAGNOSIS — I25709 Atherosclerosis of coronary artery bypass graft(s), unspecified, with unspecified angina pectoris: Secondary | ICD-10-CM | POA: Diagnosis not present

## 2019-06-10 DIAGNOSIS — C7801 Secondary malignant neoplasm of right lung: Secondary | ICD-10-CM | POA: Diagnosis not present

## 2019-06-10 DIAGNOSIS — I25709 Atherosclerosis of coronary artery bypass graft(s), unspecified, with unspecified angina pectoris: Secondary | ICD-10-CM | POA: Diagnosis not present

## 2019-06-10 DIAGNOSIS — I252 Old myocardial infarction: Secondary | ICD-10-CM | POA: Diagnosis not present

## 2019-06-10 DIAGNOSIS — C7989 Secondary malignant neoplasm of other specified sites: Secondary | ICD-10-CM | POA: Diagnosis not present

## 2019-06-10 DIAGNOSIS — C7802 Secondary malignant neoplasm of left lung: Secondary | ICD-10-CM | POA: Diagnosis not present

## 2019-06-10 DIAGNOSIS — C490 Malignant neoplasm of connective and soft tissue of head, face and neck: Secondary | ICD-10-CM | POA: Diagnosis not present

## 2019-06-14 DIAGNOSIS — C7989 Secondary malignant neoplasm of other specified sites: Secondary | ICD-10-CM | POA: Diagnosis not present

## 2019-06-14 DIAGNOSIS — I252 Old myocardial infarction: Secondary | ICD-10-CM | POA: Diagnosis not present

## 2019-06-14 DIAGNOSIS — C7802 Secondary malignant neoplasm of left lung: Secondary | ICD-10-CM | POA: Diagnosis not present

## 2019-06-14 DIAGNOSIS — C7801 Secondary malignant neoplasm of right lung: Secondary | ICD-10-CM | POA: Diagnosis not present

## 2019-06-14 DIAGNOSIS — C490 Malignant neoplasm of connective and soft tissue of head, face and neck: Secondary | ICD-10-CM | POA: Diagnosis not present

## 2019-06-14 DIAGNOSIS — I25709 Atherosclerosis of coronary artery bypass graft(s), unspecified, with unspecified angina pectoris: Secondary | ICD-10-CM | POA: Diagnosis not present

## 2019-06-15 DIAGNOSIS — C7801 Secondary malignant neoplasm of right lung: Secondary | ICD-10-CM | POA: Diagnosis not present

## 2019-06-15 DIAGNOSIS — C490 Malignant neoplasm of connective and soft tissue of head, face and neck: Secondary | ICD-10-CM | POA: Diagnosis not present

## 2019-06-15 DIAGNOSIS — C7802 Secondary malignant neoplasm of left lung: Secondary | ICD-10-CM | POA: Diagnosis not present

## 2019-06-15 DIAGNOSIS — I252 Old myocardial infarction: Secondary | ICD-10-CM | POA: Diagnosis not present

## 2019-06-15 DIAGNOSIS — C7989 Secondary malignant neoplasm of other specified sites: Secondary | ICD-10-CM | POA: Diagnosis not present

## 2019-06-15 DIAGNOSIS — I25709 Atherosclerosis of coronary artery bypass graft(s), unspecified, with unspecified angina pectoris: Secondary | ICD-10-CM | POA: Diagnosis not present

## 2019-06-16 ENCOUNTER — Other Ambulatory Visit: Payer: Self-pay | Admitting: Cardiovascular Disease

## 2019-06-16 ENCOUNTER — Other Ambulatory Visit: Payer: Self-pay | Admitting: Internal Medicine

## 2019-06-17 DIAGNOSIS — I25709 Atherosclerosis of coronary artery bypass graft(s), unspecified, with unspecified angina pectoris: Secondary | ICD-10-CM | POA: Diagnosis not present

## 2019-06-17 DIAGNOSIS — C7989 Secondary malignant neoplasm of other specified sites: Secondary | ICD-10-CM | POA: Diagnosis not present

## 2019-06-17 DIAGNOSIS — I252 Old myocardial infarction: Secondary | ICD-10-CM | POA: Diagnosis not present

## 2019-06-17 DIAGNOSIS — C7802 Secondary malignant neoplasm of left lung: Secondary | ICD-10-CM | POA: Diagnosis not present

## 2019-06-17 DIAGNOSIS — C490 Malignant neoplasm of connective and soft tissue of head, face and neck: Secondary | ICD-10-CM | POA: Diagnosis not present

## 2019-06-17 DIAGNOSIS — C7801 Secondary malignant neoplasm of right lung: Secondary | ICD-10-CM | POA: Diagnosis not present

## 2019-06-18 DIAGNOSIS — I25709 Atherosclerosis of coronary artery bypass graft(s), unspecified, with unspecified angina pectoris: Secondary | ICD-10-CM | POA: Diagnosis not present

## 2019-06-18 DIAGNOSIS — C7802 Secondary malignant neoplasm of left lung: Secondary | ICD-10-CM | POA: Diagnosis not present

## 2019-06-18 DIAGNOSIS — C7989 Secondary malignant neoplasm of other specified sites: Secondary | ICD-10-CM | POA: Diagnosis not present

## 2019-06-18 DIAGNOSIS — C490 Malignant neoplasm of connective and soft tissue of head, face and neck: Secondary | ICD-10-CM | POA: Diagnosis not present

## 2019-06-18 DIAGNOSIS — C7801 Secondary malignant neoplasm of right lung: Secondary | ICD-10-CM | POA: Diagnosis not present

## 2019-06-18 DIAGNOSIS — I252 Old myocardial infarction: Secondary | ICD-10-CM | POA: Diagnosis not present

## 2019-07-22 ENCOUNTER — Other Ambulatory Visit: Payer: Self-pay | Admitting: Cardiovascular Disease

## 2019-07-22 DIAGNOSIS — I129 Hypertensive chronic kidney disease with stage 1 through stage 4 chronic kidney disease, or unspecified chronic kidney disease: Secondary | ICD-10-CM | POA: Diagnosis not present

## 2019-07-22 DIAGNOSIS — C7801 Secondary malignant neoplasm of right lung: Secondary | ICD-10-CM | POA: Diagnosis not present

## 2019-07-22 DIAGNOSIS — C7989 Secondary malignant neoplasm of other specified sites: Secondary | ICD-10-CM | POA: Diagnosis not present

## 2019-07-22 DIAGNOSIS — F329 Major depressive disorder, single episode, unspecified: Secondary | ICD-10-CM | POA: Diagnosis not present

## 2019-07-22 DIAGNOSIS — R296 Repeated falls: Secondary | ICD-10-CM | POA: Diagnosis not present

## 2019-07-22 DIAGNOSIS — C7802 Secondary malignant neoplasm of left lung: Secondary | ICD-10-CM | POA: Diagnosis not present

## 2019-07-22 DIAGNOSIS — Z8719 Personal history of other diseases of the digestive system: Secondary | ICD-10-CM | POA: Diagnosis not present

## 2019-07-22 DIAGNOSIS — M109 Gout, unspecified: Secondary | ICD-10-CM | POA: Diagnosis not present

## 2019-07-22 DIAGNOSIS — F419 Anxiety disorder, unspecified: Secondary | ICD-10-CM | POA: Diagnosis not present

## 2019-07-22 DIAGNOSIS — I25709 Atherosclerosis of coronary artery bypass graft(s), unspecified, with unspecified angina pectoris: Secondary | ICD-10-CM | POA: Diagnosis not present

## 2019-07-22 DIAGNOSIS — J45909 Unspecified asthma, uncomplicated: Secondary | ICD-10-CM | POA: Diagnosis not present

## 2019-07-22 DIAGNOSIS — K219 Gastro-esophageal reflux disease without esophagitis: Secondary | ICD-10-CM | POA: Diagnosis not present

## 2019-07-22 DIAGNOSIS — I252 Old myocardial infarction: Secondary | ICD-10-CM | POA: Diagnosis not present

## 2019-07-22 DIAGNOSIS — D630 Anemia in neoplastic disease: Secondary | ICD-10-CM | POA: Diagnosis not present

## 2019-07-22 DIAGNOSIS — E785 Hyperlipidemia, unspecified: Secondary | ICD-10-CM | POA: Diagnosis not present

## 2019-07-22 DIAGNOSIS — C490 Malignant neoplasm of connective and soft tissue of head, face and neck: Secondary | ICD-10-CM | POA: Diagnosis not present

## 2019-07-22 DIAGNOSIS — N183 Chronic kidney disease, stage 3 unspecified: Secondary | ICD-10-CM | POA: Diagnosis not present

## 2019-07-22 DIAGNOSIS — J302 Other seasonal allergic rhinitis: Secondary | ICD-10-CM | POA: Diagnosis not present

## 2019-07-22 DIAGNOSIS — G629 Polyneuropathy, unspecified: Secondary | ICD-10-CM | POA: Diagnosis not present

## 2019-07-23 DIAGNOSIS — C7802 Secondary malignant neoplasm of left lung: Secondary | ICD-10-CM | POA: Diagnosis not present

## 2019-07-23 DIAGNOSIS — C7989 Secondary malignant neoplasm of other specified sites: Secondary | ICD-10-CM | POA: Diagnosis not present

## 2019-07-23 DIAGNOSIS — C7801 Secondary malignant neoplasm of right lung: Secondary | ICD-10-CM | POA: Diagnosis not present

## 2019-07-23 DIAGNOSIS — C490 Malignant neoplasm of connective and soft tissue of head, face and neck: Secondary | ICD-10-CM | POA: Diagnosis not present

## 2019-07-23 DIAGNOSIS — I252 Old myocardial infarction: Secondary | ICD-10-CM | POA: Diagnosis not present

## 2019-07-23 DIAGNOSIS — I25709 Atherosclerosis of coronary artery bypass graft(s), unspecified, with unspecified angina pectoris: Secondary | ICD-10-CM | POA: Diagnosis not present

## 2019-07-24 DIAGNOSIS — C7801 Secondary malignant neoplasm of right lung: Secondary | ICD-10-CM | POA: Diagnosis not present

## 2019-07-24 DIAGNOSIS — I25709 Atherosclerosis of coronary artery bypass graft(s), unspecified, with unspecified angina pectoris: Secondary | ICD-10-CM | POA: Diagnosis not present

## 2019-07-24 DIAGNOSIS — C7802 Secondary malignant neoplasm of left lung: Secondary | ICD-10-CM | POA: Diagnosis not present

## 2019-07-24 DIAGNOSIS — C490 Malignant neoplasm of connective and soft tissue of head, face and neck: Secondary | ICD-10-CM | POA: Diagnosis not present

## 2019-07-24 DIAGNOSIS — C7989 Secondary malignant neoplasm of other specified sites: Secondary | ICD-10-CM | POA: Diagnosis not present

## 2019-07-24 DIAGNOSIS — I252 Old myocardial infarction: Secondary | ICD-10-CM | POA: Diagnosis not present

## 2019-07-25 DIAGNOSIS — C7989 Secondary malignant neoplasm of other specified sites: Secondary | ICD-10-CM | POA: Diagnosis not present

## 2019-07-25 DIAGNOSIS — C7802 Secondary malignant neoplasm of left lung: Secondary | ICD-10-CM | POA: Diagnosis not present

## 2019-07-25 DIAGNOSIS — C7801 Secondary malignant neoplasm of right lung: Secondary | ICD-10-CM | POA: Diagnosis not present

## 2019-07-25 DIAGNOSIS — I25709 Atherosclerosis of coronary artery bypass graft(s), unspecified, with unspecified angina pectoris: Secondary | ICD-10-CM | POA: Diagnosis not present

## 2019-07-25 DIAGNOSIS — C490 Malignant neoplasm of connective and soft tissue of head, face and neck: Secondary | ICD-10-CM | POA: Diagnosis not present

## 2019-07-25 DIAGNOSIS — I252 Old myocardial infarction: Secondary | ICD-10-CM | POA: Diagnosis not present

## 2019-07-27 DIAGNOSIS — C490 Malignant neoplasm of connective and soft tissue of head, face and neck: Secondary | ICD-10-CM | POA: Diagnosis not present

## 2019-07-27 DIAGNOSIS — C7802 Secondary malignant neoplasm of left lung: Secondary | ICD-10-CM | POA: Diagnosis not present

## 2019-07-27 DIAGNOSIS — C7801 Secondary malignant neoplasm of right lung: Secondary | ICD-10-CM | POA: Diagnosis not present

## 2019-07-27 DIAGNOSIS — I25709 Atherosclerosis of coronary artery bypass graft(s), unspecified, with unspecified angina pectoris: Secondary | ICD-10-CM | POA: Diagnosis not present

## 2019-07-27 DIAGNOSIS — I252 Old myocardial infarction: Secondary | ICD-10-CM | POA: Diagnosis not present

## 2019-07-27 DIAGNOSIS — C7989 Secondary malignant neoplasm of other specified sites: Secondary | ICD-10-CM | POA: Diagnosis not present

## 2019-07-28 DIAGNOSIS — C7802 Secondary malignant neoplasm of left lung: Secondary | ICD-10-CM | POA: Diagnosis not present

## 2019-07-28 DIAGNOSIS — C7989 Secondary malignant neoplasm of other specified sites: Secondary | ICD-10-CM | POA: Diagnosis not present

## 2019-07-28 DIAGNOSIS — I25709 Atherosclerosis of coronary artery bypass graft(s), unspecified, with unspecified angina pectoris: Secondary | ICD-10-CM | POA: Diagnosis not present

## 2019-07-28 DIAGNOSIS — I252 Old myocardial infarction: Secondary | ICD-10-CM | POA: Diagnosis not present

## 2019-07-28 DIAGNOSIS — C7801 Secondary malignant neoplasm of right lung: Secondary | ICD-10-CM | POA: Diagnosis not present

## 2019-07-28 DIAGNOSIS — C490 Malignant neoplasm of connective and soft tissue of head, face and neck: Secondary | ICD-10-CM | POA: Diagnosis not present

## 2019-07-29 DIAGNOSIS — C7989 Secondary malignant neoplasm of other specified sites: Secondary | ICD-10-CM | POA: Diagnosis not present

## 2019-07-29 DIAGNOSIS — C490 Malignant neoplasm of connective and soft tissue of head, face and neck: Secondary | ICD-10-CM | POA: Diagnosis not present

## 2019-07-29 DIAGNOSIS — I252 Old myocardial infarction: Secondary | ICD-10-CM | POA: Diagnosis not present

## 2019-07-29 DIAGNOSIS — C7802 Secondary malignant neoplasm of left lung: Secondary | ICD-10-CM | POA: Diagnosis not present

## 2019-07-29 DIAGNOSIS — I25709 Atherosclerosis of coronary artery bypass graft(s), unspecified, with unspecified angina pectoris: Secondary | ICD-10-CM | POA: Diagnosis not present

## 2019-07-29 DIAGNOSIS — C7801 Secondary malignant neoplasm of right lung: Secondary | ICD-10-CM | POA: Diagnosis not present

## 2019-07-30 DIAGNOSIS — C490 Malignant neoplasm of connective and soft tissue of head, face and neck: Secondary | ICD-10-CM | POA: Diagnosis not present

## 2019-07-30 DIAGNOSIS — C7989 Secondary malignant neoplasm of other specified sites: Secondary | ICD-10-CM | POA: Diagnosis not present

## 2019-07-30 DIAGNOSIS — I252 Old myocardial infarction: Secondary | ICD-10-CM | POA: Diagnosis not present

## 2019-07-30 DIAGNOSIS — C7802 Secondary malignant neoplasm of left lung: Secondary | ICD-10-CM | POA: Diagnosis not present

## 2019-07-30 DIAGNOSIS — C7801 Secondary malignant neoplasm of right lung: Secondary | ICD-10-CM | POA: Diagnosis not present

## 2019-07-30 DIAGNOSIS — I25709 Atherosclerosis of coronary artery bypass graft(s), unspecified, with unspecified angina pectoris: Secondary | ICD-10-CM | POA: Diagnosis not present

## 2019-07-31 DIAGNOSIS — I25709 Atherosclerosis of coronary artery bypass graft(s), unspecified, with unspecified angina pectoris: Secondary | ICD-10-CM | POA: Diagnosis not present

## 2019-07-31 DIAGNOSIS — I252 Old myocardial infarction: Secondary | ICD-10-CM | POA: Diagnosis not present

## 2019-07-31 DIAGNOSIS — C7989 Secondary malignant neoplasm of other specified sites: Secondary | ICD-10-CM | POA: Diagnosis not present

## 2019-07-31 DIAGNOSIS — C490 Malignant neoplasm of connective and soft tissue of head, face and neck: Secondary | ICD-10-CM | POA: Diagnosis not present

## 2019-07-31 DIAGNOSIS — C7801 Secondary malignant neoplasm of right lung: Secondary | ICD-10-CM | POA: Diagnosis not present

## 2019-07-31 DIAGNOSIS — C7802 Secondary malignant neoplasm of left lung: Secondary | ICD-10-CM | POA: Diagnosis not present

## 2019-08-01 DIAGNOSIS — C7989 Secondary malignant neoplasm of other specified sites: Secondary | ICD-10-CM | POA: Diagnosis not present

## 2019-08-01 DIAGNOSIS — C490 Malignant neoplasm of connective and soft tissue of head, face and neck: Secondary | ICD-10-CM | POA: Diagnosis not present

## 2019-08-01 DIAGNOSIS — I252 Old myocardial infarction: Secondary | ICD-10-CM | POA: Diagnosis not present

## 2019-08-01 DIAGNOSIS — C7802 Secondary malignant neoplasm of left lung: Secondary | ICD-10-CM | POA: Diagnosis not present

## 2019-08-01 DIAGNOSIS — I25709 Atherosclerosis of coronary artery bypass graft(s), unspecified, with unspecified angina pectoris: Secondary | ICD-10-CM | POA: Diagnosis not present

## 2019-08-01 DIAGNOSIS — C7801 Secondary malignant neoplasm of right lung: Secondary | ICD-10-CM | POA: Diagnosis not present

## 2019-08-02 DIAGNOSIS — I252 Old myocardial infarction: Secondary | ICD-10-CM | POA: Diagnosis not present

## 2019-08-02 DIAGNOSIS — C490 Malignant neoplasm of connective and soft tissue of head, face and neck: Secondary | ICD-10-CM | POA: Diagnosis not present

## 2019-08-02 DIAGNOSIS — C7801 Secondary malignant neoplasm of right lung: Secondary | ICD-10-CM | POA: Diagnosis not present

## 2019-08-02 DIAGNOSIS — I25709 Atherosclerosis of coronary artery bypass graft(s), unspecified, with unspecified angina pectoris: Secondary | ICD-10-CM | POA: Diagnosis not present

## 2019-08-02 DIAGNOSIS — C7802 Secondary malignant neoplasm of left lung: Secondary | ICD-10-CM | POA: Diagnosis not present

## 2019-08-02 DIAGNOSIS — C7989 Secondary malignant neoplasm of other specified sites: Secondary | ICD-10-CM | POA: Diagnosis not present

## 2019-08-03 DIAGNOSIS — C7989 Secondary malignant neoplasm of other specified sites: Secondary | ICD-10-CM | POA: Diagnosis not present

## 2019-08-03 DIAGNOSIS — C490 Malignant neoplasm of connective and soft tissue of head, face and neck: Secondary | ICD-10-CM | POA: Diagnosis not present

## 2019-08-03 DIAGNOSIS — I252 Old myocardial infarction: Secondary | ICD-10-CM | POA: Diagnosis not present

## 2019-08-03 DIAGNOSIS — C7801 Secondary malignant neoplasm of right lung: Secondary | ICD-10-CM | POA: Diagnosis not present

## 2019-08-03 DIAGNOSIS — I25709 Atherosclerosis of coronary artery bypass graft(s), unspecified, with unspecified angina pectoris: Secondary | ICD-10-CM | POA: Diagnosis not present

## 2019-08-03 DIAGNOSIS — C7802 Secondary malignant neoplasm of left lung: Secondary | ICD-10-CM | POA: Diagnosis not present

## 2019-08-04 DIAGNOSIS — C490 Malignant neoplasm of connective and soft tissue of head, face and neck: Secondary | ICD-10-CM | POA: Diagnosis not present

## 2019-08-04 DIAGNOSIS — I252 Old myocardial infarction: Secondary | ICD-10-CM | POA: Diagnosis not present

## 2019-08-04 DIAGNOSIS — I25709 Atherosclerosis of coronary artery bypass graft(s), unspecified, with unspecified angina pectoris: Secondary | ICD-10-CM | POA: Diagnosis not present

## 2019-08-04 DIAGNOSIS — C7802 Secondary malignant neoplasm of left lung: Secondary | ICD-10-CM | POA: Diagnosis not present

## 2019-08-04 DIAGNOSIS — C7989 Secondary malignant neoplasm of other specified sites: Secondary | ICD-10-CM | POA: Diagnosis not present

## 2019-08-04 DIAGNOSIS — C7801 Secondary malignant neoplasm of right lung: Secondary | ICD-10-CM | POA: Diagnosis not present

## 2019-08-05 ENCOUNTER — Telehealth: Payer: Self-pay

## 2019-08-05 NOTE — Telephone Encounter (Signed)
Received fax notification from [facility] of Ms Kramlich's death on 08-06-19

## 2019-08-22 DEATH — deceased

## 2019-09-29 ENCOUNTER — Encounter (HOSPITAL_COMMUNITY): Payer: Medicare Other
